# Patient Record
Sex: Female | Born: 1955 | Race: Black or African American | Hispanic: No
Health system: Southern US, Community
[De-identification: ages and names within clinical notes are randomized; demographics above are authoritative.]

## PROBLEM LIST (undated history)

## (undated) DIAGNOSIS — R768 Other specified abnormal immunological findings in serum: Secondary | ICD-10-CM

## (undated) DIAGNOSIS — F1010 Alcohol abuse, uncomplicated: Secondary | ICD-10-CM

## (undated) DIAGNOSIS — C801 Malignant (primary) neoplasm, unspecified: Secondary | ICD-10-CM

## (undated) DIAGNOSIS — H269 Unspecified cataract: Secondary | ICD-10-CM

## (undated) DIAGNOSIS — N189 Chronic kidney disease, unspecified: Secondary | ICD-10-CM

## (undated) DIAGNOSIS — I10 Essential (primary) hypertension: Secondary | ICD-10-CM

## (undated) DIAGNOSIS — K7030 Alcoholic cirrhosis of liver without ascites: Secondary | ICD-10-CM

## (undated) HISTORY — DX: Chronic kidney disease, unspecified: N18.9

## (undated) HISTORY — DX: Unspecified cataract: H26.9

## (undated) HISTORY — DX: Other specified abnormal immunological findings in serum: R76.8

## (undated) HISTORY — DX: Alcoholic cirrhosis of liver without ascites: K70.30

## (undated) HISTORY — DX: Malignant (primary) neoplasm, unspecified: C80.1

---

## 2019-08-02 ENCOUNTER — Other Ambulatory Visit: Payer: Self-pay

## 2019-08-02 ENCOUNTER — Observation Stay (HOSPITAL_COMMUNITY): Payer: Self-pay

## 2019-08-02 ENCOUNTER — Inpatient Hospital Stay (HOSPITAL_COMMUNITY)
Admission: EM | Admit: 2019-08-02 | Discharge: 2019-08-04 | DRG: 308 | Disposition: A | Discharge status: Home / Self Care | Payer: Self-pay | Attending: Internal Medicine | Admitting: Internal Medicine

## 2019-08-02 ENCOUNTER — Encounter (HOSPITAL_COMMUNITY): Payer: Self-pay

## 2019-08-02 ENCOUNTER — Emergency Department (HOSPITAL_COMMUNITY): Payer: Self-pay

## 2019-08-02 DIAGNOSIS — R945 Abnormal results of liver function studies: Secondary | ICD-10-CM

## 2019-08-02 DIAGNOSIS — Z20822 Contact with and (suspected) exposure to covid-19: Secondary | ICD-10-CM | POA: Diagnosis present

## 2019-08-02 DIAGNOSIS — R739 Hyperglycemia, unspecified: Secondary | ICD-10-CM | POA: Diagnosis present

## 2019-08-02 DIAGNOSIS — D696 Thrombocytopenia, unspecified: Secondary | ICD-10-CM | POA: Diagnosis present

## 2019-08-02 DIAGNOSIS — I10 Essential (primary) hypertension: Secondary | ICD-10-CM | POA: Diagnosis present

## 2019-08-02 DIAGNOSIS — B1920 Unspecified viral hepatitis C without hepatic coma: Secondary | ICD-10-CM | POA: Diagnosis present

## 2019-08-02 DIAGNOSIS — Z823 Family history of stroke: Secondary | ICD-10-CM

## 2019-08-02 DIAGNOSIS — K7031 Alcoholic cirrhosis of liver with ascites: Secondary | ICD-10-CM

## 2019-08-02 DIAGNOSIS — E162 Hypoglycemia, unspecified: Secondary | ICD-10-CM | POA: Diagnosis present

## 2019-08-02 DIAGNOSIS — F1010 Alcohol abuse, uncomplicated: Secondary | ICD-10-CM | POA: Diagnosis present

## 2019-08-02 DIAGNOSIS — Z9119 Patient's noncompliance with other medical treatment and regimen: Secondary | ICD-10-CM

## 2019-08-02 DIAGNOSIS — K920 Hematemesis: Secondary | ICD-10-CM

## 2019-08-02 DIAGNOSIS — R0602 Shortness of breath: Secondary | ICD-10-CM

## 2019-08-02 DIAGNOSIS — R791 Abnormal coagulation profile: Secondary | ICD-10-CM | POA: Diagnosis present

## 2019-08-02 DIAGNOSIS — E876 Hypokalemia: Secondary | ICD-10-CM | POA: Diagnosis present

## 2019-08-02 DIAGNOSIS — I480 Paroxysmal atrial fibrillation: Principal | ICD-10-CM | POA: Diagnosis present

## 2019-08-02 DIAGNOSIS — Z825 Family history of asthma and other chronic lower respiratory diseases: Secondary | ICD-10-CM

## 2019-08-02 DIAGNOSIS — Y907 Blood alcohol level of 200-239 mg/100 ml: Secondary | ICD-10-CM | POA: Diagnosis present

## 2019-08-02 DIAGNOSIS — E669 Obesity, unspecified: Secondary | ICD-10-CM | POA: Diagnosis present

## 2019-08-02 DIAGNOSIS — I4891 Unspecified atrial fibrillation: Secondary | ICD-10-CM | POA: Diagnosis present

## 2019-08-02 DIAGNOSIS — D7589 Other specified diseases of blood and blood-forming organs: Secondary | ICD-10-CM | POA: Diagnosis present

## 2019-08-02 DIAGNOSIS — K2211 Ulcer of esophagus with bleeding: Secondary | ICD-10-CM | POA: Diagnosis present

## 2019-08-02 DIAGNOSIS — R932 Abnormal findings on diagnostic imaging of liver and biliary tract: Secondary | ICD-10-CM

## 2019-08-02 DIAGNOSIS — Z91199 Patient's noncompliance with other medical treatment and regimen due to unspecified reason: Secondary | ICD-10-CM

## 2019-08-02 DIAGNOSIS — Z6836 Body mass index (BMI) 36.0-36.9, adult: Secondary | ICD-10-CM

## 2019-08-02 DIAGNOSIS — R203 Hyperesthesia: Secondary | ICD-10-CM | POA: Diagnosis present

## 2019-08-02 DIAGNOSIS — N179 Acute kidney failure, unspecified: Secondary | ICD-10-CM | POA: Diagnosis present

## 2019-08-02 DIAGNOSIS — K2210 Ulcer of esophagus without bleeding: Secondary | ICD-10-CM

## 2019-08-02 DIAGNOSIS — R160 Hepatomegaly, not elsewhere classified: Secondary | ICD-10-CM | POA: Diagnosis present

## 2019-08-02 DIAGNOSIS — R768 Other specified abnormal immunological findings in serum: Secondary | ICD-10-CM

## 2019-08-02 DIAGNOSIS — K7010 Alcoholic hepatitis without ascites: Secondary | ICD-10-CM | POA: Diagnosis present

## 2019-08-02 DIAGNOSIS — K2961 Other gastritis with bleeding: Secondary | ICD-10-CM | POA: Diagnosis present

## 2019-08-02 DIAGNOSIS — K2960 Other gastritis without bleeding: Secondary | ICD-10-CM

## 2019-08-02 DIAGNOSIS — F17210 Nicotine dependence, cigarettes, uncomplicated: Secondary | ICD-10-CM | POA: Diagnosis present

## 2019-08-02 DIAGNOSIS — Z801 Family history of malignant neoplasm of trachea, bronchus and lung: Secondary | ICD-10-CM

## 2019-08-02 DIAGNOSIS — R7989 Other specified abnormal findings of blood chemistry: Secondary | ICD-10-CM

## 2019-08-02 DIAGNOSIS — I248 Other forms of acute ischemic heart disease: Secondary | ICD-10-CM | POA: Diagnosis present

## 2019-08-02 DIAGNOSIS — R748 Abnormal levels of other serum enzymes: Secondary | ICD-10-CM

## 2019-08-02 DIAGNOSIS — K7030 Alcoholic cirrhosis of liver without ascites: Secondary | ICD-10-CM | POA: Diagnosis present

## 2019-08-02 DIAGNOSIS — K2981 Duodenitis with bleeding: Secondary | ICD-10-CM | POA: Diagnosis present

## 2019-08-02 HISTORY — DX: Essential (primary) hypertension: I10

## 2019-08-02 HISTORY — DX: Alcohol abuse, uncomplicated: F10.10

## 2019-08-02 HISTORY — DX: Unspecified atrial fibrillation: I48.91

## 2019-08-02 LAB — CBC
HCT: 43.7 % (ref 36.0–46.0)
HCT: 44 % (ref 36.0–46.0)
HCT: 36.9 % (ref 36.0–46.0)
Hemoglobin: 14.8 g/dL (ref 12.0–15.0)
Hemoglobin: 14.5 g/dL (ref 12.0–15.0)
Hemoglobin: 12.8 g/dL (ref 12.0–15.0)
MCH: 33.8 pg (ref 26.0–34.0)
MCH: 34.4 pg — ABNORMAL HIGH (ref 26.0–34.0)
MCH: 33.5 pg (ref 26.0–34.0)
MCHC: 33.9 g/dL (ref 30.0–36.0)
MCHC: 33 g/dL (ref 30.0–36.0)
MCHC: 34.7 g/dL (ref 30.0–36.0)
MCV: 99.8 fL (ref 80.0–100.0)
MCV: 104.5 fL — ABNORMAL HIGH (ref 80.0–100.0)
MCV: 96.6 fL (ref 80.0–100.0)
Platelets: DECREASED 10*3/uL (ref 150–400)
Platelets: 91 10*3/uL — ABNORMAL LOW (ref 150–400)
Platelets: 72 10*3/uL — ABNORMAL LOW (ref 150–400)
RBC: 4.38 MIL/uL (ref 3.87–5.11)
RBC: 4.21 MIL/uL (ref 3.87–5.11)
RBC: 3.82 MIL/uL — ABNORMAL LOW (ref 3.87–5.11)
RDW: 15.7 % — ABNORMAL HIGH (ref 11.5–15.5)
RDW: 16.5 % — ABNORMAL HIGH (ref 11.5–15.5)
RDW: 15.6 % — ABNORMAL HIGH (ref 11.5–15.5)
WBC: 6.8 10*3/uL (ref 4.0–10.5)
WBC: 5.5 10*3/uL (ref 4.0–10.5)
WBC: 6.3 10*3/uL (ref 4.0–10.5)
nRBC: 0 % (ref 0.0–0.2)
nRBC: 0 % (ref 0.0–0.2)
nRBC: 0 % (ref 0.0–0.2)

## 2019-08-02 LAB — CBG MONITORING, ED
Glucose-Capillary: 31 mg/dL — CL (ref 70–99)
Glucose-Capillary: 106 mg/dL — ABNORMAL HIGH (ref 70–99)
Glucose-Capillary: 161 mg/dL — ABNORMAL HIGH (ref 70–99)
Glucose-Capillary: 42 mg/dL — CL (ref 70–99)
Glucose-Capillary: 89 mg/dL (ref 70–99)

## 2019-08-02 LAB — DIFFERENTIAL
Abs Immature Granulocytes: 0.01 10*3/uL (ref 0.00–0.07)
Basophils Absolute: 0 10*3/uL (ref 0.0–0.1)
Basophils Relative: 0 %
Eosinophils Absolute: 0.1 10*3/uL (ref 0.0–0.5)
Eosinophils Relative: 1 %
Immature Granulocytes: 0 %
Lymphocytes Relative: 63 %
Lymphs Abs: 3.4 10*3/uL (ref 0.7–4.0)
Monocytes Absolute: 0.4 10*3/uL (ref 0.1–1.0)
Monocytes Relative: 8 %
Neutro Abs: 1.5 10*3/uL — ABNORMAL LOW (ref 1.7–7.7)
Neutrophils Relative %: 28 %

## 2019-08-02 LAB — MAGNESIUM: Magnesium: 2 mg/dL (ref 1.7–2.4)

## 2019-08-02 LAB — TROPONIN I (HIGH SENSITIVITY)
Troponin I (High Sensitivity): 33 ng/L — ABNORMAL HIGH (ref <18)
Troponin I (High Sensitivity): 50 ng/L — ABNORMAL HIGH (ref <18)

## 2019-08-02 LAB — HEPATIC FUNCTION PANEL
ALT: 122 U/L — ABNORMAL HIGH (ref 0–44)
AST: 220 U/L — ABNORMAL HIGH (ref 15–41)
Albumin: 3.3 g/dL — ABNORMAL LOW (ref 3.5–5.0)
Alkaline Phosphatase: 129 U/L — ABNORMAL HIGH (ref 38–126)
Bilirubin, Direct: 0.6 mg/dL — ABNORMAL HIGH (ref 0.0–0.2)
Indirect Bilirubin: 0.4 mg/dL (ref 0.3–0.9)
Total Bilirubin: 1 mg/dL (ref 0.3–1.2)
Total Protein: 7.1 g/dL (ref 6.5–8.1)

## 2019-08-02 LAB — BASIC METABOLIC PANEL
Anion gap: 20 — ABNORMAL HIGH (ref 5–15)
BUN: 15 mg/dL (ref 8–23)
CO2: 21 mmol/L — ABNORMAL LOW (ref 22–32)
Calcium: 9.4 mg/dL (ref 8.9–10.3)
Chloride: 100 mmol/L (ref 98–111)
Creatinine, Ser: 1.72 mg/dL — ABNORMAL HIGH (ref 0.44–1.00)
GFR calc Af Amer: 36 mL/min — ABNORMAL LOW (ref >60)
GFR calc non Af Amer: 31 mL/min — ABNORMAL LOW (ref >60)
Glucose, Bld: 44 mg/dL — CL (ref 70–99)
Potassium: 3 mmol/L — ABNORMAL LOW (ref 3.5–5.1)
Sodium: 141 mmol/L (ref 135–145)

## 2019-08-02 LAB — ECHOCARDIOGRAM COMPLETE
Height: 67 in
Weight: 3667.2 oz

## 2019-08-02 LAB — HEMOGLOBIN A1C
Hgb A1c MFr Bld: 5.6 % (ref 4.8–5.6)
Mean Plasma Glucose: 114.02 mg/dL

## 2019-08-02 LAB — PROTIME-INR
INR: 1.7 — ABNORMAL HIGH (ref 0.8–1.2)
Prothrombin Time: 19.8 seconds — ABNORMAL HIGH (ref 11.4–15.2)

## 2019-08-02 LAB — BRAIN NATRIURETIC PEPTIDE: B Natriuretic Peptide: 58.4 pg/mL (ref 0.0–100.0)

## 2019-08-02 LAB — RESPIRATORY PANEL BY RT PCR (FLU A&B, COVID)
Influenza A by PCR: NEGATIVE
Influenza B by PCR: NEGATIVE
SARS Coronavirus 2 by RT PCR: NEGATIVE

## 2019-08-02 LAB — HEPATITIS PANEL, ACUTE
HCV Ab: REACTIVE — AB
Hep A IgM: NONREACTIVE
Hep B C IgM: NONREACTIVE
Hepatitis B Surface Ag: NONREACTIVE

## 2019-08-02 LAB — ACETAMINOPHEN LEVEL: Acetaminophen (Tylenol), Serum: <10 ug/mL — ABNORMAL LOW (ref 10–30)

## 2019-08-02 LAB — TSH: TSH: 0.82 u[IU]/mL (ref 0.350–4.500)

## 2019-08-02 LAB — ETHANOL: Alcohol, Ethyl (B): 235 mg/dL — ABNORMAL HIGH (ref <10)

## 2019-08-02 LAB — HIV ANTIBODY (ROUTINE TESTING W REFLEX): HIV Screen 4th Generation wRfx: NONREACTIVE

## 2019-08-02 IMAGING — US US ABDOMEN LIMITED
1 series · 14 of 23 positions shown · non-contrast
Comparison: None.

CLINICAL DATA: Elevated LFTs

EXAM:
ULTRASOUND ABDOMEN LIMITED RIGHT UPPER QUADRANT

[Series 1: us abdomen limited ruq · 14 of 23 slices shown]
[im 1/23]
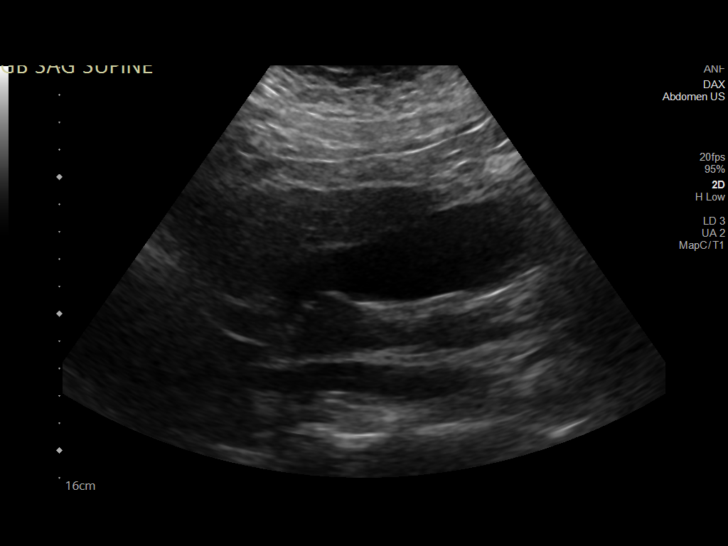
[im 3/23]
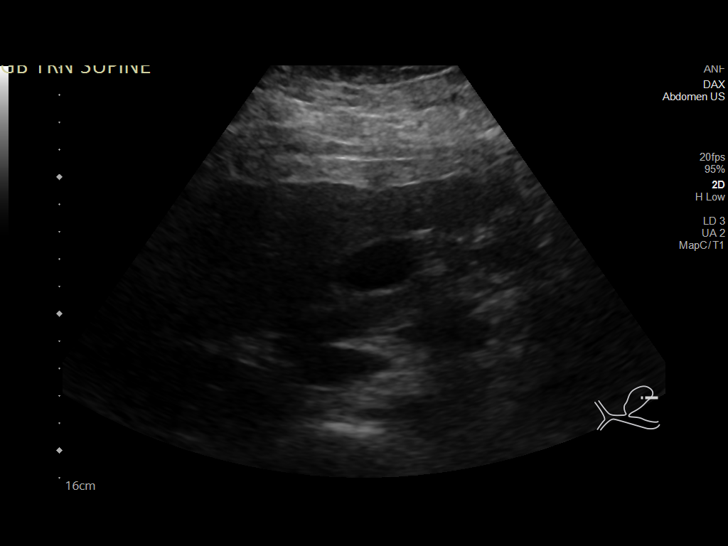
[im 5/23]
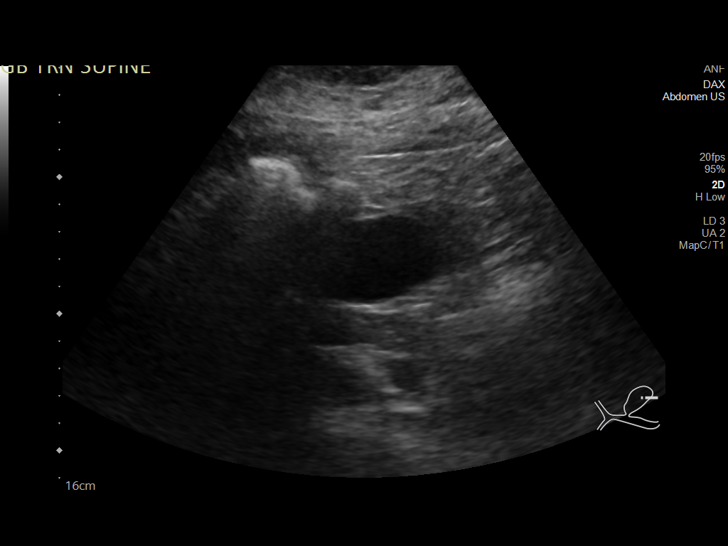
[im 6/23]
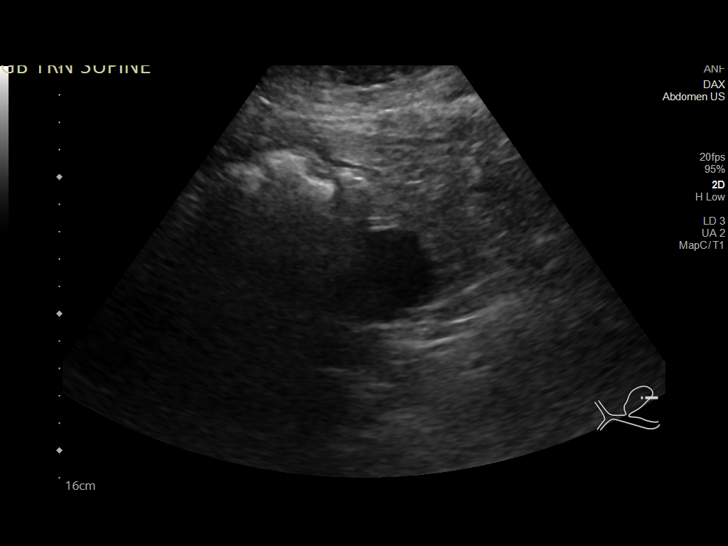
[im 8/23]
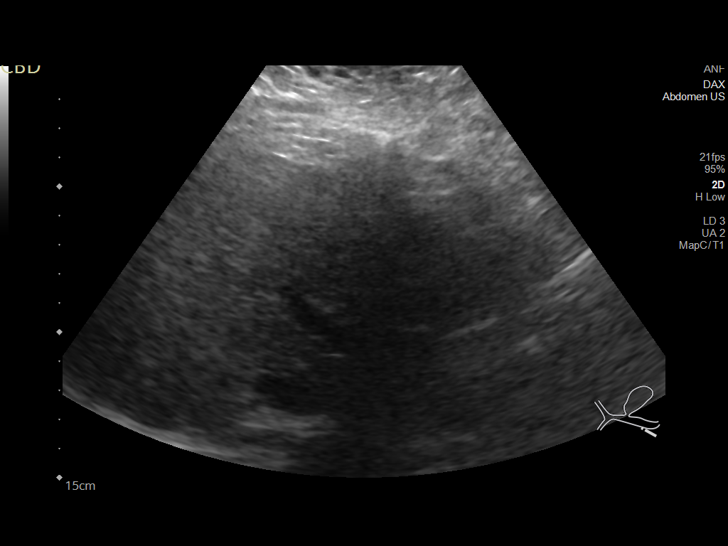
[im 10/23]
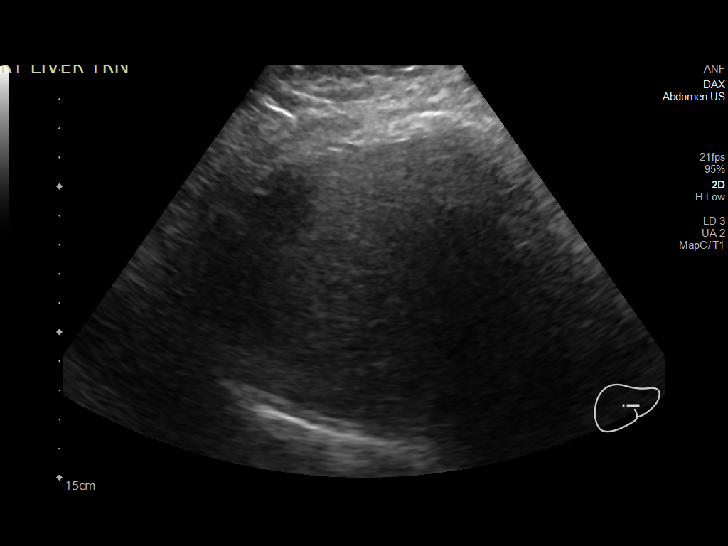
[im 11/23]
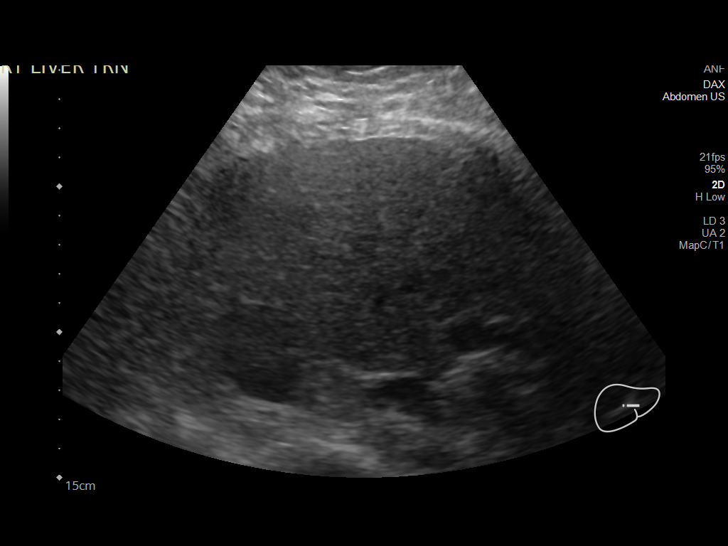
[im 13/23]
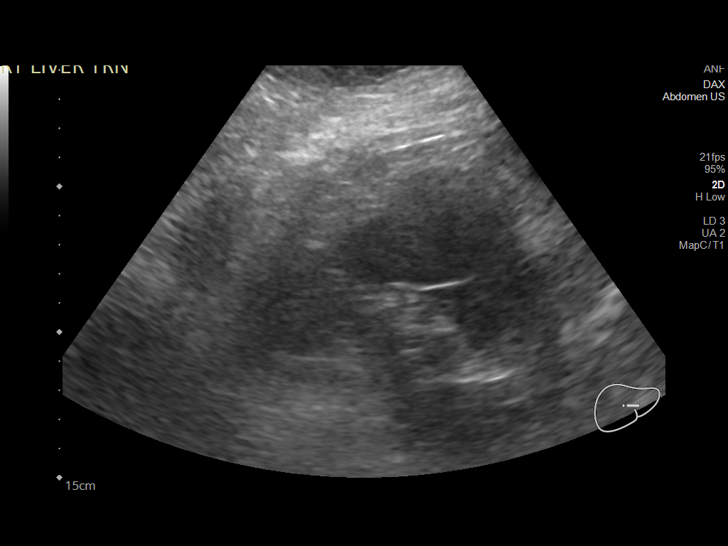
[im 14/23]
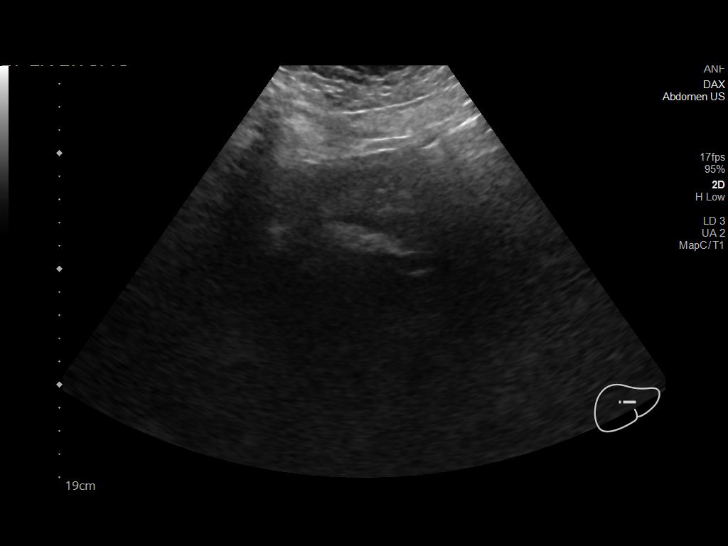
[im 16/23]
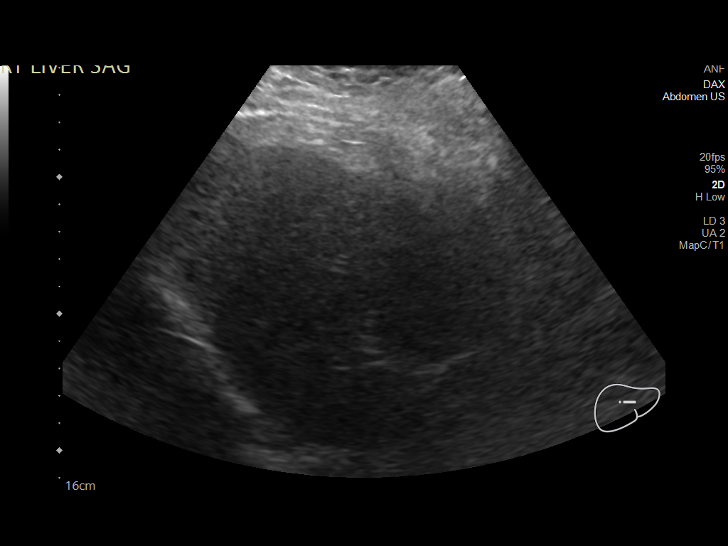
[im 18/23]
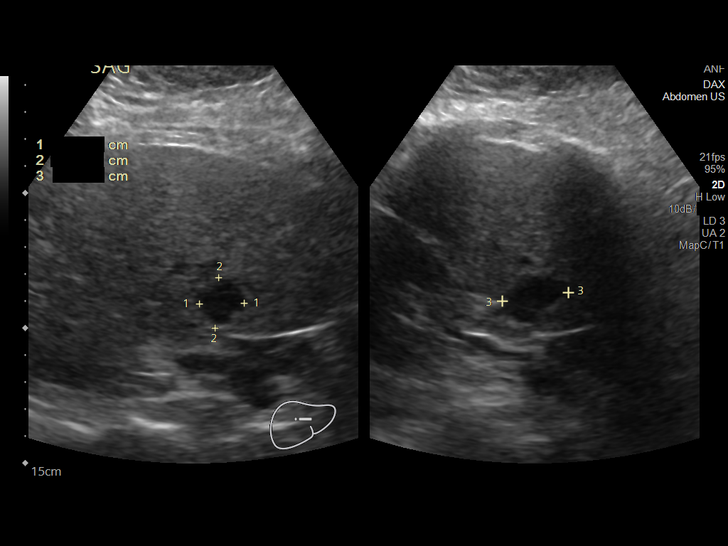
[im 19/23]
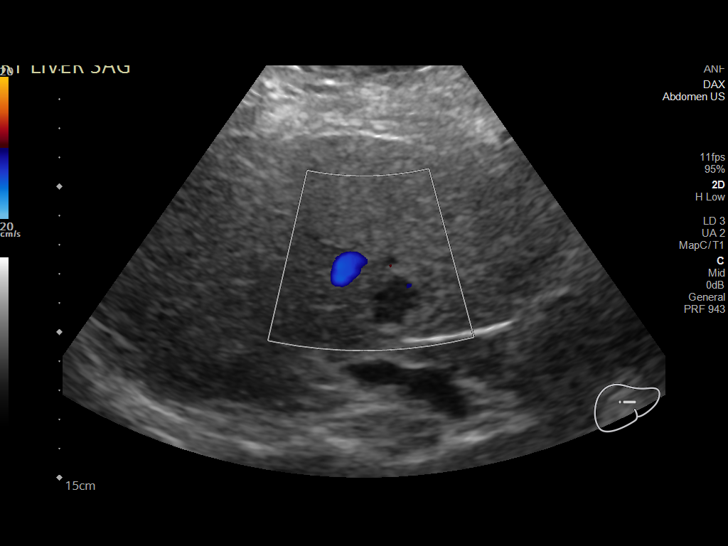
[im 21/23]
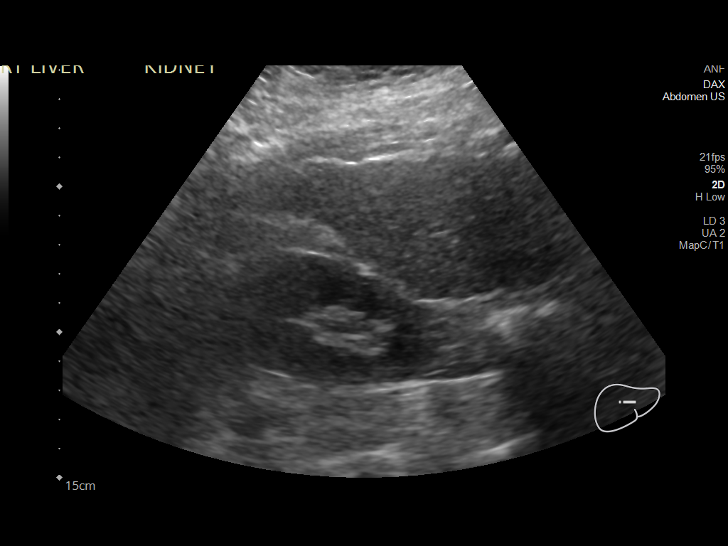
[im 23/23]
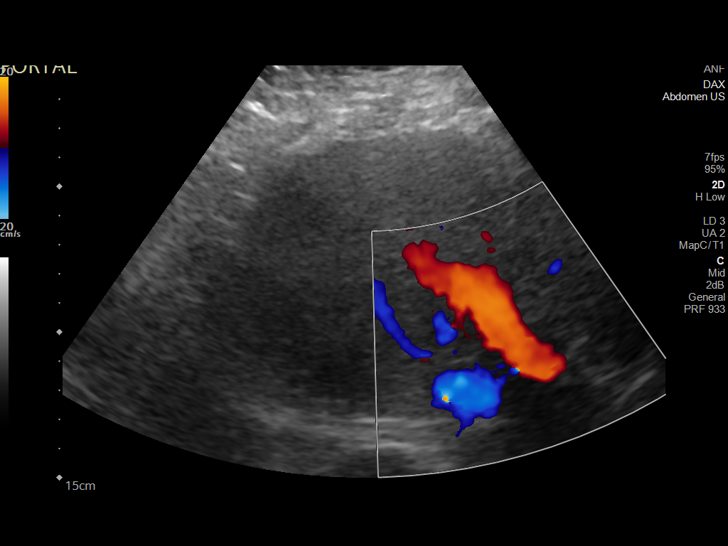

[14 of 23 positions shown; findings below may reference images not displayed]

FINDINGS: Gallbladder:

No gallstones or wall thickening visualized. No sonographic Murphy
sign noted by sonographer.

Common bile duct:

Diameter: Not visualized due to body habitus, difficulty positioning
the patient common overlying bowel gas

Liver:

Heterogeneous increased liver echogenicity. Central liver hypoechoic
indeterminate lesion noted measuring 1.7 x 1.9 x 2.5 cm
adjacent/anterior to the porta hepatis. This lesion may be cystic
but difficult to confirm with this suboptimal ultrasound. Liver
surface does appear nodular suggesting cirrhosis. No intrahepatic
biliary dilatation.

Portal vein is patent on color Doppler imaging with normal direction
of blood flow towards the liver.

Other: No ascites or free fluid. Included views of the right kidney
demonstrate no hydronephrosis.
IMPRESSION: No gallbladder abnormality, cholelithiasis or biliary obstruction

Suboptimal exam as above.  Underlying hepatic cirrhosis suspected.

2.5 cm indeterminate central hypoechoic hepatic lesion. See above
comment.

## 2019-08-02 MED ORDER — SODIUM CHLORIDE 0.9 % IV BOLUS
500.0000 mL | Freq: Once | INTRAVENOUS | Status: AC
  Administered 2019-08-02: 500 mL via INTRAVENOUS

## 2019-08-02 MED ORDER — PANTOPRAZOLE SODIUM 40 MG IV SOLR: 40.0000 mg | Freq: Two times a day (BID) | INTRAVENOUS | Status: DC

## 2019-08-02 MED ORDER — LORAZEPAM 2 MG/ML IJ SOLN
1.0000 mg | INTRAMUSCULAR | Status: DC | PRN
  Administered 2019-08-03: 1 mg via INTRAVENOUS
  Filled 2019-08-02: qty 1

## 2019-08-02 MED ORDER — DILTIAZEM HCL-DEXTROSE 125-5 MG/125ML-% IV SOLN (PREMIX)
5.0000 mg/h | INTRAVENOUS | Status: DC
  Administered 2019-08-02 – 2019-08-03 (×3): 5 mg/h via INTRAVENOUS
  Filled 2019-08-02 (×2): qty 125

## 2019-08-02 MED ORDER — ACETAMINOPHEN 325 MG PO TABS: 325.0000 mg | ORAL_TABLET | Freq: Four times a day (QID) | ORAL | Status: DC | PRN

## 2019-08-02 MED ORDER — LORAZEPAM 1 MG PO TABS: 1.0000 mg | ORAL_TABLET | ORAL | Status: DC | PRN

## 2019-08-02 MED ORDER — SODIUM CHLORIDE 0.9 % IV SOLN
8.0000 mg/h | INTRAVENOUS | Status: DC
  Administered 2019-08-02 – 2019-08-03 (×3): 8 mg/h via INTRAVENOUS
  Filled 2019-08-02 (×4): qty 80

## 2019-08-02 MED ORDER — ONDANSETRON HCL 4 MG PO TABS: 4.0000 mg | ORAL_TABLET | Freq: Four times a day (QID) | ORAL | Status: DC | PRN

## 2019-08-02 MED ORDER — OCTREOTIDE LOAD VIA INFUSION
50.0000 ug | Freq: Once | INTRAVENOUS | Status: AC
  Administered 2019-08-02: 50 ug via INTRAVENOUS
  Filled 2019-08-02: qty 25

## 2019-08-02 MED ORDER — HEPARIN BOLUS VIA INFUSION
4000.0000 [IU] | Freq: Once | INTRAVENOUS | Status: AC
  Administered 2019-08-02: 4000 [IU] via INTRAVENOUS
  Filled 2019-08-02: qty 4000

## 2019-08-02 MED ORDER — SODIUM CHLORIDE 0.9 % IV SOLN
1.0000 g | INTRAVENOUS | Status: DC
  Administered 2019-08-02: 1 g via INTRAVENOUS
  Filled 2019-08-02: qty 10

## 2019-08-02 MED ORDER — KCL IN DEXTROSE-NACL 40-5-0.9 MEQ/L-%-% IV SOLN
INTRAVENOUS | Status: DC
  Filled 2019-08-02 (×3): qty 1000

## 2019-08-02 MED ORDER — PANTOPRAZOLE SODIUM 40 MG IV SOLR
40.0000 mg | Freq: Once | INTRAVENOUS | Status: AC
  Administered 2019-08-02: 40 mg via INTRAVENOUS
  Filled 2019-08-02: qty 40

## 2019-08-02 MED ORDER — DEXTROSE 50 % IV SOLN
INTRAVENOUS | Status: AC
  Administered 2019-08-02: 50 mL
  Filled 2019-08-02: qty 50

## 2019-08-02 MED ORDER — THIAMINE HCL 100 MG PO TABS
100.0000 mg | ORAL_TABLET | Freq: Every day | ORAL | Status: DC
  Administered 2019-08-03 – 2019-08-04 (×2): 100 mg via ORAL
  Filled 2019-08-02 (×2): qty 1

## 2019-08-02 MED ORDER — FOLIC ACID 1 MG PO TABS
1.0000 mg | ORAL_TABLET | Freq: Every day | ORAL | Status: DC
  Administered 2019-08-03 – 2019-08-04 (×2): 1 mg via ORAL
  Filled 2019-08-02 (×2): qty 1

## 2019-08-02 MED ORDER — HEPARIN (PORCINE) 25000 UT/250ML-% IV SOLN
1200.0000 [IU]/h | INTRAVENOUS | Status: DC
  Administered 2019-08-02: 1200 [IU]/h via INTRAVENOUS
  Filled 2019-08-02: qty 250

## 2019-08-02 MED ORDER — DEXTROSE 50 % IV SOLN
25.0000 g | Freq: Once | INTRAVENOUS | Status: AC
  Administered 2019-08-02: 25 g via INTRAVENOUS
  Filled 2019-08-02: qty 50

## 2019-08-02 MED ORDER — SODIUM CHLORIDE 0.9% FLUSH
3.0000 mL | Freq: Two times a day (BID) | INTRAVENOUS | Status: DC
  Administered 2019-08-02: 3 mL via INTRAVENOUS

## 2019-08-02 MED ORDER — SODIUM CHLORIDE 0.9 % IV BOLUS (SEPSIS)
500.0000 mL | Freq: Once | INTRAVENOUS | Status: AC
  Administered 2019-08-02: 500 mL via INTRAVENOUS

## 2019-08-02 MED ORDER — MAGNESIUM SULFATE 2 GM/50ML IV SOLN
2.0000 g | Freq: Once | INTRAVENOUS | Status: AC
  Administered 2019-08-02: 2 g via INTRAVENOUS
  Filled 2019-08-02: qty 50

## 2019-08-02 MED ORDER — SODIUM CHLORIDE 0.9% FLUSH
3.0000 mL | Freq: Once | INTRAVENOUS | Status: AC
  Administered 2019-08-02: 3 mL via INTRAVENOUS

## 2019-08-02 MED ORDER — DILTIAZEM LOAD VIA INFUSION
10.0000 mg | Freq: Once | INTRAVENOUS | Status: AC
  Administered 2019-08-02: 10 mg via INTRAVENOUS
  Filled 2019-08-02: qty 10

## 2019-08-02 MED ORDER — ONDANSETRON HCL 4 MG/2ML IJ SOLN
4.0000 mg | Freq: Four times a day (QID) | INTRAMUSCULAR | Status: DC | PRN
  Filled 2019-08-02: qty 2

## 2019-08-02 MED ORDER — SENNOSIDES-DOCUSATE SODIUM 8.6-50 MG PO TABS: 1.0000 | ORAL_TABLET | Freq: Every evening | ORAL | Status: DC | PRN

## 2019-08-02 MED ORDER — ACETAMINOPHEN 650 MG RE SUPP: 325.0000 mg | Freq: Four times a day (QID) | RECTAL | Status: DC | PRN

## 2019-08-02 MED ORDER — POTASSIUM CHLORIDE CRYS ER 20 MEQ PO TBCR
40.0000 meq | EXTENDED_RELEASE_TABLET | Freq: Once | ORAL | Status: AC
  Administered 2019-08-02: 40 meq via ORAL
  Filled 2019-08-02: qty 2

## 2019-08-02 MED ORDER — SODIUM CHLORIDE 0.9 % IV SOLN
50.0000 ug/h | INTRAVENOUS | Status: DC
  Administered 2019-08-02 – 2019-08-03 (×3): 50 ug/h via INTRAVENOUS
  Filled 2019-08-02 (×2): qty 1

## 2019-08-02 MED ORDER — POTASSIUM CHLORIDE 10 MEQ/100ML IV SOLN
10.0000 meq | Freq: Once | INTRAVENOUS | Status: AC
  Administered 2019-08-02: 10 meq via INTRAVENOUS
  Filled 2019-08-02: qty 100

## 2019-08-02 MED ORDER — ADULT MULTIVITAMIN W/MINERALS CH
1.0000 | ORAL_TABLET | Freq: Every day | ORAL | Status: DC
  Administered 2019-08-03 – 2019-08-04 (×2): 1 via ORAL
  Filled 2019-08-02 (×2): qty 1

## 2019-08-02 NOTE — ED Notes (Signed)
500 ml of nss bolus at present

## 2019-08-02 NOTE — ED Triage Notes (Signed)
Pt arrives to ED with c/o of "I can't breathe, I can't catch my breath";  Pt stated SOB started 6 days ago; pt is from [facility] and just arrived to [place] 2 days ago visiting daughter:Pt denies being around Covid person but she is unsure;  Pt was not forth coming with information and was having some flight of ideas at triage; Pt went from one subject to another; Pt c/o chest pain after questioning a few times; Pt is tearful at [place] and was difficult to get answers from at [facility]

## 2019-08-02 NOTE — Progress Notes (Addendum)
After reviewing the cardiology consult note, we discovered that the patient had vomited blood while in the ED after we initially evaluated the patient.  The primary team did not receive a page, and were not notified about this event.  We have discontinued the heparin and made the patient NPO. Starting octreotide. Will reach out to GI for further recommendations.  ADDENDUM: Spoke with GI, they recommend starting ceftriaxone in addition to octreotide. They will come see the patient as well.  Earlene Plater, MD Internal Medicine, PGY1 Pager: [phone]  08/02/2019,4:31 PM

## 2019-08-02 NOTE — Hospital Course (Signed)
In summary, Connie [name] is a 64 year old female with history of untreated hypertension, tobacco use and suspected EtOH use disorder who presents with SOB, chest pain and palpitations.  She was noted to be in A. fib with RVR. On initial labs, it was noted that she had an elevated EtOH, transaminitis, and elevated PT/INR, raising concerns for hepatic injury secondary to alcohol use. Pt responded well to cardizem drip and returned to sinus rhythm.

## 2019-08-02 NOTE — Progress Notes (Signed)
*  PRELIMINARY RESULTS* Echocardiogram 2D Echocardiogram has been performed.  Connie [name] 08/02/2019, 3:01 PM

## 2019-08-02 NOTE — ED Notes (Signed)
Connie [name] [phone] would like call when she is able to come back with patient

## 2019-08-02 NOTE — Consult Note (Addendum)
Consultation  Referring Provider:     Dr. Jerl Santos Primary Care Physician:  Patient, No Pcp Per Primary Gastroenterologist:       None - unassigned - visiting from [facility]  Reason for Consultation:     hematemesis     Impression / Plan:   Hematemesis  Cirrhosis - EtOH, ? 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  mildl INR elevation  AKI  Hypoglycemia  ++++++  Agree w/ PPI and octreotide  You have added CTX prophylaxis as suggested  Assuming no withdrawal or other issues will plan for EGD tomorrow - sooner if clinical course suggests  Serial Hgb  Agree no anti-coag Tx  Do not use steroids - discriminant fx is not high enough and she has cirrhosis not alcoholic hepatitis  Leave NPO  IVF ordered  Agree w/ w/d protocol - no hx of this but possible  Will need other liver imaging due to lesion - CT vs MR   Gatha Mayer, MD, [facility] 08/02/2019 6:14 PM           HPI:   Connie [name] is a 64 y.o. female admitted with Afib and RVR - then had hematemesis after starting heparin infusion. + EtOH level on admit at 235. Heparin was turned off. She drinks heavily at times at least and since admit we know [place] looks like cirrhoitic liver, PLT's low and INR out a bit.  No prior hx hematemeis. Denies NSAIDS Non-adherent to medical tx for htn "I do not like doctors or needles" No prior GI w/u  Resting comfortably now. Here in town to visit daughter, grandkids and greatgtrandchild. Says she has a stressful marriage  Some left chest pain and tenderness  Glucose was 44 - Txed  No family hx liver dz  Past Medical History:  Diagnosis Date  . Alcohol abuse   . Hypertension     History reviewed. No  pertinent surgical history.  Family History  Problem Relation Age of Onset  . Lung cancer Mother      Social History   Tobacco Use  . Smoking status: Current Every Day Smoker  Substance Use Topics  . Alcohol use: Yes  . Drug use: Not on file    Prior to Admission medications   Not on File    Current Facility-Administered Medications  Medication Dose Route Frequency Provider Last Rate Last Admin  . acetaminophen (TYLENOL) tablet 325 mg  325 mg Oral Q6H PRN Masoudi, Elhamalsadat, MD       Or  . acetaminophen (TYLENOL) suppository 325 mg  325 mg Rectal Q6H PRN Masoudi, Elhamalsadat, MD      . cefTRIAXone (ROCEPHIN) 1 g in sodium chloride 0.9 % 100 mL IVPB  1 g Intravenous Q24H Earlene Plater, MD      . diltiazem (CARDIZEM) 125 mg in dextrose 5% 125 mL (1 mg/mL) infusion  5-15 mg/hr Intravenous Continuous Charlesetta Shanks, MD 10 mL/hr at 08/02/19 1311 10 mg/hr at 08/02/19 1311  . folic acid (FOLVITE) tablet 1 mg  1 mg Oral Daily Masoudi, Elhamalsadat, MD      . LORazepam (ATIVAN) tablet 1-4 mg  1-4 mg Oral Q1H PRN Dewayne Hatch, MD  Or  . LORazepam (ATIVAN) injection 1-4 mg  1-4 mg Intravenous Q1H PRN Masoudi, Elhamalsadat, MD      . multivitamin with minerals tablet 1 tablet  1 tablet Oral Daily Masoudi, Elhamalsadat, MD      . octreotide (SANDOSTATIN) 2 mcg/mL load via infusion 50 mcg  50 mcg Intravenous Once Earlene Plater, MD       And  . octreotide (SANDOSTATIN) 500 mcg in sodium chloride 0.9 % 250 mL (2 mcg/mL) infusion  50 mcg/hr Intravenous Continuous Earlene Plater, MD      . ondansetron [facility]) tablet 4 mg  4 mg Oral Q6H PRN Masoudi, Elhamalsadat, MD       Or  . ondansetron (ZOFRAN) injection 4 mg  4 mg Intravenous Q6H PRN Masoudi, Elhamalsadat, MD      . pantoprazole (PROTONIX) 80 mg in sodium chloride 0.9 % 100 mL (0.8 mg/mL) infusion  8 mg/hr Intravenous Continuous Charlesetta Shanks, MD 10 mL/hr at 08/02/19 1228 8 mg/hr at 08/02/19 1228  . [START ON  08/06/2019] pantoprazole (PROTONIX) injection 40 mg  40 mg Intravenous Q12H Pfeiffer, Jeannie Done, MD      . senna-docusate (Senokot-S) tablet 1 tablet  1 tablet Oral QHS PRN Masoudi, Elhamalsadat, MD      . sodium chloride flush (NS) 0.9 % injection 3 mL  3 mL Intravenous Q12H Masoudi, Elhamalsadat, MD      . thiamine tablet 100 mg  100 mg Oral Daily Masoudi, Elhamalsadat, MD        Allergies as of 08/02/2019  . (No Known Allergies)     Review of Systems:    This is positive for those things mentioned in the HPI, also positive for palpitations and dyspnea    Physical Exam:  Vital signs in last 24 hours: Temp:  [97.6 F (36.4 C)-99.7 F (37.6 C)] 99.7 F (37.6 C) (04/03 1702) Pulse Rate:  [71-159] 82 (04/03 1643) Resp:  [13-23] 20 (04/03 1643) BP: (85-160)/(41-113) 127/57 (04/03 1643) SpO2:  [90 %-100 %] 94 % (04/03 1643) Weight:  [89.4 kg-104 kg] 104 kg (04/03 1343) Last BM Date: 08/02/19  General:  Well-developed, well-nourished and in no acute distress Eyes:  anicteric. Lungs: Clear to auscultation bilaterally. Heart:   S1S2, no rubs, murmurs, gallops. Abdomen:  mod obesesoft, non-tender, no hepatosplenomegaly, hernia, or mass and BS+.  Extremities:  Trace bilat edema Skin   no rash. Neuro:  A&O x 3. No asterixis Psych:  appropriate mood and  Affect.   Data Reviewed:   LAB RESULTS: Recent Labs    08/02/19 0638 08/02/19 0749  WBC 6.8 5.5  HGB 14.8 14.5  HCT 43.7 44.0  PLT PLATELET CLUMPS NOTED ON SMEAR, COUNT APPEARS DECREASED 91*   BMET Recent Labs    08/02/19 0638  NA 141  K 3.0*  CL 100  CO2 21*  GLUCOSE 44*  BUN 15  CREATININE 1.72*  CALCIUM 9.4   LFT Recent Labs    08/02/19 0749  PROT 7.1  ALBUMIN 3.3*  AST 220*  ALT 122*  ALKPHOS 129*  BILITOT 1.0  BILIDIR 0.6*  IBILI 0.4   PT/INR Recent Labs    08/02/19 0749  LABPROT 19.8*  INR 1.7*   Lab Results  Component Value Date   HEPAIGM NON REACTIVE 08/02/2019   NON REACTIVE (04/03  0749) HBSAg Lab Results  Component Value Date   HCVAB Reactive (A) 08/02/2019   Lab Results  Component Value Date   TSH 0.820 08/02/2019  STUDIES: DG Chest Port 1 View  Result Date: 08/02/2019 CLINICAL DATA:  Chest pain and shortness of breath for 1 day EXAM: PORTABLE CHEST 1 VIEW COMPARISON:  None. FINDINGS: Generous heart size but within normal limits for portable technique. Unremarkable aortic contours. There is no edema, consolidation, effusion, or pneumothorax. IMPRESSION: Negative portable chest. Electronically Signed   By: Monte Fantasia M.D.   On: 08/02/2019 07:30   ECHOCARDIOGRAM COMPLETE  Result Date: 08/02/2019    ECHOCARDIOGRAM REPORT   Patient Name:   Connie [name] Date of Exam: 08/02/2019 Medical Rec #:  [id number]      Height:       67.0 in Accession #:    [id number]     Weight:       229.2 lb Date of Birth:  Nov 16, 1955      BSA:          2.143 m Patient Age:    47 years       BP:           126/51 mmHg Patient Gender: F              HR:           77 bpm. Exam Location:  Inpatient Procedure: 2D Echo Indications:    Dyspnea 786.09 / R06.00  History:        Patient has no prior history of Echocardiogram examinations.                 Arrythmias:Atrial Fibrillation; Risk Factors:Current Smoker and                 Hypertension. ETOH.  Sonographer:    Leavy Cella Referring Phys: [id number] [place]  1. Left ventricular ejection fraction, by estimation, is 65 to 70%. The left ventricle has hyperdynamic function. The left ventricle has no regional wall motion abnormalities. Left ventricular diastolic parameters were normal.  2. Right ventricular systolic function is normal. The right ventricular size is normal.  3. Left atrial size was mildly dilated.  4. The mitral valve is normal in structure. No evidence of mitral valve regurgitation. No evidence of mitral stenosis.  5. The aortic valve is normal in structure. Aortic valve regurgitation is not visualized. No aortic  stenosis is present.  6. The inferior vena cava is normal in size with greater than 50% respiratory variability, suggesting right atrial pressure of 3 mmHg. FINDINGS  Left Ventricle: Left ventricular ejection fraction, by estimation, is 65 to 70%. The left ventricle has hyperdynamic function. The left ventricle has no regional wall motion abnormalities. The left ventricular internal cavity size was normal in size. There is no concentric left ventricular hypertrophy. Left ventricular diastolic parameters were normal. Normal left ventricular filling pressure. Right Ventricle: The right ventricular size is normal. No increase in right ventricular wall thickness. Right ventricular systolic function is normal. Left Atrium: Left atrial size was mildly dilated. Right Atrium: Right atrial size was normal in size. Pericardium: There is no evidence of pericardial effusion. Mitral Valve: The mitral valve is normal in structure. Normal mobility of the mitral valve leaflets. No evidence of mitral valve regurgitation. No evidence of mitral valve stenosis. Tricuspid Valve: The tricuspid valve is normal in structure. Tricuspid valve regurgitation is not demonstrated. No evidence of tricuspid stenosis. Aortic Valve: The aortic valve is normal in structure. Aortic valve regurgitation is not visualized. No aortic stenosis is present. Aortic valve mean gradient measures 11.0 mmHg. Aortic valve peak gradient measures 20.3 mmHg.  Aortic valve area, by VTI measures 2.09 cm. Pulmonic Valve: The pulmonic valve was normal in structure. Pulmonic valve regurgitation is not visualized. No evidence of pulmonic stenosis. Aorta: The aortic root is normal in size and structure. Venous: The inferior vena cava is normal in size with greater than 50% respiratory variability, suggesting right atrial pressure of 3 mmHg. IAS/Shunts: No atrial level shunt detected by color flow Doppler.  LEFT VENTRICLE PLAX 2D LVIDd:         4.70 cm  Diastology LVIDs:          2.50 cm  LV e' lateral:   12.10 cm/s LV PW:         1.20 cm  LV E/e' lateral: 7.2 LV IVS:        1.10 cm  LV e' medial:    9.25 cm/s LVOT diam:     2.18 cm  LV E/e' medial:  9.4 LV SV:         88 LV SV Index:   41 LVOT Area:     3.73 cm  RIGHT VENTRICLE RV S prime:     24.40 cm/s TAPSE (M-mode): 3.2 cm LEFT ATRIUM             Index       RIGHT ATRIUM           Index LA diam:        4.00 cm 1.87 cm/m  RA Area:     14.00 cm LA Vol (A2C):   62.9 ml 29.35 ml/m RA Volume:   34.40 ml  16.05 ml/m LA Vol (A4C):   63.9 ml 29.82 ml/m LA Biplane Vol: 69.1 ml 32.25 ml/m  AORTIC VALVE AV Area (Vmax):    2.17 cm AV Area (Vmean):   2.17 cm AV Area (VTI):     2.09 cm AV Vmax:           225.32 cm/s AV Vmean:          149.005 cm/s AV VTI:            0.420 m AV Peak Grad:      20.3 mmHg AV Mean Grad:      11.0 mmHg LVOT Vmax:         131.27 cm/s LVOT Vmean:        86.699 cm/s LVOT VTI:          0.235 m LVOT/AV VTI ratio: 0.56  AORTA Ao Root diam: 2.60 cm MITRAL VALVE MV Area (PHT): 3.03 cm    SHUNTS MV Decel Time: 250 msec    Systemic VTI:  0.24 m MV E velocity: 87.40 cm/s  Systemic Diam: 2.18 cm MV A velocity: 47.60 cm/s MV E/A ratio:  1.84 Mihai Croitoru MD Electronically signed by Sanda Klein MD Signature Date/Time: 08/02/2019/3:14:50 PM    Final    US Abdomen Limited RUQ  Result Date: 08/02/2019 CLINICAL DATA:  Elevated LFTs EXAM: ULTRASOUND ABDOMEN LIMITED RIGHT UPPER QUADRANT COMPARISON:  None. FINDINGS: Gallbladder: No gallstones or wall thickening visualized. No sonographic Murphy sign noted by sonographer. Common bile duct: Diameter: Not visualized due to body habitus, difficulty positioning the patient common overlying bowel gas Liver: Heterogeneous increased liver echogenicity. Central liver hypoechoic indeterminate lesion noted measuring 1.7 x 1.9 x 2.5 cm adjacent/anterior to the porta hepatis. This lesion may be cystic but difficult to confirm with this suboptimal ultrasound. Liver surface does appear  nodular suggesting cirrhosis. No intrahepatic biliary dilatation. Portal vein is patent on color  Doppler imaging with normal direction of blood flow towards the liver. Other: No ascites or free fluid. Included views of the right kidney demonstrate no hydronephrosis. IMPRESSION: No gallbladder abnormality, cholelithiasis or biliary obstruction Suboptimal exam as above.  Underlying hepatic cirrhosis suspected. 2.5 cm indeterminate central hypoechoic hepatic lesion. See above comment. Electronically Signed   By: Jerilynn Mages.  Shick M.D.   On: 08/02/2019 11:31      Thanks   LOS: 0 days   @Clorine Swing  Simonne Maffucci, MD, [facility] @  08/02/2019, 6:11 PM

## 2019-08-02 NOTE — ED Notes (Signed)
April Oakley made aware of CBG

## 2019-08-02 NOTE — ED Notes (Signed)
Pt provided with turkey sandwich.

## 2019-08-02 NOTE — ED Notes (Signed)
Pt has a heart rate of 145 irregular af  Some sob  Nasal 02 with 2 liters  Placed  A and o x4

## 2019-08-02 NOTE — H&P (Addendum)
Date: 08/02/2019               Patient Name:  Connie [name] MRN: [id number]  DOB: 03/21/1956 Age / Sex: 64 y.o., female   PCP: Patient, No Pcp Per         Medical Service: Internal Medicine Teaching Service         Attending Physician: Dr. Charlesetta Shanks, MD    First Contact: Sheppard Coil, MD, Mitzi Hansen Pager: [place] [phone])  Second Contact: Myrtie Hawk, MD, [place] Pager: EM [phone])       After Hours (After 5p/  First Contact Pager: [phone]  weekends / holidays): Second Contact Pager: [phone]   Chief Complaint: SOB  History of Present Illness: [name] is a 64 year old female with a history of untreated hypertension, obesity who presents with SOB, palpitations and chest pain.  She states that she has been having issues with shortness of breath and palpitations for the last 6 days.  She states that she works in [employer], and noticed whenever she walked fast around the store her palpitations and chest pain got worse.  She describes the chest discomfort as a pressure and pain that radiates to her jaw and left arm.  He also nauseous and sweaty. The pain subsides upon resting and she is typically not in pain when she is at home. She states that she sleeps with 2-3 pillows at night and frequently wakes up in the middle of the night gasping for air. She states laying flat would "kill her".  She also states that she wakes up frequently at night to urinate.  Denies dysuria. She denies having any fevers, cough, vomiting, or changes in her bowel habits.  She also notes that her ankles were very swollen a couple days ago to the point where she could not put her flip-flops on.    Of note, the patient is originally from [place], but flew into [place] to visit her daughter who she is currently staying with.  She arrived 2 days ago.  Patient states that she was diagnosed with hypertension 10 years ago, but not taking her medications.  She is afraid of doctors.  In the ED, CBC, CMP,  troponins, EtOH level, and TSH were obtained.  BNP UDS and urinalysis were ordered as well.  Notable lab findings include hypoglycemia to 31, slightly elevated troponin of 33 and 50, transaminitis with an AST/ALT of 220/122, and an elevated PT and INR to 1.7 and 19.8 respectively. EtOH was elevated at 235.  Hypokalemic to 3.0. The patient was also noted to be in A. fib with RVR upon admission with tachycardia to the 130s. CXR unremarkable. She was started on a diltiazem drip and IV heparin. Patient was alsogiven some potassium, magnesium and 1 L of IV fluids.  Meds:  No outpatient medications have been marked as taking for the 08/02/19 encounter [facility] Encounter).   Allergies: No known drug allergies  Past Medical History:  Diagnosis Date  . Alcohol abuse   . Hypertension    History reviewed. No pertinent surgical history.  Family History:  Family History  Problem Relation Age of Onset  . Cancer Mother   -Mother-lung cancer and emphysema -3 aunts with CVA -2 uncles had heart attacks  Social History:  Social History   Tobacco Use  . Smoking status: Current Every Day Smoker  Substance Use Topics  . Alcohol use: Yes  . Drug use: Not on file  -Currently smokes 1/3 pack a day -Patient states  she drinks alcohol on days she does not work.  She states that she drank alcohol last night but could not quantify.  States that she may have had more than 5 drinks. -Denies any recreational drug use -Lives in [place] with her ex-husband, who used to be in the [employer].  States living with him is very stressful.   Review of Systems: A complete ROS was negative except as per HPI.   Imaging: EKG: A. fib with RVR.  Prominent Q waves in leads II, III and aVF suggestive of prior myocardial injury.  CXR:  IMPRESSION: Negative portable chest.  Physical Exam: Blood pressure (!) 160/81, pulse 87, temperature 97.6 F (36.4 C), temperature source Oral, resp. rate 17, height 5' 6.5" (1.689  m), weight 89.4 kg, SpO2 99 %.  Physical Exam Vitals reviewed.  Constitutional:      General: She is not in acute distress.    Appearance: She is obese. She is not ill-appearing, toxic-appearing or diaphoretic.  HENT:     Head: Normocephalic and atraumatic.  Eyes:     Extraocular Movements: Extraocular movements intact.  Neck:     Vascular: Hepatojugular reflux (Mild) present. No JVD.  Cardiovascular:     Rate and Rhythm: Normal rate and regular rhythm.  No extrasystoles are present.    Pulses: Normal pulses.     Heart sounds: No murmur. No friction rub. No gallop.   Pulmonary:     Effort: Pulmonary effort is normal. No accessory muscle usage or respiratory distress.     Breath sounds: Normal breath sounds. No decreased breath sounds, wheezing, rhonchi or rales.  Chest:     Chest wall: No tenderness.  Abdominal:     General: Bowel sounds are normal.     Palpations: Abdomen is soft. There is no hepatomegaly or mass.     Tenderness: There is no guarding.  Musculoskeletal:     Right lower leg: Edema (trace) present.     Left lower leg: Edema (trace) present.  Skin:    General: Skin is warm.  Neurological:     General: No focal deficit present.     Mental Status: She is alert and oriented to person, place, and time.  Psychiatric:        Mood and Affect: Mood normal.    Assessment & Plan by Problem: Active Problems:   Atrial fibrillation with RVR ([place])  In summary, [name] is a 64 year old female with history of untreated hypertension, tobacco use and suspected EtOH use disorder who presents with SOB, chest pain and palpitations and hematemesis.  She was noted to be in A. fib with RVR. On initial labs, it was noted that she had an elevated EtOH, transaminitis, and elevated PT/INR, raising concerns for hepatic injury secondary to alcohol use. Hematemesis concerning for bleeding esophageal varices.  #A-fib w/ RVR #Chest Pain #SOB #Troponemia: Patient presented with A. fib  with RVR with heart rate in the 130s.  She was also experiencing S OB and palpitations. Patient was in normal sinus rhythm while on the diltiazem drip on her initial evaluation. Cardiology is on board.  Troponinemia to 33 and 50 on initial lab, likely demand ischemia in the setting of A. Fib. CHADSVASC = 3 and should be anticoagulated long term. -Cardiology is consulted, will appreciate recommendations   -Echocardiogram ordered  -Trend troponins  -Diltiazem drip  -Hold heparin for now  -May benefit from ischemic evaluation once rate controlled -Avoid amiodarone due to transaminitis -Telemetry -F/u BNP   #  EtOH use disorder #Transaminitis  #Elevated PT/INR: EtOH elevated to 235 today.  Patient states that she drank alcohol last night but did not quantify how many drinks she had.  States probably more than 5.  Labs indicate hepatic injury, with transaminitis (AST 220, ALT 122) and elevated PT and INR to 19.8 and 1.7 respectively.  I suspect this is secondary to excessive alcohol use, but will rule out other causes. Maddrey's Discriminant Function score of Alcohol Hepatitis elevated, and patient would likely benefit from glucocorticoid tx once infectious causes are ruled out. -Right upper quadrant ultrasound -Hepatitis panel ordered -Will start Prednisolone 40 mg daily if hepatitis panel returns negative -HIV antibody ordered -Acetaminophen level ordered -CIWA w/ Ativan -Thiamine 123XX123 mg daily -Folic acid tablet 1 mg daily -Multivitamin 1 tablet daily   #Hematemsis: After initial evaluation, the patient reportedly vomited bright red blood after being started on heparin. Given the fact the patient has evidence of cirrhosis on the right upper quadrant ultrasound, esophageal varices bleed must be considered. GI has been consulted and will see the pt. -Appreciate GI reccomendations -Stopped heparin -Start octreotide 50 mcg load followed by 50 mcg/hr infusion -Ceftriaxone 1g daily -NPO  #HTN:  Patient states she does not like doctors and has not taken any antihypertensive medication since being diagnosed with high blood pressures 10 years ago.  The patient is currently hypotensive to the mid 0000000 systolic. -We will continue to monitor  #Hypoglycemia: Initially 31 on visit to the ED. Subsequently increased to 106 after D50 administration. -Daily BMP  #FEN/GI #Hypokalemia: 3.0 on initial BMP in the ED.  Has been repleted. -Diet: NPO -Fluids: none -Zofran 4 mg every 6 hours as needed -Daily BMP  #DVT prophylaxis -Heparin per pharmacy  #CODE STATUS: FULL  #Dispo: Admit patient to Inpatient with expected length of stay greater than 2 midnights. Prior to Admission Living Arrangement: Home Anticipated Discharge Location: Home Barriers to Discharge: Ongoing medical workup  Signed: Earlene Plater, MD Internal Medicine, PGY1 Pager: [phone]  08/02/2019,11:01 AM

## 2019-08-02 NOTE — Progress Notes (Signed)
ANTICOAGULATION CONSULT NOTE - Initial Consult  Pharmacy Consult for heparin Indication: atrial fibrillation  Not on File  Patient Measurements: Height: 5' 6.5" (168.9 cm) Weight: 89.4 kg (197 lb) IBW/kg (Calculated) : 60.45 Heparin Dosing Weight: 80kg  Vital Signs: Temp: 97.6 F (36.4 C) (04/03 0627) Temp Source: Oral (04/03 0627) BP: 85/41 (04/03 0715) Pulse Rate: 94 (04/03 0715)  Labs: Recent Labs    08/02/19 0638  HGB 14.8  HCT 43.7  PLT PLATELET CLUMPS NOTED ON SMEAR, COUNT APPEARS DECREASED    Assessment: 64yo female c/o SOB x6d, found to be in Afib, to begin heparin.  Goal of Therapy:  Heparin level 0.3-0.7 units/ml Monitor platelets by anticoagulation protocol: Yes   Plan:  Will give heparin 4000 units IV bolus x1 followed by gtt at 1200 units/hr and monitor heparin levels and CBC.  Wynona Neat, PharmD, BCPS  08/02/2019,7:40 AM

## 2019-08-02 NOTE — ED Provider Notes (Addendum)
[facility] EMERGENCY DEPARTMENT Provider Note   CSN: [id number] Arrival date & time: 08/02/19  [id number]     History Chief Complaint  Patient presents with  . Shortness of Breath  . Chest Pain    Connie [name] is a 64 y.o. female.  HPI Patient does not get medical care because she is afraid of doctors.  She was diagnosed with hypertension 10 years ago and took medications for 2 days and then discontinued them.  She is from [place].  Her daughter is in [place].  She reports she has been having problems with shortness of breath and heart racing for quite a while.  She reports she walks a short distance to work and sometimes now gets quite short of breath.  He has not had cough or fever.  No vomiting or abdominal pain.  She reports after standing at work all day she does note some swelling in her ankles.  No pain in her calves.  Patient is a regular smoker.  She reports less than 1 pack/day.  She drinks alcohol on her days off.  She estimates she drinks 2 to 3 days/week.  She reports she had a bad nosebleed many years ago when she got diagnosed with hypertension.  Other than that she has not had any problems with active bleeding.    History reviewed. No pertinent past medical history.  There are no problems to display for this patient.   History reviewed. No pertinent surgical history.   OB History   No obstetric history on file.     No family history on file.  Social History   Tobacco Use  . Smoking status: Current Every Day Smoker  Substance Use Topics  . Alcohol use: Yes  . Drug use: Not on file    Home Medications Prior to Admission medications   Not on File    Allergies    Patient has no allergy information on record.  Review of Systems   Review of Systems 10 Systems reviewed and are negative for acute change except as noted in the HPI. Physical Exam Updated Vital Signs BP 94/60   Pulse (!) 130   Temp 97.6 F (36.4 C) (Oral)   Resp 18    Ht 5' 6.5" (1.689 m)   Wt 89.4 kg   SpO2 90%   BMI 31.32 kg/m   Physical Exam Constitutional:      Comments: Alert and nontoxic.  No respiratory distress at rest.  HENT:     Head: Normocephalic and atraumatic.  Eyes:     Extraocular Movements: Extraocular movements intact.  Cardiovascular:     Comments: Tachycardia irregularly irregular. Pulmonary:     Comments: No respiratory distress at rest.  Breath sounds clear. Abdominal:     General: There is no distension.     Palpations: Abdomen is soft.     Tenderness: There is no abdominal tenderness. There is no guarding.  Musculoskeletal:        General: No swelling. Normal range of motion.     Right lower leg: No edema.     Left lower leg: No edema.  Skin:    General: Skin is warm and dry.  Neurological:     General: No focal deficit present.     Mental Status: She is oriented to person, place, and time.     Coordination: Coordination normal.  Psychiatric:        Mood and Affect: Mood normal.     Comments: Patient does  become easily anxious when discussing getting medications and medical treatments.     ED Results / Procedures / Treatments   Labs (all labs ordered are listed, but only abnormal results are displayed) Labs Reviewed  BASIC METABOLIC PANEL - Abnormal; Notable for the following components:      Result Value   Potassium 3.0 (*)    CO2 21 (*)    Glucose, Bld 44 (*)    Creatinine, Ser 1.72 (*)    GFR calc non Af Amer 31 (*)    GFR calc Af Amer 36 (*)    Anion gap 20 (*)    All other components within normal limits  CBC - Abnormal; Notable for the following components:   RDW 15.7 (*)    All other components within normal limits  PROTIME-INR - Abnormal; Notable for the following components:   Prothrombin Time 19.8 (*)    INR 1.7 (*)    All other components within normal limits  HEPATIC FUNCTION PANEL - Abnormal; Notable for the following components:   Albumin 3.3 (*)    AST 220 (*)    ALT 122 (*)     Alkaline Phosphatase 129 (*)    Bilirubin, Direct 0.6 (*)    All other components within normal limits  ETHANOL - Abnormal; Notable for the following components:   Alcohol, Ethyl (B) 235 (*)    All other components within normal limits  DIFFERENTIAL - Abnormal; Notable for the following components:   Neutro Abs 1.5 (*)    All other components within normal limits  CBC - Abnormal; Notable for the following components:   MCV 104.5 (*)    MCH 34.4 (*)    RDW 16.5 (*)    Platelets 91 (*)    All other components within normal limits  CBG MONITORING, ED - Abnormal; Notable for the following components:   Glucose-Capillary 31 (*)    All other components within normal limits  CBG MONITORING, ED - Abnormal; Notable for the following components:   Glucose-Capillary 106 (*)    All other components within normal limits  TROPONIN I (HIGH SENSITIVITY) - Abnormal; Notable for the following components:   Troponin I (High Sensitivity) 33 (*)    All other components within normal limits  RESPIRATORY PANEL BY RT PCR (FLU A&B, COVID)  MAGNESIUM  TSH  BRAIN NATRIURETIC PEPTIDE  URINALYSIS, ROUTINE W REFLEX MICROSCOPIC  RAPID URINE DRUG SCREEN, HOSP PERFORMED  HEPARIN LEVEL (UNFRACTIONATED)  TROPONIN I (HIGH SENSITIVITY)    EKG EKG Interpretation  Date/Time:  Saturday August 02 2019 06:27:27 EDT Ventricular Rate:  157 PR Interval:    QRS Duration: 86 QT Interval:  306 QTC Calculation: 494 R Axis:   18 Text Interpretation: Atrial fibrillation with rapid ventricular response with premature ventricular or aberrantly conducted complexes Marked ST abnormality, possible inferior subendocardial injury Abnormal ECG same as previous. Confirmed by Charlesetta Shanks [phone]) on 08/02/2019 7:21:11 AM   Radiology DG Chest Port 1 View  Result Date: 08/02/2019 CLINICAL DATA:  Chest pain and shortness of breath for 1 day EXAM: PORTABLE CHEST 1 VIEW COMPARISON:  None. FINDINGS: Generous heart size but within  normal limits for portable technique. Unremarkable aortic contours. There is no edema, consolidation, effusion, or pneumothorax. IMPRESSION: Negative portable chest. Electronically Signed   By: Monte Fantasia M.D.   On: 08/02/2019 07:30    Procedures Procedures (including critical care time) CRITICAL CARE Performed by: Charlesetta Shanks   Total critical care time: 45  minutes  Critical care time was exclusive of separately billable procedures and treating other patients.  Critical care was necessary to treat or prevent imminent or life-threatening deterioration.  Critical care was time spent personally by me on the following activities: development of treatment plan with patient and/or surrogate as well as nursing, discussions with consultants, evaluation of patient's response to treatment, examination of patient, obtaining history from patient or surrogate, ordering and performing treatments and interventions, ordering and review of laboratory studies, ordering and review of radiographic studies, pulse oximetry and re-evaluation of patient's condition. Medications Ordered in ED Medications  diltiazem (CARDIZEM) 1 mg/mL load via infusion 10 mg (10 mg Intravenous Bolus from Bag 08/02/19 0753)    And  diltiazem (CARDIZEM) 125 mg in dextrose 5% 125 mL (1 mg/mL) infusion (10 mg/hr Intravenous Rate/Dose Verify 08/02/19 0903)  heparin ADULT infusion 100 units/mL (25000 units/291mL sodium chloride 0.45%) (1,200 Units/hr Intravenous New Bag/Given 08/02/19 0800)  potassium chloride 10 mEq in 100 mL IVPB (10 mEq Intravenous New Bag/Given 08/02/19 0820)  sodium chloride flush (NS) 0.9 % injection 3 mL (3 mLs Intravenous Given 08/02/19 0718)  sodium chloride 0.9 % bolus 500 mL (0 mLs Intravenous Stopped 08/02/19 0731)  magnesium sulfate IVPB 2 g 50 mL (0 g Intravenous Stopped 08/02/19 0903)  sodium chloride 0.9 % bolus 500 mL (500 mLs Intravenous New Bag/Given 08/02/19 0750)  heparin bolus via infusion 4,000 Units  (4,000 Units Intravenous Bolus from Bag 08/02/19 0800)  potassium chloride SA (KLOR-CON) CR tablet 40 mEq (40 mEq Oral Given 08/02/19 0820)  dextrose 50 % solution 25 g (25 g Intravenous Given 08/02/19 0815)    ED Course  I have reviewed the triage vital signs and the nursing notes.  Pertinent labs & imaging results that were available during my care of the patient were reviewed by me and considered in my medical decision making (see chart for details).  Clinical Course as of Aug 02 1211  Sat Aug 02, 2019  0901 Patient is alert with clear mental status.  She is speaking clearly.  She reiterates that her alcohol consumption is episodic when she is not working.  She denies any history of withdrawal symptoms.  She denies tremors or confusion or vomiting when she is not drinking.  She reports she typically works 5 days a week and does not drink on those days.   [MP]  0902 Consult: Reviewed with Dr. Sallyanne Kuster of cardiology.  Will see the patient in consultation.   [MP]  [id number] Nursing staff advised they were getting ready to take the patient to her bed on the floor.  She had been lying there and doing well.  Blood pressure and heart rate have been stable.  She is suddenly advised that she was going to vomit.  Patient vomited up large volume of brown, thin soupy material.  I have gone to evaluate the patient.  She is alert and mental status is clear.  She is not in respiratory distress.  There is large volume of brown, thin emesis slightly thicker than chocolate milk all over the front of the patient.  No frank blood or clots.  No specific food material visible.  Heart rate is controlled and blood pressure is in the 160s.  Will discontinue the heparin and administer a Protonix bolus and drip.  Will collect a Gastroccult card for testing.  Patient reports yesterday evening she did drink a substantial amount of brandy, had eaten some [place].  Patient does have significant alcohol consumption with increased  risk of GI  bleed.  No known history of GI bleeding.   [MP]    Clinical Course User Index [MP] Charlesetta Shanks, MD   MDM Rules/Calculators/A&P                       CHA2DS2-VASc Score =  2 The patient's score is based upon: Female, hypertension        ASSESSMENT AND PLAN:    Signed,  Charlesetta Shanks, MD    08/02/2019 9:07 AM   Patient presents as outlined.  She is alert with clear mental status.  Alcohol is acutely elevated.  She reports she did drink a lot yesterday evening.  She describes symptoms of shortness of breath and palpitations episodically for some time.  No clear onset of atrial fibrillation.  Patient apparently is long-term untreated hypertension.  However, she has not sporadically measured blood pressures to know what they have been over the past 10 years.  First troponin mildly elevated.  EKG A. fib RVR with nonspecific ST depression that appears rate related.  No STEMI pattern.  Cardiology has been consulted.  Patient's lungs are clear to auscultation on initial examination, chest x-ray does not show vascular congestion.  Patient is given a 1 L fluid bolus for mild hypotension with A. fib RVR.  Cardizem 10 mg, magnesium 2 g, potassium replacement, heparin initiated in the emergency department.  Patient will be admitted. Final Clinical Impression(s) / ED Diagnoses Final diagnoses:  Atrial fibrillation with RVR ([place])  Alcohol consumption binge drinking  History of noncompliance with medical treatment, presenting hazards to health    Rx / DC Orders ED Discharge Orders         Ordered    Amb referral to AFIB Clinic     08/02/19 0709           Charlesetta Shanks, MD 08/02/19 [id number]    Charlesetta Shanks, MD 08/02/19 [id number]    Charlesetta Shanks, MD 08/02/19 1213

## 2019-08-02 NOTE — ED Notes (Signed)
MD Pfeiffer to bedside to reassess pt as pt vomited ?blood. Heparin drip stopped. New orders for protonix. Admitting MD paged at this time.

## 2019-08-02 NOTE — Consult Note (Addendum)
Cardiology Consultation:   Patient ID: Connie [name] MRN: [id number]; DOB: Mar 11, 1956  Admit date: 08/02/2019 Date of Consult: 08/02/2019  Primary Care Provider: Patient, No Pcp Per Primary Cardiologist: Sanda Klein, MD new Primary Electrophysiologist:  None    Patient Profile:   Connie [name] is a 64 y.o. female with a hx of long-standing untreated hypertension and alcohol use who is being seen today for the evaluation of Afib RVR and chest pain at the request of Dr. Johnney Killian.  History of Present Illness:   Connie [name] is from [facility], daughter lives in [place]. She lives with her ex-husband in [place]. During times of turmoil with her ex-husband, she relocates to [facility] or [place] to live with one of her children for several months. Prior to this visit, she was working at [employer] 5 days per week doing a physically demanding job. On her two days off, she does drink alcohol significantly - drinks brandy, does not know how many. She does not get medical care because she is afraid of doctors and hates needles.  She reports a history of hypertension, but not taking any medications - took the prescribed medicine for 1 day 10 years ago. She does not check her BP at home.   Daughter Connie [name] is at bedside and helps with history.  She presented to the ER with complaints of heart racing, SOB, and chest pain. She states that she has had racing heart beat for the past month that is associated with SOB. 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 Heart racing seems to improve with rest. Last night, she rolled over in bed and suffered a sudden onset of left-sided chest pain that prompted her visit to the ER. She also reports lower extremity swelling over the past month, with bouts of unilateral leg swelling.   She denies history of MI, stroke, or stress test. Her mother's sisters all have CAD/MI. No CAD in her parents of siblings. She smokes 1/3 ppd of cigarettes. She binge  drinks (greater than 5 drinks) on her off days (about two days per week prior to arrival in [place]).   On arrival, EKG with Afib RVR. Telemetry with HR in the 80-150s.  sCr 1.72 K 3.0 PLT 91 Hypoglycemic with glucose 44 on arrival, resolved with D50 Ethanol level 235   Past Medical History:  Diagnosis Date  . Alcohol abuse   . Hypertension     History reviewed. No pertinent surgical history.   Home Medications:  Prior to Admission medications   Not on File    Inpatient Medications: Scheduled Meds: . folic acid  1 mg Oral Daily  . multivitamin with minerals  1 tablet Oral Daily  . sodium chloride flush  3 mL Intravenous Q12H  . thiamine  100 mg Oral Daily   Continuous Infusions: . diltiazem (CARDIZEM) infusion 10 mg/hr (08/02/19 0903)  . heparin 1,200 Units/hr (08/02/19 0800)   PRN Meds:   Allergies:   Not on File  Social History:   Social History   Socioeconomic History  . Marital status: Divorced    Spouse name: Not on file  . Number of children: Not on file  . Years of education: Not on file  . Highest education level: Not on file  Occupational History  . Not on file  Tobacco Use  . Smoking status: Current Every Day Smoker  Substance and Sexual Activity  . Alcohol use: Yes  . Drug use: Not on file  .  Sexual activity: Not on file  Other Topics Concern  . Not on file  Social History Narrative  . Not on file   Social Determinants of Health   Financial Resource Strain:   . Difficulty of Paying Living Expenses:   Food Insecurity:   . Worried About Charity fundraiser in the Last Year:   . Arboriculturist in the Last Year:   Transportation Needs:   . Film/video editor (Medical):   Marland Kitchen Lack of Transportation (Non-Medical):   Physical Activity:   . Days of Exercise per Week:   . Minutes of Exercise per Session:   Stress:   . Feeling of Stress :   Social Connections:   . Frequency of Communication with Friends and Family:   . Frequency of Social  Gatherings with Friends and Family:   . Attends Religious Services:   . Active Member of Clubs or Organizations:   . Attends Archivist Meetings:   Marland Kitchen Marital Status:   Intimate Partner Violence:   . Fear of Current or Ex-Partner:   . Emotionally Abused:   Marland Kitchen Physically Abused:   . Sexually Abused:     Family History:   Family History  Problem Relation Age of Onset  . Cancer Mother      ROS:  Please see the history of present illness.   All other ROS reviewed and negative.     Physical Exam/Data:   Vitals:   08/02/19 0900 08/02/19 0915 08/02/19 0930 08/02/19 1015  BP:   112/70 (!) 107/55  Pulse: (!) 131 (!) 121 (!) 126 77  Resp: 18 15 (!) 22 13  Temp:      TempSrc:      SpO2: 92% 90% 94% 96%  Weight:      Height:        Intake/Output Summary (Last 24 hours) at 08/02/2019 1104 Last data filed at 08/02/2019 0903 Gross per 24 hour  Intake 16.05 ml  Output --  Net 16.05 ml   Last 3 Weights 08/02/2019  Weight (lbs) 197 lb  Weight (kg) 89.359 kg     Body mass index is 31.32 kg/m.  General:  Obese female in NAD HEENT: normal Neck: no JVD Vascular: No carotid bruits Cardiac:  Irregular rhythm, tachycardic rate, no murmur, exquisitely tender to palpation on her left chest Lungs:  clear to auscultation bilaterally, no wheezing, rhonchi or rales  Abd: soft, nontender, no hepatomegaly  Ext: trace edema Musculoskeletal:  No deformities, BUE and BLE strength normal and equal Skin: warm and dry  Neuro:  CNs 2-12 intact, no focal abnormalities noted Psych:  Normal affect   EKG:  The EKG was personally reviewed and demonstrates:  Afib ventricular rate 157 Telemetry:  Telemetry was personally reviewed and demonstrates:  Afib with ventricular rates 80-130s  Relevant CV Studies:  Echo pending  Laboratory Data:  High Sensitivity Troponin:   Recent Labs  Lab 08/02/19 0638 08/02/19 0749  TROPONINIHS 33* 50*     Chemistry Recent Labs  Lab 08/02/19 0638  NA  141  K 3.0*  CL 100  CO2 21*  GLUCOSE 44*  BUN 15  CREATININE 1.72*  CALCIUM 9.4  GFRNONAA 31*  GFRAA 36*  ANIONGAP 20*    Recent Labs  Lab 08/02/19 0749  PROT 7.1  ALBUMIN 3.3*  AST 220*  ALT 122*  ALKPHOS 129*  BILITOT 1.0   Hematology Recent Labs  Lab 08/02/19 0638 08/02/19 0749  WBC 6.8 5.5  RBC 4.38 4.21  HGB 14.8 14.5  HCT 43.7 44.0  MCV 99.8 104.5*  MCH 33.8 34.4*  MCHC 33.9 33.0  RDW 15.7* 16.5*  PLT PLATELET CLUMPS NOTED ON SMEAR, COUNT APPEARS DECREASED 91*   BNP Recent Labs  Lab 08/02/19 0749  BNP 58.4    DDimer No results for input(s): DDIMER in the last 168 hours.   Radiology/Studies:  DG Chest Port 1 View  Result Date: 08/02/2019 CLINICAL DATA:  Chest pain and shortness of breath for 1 day EXAM: PORTABLE CHEST 1 VIEW COMPARISON:  None. FINDINGS: Generous heart size but within normal limits for portable technique. Unremarkable aortic contours. There is no edema, consolidation, effusion, or pneumothorax. IMPRESSION: Negative portable chest. Electronically Signed   By: Monte Fantasia M.D.   On: 08/02/2019 07:30       HEAR Score (for undifferentiated chest pain):  HEAR Score: 3    Assessment and Plan:   1. Afib with RVR - new diagnosis  - patient reports heart racing and palpitations for the past month - cardizem gtt running at 10 mg/hr - rates continue in the 130-150s on exam - would titrate this to 15 mg/hr - This patients CHA2DS2-VASc Score and unadjusted Ischemic Stroke Rate (% per year) is at least 2.2 % stroke rate/year from a score of 2 (female, HTN) - heparin drip running - will add PO lopressor to help with rate control - 25 mg lopressor BID - K is 3.0 - replace per primary - Mg and TSH WNL - obtain echocardiogram - Afib may be related to alcohol use - if unable to rate control, may consider TEE/DCCV on Monday - would avoid amiodarone with transaminitis - if no procedures per primary, switch heparin to eliquis 5 mg BID   2.  Chest pain - hs troponin 33 --> 50 - EKG with Afib RVR - significant pain on palpation of her left chest, hurts when she moves in bed - given her history of obesity, smoking, and HTN - would benefit from an ischemic evaluation once rate controlled vs cardioverted - suspect CE elevation that is mild and flat is likely related to demand ischemia in the setting of RVR - ischemic evaluation based on echo results   3. Hypertension - will not add anti-hypertensives while titrating rate controlling medications - pressure marginal   4. SOB, lower extremity swelling - echo pending - does not appear extremely volume overloaded on exam - BNP WNL - she reports unilateral leg swelling, but also has a history of surgery - consider lower extremity duplex   5. AKI - baseline unknown  - sCr 1.72 - will avoid nephrotoxic agents   6. Thrombocytopenia - PLT 91 - so signs of bleeding - continue to follow with heparin drip, baseline unknown   7. Alcohol abuse 8. Elevated LFTs - binge drinks on her off days from work - ethanol elevated on arrival - 235 - will need counseling to stop in the setting of new anticoagulation - fortunately, she does not fall - echo pending - CIWA protocol - elevated LFTs - RUQ ultrasound pending, hepatitis panel - per primary      For questions or updates, please contact [facility] Please consult [email or web address] for contact info under     Signed, Ledora Bottcher, PA  08/02/2019 11:04 AM   I have seen and examined the patient along with Ledora Bottcher, PA .  I have reviewed the chart, notes and new data.  I agree with PA/NP's  note.  Key new complaints: She clearly is minimizing her alcohol intake and denies having problem with alcoholism, although her daughter reports that her mother drank a whole bottle of brandy in 24 hours.  She has been drinking "ever since I was a kid".  The patient is only intermittently aware of palpitations, which have  been present off and on for at least a month.  Denies shortness of breath or chest pain while lying in bed and appears comfortable.  Shortly after I evaluated her, received notice that she may have had hematemesis. Key examination changes: Irregular rhythm, now rate controlled in the 80s.  Minimal swelling of the ankles.  Otherwise normal cardiovascular exam without murmurs, jugular venous distention, pulse deficits.  I see no evidence of ascites or collateral venous circulation or telangiectasias. Key new findings / data: Transaminases elevated roughly 5 times upper limit of normal, INR 1.7 without anticoagulants, low albumin, platelet count 90 K.  All these suggest possibly advanced chronic liver disease with hypersplenism and parenchymal insufficiency.  PLAN: Atrial fibrillation rapid ventricular response of uncertain duration.  Hold anticoagulation for now until we clarify whether she is having hematemesis. Right upper quadrant ultrasound has been performed to evaluate for suspected liver disease.  If there is clear evidence of portal hypertension she would benefit from the use of nonselective beta-blockers for ventricular rate control and to reduce the risk of GI bleeding.  (Would prefer nadolol or propranolol rather than metoprolol).  Currently she is well controlled on intravenous diltiazem. Needs an echocardiogram to look for any structural heart disease, but it is possible that her atrial fibrillation is related to alcohol abuse. CHA2DS2-VASc score is 2 (gender, hypertension), but recommendations regarding long-term anticoagulation will depend on the findings on her echocardiogram and work-up for liver disease. Minimal elevation in high-sensitivity troponin I is not consistent with acute coronary syndrome and could well be explained by sustained tachycardia.  Sanda Klein, MD, [place] [phone] 08/02/2019, 12:23 PM

## 2019-08-02 NOTE — H&P (View-Only) (Signed)
Consultation  Referring Provider:     Dr. Jerl Santos Primary Care Physician:  Patient, No Pcp Per Primary Gastroenterologist:       None - unassigned - visiting from [facility]  Reason for Consultation:     hematemesis     Impression / Plan:   Hematemesis  Cirrhosis - EtOH, ? 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  mildl INR elevation  AKI  Hypoglycemia  ++++++  Agree w/ PPI and octreotide  You have added CTX prophylaxis as suggested  Assuming no withdrawal or other issues will plan for EGD tomorrow - sooner if clinical course suggests  Serial Hgb  Agree no anti-coag Tx  Do not use steroids - discriminant fx is not high enough and she has cirrhosis not alcoholic hepatitis  Leave NPO  IVF ordered  Agree w/ w/d protocol - no hx of this but possible  Will need other liver imaging due to lesion - CT vs MR   Gatha Mayer, MD, [facility] 08/02/2019 6:14 PM           HPI:   Connie [name] is a 64 y.o. female admitted with Afib and RVR - then had hematemesis after starting heparin infusion. + EtOH level on admit at 235. Heparin was turned off. She drinks heavily at times at least and since admit we know [place] looks like cirrhoitic liver, PLT's low and INR out a bit.  No prior hx hematemeis. Denies NSAIDS Non-adherent to medical tx for htn "I do not like doctors or needles" No prior GI w/u  Resting comfortably now. Here in town to visit daughter, grandkids and greatgtrandchild. Says she has a stressful marriage  Some left chest pain and tenderness  Glucose was 44 - Txed  No family hx liver dz  Past Medical History:  Diagnosis Date  . Alcohol abuse   . Hypertension     History reviewed. No  pertinent surgical history.  Family History  Problem Relation Age of Onset  . Lung cancer Mother      Social History   Tobacco Use  . Smoking status: Current Every Day Smoker  Substance Use Topics  . Alcohol use: Yes  . Drug use: Not on file    Prior to Admission medications   Not on File    Current Facility-Administered Medications  Medication Dose Route Frequency Provider Last Rate Last Admin  . acetaminophen (TYLENOL) tablet 325 mg  325 mg Oral Q6H PRN Masoudi, Elhamalsadat, MD       Or  . acetaminophen (TYLENOL) suppository 325 mg  325 mg Rectal Q6H PRN Masoudi, Elhamalsadat, MD      . cefTRIAXone (ROCEPHIN) 1 g in sodium chloride 0.9 % 100 mL IVPB  1 g Intravenous Q24H Earlene Plater, MD      . diltiazem (CARDIZEM) 125 mg in dextrose 5% 125 mL (1 mg/mL) infusion  5-15 mg/hr Intravenous Continuous Charlesetta Shanks, MD 10 mL/hr at 08/02/19 1311 10 mg/hr at 08/02/19 1311  . folic acid (FOLVITE) tablet 1 mg  1 mg Oral Daily Masoudi, Elhamalsadat, MD      . LORazepam (ATIVAN) tablet 1-4 mg  1-4 mg Oral Q1H PRN Dewayne Hatch, MD  Or  . LORazepam (ATIVAN) injection 1-4 mg  1-4 mg Intravenous Q1H PRN Masoudi, Elhamalsadat, MD      . multivitamin with minerals tablet 1 tablet  1 tablet Oral Daily Masoudi, Elhamalsadat, MD      . octreotide (SANDOSTATIN) 2 mcg/mL load via infusion 50 mcg  50 mcg Intravenous Once Earlene Plater, MD       And  . octreotide (SANDOSTATIN) 500 mcg in sodium chloride 0.9 % 250 mL (2 mcg/mL) infusion  50 mcg/hr Intravenous Continuous Earlene Plater, MD      . ondansetron [facility]) tablet 4 mg  4 mg Oral Q6H PRN Masoudi, Elhamalsadat, MD       Or  . ondansetron (ZOFRAN) injection 4 mg  4 mg Intravenous Q6H PRN Masoudi, Elhamalsadat, MD      . pantoprazole (PROTONIX) 80 mg in sodium chloride 0.9 % 100 mL (0.8 mg/mL) infusion  8 mg/hr Intravenous Continuous Charlesetta Shanks, MD 10 mL/hr at 08/02/19 1228 8 mg/hr at 08/02/19 1228  . [START ON  08/06/2019] pantoprazole (PROTONIX) injection 40 mg  40 mg Intravenous Q12H Pfeiffer, Jeannie Done, MD      . senna-docusate (Senokot-S) tablet 1 tablet  1 tablet Oral QHS PRN Masoudi, Elhamalsadat, MD      . sodium chloride flush (NS) 0.9 % injection 3 mL  3 mL Intravenous Q12H Masoudi, Elhamalsadat, MD      . thiamine tablet 100 mg  100 mg Oral Daily Masoudi, Elhamalsadat, MD        Allergies as of 08/02/2019  . (No Known Allergies)     Review of Systems:    This is positive for those things mentioned in the HPI, also positive for palpitations and dyspnea    Physical Exam:  Vital signs in last 24 hours: Temp:  [97.6 F (36.4 C)-99.7 F (37.6 C)] 99.7 F (37.6 C) (04/03 1702) Pulse Rate:  [71-159] 82 (04/03 1643) Resp:  [13-23] 20 (04/03 1643) BP: (85-160)/(41-113) 127/57 (04/03 1643) SpO2:  [90 %-100 %] 94 % (04/03 1643) Weight:  [89.4 kg-104 kg] 104 kg (04/03 1343) Last BM Date: 08/02/19  General:  Well-developed, well-nourished and in no acute distress Eyes:  anicteric. Lungs: Clear to auscultation bilaterally. Heart:   S1S2, no rubs, murmurs, gallops. Abdomen:  mod obesesoft, non-tender, no hepatosplenomegaly, hernia, or mass and BS+.  Extremities:  Trace bilat edema Skin   no rash. Neuro:  A&O x 3. No asterixis Psych:  appropriate mood and  Affect.   Data Reviewed:   LAB RESULTS: Recent Labs    08/02/19 0638 08/02/19 0749  WBC 6.8 5.5  HGB 14.8 14.5  HCT 43.7 44.0  PLT PLATELET CLUMPS NOTED ON SMEAR, COUNT APPEARS DECREASED 91*   BMET Recent Labs    08/02/19 0638  NA 141  K 3.0*  CL 100  CO2 21*  GLUCOSE 44*  BUN 15  CREATININE 1.72*  CALCIUM 9.4   LFT Recent Labs    08/02/19 0749  PROT 7.1  ALBUMIN 3.3*  AST 220*  ALT 122*  ALKPHOS 129*  BILITOT 1.0  BILIDIR 0.6*  IBILI 0.4   PT/INR Recent Labs    08/02/19 0749  LABPROT 19.8*  INR 1.7*   Lab Results  Component Value Date   HEPAIGM NON REACTIVE 08/02/2019   NON REACTIVE (04/03  0749) HBSAg Lab Results  Component Value Date   HCVAB Reactive (A) 08/02/2019   Lab Results  Component Value Date   TSH 0.820 08/02/2019  STUDIES: DG Chest Port 1 View  Result Date: 08/02/2019 CLINICAL DATA:  Chest pain and shortness of breath for 1 day EXAM: PORTABLE CHEST 1 VIEW COMPARISON:  None. FINDINGS: Generous heart size but within normal limits for portable technique. Unremarkable aortic contours. There is no edema, consolidation, effusion, or pneumothorax. IMPRESSION: Negative portable chest. Electronically Signed   By: Monte Fantasia M.D.   On: 08/02/2019 07:30   ECHOCARDIOGRAM COMPLETE  Result Date: 08/02/2019    ECHOCARDIOGRAM REPORT   Patient Name:   Connie [name] Date of Exam: 08/02/2019 Medical Rec #:  [id number]      Height:       67.0 in Accession #:    [id number]     Weight:       229.2 lb Date of Birth:  30-Aug-1955      BSA:          2.143 m Patient Age:    89 years       BP:           126/51 mmHg Patient Gender: F              HR:           77 bpm. Exam Location:  Inpatient Procedure: 2D Echo Indications:    Dyspnea 786.09 / R06.00  History:        Patient has no prior history of Echocardiogram examinations.                 Arrythmias:Atrial Fibrillation; Risk Factors:Current Smoker and                 Hypertension. ETOH.  Sonographer:    Leavy Cella Referring Phys: [id number] [place]  1. Left ventricular ejection fraction, by estimation, is 65 to 70%. The left ventricle has hyperdynamic function. The left ventricle has no regional wall motion abnormalities. Left ventricular diastolic parameters were normal.  2. Right ventricular systolic function is normal. The right ventricular size is normal.  3. Left atrial size was mildly dilated.  4. The mitral valve is normal in structure. No evidence of mitral valve regurgitation. No evidence of mitral stenosis.  5. The aortic valve is normal in structure. Aortic valve regurgitation is not visualized. No aortic  stenosis is present.  6. The inferior vena cava is normal in size with greater than 50% respiratory variability, suggesting right atrial pressure of 3 mmHg. FINDINGS  Left Ventricle: Left ventricular ejection fraction, by estimation, is 65 to 70%. The left ventricle has hyperdynamic function. The left ventricle has no regional wall motion abnormalities. The left ventricular internal cavity size was normal in size. There is no concentric left ventricular hypertrophy. Left ventricular diastolic parameters were normal. Normal left ventricular filling pressure. Right Ventricle: The right ventricular size is normal. No increase in right ventricular wall thickness. Right ventricular systolic function is normal. Left Atrium: Left atrial size was mildly dilated. Right Atrium: Right atrial size was normal in size. Pericardium: There is no evidence of pericardial effusion. Mitral Valve: The mitral valve is normal in structure. Normal mobility of the mitral valve leaflets. No evidence of mitral valve regurgitation. No evidence of mitral valve stenosis. Tricuspid Valve: The tricuspid valve is normal in structure. Tricuspid valve regurgitation is not demonstrated. No evidence of tricuspid stenosis. Aortic Valve: The aortic valve is normal in structure. Aortic valve regurgitation is not visualized. No aortic stenosis is present. Aortic valve mean gradient measures 11.0 mmHg. Aortic valve peak gradient measures 20.3 mmHg.  Aortic valve area, by VTI measures 2.09 cm. Pulmonic Valve: The pulmonic valve was normal in structure. Pulmonic valve regurgitation is not visualized. No evidence of pulmonic stenosis. Aorta: The aortic root is normal in size and structure. Venous: The inferior vena cava is normal in size with greater than 50% respiratory variability, suggesting right atrial pressure of 3 mmHg. IAS/Shunts: No atrial level shunt detected by color flow Doppler.  LEFT VENTRICLE PLAX 2D LVIDd:         4.70 cm  Diastology LVIDs:          2.50 cm  LV e' lateral:   12.10 cm/s LV PW:         1.20 cm  LV E/e' lateral: 7.2 LV IVS:        1.10 cm  LV e' medial:    9.25 cm/s LVOT diam:     2.18 cm  LV E/e' medial:  9.4 LV SV:         88 LV SV Index:   41 LVOT Area:     3.73 cm  RIGHT VENTRICLE RV S prime:     24.40 cm/s TAPSE (M-mode): 3.2 cm LEFT ATRIUM             Index       RIGHT ATRIUM           Index LA diam:        4.00 cm 1.87 cm/m  RA Area:     14.00 cm LA Vol (A2C):   62.9 ml 29.35 ml/m RA Volume:   34.40 ml  16.05 ml/m LA Vol (A4C):   63.9 ml 29.82 ml/m LA Biplane Vol: 69.1 ml 32.25 ml/m  AORTIC VALVE AV Area (Vmax):    2.17 cm AV Area (Vmean):   2.17 cm AV Area (VTI):     2.09 cm AV Vmax:           225.32 cm/s AV Vmean:          149.005 cm/s AV VTI:            0.420 m AV Peak Grad:      20.3 mmHg AV Mean Grad:      11.0 mmHg LVOT Vmax:         131.27 cm/s LVOT Vmean:        86.699 cm/s LVOT VTI:          0.235 m LVOT/AV VTI ratio: 0.56  AORTA Ao Root diam: 2.60 cm MITRAL VALVE MV Area (PHT): 3.03 cm    SHUNTS MV Decel Time: 250 msec    Systemic VTI:  0.24 m MV E velocity: 87.40 cm/s  Systemic Diam: 2.18 cm MV A velocity: 47.60 cm/s MV E/A ratio:  1.84 Mihai Croitoru MD Electronically signed by Sanda Klein MD Signature Date/Time: 08/02/2019/3:14:50 PM    Final    US Abdomen Limited RUQ  Result Date: 08/02/2019 CLINICAL DATA:  Elevated LFTs EXAM: ULTRASOUND ABDOMEN LIMITED RIGHT UPPER QUADRANT COMPARISON:  None. FINDINGS: Gallbladder: No gallstones or wall thickening visualized. No sonographic Murphy sign noted by sonographer. Common bile duct: Diameter: Not visualized due to body habitus, difficulty positioning the patient common overlying bowel gas Liver: Heterogeneous increased liver echogenicity. Central liver hypoechoic indeterminate lesion noted measuring 1.7 x 1.9 x 2.5 cm adjacent/anterior to the porta hepatis. This lesion may be cystic but difficult to confirm with this suboptimal ultrasound. Liver surface does appear  nodular suggesting cirrhosis. No intrahepatic biliary dilatation. Portal vein is patent on color  Doppler imaging with normal direction of blood flow towards the liver. Other: No ascites or free fluid. Included views of the right kidney demonstrate no hydronephrosis. IMPRESSION: No gallbladder abnormality, cholelithiasis or biliary obstruction Suboptimal exam as above.  Underlying hepatic cirrhosis suspected. 2.5 cm indeterminate central hypoechoic hepatic lesion. See above comment. Electronically Signed   By: Jerilynn Mages.  Shick M.D.   On: 08/02/2019 11:31      Thanks   LOS: 0 days   @  Simonne Maffucci, MD, [facility] @  08/02/2019, 6:11 PM

## 2019-08-03 ENCOUNTER — Observation Stay (HOSPITAL_COMMUNITY): Payer: Self-pay | Admitting: Anesthesiology

## 2019-08-03 ENCOUNTER — Other Ambulatory Visit: Payer: Self-pay

## 2019-08-03 ENCOUNTER — Encounter (HOSPITAL_COMMUNITY)
Admission: EM | Disposition: A | Discharge status: Home / Self Care | Payer: Self-pay | Source: Home / Self Care | Attending: Internal Medicine

## 2019-08-03 ENCOUNTER — Encounter (HOSPITAL_COMMUNITY): Payer: Self-pay | Admitting: Internal Medicine

## 2019-08-03 DIAGNOSIS — Z79899 Other long term (current) drug therapy: Secondary | ICD-10-CM

## 2019-08-03 DIAGNOSIS — Z9114 Patient's other noncompliance with medication regimen: Secondary | ICD-10-CM

## 2019-08-03 DIAGNOSIS — K209 Esophagitis, unspecified without bleeding: Secondary | ICD-10-CM

## 2019-08-03 DIAGNOSIS — Z720 Tobacco use: Secondary | ICD-10-CM

## 2019-08-03 DIAGNOSIS — I10 Essential (primary) hypertension: Secondary | ICD-10-CM

## 2019-08-03 DIAGNOSIS — K2210 Ulcer of esophagus without bleeding: Secondary | ICD-10-CM

## 2019-08-03 DIAGNOSIS — R7401 Elevation of levels of liver transaminase levels: Secondary | ICD-10-CM

## 2019-08-03 DIAGNOSIS — K2970 Gastritis, unspecified, without bleeding: Secondary | ICD-10-CM

## 2019-08-03 DIAGNOSIS — E669 Obesity, unspecified: Secondary | ICD-10-CM

## 2019-08-03 DIAGNOSIS — K2960 Other gastritis without bleeding: Secondary | ICD-10-CM

## 2019-08-03 DIAGNOSIS — K7460 Unspecified cirrhosis of liver: Secondary | ICD-10-CM

## 2019-08-03 DIAGNOSIS — R739 Hyperglycemia, unspecified: Secondary | ICD-10-CM

## 2019-08-03 DIAGNOSIS — K7030 Alcoholic cirrhosis of liver without ascites: Secondary | ICD-10-CM

## 2019-08-03 DIAGNOSIS — F1010 Alcohol abuse, uncomplicated: Secondary | ICD-10-CM

## 2019-08-03 DIAGNOSIS — K2980 Duodenitis without bleeding: Secondary | ICD-10-CM

## 2019-08-03 HISTORY — PX: BIOPSY: SHX5522

## 2019-08-03 HISTORY — PX: ESOPHAGOGASTRODUODENOSCOPY (EGD) WITH PROPOFOL: SHX5813

## 2019-08-03 LAB — COMPREHENSIVE METABOLIC PANEL
ALT: 95 U/L — ABNORMAL HIGH (ref 0–44)
AST: 181 U/L — ABNORMAL HIGH (ref 15–41)
Albumin: 2.8 g/dL — ABNORMAL LOW (ref 3.5–5.0)
Alkaline Phosphatase: 93 U/L (ref 38–126)
Anion gap: 11 (ref 5–15)
BUN: 16 mg/dL (ref 8–23)
CO2: 24 mmol/L (ref 22–32)
Calcium: 8.2 mg/dL — ABNORMAL LOW (ref 8.9–10.3)
Chloride: 104 mmol/L (ref 98–111)
Creatinine, Ser: 1.56 mg/dL — ABNORMAL HIGH (ref 0.44–1.00)
GFR calc Af Amer: 41 mL/min — ABNORMAL LOW (ref >60)
GFR calc non Af Amer: 35 mL/min — ABNORMAL LOW (ref >60)
Glucose, Bld: 276 mg/dL — ABNORMAL HIGH (ref 70–99)
Potassium: 5.1 mmol/L (ref 3.5–5.1)
Sodium: 139 mmol/L (ref 135–145)
Total Bilirubin: 2.1 mg/dL — ABNORMAL HIGH (ref 0.3–1.2)
Total Protein: 6.1 g/dL — ABNORMAL LOW (ref 6.5–8.1)

## 2019-08-03 LAB — CBC
HCT: 38 % (ref 36.0–46.0)
Hemoglobin: 12.9 g/dL (ref 12.0–15.0)
MCH: 33.7 pg (ref 26.0–34.0)
MCHC: 33.9 g/dL (ref 30.0–36.0)
MCV: 99.2 fL (ref 80.0–100.0)
Platelets: 65 10*3/uL — ABNORMAL LOW (ref 150–400)
RBC: 3.83 MIL/uL — ABNORMAL LOW (ref 3.87–5.11)
RDW: 15.9 % — ABNORMAL HIGH (ref 11.5–15.5)
WBC: 4.6 10*3/uL (ref 4.0–10.5)
nRBC: 0 % (ref 0.0–0.2)

## 2019-08-03 LAB — HCV RNA QUANT RFLX ULTRA OR GENOTYP
HCV RNA Qnt(log copy/mL): UNDETERMINED log10 IU/mL
HepC Qn: NOT DETECTED IU/mL

## 2019-08-03 LAB — GLUCOSE, CAPILLARY
Glucose-Capillary: 267 mg/dL — ABNORMAL HIGH (ref 70–99)
Glucose-Capillary: 162 mg/dL — ABNORMAL HIGH (ref 70–99)
Glucose-Capillary: 131 mg/dL — ABNORMAL HIGH (ref 70–99)

## 2019-08-03 SURGERY — ESOPHAGOGASTRODUODENOSCOPY (EGD) WITH PROPOFOL
Anesthesia: Monitor Anesthesia Care

## 2019-08-03 MED ORDER — PROPOFOL 10 MG/ML IV BOLUS
INTRAVENOUS | Status: DC | PRN
  Administered 2019-08-03: 20 mg via INTRAVENOUS
  Administered 2019-08-03: 10 mg via INTRAVENOUS
  Administered 2019-08-03: 20 mg via INTRAVENOUS
  Administered 2019-08-03: 10 mg via INTRAVENOUS
  Administered 2019-08-03 (×2): 20 mg via INTRAVENOUS

## 2019-08-03 MED ORDER — INSULIN ASPART 100 UNIT/ML ~~LOC~~ SOLN
0.0000 [IU] | Freq: Three times a day (TID) | SUBCUTANEOUS | Status: DC
  Administered 2019-08-03: 1 [IU] via SUBCUTANEOUS

## 2019-08-03 MED ORDER — PROPOFOL 500 MG/50ML IV EMUL
INTRAVENOUS | Status: DC | PRN
  Administered 2019-08-03: 100 ug/kg/min via INTRAVENOUS

## 2019-08-03 MED ORDER — SODIUM CHLORIDE 0.9 % IV SOLN: INTRAVENOUS | Status: DC

## 2019-08-03 MED ORDER — LIVING WELL WITH DIABETES BOOK
Freq: Once | Status: DC
  Filled 2019-08-03: qty 1

## 2019-08-03 MED ORDER — LACTATED RINGERS IV SOLN: INTRAVENOUS | Status: DC

## 2019-08-03 MED ORDER — METOPROLOL SUCCINATE ER 25 MG PO TB24
25.0000 mg | ORAL_TABLET | Freq: Every day | ORAL | Status: DC
  Administered 2019-08-03 – 2019-08-04 (×2): 25 mg via ORAL
  Filled 2019-08-03 (×2): qty 1

## 2019-08-03 MED ORDER — PANTOPRAZOLE SODIUM 40 MG IV SOLR
40.0000 mg | Freq: Two times a day (BID) | INTRAVENOUS | Status: DC
  Administered 2019-08-03 (×2): 40 mg via INTRAVENOUS
  Filled 2019-08-03 (×2): qty 40

## 2019-08-03 SURGICAL SUPPLY — 14 items

## 2019-08-03 NOTE — Interval H&P Note (Signed)
History and Physical Interval Note:  08/03/2019 9:24 AM  Connie [name]  has presented today for surgery, with the diagnosis of hematemesis, cirrhosis.  The various methods of treatment have been discussed with the patient and family. After consideration of risks, benefits and other options for treatment, the patient has consented to  Procedure(s): ESOPHAGOGASTRODUODENOSCOPY (EGD) WITH PROPOFOL (N/A) as a surgical intervention.  The patient's history has been reviewed, patient examined, no change in status, stable for surgery.  I have reviewed the patient's chart and labs.  Questions were answered to the patient's satisfaction.     Silvano Rusk

## 2019-08-03 NOTE — Transfer of Care (Signed)
Immediate Anesthesia Transfer of Care Note  Patient: Connie [name]  Procedure(s) Performed: ESOPHAGOGASTRODUODENOSCOPY (EGD) WITH PROPOFOL (N/A ) BIOPSY  Patient Location: Endoscopy Unit  Anesthesia Type:MAC  Level of Consciousness: awake, alert  and oriented  Airway & Oxygen Therapy: Patient Spontanous Breathing and Patient connected to nasal cannula oxygen  Post-op Assessment: Report given to RN and Post -op Vital signs reviewed and stable  Post vital signs: Reviewed and stable  Last Vitals:  Vitals Value Taken Time  BP 118/57 08/03/19 1002  Temp 36.7 C 08/03/19 1002  Pulse 63 08/03/19 1003  Resp 19 08/03/19 1003  SpO2 96 % 08/03/19 1003  Vitals shown include unvalidated device data.  Last Pain:  Vitals:   08/03/19 1002  TempSrc: Axillary  PainSc: Asleep         Complications: No apparent anesthesia complications

## 2019-08-03 NOTE — Anesthesia Postprocedure Evaluation (Signed)
Anesthesia Post Note  Patient: Connie [name]  Procedure(s) Performed: ESOPHAGOGASTRODUODENOSCOPY (EGD) WITH PROPOFOL (N/A ) BIOPSY     Patient location during evaluation: Endoscopy Anesthesia Type: MAC Level of consciousness: awake and alert Pain management: pain level controlled Vital Signs Assessment: post-procedure vital signs reviewed and stable Respiratory status: spontaneous breathing, nonlabored ventilation and respiratory function stable Cardiovascular status: stable and blood pressure returned to baseline Postop Assessment: no apparent nausea or vomiting Anesthetic complications: no    Last Vitals:  Vitals:   08/03/19 1041 08/03/19 1133  BP: 129/71 119/81  Pulse:  60  Resp:    Temp: 36.6 C 36.8 C  SpO2:      Last Pain:  Vitals:   08/03/19 1133  TempSrc: Oral  PainSc:                  Catalina Gravel

## 2019-08-03 NOTE — Anesthesia Preprocedure Evaluation (Addendum)
Anesthesia Evaluation  Patient identified by MRN, date of birth, ID band Patient awake    Reviewed: Allergy & Precautions, NPO status , Patient's Chart, lab work & pertinent test results  Airway Mallampati: II  TM Distance: >3 FB Neck ROM: Full    Dental  (+) Dental Advisory Given, Edentulous Lower, Edentulous Upper   Pulmonary Current Smoker and Patient abstained from smoking.,    Pulmonary exam normal breath sounds clear to auscultation       Cardiovascular hypertension (untreated), + dysrhythmias (Diltiazem gtt) Atrial Fibrillation  Rhythm:Regular Rate:Normal     Neuro/Psych negative neurological ROS  negative psych ROS   GI/Hepatic (+) Cirrhosis       , Hematemesis   Endo/Other  Obesity   Renal/GU Renal disease (AKI)     Musculoskeletal negative musculoskeletal ROS (+)   Abdominal   Peds  Hematology  (+) Blood dyscrasia (Thrombocytopenia), ,   Anesthesia Other Findings Day of surgery medications reviewed with the patient.  Reproductive/Obstetrics                            Anesthesia Physical Anesthesia Plan  ASA: III  Anesthesia Plan: MAC   Post-op Pain Management:    Induction: Intravenous  PONV Risk Score and Plan: 1 and Propofol infusion and Treatment may vary due to age or medical condition  Airway Management Planned: Nasal Cannula and Natural Airway  Additional Equipment:   Intra-op Plan:   Post-operative Plan:   Informed Consent: I have reviewed the patients History and Physical, chart, labs and discussed the procedure including the risks, benefits and alternatives for the proposed anesthesia with the patient or authorized representative who has indicated his/her understanding and acceptance.     Dental advisory given  Plan Discussed with: CRNA and Anesthesiologist  Anesthesia Plan Comments:         Anesthesia Quick Evaluation

## 2019-08-03 NOTE — Progress Notes (Signed)
Subjective:   Pt was seen at the bedside today. She reports some burning in her abdomen. Did not have any further nausea or vomiting. She endorses that she has not taken any food (npo) We discussed goal of care and plan for doing endoscopy today. She agrees with the plan.   Objective: CBC Latest Ref Rng & Units 08/03/2019 08/02/2019 08/02/2019  WBC 4.0 - 10.5 K/uL 4.6 6.3 5.5  Hemoglobin 12.0 - 15.0 g/dL 12.9 12.8 14.5  Hematocrit 36.0 - 46.0 % 38.0 36.9 44.0  Platelets 150 - 400 K/uL 65(L) 72(L) 91(L)   BMP Latest Ref Rng & Units 08/03/2019 08/02/2019  Glucose 70 - 99 mg/dL 276(H) 44(LL)  BUN 8 - 23 mg/dL 16 15  Creatinine 0.44 - 1.00 mg/dL 1.56(H) 1.72(H)  Sodium 135 - 145 mmol/L 139 141  Potassium 3.5 - 5.1 mmol/L 5.1 3.0(L)  Chloride 98 - 111 mmol/L 104 100  CO2 22 - 32 mmol/L 24 21(L)  Calcium 8.9 - 10.3 mg/dL 8.2(L) 9.4   Vital signs in last 24 hours: Vitals:   08/02/19 1702 08/02/19 2013 08/03/19 0011 08/03/19 0529  BP:  (!) 141/50 (!) 130/54 133/66  Pulse:  68 86 73  Resp:  16  17  Temp: 99.7 F (37.6 C)  98.9 F (37.2 C) 98.4 F (36.9 C)  TempSrc:   Oral Oral  SpO2:  97%  95%  Weight:    106 kg  Height:        Physical Exam General: Comfortably resting in bed HEENT: NCAT CV: RRR, normal S1-S2 no murmurs rubs or gallops appreciated PULM: Clear in all lung fields bilaterally ABD: Soft and nontender in all quadrants NEURO: Alert and oriented x4, no focal deficits, no asterixis  Assessment/Plan:  Active Problems:   Atrial fibrillation with RVR (HCC)   Hematemesis with nausea   Alcoholic cirrhosis of liver without ascites (HCC)   Hepatitis C antibody positive in blood  In summary, Connie [name] is a 64 year old female with history of untreated hypertension, tobacco use and suspected EtOH use disorder who presented with SOB, chest pain and palpitations and hematemesis. She was noted to be in A. fib with RVR. On initial labs, it was noted that she had an elevated EtOH,  transaminitis, and elevated PT/INR, raising concerns for hepatic injury secondary to alcohol use. Hematemesis concerning for bleeding esophageal varices.  #A-fib w/ RVR #Chest Pain #SOB #Troponemia: Patient presented with A. fib with RVR with heart rate in the 130s. Spontaneously converted back to sinus rhythm after diltiazem drip. Cardiology is on board. Troponinemia to 33 and 50 on initial lab, likely demand ischemia in the setting of A. Fib. CHADSVasc = 2 and should be anticoagulated long term. Echocardiogram unremarkable, showed no evidence of thrombus. -Cardiology is consulted, will appreciate recommendations              -D/c Diltiazem drip  -Start Metoprolol 25 mg daily             -Start DOAC once GI feels bleeding risk is minimized -Avoid amiodarone due to transaminitis -Telemetry              #Hematemsis: Pt had single episode of hematemesis in the ED after being started on heparin while she was in A-fib w/ RVR.  Heparin was stopped, and GI performed an EGD on 4/4, which showed black pigmented erosive esophagitis, gastritis, and duodenitis. Biopsies were taken of the erosive lesions. No signs of portal hypertension or esophageal varices.   -  Appreciate GI reccomendations  -D/c octreotide and ceftriaxone  -Omeprazole 40 mg twice daily  -Advance diet as tolerated  -Hold off on anticoagulation for a-fib until pt has been on PPI for 1-2 weeks -Stopped IVF  #EtOH use disorder #Transaminitis  #Elevated PT/INR #Liver Lesion: EtOH elevated to 235 on admission.  Patient states that she drank alcohol the night before but did not quantify how many drinks she had.  States probably more than 5. PT and INR to 19.8 and 1.7 respectively. AST and ALT downtrending to 181/95 from 220/122.  I suspect this is secondary to excessive alcohol use. Hep C antibody positive and RNA is pending. Right upper quadrant ultrasound did show 2.5cm indeterminate central hypoechoic lesion in liver. Discussed this with  radiology and they recommend f/u MRI hepatic protocol, previably in the outpatient setting. -F/u Hep C RNA -CIWA w/ Ativan -Thiamine 123XX123 mg daily -Folic acid tablet 1 mg daily -Multivitamin 1 tablet daily  #Hyperglycemia: Patient had elevated glucose to 276 and 267 4 hours apart this morning. A1c was 5.6 on admission.  Patient has no history of diabetes. -Continue to monitor, may need SSI -F/u with PCP  #HTN: Patient states she does not like doctors and has not taken any antihypertensive medication since being diagnosed with high blood pressures 10 years ago.  The patient is currently normotensive. -Continue to monitor  #FEN/GI -Diet: Carb modified -Fluids: none -Zofran 4 mg every 6 hours as needed -Daily BMP  #DVT prophylaxis -Heparin per pharmacy  #CODE STATUS: FULL  #Dispo: Admit patient to Inpatient with expected length of stay greater than 2 midnights. Prior to Admission Living Arrangement: Home Anticipated Discharge Location: Home [facility] to [facility] medical workup  Earlene Plater, MD Internal Medicine, PGY1 Pager: [phone]  08/03/2019,12:08 PM

## 2019-08-03 NOTE — Progress Notes (Signed)
Progress Note  Patient Name: Connie [name] Date of Encounter: 08/03/2019  Primary Cardiologist: Sanda Klein, MD   Subjective   Seen immediately after EGD. Still very drowsy. Has maintained NSR since yesterday PM. EGD did not show esophageal varices (+ve esophagitis and gastritis) and no ongoing bleeding. US shows normal centripetal portal vein flow, but changes suggestive of cirrhosis.  Inpatient Medications    Scheduled Meds: . [MAR Hold] folic acid  1 mg Oral Daily  . metoprolol succinate  25 mg Oral Daily  . [MAR Hold] multivitamin with minerals  1 tablet Oral Daily  . [MAR Hold] pantoprazole  40 mg Intravenous Q12H  . [MAR Hold] sodium chloride flush  3 mL Intravenous Q12H  . [MAR Hold] thiamine  100 mg Oral Daily   Continuous Infusions: . sodium chloride 100 mL/hr at 08/03/19 0904  . [MAR Hold] cefTRIAXone (ROCEPHIN)  IV Stopped (08/03/19 0416)  . octreotide  (SANDOSTATIN)    IV infusion 50 mcg/hr (08/03/19 0907)  . pantoprozole (PROTONIX) infusion 8 mg/hr (08/03/19 0544)   PRN Meds: [MAR Hold] LORazepam **OR** [MAR Hold] LORazepam, [MAR Hold] ondansetron **OR** [MAR Hold] ondansetron (ZOFRAN) IV, [MAR Hold] senna-docusate   Vital Signs    Vitals:   08/03/19 0841 08/03/19 0919 08/03/19 1002 08/03/19 1013  BP: 135/65 (!) 145/63 (!) 118/57 (!) 114/51  Pulse: 67 65 66 (!) 58  Resp: 18 15 18 15   Temp:  98 F (36.7 C) 98.1 F (36.7 C)   TempSrc:  Oral Axillary   SpO2: 92% 96% 96% 98%  Weight:  106 kg    Height:  5\' 7"  (1.702 m)      Intake/Output Summary (Last 24 hours) at 08/03/2019 1014 Last data filed at 08/03/2019 0400 Gross per 24 hour  Intake 1860.45 ml  Output -  Net 1860.45 ml   Last 3 Weights 08/03/2019 08/03/2019 08/02/2019  Weight (lbs) 233 lb 11 oz 233 lb 11 oz 229 lb 3.2 oz  Weight (kg) 106 kg 106 kg 103.964 kg      Telemetry    NSR - Personally Reviewed  ECG    NSR, left atrial enlargement, prolonged QT 484 ms - Personally Reviewed   Physical Exam  Sleepy, but coherent and oriented when awoken GEN: No acute distress.   Neck: No JVD Cardiac: RRR, no murmurs, rubs, or gallops.  Respiratory: Clear to auscultation bilaterally. GI: Soft, nontender, non-distended  MS: No edema; No deformity. Neuro:  Nonfocal  Psych: Normal affect   Labs    High Sensitivity Troponin:   Recent Labs  Lab 08/02/19 0638 08/02/19 0749  TROPONINIHS 33* 50*      Chemistry Recent Labs  Lab 08/02/19 0638 08/02/19 0749 08/03/19 0403  NA 141  --  139  K 3.0*  --  5.1  CL 100  --  104  CO2 21*  --  24  GLUCOSE 44*  --  276*  BUN 15  --  16  CREATININE 1.72*  --  1.56*  CALCIUM 9.4  --  8.2*  PROT  --  7.1 6.1*  ALBUMIN  --  3.3* 2.8*  AST  --  220* 181*  ALT  --  122* 95*  ALKPHOS  --  129* 93  BILITOT  --  1.0 2.1*  GFRNONAA 31*  --  35*  GFRAA 36*  --  41*  ANIONGAP 20*  --  11     Hematology Recent Labs  Lab 08/02/19 0749 08/02/19 1928 08/03/19 0403  WBC 5.5 6.3 4.6  RBC 4.21 3.82* 3.83*  HGB 14.5 12.8 12.9  HCT 44.0 36.9 38.0  MCV 104.5* 96.6 99.2  MCH 34.4* 33.5 33.7  MCHC 33.0 34.7 33.9  RDW 16.5* 15.6* 15.9*  PLT 91* 72* 65*    BNP Recent Labs  Lab 08/02/19 0749  BNP 58.4     DDimer No results for input(s): DDIMER in the last 168 hours.   Radiology    DG Chest Port 1 View  Result Date: 08/02/2019 CLINICAL DATA:  Chest pain and shortness of breath for 1 day EXAM: PORTABLE CHEST 1 VIEW COMPARISON:  None. FINDINGS: Generous heart size but within normal limits for portable technique. Unremarkable aortic contours. There is no edema, consolidation, effusion, or pneumothorax. IMPRESSION: Negative portable chest. Electronically Signed   By: Monte Fantasia M.D.   On: 08/02/2019 07:30   ECHOCARDIOGRAM COMPLETE  Result Date: 08/02/2019    ECHOCARDIOGRAM REPORT   Patient Name:   [name] Date of Exam: 08/02/2019 Medical Rec #:  [id number]      Height:       67.0 in Accession #:    [id number]     Weight:        229.2 lb Date of Birth:  1955/06/23      BSA:          2.143 m Patient Age:    64 years       BP:           126/51 mmHg Patient Gender: F              HR:           77 bpm. Exam Location:  Inpatient Procedure: 2D Echo Indications:    Dyspnea 786.09 / R06.00  History:        Patient has no prior history of Echocardiogram examinations.                 Arrythmias:Atrial Fibrillation; Risk Factors:Current Smoker and                 Hypertension. ETOH.  Sonographer:    Leavy Cella Referring Phys: [id number] [place]  1. Left ventricular ejection fraction, by estimation, is 65 to 70%. The left ventricle has hyperdynamic function. The left ventricle has no regional wall motion abnormalities. Left ventricular diastolic parameters were normal.  2. Right ventricular systolic function is normal. The right ventricular size is normal.  3. Left atrial size was mildly dilated.  4. The mitral valve is normal in structure. No evidence of mitral valve regurgitation. No evidence of mitral stenosis.  5. The aortic valve is normal in structure. Aortic valve regurgitation is not visualized. No aortic stenosis is present.  6. The inferior vena cava is normal in size with greater than 50% respiratory variability, suggesting right atrial pressure of 3 mmHg. FINDINGS  Left Ventricle: Left ventricular ejection fraction, by estimation, is 65 to 70%. The left ventricle has hyperdynamic function. The left ventricle has no regional wall motion abnormalities. The left ventricular internal cavity size was normal in size. There is no concentric left ventricular hypertrophy. Left ventricular diastolic parameters were normal. Normal left ventricular filling pressure. Right Ventricle: The right ventricular size is normal. No increase in right ventricular wall thickness. Right ventricular systolic function is normal. Left Atrium: Left atrial size was mildly dilated. Right Atrium: Right atrial size was normal in size.  Pericardium: There is no evidence of pericardial effusion. Mitral Valve: The mitral  valve is normal in structure. Normal mobility of the mitral valve leaflets. No evidence of mitral valve regurgitation. No evidence of mitral valve stenosis. Tricuspid Valve: The tricuspid valve is normal in structure. Tricuspid valve regurgitation is not demonstrated. No evidence of tricuspid stenosis. Aortic Valve: The aortic valve is normal in structure. Aortic valve regurgitation is not visualized. No aortic stenosis is present. Aortic valve mean gradient measures 11.0 mmHg. Aortic valve peak gradient measures 20.3 mmHg. Aortic valve area, by VTI measures 2.09 cm. Pulmonic Valve: The pulmonic valve was normal in structure. Pulmonic valve regurgitation is not visualized. No evidence of pulmonic stenosis. Aorta: The aortic root is normal in size and structure. Venous: The inferior vena cava is normal in size with greater than 50% respiratory variability, suggesting right atrial pressure of 3 mmHg. IAS/Shunts: No atrial level shunt detected by color flow Doppler.  LEFT VENTRICLE PLAX 2D LVIDd:         4.70 cm  Diastology LVIDs:         2.50 cm  LV e' lateral:   12.10 cm/s LV PW:         1.20 cm  LV E/e' lateral: 7.2 LV IVS:        1.10 cm  LV e' medial:    9.25 cm/s LVOT diam:     2.18 cm  LV E/e' medial:  9.4 LV SV:         88 LV SV Index:   41 LVOT Area:     3.73 cm  RIGHT VENTRICLE RV S prime:     24.40 cm/s TAPSE (M-mode): 3.2 cm LEFT ATRIUM             Index       RIGHT ATRIUM           Index LA diam:        4.00 cm 1.87 cm/m  RA Area:     14.00 cm LA Vol (A2C):   62.9 ml 29.35 ml/m RA Volume:   34.40 ml  16.05 ml/m LA Vol (A4C):   63.9 ml 29.82 ml/m LA Biplane Vol: 69.1 ml 32.25 ml/m  AORTIC VALVE AV Area (Vmax):    2.17 cm AV Area (Vmean):   2.17 cm AV Area (VTI):     2.09 cm AV Vmax:           225.32 cm/s AV Vmean:          149.005 cm/s AV VTI:            0.420 m AV Peak Grad:      20.3 mmHg AV Mean Grad:      11.0  mmHg LVOT Vmax:         131.27 cm/s LVOT Vmean:        86.699 cm/s LVOT VTI:          0.235 m LVOT/AV VTI ratio: 0.56  AORTA Ao Root diam: 2.60 cm MITRAL VALVE MV Area (PHT): 3.03 cm    SHUNTS MV Decel Time: 250 msec    Systemic VTI:  0.24 m MV E velocity: 87.40 cm/s  Systemic Diam: 2.18 cm MV A velocity: 47.60 cm/s MV E/A ratio:  1.84 Kire Ferg MD Electronically signed by Sanda Klein MD Signature Date/Time: 08/02/2019/3:14:50 PM    Final    US Abdomen Limited RUQ  Result Date: 08/02/2019 CLINICAL DATA:  Elevated LFTs EXAM: ULTRASOUND ABDOMEN LIMITED RIGHT UPPER QUADRANT COMPARISON:  None. FINDINGS: Gallbladder: No gallstones or wall thickening visualized. No  sonographic Percell Miller sign noted by sonographer. Common bile duct: Diameter: Not visualized due to body habitus, difficulty positioning the patient common overlying bowel gas Liver: Heterogeneous increased liver echogenicity. Central liver hypoechoic indeterminate lesion noted measuring 1.7 x 1.9 x 2.5 cm adjacent/anterior to the porta hepatis. This lesion may be cystic but difficult to confirm with this suboptimal ultrasound. Liver surface does appear nodular suggesting cirrhosis. No intrahepatic biliary dilatation. Portal vein is patent on color Doppler imaging with normal direction of blood flow towards the liver. Other: No ascites or free fluid. Included views of the right kidney demonstrate no hydronephrosis. IMPRESSION: No gallbladder abnormality, cholelithiasis or biliary obstruction Suboptimal exam as above.  Underlying hepatic cirrhosis suspected. 2.5 cm indeterminate central hypoechoic hepatic lesion. See above comment. Electronically Signed   By: Jerilynn Mages.  Shick M.D.   On: 08/02/2019 11:31    Cardiac Studies   ECHO 08/02/2019 1. Left ventricular ejection fraction, by estimation, is 65 to 70%. The  left ventricle has hyperdynamic function. The left ventricle has no  regional wall motion abnormalities. Left ventricular diastolic parameters   were normal.  2. Right ventricular systolic function is normal. The right ventricular  size is normal.  3. Left atrial size was mildly dilated.  4. The mitral valve is normal in structure. No evidence of mitral valve  regurgitation. No evidence of mitral stenosis.  5. The aortic valve is normal in structure. Aortic valve regurgitation is  not visualized. No aortic stenosis is present.  6. The inferior vena cava is normal in size with greater than 50%  respiratory variability, suggesting right atrial pressure of 3 mmHg.   Patient Profile     64 y.o. female with heavy alcohol abuse, HTN, presenting with newly diagnosed atrial fibrillation with RVR, spontaeously converted to NSR, mild left atrial dilation without other significant cardiac abnormalities on echo.  Assessment & Plan    1. AFib: paroxysmal, spontaneously resolved. CHADSVasc score 2, long term anticoagulation is indicated, but short term embolic risk is low, so can start DOAC when GI feels risk of GI bleeding is lower. Will start metoprolol PO, stop IV diltiazem. 2. HTN: reported diagnosis.  3.  Cirrhosis:  By imaging studies and labs, likely alcohol related. Thankfully without portal HTN or varices.     For questions or updates, please contact [place] Please consult [email or web address] for contact info under        Signed, Sanda Klein, MD  08/03/2019, 10:14 AM

## 2019-08-03 NOTE — Op Note (Signed)
[facility] Patient Name: Connie [name] Procedure Date : 08/03/2019 MRN: [id number] Attending MD: Gatha Mayer , MD Date of Birth: 04-Jan-1956 CSN: [id number] Age: 64 Admit Type: Inpatient Procedure:                Upper GI endoscopy Indications:              Hematemesis Providers:                Gatha Mayer, MD, Jeanella Cara, RN,                            Elspeth Cho Tech., Technician, Clearnce Sorrel, CRNA Referring MD:              Medicines:                Propofol per Anesthesia, Monitored Anesthesia Care Complications:            No immediate complications. Estimated Blood Loss:     Estimated blood loss was minimal. Procedure:                Pre-Anesthesia Assessment:                           - Prior to the procedure, a History and Physical                            was performed, and patient medications and                            allergies were reviewed. The patient's tolerance of                            previous anesthesia was also reviewed. The risks                            and benefits of the procedure and the sedation                            options and risks were discussed with the patient.                            All questions were answered, and informed consent                            was obtained. Prior Anticoagulants: The patient has                            taken no previous anticoagulant or antiplatelet                            agents. ASA Grade Assessment: III - A patient with                            severe systemic disease. After reviewing the risks  and benefits, the patient was deemed in                            satisfactory condition to undergo the procedure.                           After obtaining informed consent, the endoscope was                            passed under direct vision. Throughout the                            procedure, the patient's blood pressure, pulse,  and                            oxygen saturations were monitored continuously. The                            [id number]) Olympus gastroscope was                            introduced through the mouth, and advanced to the                            second part of duodenum. The upper GI endoscopy was                            accomplished without difficulty. The patient                            tolerated the procedure fairly well. Scope In: Scope Out: Findings:      Black, pigmented erosive esophagitis with no bleeding was found in the       distal esophagus. Biopsies were taken with a cold forceps for histology.       Verification of patient identification for the specimen was done.       Estimated blood loss was minimal.      Patchy inflammation characterized by erosions and friability was found       in the gastric body and in the gastric antrum. Biopsies were taken with       a cold forceps for histology. Verification of patient identification for       the specimen was done. Estimated blood loss was minimal.      Mild inflammation was found in the duodenal bulb.      The exam was otherwise without abnormality.      The cardia and gastric fundus were normal on retroflexion. Impression:               - Black, pigmented erosive esophagitis with no                            bleeding. Biopsied. I do think this caused                            hematemesis after heparin instituted.                           -  Gastritis. Biopsied.                           - Acute duodenitis.                           - The examination was otherwise normal. Recommendation:           - Return patient to hospital ward for ongoing care.                           - Advance diet as tolerated.                           - I stopped octreotide, CTX and PPI infusion                           Go to bid PPI IV and when taking po I think qd po                            PPI                            Await path - unusual black pigmentation of the                            esophagitis that I think was cause of hematemesis ?                            medication causing pigmentation but no hx iron ?                            something else we are unaware of                           No signs of portal hypertension                           Would think an elective MR of liver for lesion on                            [place] - need to be sure she can hold breath etc to get                            good exam                           Continue w/d prophylaxis - will need to stop EtOH                           Spoke to daughter - patient will be staying in [place]                            so f/u me for GI and will need PCP  Tx HCV if +                           Leave off anti-coag for now but should be able to                            start if necessary - pending clinical course. Would                            probably wait until on a PPI for 1-2 weeks Procedure Code(s):        --- Professional ---                           [phone], Esophagogastroduodenoscopy, flexible,                            transoral; with biopsy, single or multiple Diagnosis Code(s):        --- Professional ---                           K20.90, Esophagitis, unspecified without bleeding                           K29.70, Gastritis, unspecified, without bleeding                           K29.80, Duodenitis without bleeding                           K92.0, Hematemesis CPT copyright 2019 [employer]. All rights reserved. The codes documented in this report are preliminary and upon coder review may  be revised to meet current compliance requirements. Gatha Mayer, MD 08/03/2019 10:19:59 AM This report has been signed electronically. Number of Addenda: 0

## 2019-08-04 ENCOUNTER — Other Ambulatory Visit: Payer: Self-pay | Admitting: Physician Assistant

## 2019-08-04 ENCOUNTER — Encounter: Payer: Self-pay | Admitting: Gastroenterology

## 2019-08-04 DIAGNOSIS — K2960 Other gastritis without bleeding: Secondary | ICD-10-CM

## 2019-08-04 DIAGNOSIS — R932 Abnormal findings on diagnostic imaging of liver and biliary tract: Secondary | ICD-10-CM

## 2019-08-04 DIAGNOSIS — Z7289 Other problems related to lifestyle: Secondary | ICD-10-CM

## 2019-08-04 LAB — URINALYSIS, ROUTINE W REFLEX MICROSCOPIC
Bacteria, UA: NONE SEEN
Bilirubin Urine: NEGATIVE
Glucose, UA: NEGATIVE mg/dL
Ketones, ur: NEGATIVE mg/dL
Leukocytes,Ua: NEGATIVE
Nitrite: NEGATIVE
Protein, ur: NEGATIVE mg/dL
Specific Gravity, Urine: 1.013 (ref 1.005–1.030)
pH: 7 (ref 5.0–8.0)

## 2019-08-04 LAB — COMPREHENSIVE METABOLIC PANEL
ALT: 86 U/L — ABNORMAL HIGH (ref 0–44)
AST: 158 U/L — ABNORMAL HIGH (ref 15–41)
Albumin: 2.7 g/dL — ABNORMAL LOW (ref 3.5–5.0)
Alkaline Phosphatase: 95 U/L (ref 38–126)
Anion gap: 9 (ref 5–15)
BUN: 9 mg/dL (ref 8–23)
CO2: 26 mmol/L (ref 22–32)
Calcium: 8.6 mg/dL — ABNORMAL LOW (ref 8.9–10.3)
Chloride: 105 mmol/L (ref 98–111)
Creatinine, Ser: 1.43 mg/dL — ABNORMAL HIGH (ref 0.44–1.00)
GFR calc Af Amer: 45 mL/min — ABNORMAL LOW (ref >60)
GFR calc non Af Amer: 39 mL/min — ABNORMAL LOW (ref >60)
Glucose, Bld: 172 mg/dL — ABNORMAL HIGH (ref 70–99)
Potassium: 3.9 mmol/L (ref 3.5–5.1)
Sodium: 140 mmol/L (ref 135–145)
Total Bilirubin: 2.7 mg/dL — ABNORMAL HIGH (ref 0.3–1.2)
Total Protein: 6.5 g/dL (ref 6.5–8.1)

## 2019-08-04 LAB — CBC
HCT: 38.4 % (ref 36.0–46.0)
Hemoglobin: 12.8 g/dL (ref 12.0–15.0)
MCH: 33.5 pg (ref 26.0–34.0)
MCHC: 33.3 g/dL (ref 30.0–36.0)
MCV: 100.5 fL — ABNORMAL HIGH (ref 80.0–100.0)
Platelets: 60 10*3/uL — ABNORMAL LOW (ref 150–400)
RBC: 3.82 MIL/uL — ABNORMAL LOW (ref 3.87–5.11)
RDW: 15.9 % — ABNORMAL HIGH (ref 11.5–15.5)
WBC: 4.1 10*3/uL (ref 4.0–10.5)
nRBC: 0 % (ref 0.0–0.2)

## 2019-08-04 LAB — GLUCOSE, CAPILLARY: Glucose-Capillary: 109 mg/dL — ABNORMAL HIGH (ref 70–99)

## 2019-08-04 LAB — RAPID URINE DRUG SCREEN, HOSP PERFORMED
Amphetamines: NOT DETECTED
Barbiturates: NOT DETECTED
Benzodiazepines: NOT DETECTED
Cocaine: NOT DETECTED
Opiates: NOT DETECTED
Tetrahydrocannabinol: NOT DETECTED

## 2019-08-04 MED ORDER — PANTOPRAZOLE SODIUM 40 MG PO TBEC
40.0000 mg | DELAYED_RELEASE_TABLET | Freq: Two times a day (BID) | ORAL | Status: DC
  Administered 2019-08-04: 40 mg via ORAL
  Filled 2019-08-04: qty 1

## 2019-08-04 MED ORDER — ALUM & MAG HYDROXIDE-SIMETH 200-200-20 MG/5ML PO SUSP
30.0000 mL | ORAL | Status: DC | PRN
  Administered 2019-08-04: 30 mL via ORAL
  Filled 2019-08-04: qty 30

## 2019-08-04 MED ORDER — METOPROLOL SUCCINATE ER 25 MG PO TB24: 25.0000 mg | ORAL_TABLET | Freq: Every day | ORAL | 1 refills | Status: DC

## 2019-08-04 MED ORDER — PANTOPRAZOLE SODIUM 40 MG PO TBEC: 40.0000 mg | DELAYED_RELEASE_TABLET | Freq: Two times a day (BID) | ORAL | 1 refills | Status: DC

## 2019-08-04 NOTE — Progress Notes (Signed)
Pt complains of epigastric discomfort. Maalox ordered from [facility] PRN's previously ordered. Will continue to monitor. Jessie Foot, RN

## 2019-08-04 NOTE — Progress Notes (Signed)
Daily Rounding Note  08/04/2019, 9:17 AM  LOS: 1 day   SUBJECTIVE:   Chief complaint:   Hematemesis, severe esophagitis with gastritis, duodenitis. No nausea.  Appetite not great but tolerating full liquids.  No dysphagia, no chest pressure/pain.  OBJECTIVE:         Vital signs in last 24 hours:    Temp:  [97.8 F (36.6 C)-98.3 F (36.8 C)] 98.1 F (36.7 C) (04/05 0424) Pulse Rate:  [56-66] 61 (04/05 0424) Resp:  [12-20] 16 (04/05 0424) BP: (114-153)/(51-86) 153/85 (04/05 0424) SpO2:  [91 %-98 %] 91 % (04/05 0424) Weight:  [105.3 kg-106 kg] 105.3 kg (04/05 0424) Last BM Date: 08/02/19 Filed Weights   08/03/19 0529 08/03/19 0919 08/04/19 0424  Weight: 106 kg 106 kg 105.3 kg   General: Alert, comfortable, looks somewhat unwell but not acutely ill. Heart: RRR. Chest: Clear bilaterally.  No labored breathing, no cough. Abdomen: Soft.  Not tender.  Not distended.  Active bowel sounds. Extremities: No CCE. Neuro/Psych: Alert.  Calm, pleasant.  Fluid speech.  Fully oriented.  No gross deficits.  No tremors  Intake/Output from previous day: 04/04 0701 - 04/05 0700 In: 720 [P.O.:720] Out: 800 [Urine:800]  Intake/Output this shift: Total I/O In: -  Out: 600 [Urine:600]  Lab Results: Recent Labs    08/02/19 1928 08/03/19 0403 08/04/19 0337  WBC 6.3 4.6 4.1  HGB 12.8 12.9 12.8  HCT 36.9 38.0 38.4  PLT 72* 65* 60*   BMET Recent Labs    08/02/19 0638 08/03/19 0403 08/04/19 0337  NA 141 139 140  K 3.0* 5.1 3.9  CL 100 104 105  CO2 21* 24 26  GLUCOSE 44* 276* 172*  BUN 15 16 9   CREATININE 1.72* 1.56* 1.43*  CALCIUM 9.4 8.2* 8.6*   LFT Recent Labs    08/02/19 0749 08/03/19 0403 08/04/19 0337  PROT 7.1 6.1* 6.5  ALBUMIN 3.3* 2.8* 2.7*  AST 220* 181* 158*  ALT 122* 95* 86*  ALKPHOS 129* 93 95  BILITOT 1.0 2.1* 2.7*  BILIDIR 0.6*  --   --   IBILI 0.4  --   --    PT/INR Recent Labs     08/02/19 0749  LABPROT 19.8*  INR 1.7*   Hepatitis Panel Recent Labs    08/02/19 0749  HEPBSAG NON REACTIVE  HCVAB Reactive*  HEPAIGM NON REACTIVE  HEPBIGM NON REACTIVE    Studies/Results: ECHOCARDIOGRAM COMPLETE  Result Date: 08/02/2019    ECHOCARDIOGRAM REPORT   Patient Name:   Connie [name] Date of Exam: 08/02/2019 Medical Rec #:  [id number]      Height:       67.0 in Accession #:    [id number]     Weight:       229.2 lb Date of Birth:  [birth year]      BSA:          2.143 m Patient Age:    64 years       BP:           126/51 mmHg Patient Gender: F              HR:           77 bpm. Exam Location:  Inpatient Procedure: 2D Echo Indications:    Dyspnea 786.09 / R06.00  History:        Patient has no prior history of Echocardiogram examinations.  Arrythmias:Atrial Fibrillation; Risk Factors:Current Smoker and                 Hypertension. ETOH.  Sonographer:    Leavy Cella Referring Phys: [id number] [place]  1. Left ventricular ejection fraction, by estimation, is 65 to 70%. The left ventricle has hyperdynamic function. The left ventricle has no regional wall motion abnormalities. Left ventricular diastolic parameters were normal.  2. Right ventricular systolic function is normal. The right ventricular size is normal.  3. Left atrial size was mildly dilated.  4. The mitral valve is normal in structure. No evidence of mitral valve regurgitation. No evidence of mitral stenosis.  5. The aortic valve is normal in structure. Aortic valve regurgitation is not visualized. No aortic stenosis is present.  6. The inferior vena cava is normal in size with greater than 50% respiratory variability, suggesting right atrial pressure of 3 mmHg. FINDINGS  Left Ventricle: Left ventricular ejection fraction, by estimation, is 65 to 70%. The left ventricle has hyperdynamic function. The left ventricle has no regional wall motion abnormalities. The left ventricular internal cavity  size was normal in size. There is no concentric left ventricular hypertrophy. Left ventricular diastolic parameters were normal. Normal left ventricular filling pressure. Right Ventricle: The right ventricular size is normal. No increase in right ventricular wall thickness. Right ventricular systolic function is normal. Left Atrium: Left atrial size was mildly dilated. Right Atrium: Right atrial size was normal in size. Pericardium: There is no evidence of pericardial effusion. Mitral Valve: The mitral valve is normal in structure. Normal mobility of the mitral valve leaflets. No evidence of mitral valve regurgitation. No evidence of mitral valve stenosis. Tricuspid Valve: The tricuspid valve is normal in structure. Tricuspid valve regurgitation is not demonstrated. No evidence of tricuspid stenosis. Aortic Valve: The aortic valve is normal in structure. Aortic valve regurgitation is not visualized. No aortic stenosis is present. Aortic valve mean gradient measures 11.0 mmHg. Aortic valve peak gradient measures 20.3 mmHg. Aortic valve area, by VTI measures 2.09 cm. Pulmonic Valve: The pulmonic valve was normal in structure. Pulmonic valve regurgitation is not visualized. No evidence of pulmonic stenosis. Aorta: The aortic root is normal in size and structure. Venous: The inferior vena cava is normal in size with greater than 50% respiratory variability, suggesting right atrial pressure of 3 mmHg. IAS/Shunts: No atrial level shunt detected by color flow Doppler.  LEFT VENTRICLE PLAX 2D LVIDd:         4.70 cm  Diastology LVIDs:         2.50 cm  LV e' lateral:   12.10 cm/s LV PW:         1.20 cm  LV E/e' lateral: 7.2 LV IVS:        1.10 cm  LV e' medial:    9.25 cm/s LVOT diam:     2.18 cm  LV E/e' medial:  9.4 LV SV:         88 LV SV Index:   41 LVOT Area:     3.73 cm  RIGHT VENTRICLE RV S prime:     24.40 cm/s TAPSE (M-mode): 3.2 cm LEFT ATRIUM             Index       RIGHT ATRIUM           Index LA diam:         4.00 cm 1.87 cm/m  RA Area:     14.00 cm LA Vol (  A2C):   62.9 ml 29.35 ml/m RA Volume:   34.40 ml  16.05 ml/m LA Vol (A4C):   63.9 ml 29.82 ml/m LA Biplane Vol: 69.1 ml 32.25 ml/m  AORTIC VALVE AV Area (Vmax):    2.17 cm AV Area (Vmean):   2.17 cm AV Area (VTI):     2.09 cm AV Vmax:           225.32 cm/s AV Vmean:          149.005 cm/s AV VTI:            0.420 m AV Peak Grad:      20.3 mmHg AV Mean Grad:      11.0 mmHg LVOT Vmax:         131.27 cm/s LVOT Vmean:        86.699 cm/s LVOT VTI:          0.235 m LVOT/AV VTI ratio: 0.56  AORTA Ao Root diam: 2.60 cm MITRAL VALVE MV Area (PHT): 3.03 cm    SHUNTS MV Decel Time: 250 msec    Systemic VTI:  0.24 m MV E velocity: 87.40 cm/s  Systemic Diam: 2.18 cm MV A velocity: 47.60 cm/s MV E/A ratio:  1.84 Mihai Croitoru MD Electronically signed by Sanda Klein MD Signature Date/Time: 08/02/2019/3:14:50 PM    Final    US Abdomen Limited RUQ  Result Date: 08/02/2019 CLINICAL DATA:  Elevated LFTs EXAM: ULTRASOUND ABDOMEN LIMITED RIGHT UPPER QUADRANT COMPARISON:  None. FINDINGS: Gallbladder: No gallstones or wall thickening visualized. No sonographic Murphy sign noted by sonographer. Common bile duct: Diameter: Not visualized due to body habitus, difficulty positioning the patient common overlying bowel gas Liver: Heterogeneous increased liver echogenicity. Central liver hypoechoic indeterminate lesion noted measuring 1.7 x 1.9 x 2.5 cm adjacent/anterior to the porta hepatis. This lesion may be cystic but difficult to confirm with this suboptimal ultrasound. Liver surface does appear nodular suggesting cirrhosis. No intrahepatic biliary dilatation. Portal vein is patent on color Doppler imaging with normal direction of blood flow towards the liver. Other: No ascites or free fluid. Included views of the right kidney demonstrate no hydronephrosis. IMPRESSION: No gallbladder abnormality, cholelithiasis or biliary obstruction Suboptimal exam as above.  Underlying hepatic  cirrhosis suspected. 2.5 cm indeterminate central hypoechoic hepatic lesion. See above comment. Electronically Signed   By: Jerilynn Mages.  Shick M.D.   On: 08/02/2019 11:31    ASSESMENT:   *    Hematemesis.  08/03/2019 EGD with black pigmented erosive esophagitis w bleeding.  Gastritis, duodenitis.  Biopsies obtained. Now on Protonix 40 IV bid.   *   Indeterminant 1.7 x 1.9 x 2.5 cm central liver lesion.  Ultrasound suspicious for cirrhosis LFTs steadily improving with exception of rising T bili. HCV positive, undetectable HCV RNA.  INR 1.7.  *   ETOH abuse, elevated etoh level at admission.  Pattern of EtOH consumption is up to 1/5 brandy over 1 to 2 days on her days off.  Does not drink on the days she works.Marland Kitchen   LFTs improving, AST >> ALT.  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    *    A. fib, RVR, converted spontaneously.  CHA2DS2-VASc score 2 so long-term anticoagulation indicated.  *    AKI.  Improved.    *   Macrocytosis, no anemia.     PLAN   *  Check AFP level.  Follow-up pathology.  *   Switch to Protonix 40 po bid.   patient prefers to stay on  full liquids for now but eventually can advance to soft, carb modified diet.  *   Per Dr. Sallyanne Kuster, cardiology, start [place] when GI feels risk of bleeding sufficiently low.    Azucena Freed  08/04/2019, 9:17 AM Phone [phone]

## 2019-08-04 NOTE — Progress Notes (Signed)
Pt stable, dc home via wheelchair 

## 2019-08-04 NOTE — Care Management (Signed)
1049 08-04-19 Patient presented for Atrial Fib. Patient was visiting daughter from [place]. Patient is not sure when she return home. Case Manager did speak with patient to make hospital follow up appointment at one of the indigent clinics, and she is agreeable. Patient is without insurance at this time. Appointment placed on the AVS and patient will be able to use the outpatient pharmacy at this location. Medications will be delivered today from the [facility]. No further needs identified at this time. Connie Roys, RN,BSN Case Manager

## 2019-08-04 NOTE — Progress Notes (Addendum)
Inpatient Diabetes Program Recommendations  AACE/ADA: New Consensus Statement on Inpatient Glycemic Control (2015)  Target Ranges:  Prepandial:   less than 140 mg/dL      Peak postprandial:   less than 180 mg/dL (1-2 hours)      Critically ill patients:  140 - 180 mg/dL   Results for Connie [name], Connie [name] (MRN [id number]) as of 08/04/2019 07:26  Ref. Range 08/02/2019 08:07 08/02/2019 08:55 08/02/2019 11:47 08/02/2019 12:03 08/02/2019 12:40  Glucose-Capillary Latest Ref Range: 70 - 99 mg/dL 31 (LL) 106 (H) 42 (LL) 161 (H) 89   Results for Connie [name], Connie [name] (MRN [id number]) as of 08/04/2019 07:26  Ref. Range 08/03/2019 08:23 08/03/2019 16:08 08/03/2019 20:58  Glucose-Capillary Latest Ref Range: 70 - 99 mg/dL 267 (H) 162 (H)  1 unit NOVOLOG  131 (H)   Results for Connie [name], Connie [name] (MRN [id number]) as of 08/04/2019 07:26  Ref. Range 08/02/2019 14:54  Hemoglobin A1C Latest Ref Range: 4.8 - 5.6 % 5.6    Admit with: Atrial fibrillation with RVR/ CP/ SOB/ Hematemesis  History: Suspected ETOH Abuse, HTN  Current Insulin Orders: Novolog 0-6 units TID with meals      No History of Diabetes noted--Current A1c of 5.6%  Hypoglycemic on admission followed by random glucose elevation--Unsure what caused pt's CBGs to be elevated yest AM?  Note low dose Novolog SSi added--agree.   --Will follow patient during hospitalization--  Wyn Quaker RN, MSN, CDE Diabetes Coordinator Inpatient Glycemic Control Team Team Pager: [phone] (8a-5p)

## 2019-08-04 NOTE — Progress Notes (Signed)
   Patient dressed and ready for discharge from IM team. Telemetry removed. Have arranged f/u in our office 08/27/19 at 8:45am with Almyra Deforest, one of our PAs. AVS has already been printed so I hand-wrote this on patient's paper copy with our phone number and address as well. Patient verbalized understanding and gratitude. Tyger Wichman PA-C

## 2019-08-04 NOTE — Progress Notes (Signed)
Subjective:   Patient evaluated at bedside. She reports feeling better this morning and is eager to leave hospital. Discussed outpatient follow up with PCP, Cardiology and GI. Also discussed need for follow up hepatobiliary imaging.  Patient agrees with discharge planning.   Objective: CBC Latest Ref Rng & Units 08/04/2019 08/03/2019 08/02/2019  WBC 4.0 - 10.5 K/uL 4.1 4.6 6.3  Hemoglobin 12.0 - 15.0 g/dL 12.8 12.9 12.8  Hematocrit 36.0 - 46.0 % 38.4 38.0 36.9  Platelets 150 - 400 K/uL 60(L) 65(L) 72(L)   BMP Latest Ref Rng & Units 08/04/2019 08/03/2019 08/02/2019  Glucose 70 - 99 mg/dL 172(H) 276(H) 44(LL)  BUN 8 - 23 mg/dL 9 16 15   Creatinine 0.44 - 1.00 mg/dL 1.43(H) 1.56(H) 1.72(H)  Sodium 135 - 145 mmol/L 140 139 141  Potassium 3.5 - 5.1 mmol/L 3.9 5.1 3.0(L)  Chloride 98 - 111 mmol/L 105 104 100  CO2 22 - 32 mmol/L 26 24 21(L)  Calcium 8.9 - 10.3 mg/dL 8.6(L) 8.2(L) 9.4   Vital signs in last 24 hours: Vitals:   08/03/19 1225 08/03/19 1607 08/03/19 1944 08/04/19 0424  BP: 119/81 (!) 148/86 135/75 (!) 153/85  Pulse: (!) 56 (!) 57 66 61  Resp: 13  20 16   Temp:  98.3 F (36.8 C) 98.3 F (36.8 C) 98.1 F (36.7 C)  TempSrc:  Oral Oral Oral  SpO2: 97% 96% 92% 91%  Weight:    105.3 kg  Height:       Physical Exam General: Resting in bed comfortably HEENT: NCAT CV: Sinus rhythm.  Normal S1-S2 no murmurs rubs or gallops appreciated PULM: Clear in all lung fields ABD: Soft and nontender in all quadrants NEURO: Alert and oriented, no focal deficits  Assessment/Plan:  Active Problems:   Atrial fibrillation with RVR (HCC)   Hematemesis with nausea   Alcoholic cirrhosis of liver without ascites (HCC)   Hepatitis C antibody positive in blood   Erosive esophagitis   Erosive gastritis  In summary, [name] is a 64 year old female with history of untreated hypertension, tobacco use and suspected EtOH use disorder who presented with SOB, chest pain and palpitations and hematemesis.  She was noted to be in A. fib with RVR. On initial labs, it was noted that she had an elevated EtOH, transaminitis, and elevated PT/INR, raising concerns for hepatic injury secondary to alcohol use. Hematemesis concerning for bleeding esophageal varices.  #A-fib w/ RVR #Chest Pain #SOB #Troponemia: Patient presented with A. fib with RVR with heart rate in the 130s. Spontaneously converted back to sinus rhythm after diltiazem drip. Cardiology is on board.  Currently on p.o. metoprolol in sinus rhythm.  Troponinemia to 33 and 50 on initial lab, likely demand ischemia in the setting of A. Fib. CHADSVasc = 2 and should be anticoagulated long term. Echocardiogram unremarkable, showed no evidence of thrombus. -Cardiology is consulted, will appreciate recommendations   -Continue metoprolol 25 mg daily             -Start DOAC once GI feels bleeding risk is minimized  -F/u in outpatient setting -Avoid amiodarone due to transaminitis              #Esophititis #Gastritits #Duodenitits #Hematemsis: Pt had single episode of hematemesis in the ED after being started on heparin while she was in A-fib w/ RVR.  Heparin was stopped, and GI performed an EGD on 4/4, which showed black pigmented erosive esophagitis, gastritis, and duodenitis. Biopsies were taken of the erosive lesions. No signs  of portal hypertension or esophageal varices.   -Appreciate GI reccomendations  -Protonix 40 mg twice daily  -Hold off on anticoagulation for a-fib until pt has been on PPI for 1-2 weeks  -F/u in outpatient setting  #EtOH use disorder #Transaminitis  #Elevated PT/INR #Liver Lesion: EtOH elevated to 235 on admission.  Patient states that she drank alcohol the night before but did not quantify how many drinks she had.  States probably more than 5. PT and INR to 19.8 and 1.7 respectively. AST and ALT downtrending to 181/95 from 220/122.  I suspect this is secondary to excessive alcohol use. No hx of EtOH withdrawal symptoms  or seizures. No signs of withdrawal here. Hep C antibody positive and RNA is negative. Right upper quadrant ultrasound did show 2.5cm indeterminate central hypoechoic lesion in liver. Discussed this with radiology and they recommend f/u MRI hepatic protocol, previably in the outpatient setting. -F/u MRI hepatic protocol in the outpatient setting  #Hyperglycemia: Patient had elevated glucose to 276 and 267 4 hours apart this morning. A1c was 5.6 on admission.  Patient has no history of diabetes. -Continue to monitor, may need SSI -F/u with PCP  #HTN: Patient states she does not like doctors and has not taken any antihypertensive medication since being diagnosed with high blood pressures 10 years ago.  The patient is currently normotensive. -Continue to monitor  #FEN/GI -Diet: Carb modified -Fluids: none -Zofran 4 mg every 6 hours as needed -Daily BMP  #DVT prophylaxis -SCDs  #CODE STATUS: FULL  #Dispo: Home today Prior to Admission Living Arrangement: Home Anticipated Discharge Location: Home Barriers to Discharge:none  Earlene Plater, MD Internal Medicine, PGY1 Pager: [phone]  08/04/2019,10:51 AM

## 2019-08-04 NOTE — Discharge Summary (Signed)
Name: Connie [name] MRN: [id number] DOB: [birth year] 64 y.o. PCP: Patient, No Pcp Per  Date of Admission: 08/02/2019  6:37 AM Date of Discharge: 08/04/2019 Attending Physician: Aldine Contes, MD  Discharge Diagnosis: 1. A-fib w/ RVR 2. 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 Hematemesis Secondary to Esophagitis, Gastritis and Duodenitis  Discharge Medications: Allergies as of 08/04/2019   No Known Allergies     Medication List    TAKE these medications   metoprolol succinate 25 MG 24 hr tablet Commonly known as: TOPROL-XL Take 1 tablet (25 mg total) by mouth daily.   pantoprazole 40 MG tablet Commonly known as: Protonix Take 1 tablet (40 mg total) by mouth 2 (two) times daily.      Disposition and follow-up:   Connie [name] was discharged from [facility] in fair condition.  At the hospital follow up visit please address:  1. A-fib w/ RVR: Patient initially presented with shortness of breath, chest pain, and palpitations and was noted to be in A. fib with RVR.  Mild troponinemia to 33 and 50 on initial labs, likely demand ischemia in the setting of A. fib. Patient spontaneously converted to sinus rhythm after diltiazem drip and was transitioned to p.o. metoprolol.  Anticoagulation has been withheld due to hematemesis shortly after starting heparin.  CHA2DS2-VASc or 2, needs anticoagulation long-term. -Metoprolol 25 mg daily -No anticoagulation for a-fib until the patient's been on Protonix for 2 weeks. -F/u with cardiology in outpatient setting  2. 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 Right upper quadrant ultrasound was obtained after initial labs demonstrated transaminitis (AST 220/ ALT 122). Showed hypoechoic indeterminate lesion noted measuring 1.7 x 1.9 x 2.5 cm -May consider naltrexone to decrease alcohol intake -Needs outpatient MRI hepatic protocol   3. Hematemesis Secondary to Esophagitis, Gastritis and Duodenitis: Patient vomited blood  after being started on heparin for A. fib with RVR.  Given newly discovered cirrhosis, there was concern for esophageal variceal bleed.  GI performed endoscopy, which did not show overt evidence of portal hypertension, but showed esophagitis, gastritis and duodenitis.  Several biopsies were taken and are currently pending. -Protonix 40 mg twice daily for 2 weeks prior to anticoagulation for A. fib -F/u with GI in the outpatient setting -F/u biopsy results  4. PCP follow up: Pt needs to establish PCP.  5.  Labs / imaging needed at time of follow-up: MRI Hepatic protocol  6.  Pending labs/ test needing follow-up: EGD biopsies  Follow-up Appointments:   Hospital Course by problem list: 1. A-fib w/ RVR 2. 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 Hematemesis Secondary to Esophagitis, Gastritis and Duodenitis  In summary, Ms. Owens [name] is a 64 year old female with history of untreated hypertension, tobacco use and suspected EtOH use disorderwho presented with SOB, chest pain and palpitationsand hematemesis. She was noted to be in A. fib with RVR. On initial labs, it was noted that she hadan elevated EtOH (235), transaminitis (AST/ALT 220/122), and elevated PT/INR (19.8/1.7), raising concerns for hepatic injury secondary to alcohol use.  The patient was initially put on a diltiazem drip for her A. fib with RVR, and quickly converted back to sinus rhythm.  She was subsequently placed on heparin drip, and shortly after vomited bright red blood.  Prior to this episode of hematemesis, the patient underwent a right upper quadrant ultrasound to evaluate transaminitis and was found to have an hypoechoic indeterminate lesion measuring 1.7 x 1.9 x 2.5 cm.  Radiology recommended follow-up MRI hepatic protocol imaging in  the outpatient setting. Out of concern for esophageal variceal bleed, GI was consulted and the patient was put on octreotide and ceftriaxone.  She subsequently underwent EGD the following day which did not show  any overt evidence of portal hypertension.  There was esophagitis, gastritis, and duodenitis noted.  Biopsies were taken and are currently pending.  On the day of discharge, the patient was human dynamically stable and in normal sinus rhythm and was well controlled with p.o. metoprolol 25 mg daily.  She was instructed to follow-up with cardiology, gastroenterology, and establish care with a primary care provider for ongoing management.  The patient was in agreement with this plan upon discharge.  Discharge Vitals:   BP (!) 153/85 (BP Location: Left Arm)   Pulse 61   Temp 98.1 F (36.7 C) (Oral)   Resp 16   Ht 5\' 7"  (1.702 m)   Wt 105.3 kg   SpO2 91%   BMI 36.36 kg/m   Pertinent Labs, Studies, and Procedures:  CBC Latest Ref Rng & Units 08/04/2019 08/03/2019 08/02/2019  WBC 4.0 - 10.5 K/uL 4.1 4.6 6.3  Hemoglobin 12.0 - 15.0 g/dL 12.8 12.9 12.8  Hematocrit 36.0 - 46.0 % 38.4 38.0 36.9  Platelets 150 - 400 K/uL 60(L) 65(L) 72(L)   CMP Latest Ref Rng & Units 08/04/2019 08/03/2019 08/02/2019  Glucose 70 - 99 mg/dL 172(H) 276(H) 44(LL)  BUN 8 - 23 mg/dL 9 16 15   Creatinine 0.44 - 1.00 mg/dL 1.43(H) 1.56(H) 1.72(H)  Sodium 135 - 145 mmol/L 140 139 141  Potassium 3.5 - 5.1 mmol/L 3.9 5.1 3.0(L)  Chloride 98 - 111 mmol/L 105 104 100  CO2 22 - 32 mmol/L 26 24 21(L)  Calcium 8.9 - 10.3 mg/dL 8.6(L) 8.2(L) 9.4  Total Protein 6.5 - 8.1 g/dL 6.5 6.1(L) 7.1  Total Bilirubin 0.3 - 1.2 mg/dL 2.7(H) 2.1(H) 1.0  Alkaline Phos 38 - 126 U/L 95 93 129(H)  AST 15 - 41 U/L 158(H) 181(H) 220(H)  ALT 0 - 44 U/L 86(H) 95(H) 122(H)   PT/INR = 19.8/1.7  CXR: IMPRESSION: Negative portable chest.  RUQ U/S: FINDINGS: Gallbladder:  No gallstones or wall thickening visualized. No sonographic Murphy sign noted by sonographer.  Common bile duct:  Diameter: Not visualized due to body habitus, difficulty positioning the patient common overlying bowel gas  Liver:  Heterogeneous increased liver  echogenicity. Central liver hypoechoic indeterminate lesion noted measuring 1.7 x 1.9 x 2.5 cm adjacent/anterior to the porta hepatis. This lesion may be cystic but difficult to confirm with this suboptimal ultrasound. Liver surface does appear nodular suggesting cirrhosis. No intrahepatic biliary dilatation.  Portal vein is patent on color Doppler imaging with normal direction of blood flow towards the liver.  Other: No ascites or free fluid. Included views of the right kidney demonstrate no hydronephrosis.  IMPRESSION: 1. No gallbladder abnormality, cholelithiasis or biliary obstruction 2. Suboptimal exam as above.  Underlying hepatic cirrhosis suspected. 3. 2.5 cm indeterminate central hypoechoic hepatic lesion. See above comment.  Discharge Instructions: Discharge Instructions    Amb referral to [facility]   Complete by: As directed    Ambulatory referral to Cardiology   Complete by: As directed    New onset a-fib   Ambulatory referral to Gastroenterology   Complete by: As directed    Call MD for:  difficulty breathing, headache or visual disturbances   Complete by: As directed    Call MD for:  extreme fatigue   Complete by: As directed  Call MD for:  hives   Complete by: As directed    Call MD for:  persistant dizziness or light-headedness   Complete by: As directed    Call MD for:  persistant nausea and vomiting   Complete by: As directed    Call MD for:  severe uncontrolled pain   Complete by: As directed    Call MD for:  temperature >100.4   Complete by: As directed    Diet - low sodium heart healthy   Complete by: As directed    Discharge instructions   Complete by: As directed    Thank you for allowing [place] to take care of you. Below is a summary of what we treated:  1. A-fib - Take metoprolol 25 mg daily -You will ultimately need to be put on blood thinners once the bleeding in your stomach resolves. The cardiologit will help you manage this.  - A  refferal was provided to your cardiologist. Please follow up with them.   2. Vomiting blood - The GI doctor put a camera in your stomach and found inflammation, that was likely the source of you bleeding. They took biopsies of these lesions and you will need to follow up to get further treatment. -Take Protonix 40 mg twice a day -Please follow up with GI doctors to follow this up.  3. Liver lesion - You have a lesion on your liver that was discovered on an ultrasound. You will need an outpatient MRI to find out what this is.  4. Alcohol Use - Your liver labs show that your liver is injured. You have cirrhosis (or scarring) of your liver likely due to alcohol use.  -We recommend you stop drinking alcohol  5. Follow up  -Please follow up with the cardiologist and GI doctors when you leave the hospital -Everyone needs a primary doctor to help get you what you need. Please find a primary doctor to follow up with once you leave the hospital.   Increase activity slowly   Complete by: As directed      Signed: Earlene Plater, MD Internal Medicine, PGY1 Pager: [phone]  08/04/2019,3:58 PM

## 2019-08-06 ENCOUNTER — Other Ambulatory Visit: Payer: Self-pay

## 2019-08-06 LAB — SURGICAL PATHOLOGY

## 2019-08-07 ENCOUNTER — Encounter: Payer: Self-pay | Admitting: Internal Medicine

## 2019-08-07 ENCOUNTER — Other Ambulatory Visit: Payer: Self-pay

## 2019-08-07 DIAGNOSIS — B9681 Helicobacter pylori [H. pylori] as the cause of diseases classified elsewhere: Secondary | ICD-10-CM

## 2019-08-07 DIAGNOSIS — K2970 Gastritis, unspecified, without bleeding: Secondary | ICD-10-CM | POA: Insufficient documentation

## 2019-08-07 DIAGNOSIS — A048 Other specified bacterial intestinal infections: Secondary | ICD-10-CM

## 2019-08-07 HISTORY — DX: Gastritis, unspecified, without bleeding: K29.70

## 2019-08-07 HISTORY — DX: Helicobacter pylori (H. pylori) as the cause of diseases classified elsewhere: B96.81

## 2019-08-07 MED ORDER — DOXYCYCLINE HYCLATE 100 MG PO CAPS: 100.0000 mg | ORAL_CAPSULE | Freq: Two times a day (BID) | ORAL | 0 refills | Status: AC

## 2019-08-07 MED ORDER — BISMUTH SUBSALICYLATE 262 MG PO CHEW: 524.0000 mg | CHEWABLE_TABLET | Freq: Four times a day (QID) | ORAL | 0 refills | Status: AC

## 2019-08-07 MED ORDER — OMEPRAZOLE 20 MG PO CPDR: 20.0000 mg | DELAYED_RELEASE_CAPSULE | Freq: Two times a day (BID) | ORAL | 0 refills | Status: DC

## 2019-08-07 MED ORDER — METRONIDAZOLE 250 MG PO TABS: 250.0000 mg | ORAL_TABLET | Freq: Four times a day (QID) | ORAL | 0 refills | Status: AC

## 2019-08-27 ENCOUNTER — Ambulatory Visit: Payer: Self-pay | Admitting: Physician Assistant

## 2019-09-02 ENCOUNTER — Ambulatory Visit: Payer: Self-pay | Admitting: Gastroenterology

## 2019-09-19 ENCOUNTER — Encounter: Payer: Self-pay | Admitting: Physician Assistant

## 2019-09-21 NOTE — Progress Notes (Signed)
This encounter was created in error - please disregard.

## 2019-10-20 ENCOUNTER — Encounter: Payer: Self-pay | Admitting: General Practice

## 2021-02-02 ENCOUNTER — Telehealth: Payer: Self-pay

## 2021-02-02 ENCOUNTER — Other Ambulatory Visit: Payer: Self-pay | Admitting: Registered Nurse

## 2021-02-02 DIAGNOSIS — E2839 Other primary ovarian failure: Secondary | ICD-10-CM

## 2021-02-02 NOTE — Telephone Encounter (Signed)
NOTES SCANNED TO REFERRAL 

## 2021-02-08 ENCOUNTER — Other Ambulatory Visit: Payer: Self-pay | Admitting: Nurse Practitioner

## 2021-02-08 DIAGNOSIS — D376 Neoplasm of uncertain behavior of liver, gallbladder and bile ducts: Secondary | ICD-10-CM

## 2021-02-21 ENCOUNTER — Encounter (HOSPITAL_COMMUNITY): Payer: Self-pay

## 2021-02-21 ENCOUNTER — Inpatient Hospital Stay (HOSPITAL_COMMUNITY)
Admission: EM | Admit: 2021-02-21 | Discharge: 2021-03-06 | DRG: 439 | Disposition: A | Discharge status: Home / Self Care | Payer: Medicare Other | Attending: Internal Medicine | Admitting: Internal Medicine

## 2021-02-21 DIAGNOSIS — N1832 Chronic kidney disease, stage 3b: Secondary | ICD-10-CM | POA: Diagnosis present

## 2021-02-21 DIAGNOSIS — K859 Acute pancreatitis without necrosis or infection, unspecified: Secondary | ICD-10-CM

## 2021-02-21 DIAGNOSIS — D6489 Other specified anemias: Secondary | ICD-10-CM | POA: Diagnosis present

## 2021-02-21 DIAGNOSIS — K5730 Diverticulosis of large intestine without perforation or abscess without bleeding: Secondary | ICD-10-CM | POA: Diagnosis present

## 2021-02-21 DIAGNOSIS — R509 Fever, unspecified: Secondary | ICD-10-CM

## 2021-02-21 DIAGNOSIS — C249 Malignant neoplasm of biliary tract, unspecified: Secondary | ICD-10-CM

## 2021-02-21 DIAGNOSIS — I480 Paroxysmal atrial fibrillation: Secondary | ICD-10-CM | POA: Diagnosis present

## 2021-02-21 DIAGNOSIS — K5900 Constipation, unspecified: Secondary | ICD-10-CM | POA: Diagnosis present

## 2021-02-21 DIAGNOSIS — N390 Urinary tract infection, site not specified: Secondary | ICD-10-CM | POA: Diagnosis present

## 2021-02-21 DIAGNOSIS — Z20822 Contact with and (suspected) exposure to covid-19: Secondary | ICD-10-CM | POA: Diagnosis present

## 2021-02-21 DIAGNOSIS — K7460 Unspecified cirrhosis of liver: Secondary | ICD-10-CM

## 2021-02-21 DIAGNOSIS — K863 Pseudocyst of pancreas: Secondary | ICD-10-CM | POA: Diagnosis present

## 2021-02-21 DIAGNOSIS — N183 Chronic kidney disease, stage 3 unspecified: Secondary | ICD-10-CM | POA: Diagnosis present

## 2021-02-21 DIAGNOSIS — R109 Unspecified abdominal pain: Secondary | ICD-10-CM

## 2021-02-21 DIAGNOSIS — Z6836 Body mass index (BMI) 36.0-36.9, adult: Secondary | ICD-10-CM

## 2021-02-21 DIAGNOSIS — N179 Acute kidney failure, unspecified: Secondary | ICD-10-CM | POA: Diagnosis present

## 2021-02-21 DIAGNOSIS — K7030 Alcoholic cirrhosis of liver without ascites: Secondary | ICD-10-CM | POA: Diagnosis present

## 2021-02-21 DIAGNOSIS — E871 Hypo-osmolality and hyponatremia: Secondary | ICD-10-CM | POA: Diagnosis present

## 2021-02-21 DIAGNOSIS — R772 Abnormality of alphafetoprotein: Secondary | ICD-10-CM | POA: Diagnosis present

## 2021-02-21 DIAGNOSIS — R140 Abdominal distension (gaseous): Secondary | ICD-10-CM

## 2021-02-21 DIAGNOSIS — R3129 Other microscopic hematuria: Secondary | ICD-10-CM | POA: Diagnosis present

## 2021-02-21 DIAGNOSIS — E860 Dehydration: Secondary | ICD-10-CM

## 2021-02-21 DIAGNOSIS — K8500 Idiopathic acute pancreatitis without necrosis or infection: Secondary | ICD-10-CM | POA: Diagnosis not present

## 2021-02-21 DIAGNOSIS — Z801 Family history of malignant neoplasm of trachea, bronchus and lung: Secondary | ICD-10-CM

## 2021-02-21 DIAGNOSIS — E876 Hypokalemia: Secondary | ICD-10-CM | POA: Diagnosis present

## 2021-02-21 DIAGNOSIS — F17200 Nicotine dependence, unspecified, uncomplicated: Secondary | ICD-10-CM | POA: Diagnosis present

## 2021-02-21 DIAGNOSIS — I444 Left anterior fascicular block: Secondary | ICD-10-CM | POA: Diagnosis present

## 2021-02-21 DIAGNOSIS — Z8505 Personal history of malignant neoplasm of liver: Secondary | ICD-10-CM

## 2021-02-21 DIAGNOSIS — D6959 Other secondary thrombocytopenia: Secondary | ICD-10-CM | POA: Diagnosis not present

## 2021-02-21 DIAGNOSIS — Z7682 Awaiting organ transplant status: Secondary | ICD-10-CM

## 2021-02-21 DIAGNOSIS — K7031 Alcoholic cirrhosis of liver with ascites: Secondary | ICD-10-CM

## 2021-02-21 LAB — COMPREHENSIVE METABOLIC PANEL
ALT: 67 U/L — ABNORMAL HIGH (ref 0–44)
AST: 102 U/L — ABNORMAL HIGH (ref 15–41)
Albumin: 3.4 g/dL — ABNORMAL LOW (ref 3.5–5.0)
Alkaline Phosphatase: 89 U/L (ref 38–126)
Anion gap: 11 (ref 5–15)
BUN: 21 mg/dL (ref 8–23)
CO2: 24 mmol/L (ref 22–32)
Calcium: 7.4 mg/dL — ABNORMAL LOW (ref 8.9–10.3)
Chloride: 101 mmol/L (ref 98–111)
Creatinine, Ser: 1.51 mg/dL — ABNORMAL HIGH (ref 0.44–1.00)
GFR, Estimated: 38 mL/min — ABNORMAL LOW (ref >60)
Glucose, Bld: 115 mg/dL — ABNORMAL HIGH (ref 70–99)
Potassium: 3.2 mmol/L — ABNORMAL LOW (ref 3.5–5.1)
Sodium: 136 mmol/L (ref 135–145)
Total Bilirubin: 3.3 mg/dL — ABNORMAL HIGH (ref 0.3–1.2)
Total Protein: 7.5 g/dL (ref 6.5–8.1)

## 2021-02-21 LAB — CBC WITH DIFFERENTIAL/PLATELET
Abs Immature Granulocytes: 0.11 10*3/uL — ABNORMAL HIGH (ref 0.00–0.07)
Basophils Absolute: 0.1 10*3/uL (ref 0.0–0.1)
Basophils Relative: 1 %
Eosinophils Absolute: 0.2 10*3/uL (ref 0.0–0.5)
Eosinophils Relative: 1 %
HCT: 49 % — ABNORMAL HIGH (ref 36.0–46.0)
Hemoglobin: 16.6 g/dL — ABNORMAL HIGH (ref 12.0–15.0)
Immature Granulocytes: 1 %
Lymphocytes Relative: 20 %
Lymphs Abs: 3 10*3/uL (ref 0.7–4.0)
MCH: 33.1 pg (ref 26.0–34.0)
MCHC: 33.9 g/dL (ref 30.0–36.0)
MCV: 97.6 fL (ref 80.0–100.0)
Monocytes Absolute: 1.1 10*3/uL — ABNORMAL HIGH (ref 0.1–1.0)
Monocytes Relative: 7 %
Neutro Abs: 11 10*3/uL — ABNORMAL HIGH (ref 1.7–7.7)
Neutrophils Relative %: 70 %
Platelets: 102 10*3/uL — ABNORMAL LOW (ref 150–400)
RBC: 5.02 MIL/uL (ref 3.87–5.11)
RDW: 15.2 % (ref 11.5–15.5)
WBC: 15.4 10*3/uL — ABNORMAL HIGH (ref 4.0–10.5)
nRBC: 0.2 % (ref 0.0–0.2)

## 2021-02-21 LAB — LIPASE, BLOOD: Lipase: 213 U/L — ABNORMAL HIGH (ref 11–51)

## 2021-02-21 MED ORDER — ONDANSETRON HCL 4 MG/2ML IJ SOLN
4.0000 mg | Freq: Once | INTRAMUSCULAR | Status: AC
  Administered 2021-02-22: 4 mg via INTRAVENOUS
  Filled 2021-02-21: qty 2

## 2021-02-21 MED ORDER — SODIUM CHLORIDE 0.9 % IV BOLUS
250.0000 mL | Freq: Once | INTRAVENOUS | Status: AC
  Administered 2021-02-22: 250 mL via INTRAVENOUS

## 2021-02-21 MED ORDER — MORPHINE SULFATE (PF) 4 MG/ML IV SOLN
4.0000 mg | Freq: Once | INTRAVENOUS | Status: AC
  Administered 2021-02-22: 4 mg via INTRAVENOUS
  Filled 2021-02-21: qty 1

## 2021-02-21 NOTE — ED Triage Notes (Signed)
Pt presents with c/o right flank pain for 3 days with minimal urination for 3 days. Pt also has poor oral intake for 3 days. Pt has a hx of kidney disease.

## 2021-02-21 NOTE — ED Provider Notes (Signed)
Emergency Medicine Provider Triage Evaluation Note  Connie [name] , a 65 y.o. female  was evaluated in triage.  Pt complains of right flank pain x3 days associated with decreased urination.  No hematuria or dysuria.  No fever or chills.  Denies chest pain and shortness of breath.  No injury to right flank.  No previous kidney stones.  Denies rash. Patient has a history of chronic kidney disease.  Not currently on dialysis.  Patient admits to decreased p.o. intake over the past 3 days.  Review of Systems  Positive: Flank pain Negative: CP, SOB  Physical Exam  There were no vitals taken for this visit. Gen:   Awake, no distress   Resp:  Normal effort  MSK:   Moves extremities without difficulty  Other:  +CVA tenderness TTP in RUQ  Medical Decision Making  Medically screening exam initiated at 3:05 PM.  Appropriate orders placed.  Dezaray Shibuya was informed that the remainder of the evaluation will be completed by another provider, this initial triage assessment does not replace that evaluation, and the importance of remaining in the ED until their evaluation is complete.  Gallbladder etiology vs. Kidney stone Abdominal labs CT abdomen, may need RUQ [place] if CT unremarkable.   Suzy Bouchard, PA-C 02/21/21 [place], [place], DO 02/21/21 [id number]

## 2021-02-22 ENCOUNTER — Emergency Department (HOSPITAL_COMMUNITY): Payer: Medicare Other

## 2021-02-22 ENCOUNTER — Inpatient Hospital Stay (HOSPITAL_COMMUNITY): Payer: Medicare Other

## 2021-02-22 ENCOUNTER — Encounter (HOSPITAL_COMMUNITY): Payer: Self-pay

## 2021-02-22 DIAGNOSIS — E860 Dehydration: Secondary | ICD-10-CM | POA: Diagnosis present

## 2021-02-22 DIAGNOSIS — K859 Acute pancreatitis without necrosis or infection, unspecified: Secondary | ICD-10-CM | POA: Diagnosis present

## 2021-02-22 DIAGNOSIS — Z801 Family history of malignant neoplasm of trachea, bronchus and lung: Secondary | ICD-10-CM | POA: Diagnosis not present

## 2021-02-22 DIAGNOSIS — I480 Paroxysmal atrial fibrillation: Secondary | ICD-10-CM | POA: Diagnosis present

## 2021-02-22 DIAGNOSIS — N183 Chronic kidney disease, stage 3 unspecified: Secondary | ICD-10-CM | POA: Diagnosis not present

## 2021-02-22 DIAGNOSIS — R109 Unspecified abdominal pain: Secondary | ICD-10-CM | POA: Diagnosis not present

## 2021-02-22 DIAGNOSIS — N390 Urinary tract infection, site not specified: Secondary | ICD-10-CM | POA: Diagnosis present

## 2021-02-22 DIAGNOSIS — R1084 Generalized abdominal pain: Secondary | ICD-10-CM | POA: Diagnosis not present

## 2021-02-22 DIAGNOSIS — Z6836 Body mass index (BMI) 36.0-36.9, adult: Secondary | ICD-10-CM | POA: Diagnosis not present

## 2021-02-22 DIAGNOSIS — I444 Left anterior fascicular block: Secondary | ICD-10-CM | POA: Diagnosis present

## 2021-02-22 DIAGNOSIS — F17200 Nicotine dependence, unspecified, uncomplicated: Secondary | ICD-10-CM | POA: Diagnosis present

## 2021-02-22 DIAGNOSIS — K5730 Diverticulosis of large intestine without perforation or abscess without bleeding: Secondary | ICD-10-CM | POA: Diagnosis present

## 2021-02-22 DIAGNOSIS — R509 Fever, unspecified: Secondary | ICD-10-CM | POA: Diagnosis present

## 2021-02-22 DIAGNOSIS — E876 Hypokalemia: Secondary | ICD-10-CM | POA: Diagnosis present

## 2021-02-22 DIAGNOSIS — R3129 Other microscopic hematuria: Secondary | ICD-10-CM | POA: Diagnosis present

## 2021-02-22 DIAGNOSIS — Z8505 Personal history of malignant neoplasm of liver: Secondary | ICD-10-CM | POA: Diagnosis not present

## 2021-02-22 DIAGNOSIS — K7031 Alcoholic cirrhosis of liver with ascites: Secondary | ICD-10-CM

## 2021-02-22 DIAGNOSIS — N179 Acute kidney failure, unspecified: Secondary | ICD-10-CM | POA: Diagnosis present

## 2021-02-22 DIAGNOSIS — N1832 Chronic kidney disease, stage 3b: Secondary | ICD-10-CM | POA: Diagnosis present

## 2021-02-22 DIAGNOSIS — Z20822 Contact with and (suspected) exposure to covid-19: Secondary | ICD-10-CM | POA: Diagnosis present

## 2021-02-22 DIAGNOSIS — D6489 Other specified anemias: Secondary | ICD-10-CM | POA: Diagnosis present

## 2021-02-22 DIAGNOSIS — K863 Pseudocyst of pancreas: Secondary | ICD-10-CM | POA: Diagnosis present

## 2021-02-22 DIAGNOSIS — Z7682 Awaiting organ transplant status: Secondary | ICD-10-CM | POA: Diagnosis not present

## 2021-02-22 DIAGNOSIS — K8500 Idiopathic acute pancreatitis without necrosis or infection: Secondary | ICD-10-CM | POA: Diagnosis present

## 2021-02-22 DIAGNOSIS — E871 Hypo-osmolality and hyponatremia: Secondary | ICD-10-CM | POA: Diagnosis present

## 2021-02-22 DIAGNOSIS — K5900 Constipation, unspecified: Secondary | ICD-10-CM | POA: Diagnosis present

## 2021-02-22 DIAGNOSIS — D6959 Other secondary thrombocytopenia: Secondary | ICD-10-CM | POA: Diagnosis not present

## 2021-02-22 LAB — URINALYSIS, ROUTINE W REFLEX MICROSCOPIC
Bacteria, UA: NONE SEEN
Bilirubin Urine: NEGATIVE
Glucose, UA: NEGATIVE mg/dL
Ketones, ur: NEGATIVE mg/dL
Leukocytes,Ua: NEGATIVE
Nitrite: NEGATIVE
Protein, ur: 30 mg/dL — AB
Specific Gravity, Urine: 1.041 — ABNORMAL HIGH (ref 1.005–1.030)
pH: 6 (ref 5.0–8.0)

## 2021-02-22 LAB — CBC WITH DIFFERENTIAL/PLATELET
Abs Immature Granulocytes: 0.11 10*3/uL — ABNORMAL HIGH (ref 0.00–0.07)
Basophils Absolute: 0.1 10*3/uL (ref 0.0–0.1)
Basophils Relative: 0 %
Eosinophils Absolute: 0.2 10*3/uL (ref 0.0–0.5)
Eosinophils Relative: 2 %
HCT: 42.6 % (ref 36.0–46.0)
Hemoglobin: 14.2 g/dL (ref 12.0–15.0)
Immature Granulocytes: 1 %
Lymphocytes Relative: 22 %
Lymphs Abs: 3 10*3/uL (ref 0.7–4.0)
MCH: 32.9 pg (ref 26.0–34.0)
MCHC: 33.3 g/dL (ref 30.0–36.0)
MCV: 98.8 fL (ref 80.0–100.0)
Monocytes Absolute: 1.8 10*3/uL — ABNORMAL HIGH (ref 0.1–1.0)
Monocytes Relative: 13 %
Neutro Abs: 8.6 10*3/uL — ABNORMAL HIGH (ref 1.7–7.7)
Neutrophils Relative %: 62 %
Platelets: 82 10*3/uL — ABNORMAL LOW (ref 150–400)
RBC: 4.31 MIL/uL (ref 3.87–5.11)
RDW: 15.1 % (ref 11.5–15.5)
WBC: 13.8 10*3/uL — ABNORMAL HIGH (ref 4.0–10.5)
nRBC: 0.1 % (ref 0.0–0.2)

## 2021-02-22 LAB — COMPREHENSIVE METABOLIC PANEL
ALT: 49 U/L — ABNORMAL HIGH (ref 0–44)
AST: 78 U/L — ABNORMAL HIGH (ref 15–41)
Albumin: 2.8 g/dL — ABNORMAL LOW (ref 3.5–5.0)
Alkaline Phosphatase: 64 U/L (ref 38–126)
Anion gap: 13 (ref 5–15)
BUN: 23 mg/dL (ref 8–23)
CO2: 21 mmol/L — ABNORMAL LOW (ref 22–32)
Calcium: 6.6 mg/dL — ABNORMAL LOW (ref 8.9–10.3)
Chloride: 106 mmol/L (ref 98–111)
Creatinine, Ser: 1.34 mg/dL — ABNORMAL HIGH (ref 0.44–1.00)
GFR, Estimated: 44 mL/min — ABNORMAL LOW (ref >60)
Glucose, Bld: 101 mg/dL — ABNORMAL HIGH (ref 70–99)
Potassium: 3.2 mmol/L — ABNORMAL LOW (ref 3.5–5.1)
Sodium: 140 mmol/L (ref 135–145)
Total Bilirubin: 2.7 mg/dL — ABNORMAL HIGH (ref 0.3–1.2)
Total Protein: 6.6 g/dL (ref 6.5–8.1)

## 2021-02-22 LAB — RESP PANEL BY RT-PCR (FLU A&B, COVID) ARPGX2
Influenza A by PCR: NEGATIVE
Influenza B by PCR: NEGATIVE
SARS Coronavirus 2 by RT PCR: NEGATIVE

## 2021-02-22 LAB — MAGNESIUM
Magnesium: 1.7 mg/dL (ref 1.7–2.4)
Magnesium: 1.7 mg/dL (ref 1.7–2.4)

## 2021-02-22 LAB — RAPID URINE DRUG SCREEN, HOSP PERFORMED
Amphetamines: NOT DETECTED
Barbiturates: NOT DETECTED
Benzodiazepines: NOT DETECTED
Cocaine: NOT DETECTED
Opiates: POSITIVE — AB
Tetrahydrocannabinol: NOT DETECTED

## 2021-02-22 LAB — TRIGLYCERIDES: Triglycerides: 104 mg/dL (ref <150)

## 2021-02-22 LAB — ETHANOL: Alcohol, Ethyl (B): <10 mg/dL (ref <10)

## 2021-02-22 LAB — PROTIME-INR
INR: 1.7 — ABNORMAL HIGH (ref 0.8–1.2)
Prothrombin Time: 19.8 seconds — ABNORMAL HIGH (ref 11.4–15.2)

## 2021-02-22 LAB — LACTIC ACID, PLASMA: Lactic Acid, Venous: 1.8 mmol/L (ref 0.5–1.9)

## 2021-02-22 LAB — GAMMA GT: GGT: 90 U/L — ABNORMAL HIGH (ref 7–50)

## 2021-02-22 LAB — HIV ANTIBODY (ROUTINE TESTING W REFLEX): HIV Screen 4th Generation wRfx: NONREACTIVE

## 2021-02-22 LAB — BILIRUBIN, DIRECT: Bilirubin, Direct: 1.4 mg/dL — ABNORMAL HIGH (ref 0.0–0.2)

## 2021-02-22 MED ORDER — PANTOPRAZOLE SODIUM 40 MG IV SOLR
40.0000 mg | Freq: Two times a day (BID) | INTRAVENOUS | Status: DC
  Administered 2021-02-22 – 2021-02-25 (×8): 40 mg via INTRAVENOUS
  Filled 2021-02-22 (×9): qty 40

## 2021-02-22 MED ORDER — NALOXONE HCL 0.4 MG/ML IJ SOLN: 0.4000 mg | INTRAMUSCULAR | Status: DC | PRN

## 2021-02-22 MED ORDER — SODIUM CHLORIDE 0.9 % IV SOLN: INTRAVENOUS | Status: DC

## 2021-02-22 MED ORDER — ACETAMINOPHEN 650 MG RE SUPP: 650.0000 mg | Freq: Four times a day (QID) | RECTAL | Status: DC | PRN

## 2021-02-22 MED ORDER — POTASSIUM CHLORIDE 10 MEQ/100ML IV SOLN
10.0000 meq | INTRAVENOUS | Status: AC
  Administered 2021-02-22 (×3): 10 meq via INTRAVENOUS
  Filled 2021-02-22 (×3): qty 100

## 2021-02-22 MED ORDER — METOPROLOL SUCCINATE ER 25 MG PO TB24
25.0000 mg | ORAL_TABLET | Freq: Every day | ORAL | Status: DC
  Administered 2021-02-22 – 2021-03-06 (×13): 25 mg via ORAL
  Filled 2021-02-22 (×13): qty 1

## 2021-02-22 MED ORDER — SODIUM CHLORIDE 0.9 % IV BOLUS
250.0000 mL | Freq: Once | INTRAVENOUS | Status: AC
  Administered 2021-02-22: 250 mL via INTRAVENOUS

## 2021-02-22 MED ORDER — ACETAMINOPHEN 325 MG PO TABS: 650.0000 mg | ORAL_TABLET | Freq: Four times a day (QID) | ORAL | Status: DC | PRN

## 2021-02-22 MED ORDER — POTASSIUM CHLORIDE IN NACL 40-0.9 MEQ/L-% IV SOLN
Freq: Once | INTRAVENOUS | Status: AC
  Filled 2021-02-22: qty 1000

## 2021-02-22 MED ORDER — ONDANSETRON HCL 4 MG/2ML IJ SOLN
4.0000 mg | Freq: Four times a day (QID) | INTRAMUSCULAR | Status: DC | PRN
  Administered 2021-02-22 – 2021-02-25 (×2): 4 mg via INTRAVENOUS
  Filled 2021-02-22 (×2): qty 2

## 2021-02-22 MED ORDER — IOHEXOL 350 MG/ML SOLN
60.0000 mL | Freq: Once | INTRAVENOUS | Status: AC | PRN
  Administered 2021-02-22: 60 mL via INTRAVENOUS

## 2021-02-22 MED ORDER — MORPHINE SULFATE (PF) 4 MG/ML IV SOLN
4.0000 mg | INTRAVENOUS | Status: DC | PRN
  Administered 2021-02-22 – 2021-02-23 (×5): 4 mg via INTRAVENOUS
  Filled 2021-02-22 (×5): qty 1

## 2021-02-22 MED ORDER — NICOTINE 14 MG/24HR TD PT24: 14.0000 mg | MEDICATED_PATCH | Freq: Every day | TRANSDERMAL | Status: DC | PRN

## 2021-02-22 NOTE — ED Notes (Signed)
US at bedside

## 2021-02-22 NOTE — ED Notes (Signed)
Pt transported to CT ?

## 2021-02-22 NOTE — H&P (Signed)
History and Physical    PLEASE NOTE THAT DRAGON DICTATION SOFTWARE WAS USED IN THE CONSTRUCTION OF THIS NOTE.   Connie [name] UGQ:[id number] DOB: Feb 03, 1956 DOA: 02/21/2021  PCP: Arthur Holms, NP Patient coming from: home   I have personally briefly reviewed patient's old medical records in [place]  Chief Complaint: Abdominal pain  HPI: Connie [name] is a 65 y.o. female with medical history significant for alcoholic cirrhosis, stage IIIb chronic kidney disease with baseline creatinine range 1.4-1.5, chronic tobacco abuse, who is admitted to [facility] on 02/21/2021 with acute pancreatitis after presenting from home to [facility] ED complaining of abdominal pain.  The patient reports 3 to 4 days of progressive sharp abdominal discomfort over the epigastrium with radiation into the bilateral upper abdominal quadrants.  She notes that this pain has been constant over that timeframe, and notes exacerbation with palpation over the abdomen as well as with attempting to eat or drink over that time.  Consequently, she reports extremely limited oral intake of either food or fluid over that timeframe, tickly over the last 48 hours, significant decrease in her urinary but over the timeframe.  Denies any associated dysuria, or gross hematuria.  She notes intermittent nausea, in the absence of any vomiting.  Not associate with any diarrhea, melena, or hematochezia.  She denies any associated subjective fever, chills, rigors, or generalized myalgias.  No rash.  No recent trauma or travel.  No recent antibiotic use, denies any history of underlying diabetes.  No recent abdominal surgery or ERCP.  She has a history of alcoholic cirrhosis, for which she is reportedly on the transplant list.  Denies any recent alcohol consumption.  She has a documented history of gastritis, reporting good compliance on home Protonix twice daily.  No recent chest pain, shortness of breath, diaphoresis, dizziness,  palpitations.     ED Course:  Vital signs in the ED were notable for the following: Temperature max 100.6, heart rate initially 111, which decreased to 75 following interval administration of IV fluids, as further detailed below; blood pressure 118/67 -141/80; respiratory rate 16-18, oxygen saturation 93 to 99% on room air.  Labs were notable for the following: CMP notable for the following: Potassium 3.2, bicarbonate 24, creatinine 1.51 relative to most recent prior value of 1.43 in April 2021.  Presenting liver enzymes in comparison to most recent prior corresponding set which were drawn on 08/04/2019, were notable for the following: Albumin 3.4, presenting alkaline phosphatase 89 compared to 95 on 08/04/2019, AST 102 compared to 158, ALT 67 compared to 86, and total bilirubin 3.3 compared to 2.7 on 08/04/2019.  Lipase 213, without any prior lipase data point available for point comparison.  Lactic acid 1.8.  INR 1.7.  CBC notable for white blood cell count of 15,400, hemoglobin 16.6, platelet count 102.  Urinalysis showed no white blood cells, no bacteria, leukocyte Estrace negative, nitrate negative, specific gravity 1.041, and showed the presence of hyaline casts.  Screening COVID-19/influenza PCR were checked in the ED today, with results currently pending  Imaging and additional notable ED work-up: Right upper quadrant ultrasound showed gallbladder sludge without evidence of overt stones, and showed no evidence of gallbladder wall thickening, pericholecystic fluid, or choledocholithiasis, and no evidence of common bile duct dilation.  Abdominal ultrasound showed small ascites.  CT abdomen/pelvis with contrast showed inflammatory changes of the pancreas consistent with acute pancreatitis without evidence of necrosis, abscess, or pseudocyst.  CT abdomen/pelvis showed no evidence of gallbladder wall thickening, pericholecystic  fluid, choledocholithiasis, common bile duct dilation, will showing cirrhosis with  small ascites.  CT abdomen/pelvis also notable for showing evidence of colonic diverticulosis without evidence of diverticulitis and no evidence of bowel wall thickening, obstruction, abscess, or perforation.  While in the ED, the following were administered: Morphine, Zofran, normal saline x250 bolus x2 administrations.  Socially, the patient was admitted to the med telemetry unit for further evaluation and management of presenting acute pancreatitis.     Review of Systems: As per HPI otherwise 10 point review of systems negative.   Past Medical History:  Diagnosis Date   Alcohol abuse    Helicobacter pylori gastritis 08/07/2019   Hepatitis C antibody positive in blood - negative RNA    Hypertension     Past Surgical History:  Procedure Laterality Date   BIOPSY  08/03/2019   Procedure: BIOPSY;  Surgeon: Gatha Mayer, MD;  Location: [facility] ENDOSCOPY;  Service: Endoscopy;;   ESOPHAGOGASTRODUODENOSCOPY (EGD) WITH PROPOFOL N/A 08/03/2019   Procedure: ESOPHAGOGASTRODUODENOSCOPY (EGD) WITH PROPOFOL;  Surgeon: Gatha Mayer, MD;  Location: [facility] ENDOSCOPY;  Service: Endoscopy;  Laterality: N/A;    Social History:  reports that she has been smoking. She does not have any smokeless tobacco history on file. She reports current alcohol use. No history on file for drug use.   No Known Allergies  Family History  Problem Relation Age of Onset   Lung cancer Mother     Family history reviewed and not pertinent    Prior to Admission medications   Medication Sig Start Date End Date Taking? Authorizing Provider  metoprolol succinate (TOPROL-XL) 25 MG 24 hr tablet Take 1 tablet (25 mg total) by mouth daily. 08/04/19  Yes Earlene Plater, MD  pantoprazole (PROTONIX) 40 MG tablet Take 1 tablet (40 mg total) by mouth 2 (two) times daily. 08/04/19 02/21/21 Yes Earlene Plater, MD  omeprazole (PRILOSEC) 20 MG capsule Take 1 capsule (20 mg total) by mouth 2 (two) times daily for 14 days. 08/07/19 08/21/19   Gatha Mayer, MD     Objective    Physical Exam: Vitals:   02/22/21 0130 02/22/21 0215 02/22/21 0250 02/22/21 0315  BP: 118/67 (!) 118/53  (!) 142/78  Pulse: 75 78  86  Resp:  16    Temp:   (S) (!) 100.6 F (38.1 C)   TempSrc:   (S) Rectal   SpO2:  91%  92%  Weight:      Height:        General: appears to be stated age; alert, oriented Skin: warm, dry, no rash Head:  AT/[place] Mouth:  Oral mucosa membranes appear dry, normal dentition Neck: supple; trachea midline Heart:  RRR; did not appreciate any M/R/G Lungs: CTAB, did not appreciate any wheezes, rales, or rhonchi Abdomen: + BS; soft, ND, mild tenderness over the epigastrium and bilateral upper abdominal quadrants in the absence of any associated guarding, rigidity, or rebound tenderness. Vascular: 2+ pedal pulses b/l; 2+ radial pulses b/l Extremities: no peripheral edema, no muscle wasting Neuro: strength and sensation intact in upper and lower extremities b/l    Labs on Admission: I have personally reviewed following labs and imaging studies  CBC: Recent Labs  Lab 02/21/21 1538  WBC 15.4*  NEUTROABS 11.0*  HGB 16.6*  HCT 49.0*  MCV 97.6  PLT 161*   Basic Metabolic Panel: Recent Labs  Lab 02/21/21 1538  NA 136  K 3.2*  CL 101  CO2 24  GLUCOSE 115*  BUN 21  CREATININE 1.51*  CALCIUM 7.4*  MG 1.7   GFR: Estimated Creatinine Clearance: 46.4 mL/min (A) (by C-G formula based on SCr of 1.51 mg/dL (H)). Liver Function Tests: Recent Labs  Lab 02/21/21 1538  AST 102*  ALT 67*  ALKPHOS 89  BILITOT 3.3*  PROT 7.5  ALBUMIN 3.4*   Recent Labs  Lab 02/21/21 1538  LIPASE 213*   No results for input(s): AMMONIA in the last 168 hours. Coagulation Profile: No results for input(s): INR, PROTIME in the last 168 hours. Cardiac Enzymes: No results for input(s): CKTOTAL, CKMB, CKMBINDEX, TROPONINI in the last 168 hours. BNP (last 3 results) No results for input(s): PROBNP in the last 8760  hours. HbA1C: No results for input(s): HGBA1C in the last 72 hours. CBG: No results for input(s): GLUCAP in the last 168 hours. Lipid Profile: No results for input(s): CHOL, HDL, LDLCALC, TRIG, CHOLHDL, LDLDIRECT in the last 72 hours. Thyroid Function Tests: No results for input(s): TSH, T4TOTAL, FREET4, T3FREE, THYROIDAB in the last 72 hours. Anemia Panel: No results for input(s): VITAMINB12, FOLATE, FERRITIN, TIBC, IRON, RETICCTPCT in the last 72 hours. Urine analysis:    Component Value Date/Time   COLORURINE YELLOW 08/04/2019 0437   APPEARANCEUR CLEAR 08/04/2019 0437   LABSPEC 1.013 08/04/2019 0437   PHURINE 7.0 08/04/2019 0437   GLUCOSEU NEGATIVE 08/04/2019 0437   HGBUR SMALL (A) 08/04/2019 [place] NEGATIVE 08/04/2019 0437   KETONESUR NEGATIVE 08/04/2019 0437   PROTEINUR NEGATIVE 08/04/2019 0437   NITRITE NEGATIVE 08/04/2019 0437   LEUKOCYTESUR NEGATIVE 08/04/2019 0437    Radiological Exams on Admission: US Abdomen Complete  Result Date: 02/22/2021 CLINICAL DATA:  Right-sided abdominal pain. EXAM: ABDOMEN ULTRASOUND COMPLETE COMPARISON:  Radiology FINDINGS: Evaluation is limited due to body habitus and overlying bowel gas and suboptimal visualization of the structures. Gallbladder: Nonshadowing echogenic debris within the gallbladder, likely sludge. Stones are less likely. No gallbladder wall thickening or pericholecystic fluid. Negative sonographic Murphy's sign. Common bile duct: Diameter: 4 mm Liver: The liver is heterogeneous with irregular contour in keeping with cirrhosis. Portal vein is patent on color Doppler imaging with normal direction of blood flow towards the liver. IVC: No abnormality visualized. Pancreas: Poorly visualized. Spleen: Size and appearance within normal limits. Right Kidney: Length: 10.5 cm. Normal echogenicity. No hydronephrosis or shadowing stone. Left Kidney: Length: 9.7 cm. Normal echogenicity. No hydronephrosis or shadowing stone.  Abdominal aorta: No aneurysm visualized. Other findings: Small ascites. IMPRESSION: 1. Cirrhosis and small ascites. 2. Gallbladder sludge. No sonographic evidence of acute cholecystitis. Electronically Signed   By: Anner Crete M.D.   On: 02/22/2021 01:36   CT ABDOMEN PELVIS W CONTRAST  Result Date: 02/22/2021 CLINICAL DATA:  Abdominal pain.  Concern for diverticulitis. EXAM: CT ABDOMEN AND PELVIS WITH CONTRAST TECHNIQUE: Multidetector CT imaging of the abdomen and pelvis was performed using the standard protocol following bolus administration of intravenous contrast. CONTRAST:  53mL OMNIPAQUE IOHEXOL 350 MG/ML SOLN COMPARISON:  Abdominal ultrasound dated 02/22/2021. FINDINGS: Lower chest: The visualized lung bases are clear. No intra-abdominal free air.  Small ascites. Hepatobiliary: Cirrhosis. No intrahepatic biliary dilatation. The gallbladder is unremarkable. Pancreas: Inflammatory changes of the pancreas consistent with acute pancreatitis. No drainable fluid collection, abscess or pseudocyst. Spleen: Normal in size without focal abnormality. Adrenals/Urinary Tract: The adrenal glands are unremarkable. The kidneys, visualized ureters, and urinary bladder appear unremarkable. Stomach/Bowel: There is no bowel obstruction or active inflammation. The appendix is normal. Scattered colonic diverticulosis without active inflammatory changes. Vascular/Lymphatic: Mild aortoiliac  atherosclerotic disease. The IVC is unremarkable. No portal venous gas. There is no adenopathy. Reproductive: Small ill-defined uterine fibroid.  No adnexal masses. Other: Stranding of the mesentery secondary to acute pancreatitis. Musculoskeletal: No acute or significant osseous findings. IMPRESSION: 1. Acute pancreatitis. No abscess or pseudocyst. 2. Cirrhosis with small ascites. 3. Colonic diverticulosis. No bowel obstruction. Normal appendix. 4. Aortic Atherosclerosis (ICD10-I70.0). Electronically Signed   By: Anner Crete M.D.    On: 02/22/2021 02:05      Assessment/Plan    Principal Problem:   Acute pancreatitis Active Problems:   Alcoholic cirrhosis of liver without ascites (HCC)   Dehydration   Fever   Hypokalemia   CKD (chronic kidney disease) stage 3, GFR 30-59 ml/min (HCC)     #) Acute pancreatitis: Diagnosis on the basis of 3 to 4 days of progressive epigastric pain with radiation into the left upper abdominal quadrant associated with nausea, elevated lipase, and CT abdomen/pelvis showing inflammatory changes of the pancreas consistent with acute pancreatitis without evidence of necrosis, abscess, or pseudocyst.  Etiology leading to her presenting acute pancreatitis is not entirely clear at this time.  The patient has a documented history of alcoholic cirrhosis, but conveys that she has not consumed any recent alcohol serum ethanol level to further evaluate.  Additionally, no evidence of acute biliary findings acute biliary pancreatitis.  Differential includes the possibility of a complication of peptic ulcer disease given a documented history of erosive gastritis, nor felt that this is less likely.  No evidence of hypercalcemia.  Will check triglyceride level.  No overt evidence of pharmacologic contributions.  While the patient does have a mildly elevated temperature, she does not appear overtly infected at this time, in the absence of any evidence of necrosis, pseudocyst, or abscess, including no evidence of acute cholecystitis or ascending cholangitis.  We will further evaluate for other sources of underlying infection, including pursuit of chest x-ray as well as following for result of COVID-19/influenza PCR, but at this time, in the absence of an underlying infectious source, and with mildly elevated temperature possible on the basis of inflammatory response due to the inflammatory factors relating to acute pancreatitis, will refrain from initiation of antibiotics at this time.  History of alcoholic cirrhosis  and presence of mild ascites, being much more conservative with rate administrated IV fluids, as quantified below   Plan: NPO.  Normal saline at 50 cc/h.  As needed IV morphine.  As needed IV Zofran. Recheck CMP and serum magnesium levels tomorrow morning, with electrolyte supplementation as needed.  Check direct bilirubin level.  Check triglyceride levels.  Add on serum ethanol level and UDS.      #) Dehydration: Clinical evidence of such including physical exam evidence of dry oral mucous membranes as well as suspected hemoconcentration results on CBC in addition to urinalysis showing evidence of elevated specific gravity as well as the presence of hyaline casts.  This is in the setting of recent decline in oral intake in the setting of presenting acute pancreatitis.  Not associated with any acute exacerbation of her stage IIIb chronic kidney disease, no evidence of associated hypotension.  Further evidence for mild dehydration include the presence of initial tachycardia which is improved following administration of gentle IV fluids.  Plan: Gentle continuous normal saline, as above.  Monitor strict I's and O's and daily weights.  Repeat CMP in the morning.      #) Fever: Very mild elevation in temperature at 100.6.  No overt evidence of  underlying infectious source at this time, including no evidence of pancreatic necrosis, abscess, or pseudocyst, while no evidence of acute gallbladder pathology including no evidence of acute cholecystitis or ascending cholangitis including the imaging review as well as non overtly cholestatic pattern, although we will continue to closely monitor ensuing liver enzymes.  While the patient does have ascites, it appears to be very small amount, and no evidence of exquisite focal tenderness on physical exam the abdomen to suggest underlying SBP.  Urinalysis not consistent with UTI.  Will check chest x-ray as well as follow-up for results of COVID-19/influenza PCR to  further evaluate for any additional source of underlying infection.  However, in the absence of identification of infectious source, it is possible that the patient's mildly elevated temperature this inflammatory in etiology as a consequence of the inflammatory factors relating to her presenting acute pancreatitis.  Plan: Follow-up results of COVID-19/influenza PCR.  Check chest x-ray.  Repeat CMP in the morning, with direct bilirubin.  Repeat CBC with differential in the morning.     #) Hypokalemia: Presenting serum potassium 3.2.  This is in the setting of significant decline in oral intake over the last few days, as above.  Plan: The patient is in the process of receiving 3 mEq of IV potassium chloride x1 dose.  We will repeat CMP in the morning.  Add on serum magnesium level.  Monitor on telemetry.      #) Alcoholic cirrhosis: Documented history of such, the patient reported no recent consumption of alcohol.  She is also reportedly on the transplant list.  Presenting meld score found to be 22.  Presenting liver enzymes as quantified above, including trend from most recent prior set performed in April 2021.  INR elevated at 1.7.  Plan: Repeat CMP in the morning.  Check INR.  Monitor strict I's and O's and daily weights.     #) Stage IIIb chronic kidney disease: Documented history of such, baseline creatinine range noted to be 1.4-1.5, with presenting serum creatinine found to be consistent with that range.  Plan: Monitor strict I's and O's Daily weights.  Tempt avoid nephrotoxic agents.  In the setting of mild intravascular depletion, will provide gentle IV fluids in the form of normal saline at 50 cc/h, as above.  Repeat CMP in the morning.     #) Chronic tobacco abuse: The patient acknowledges that she is a current smoker  Plan: Counseled the patient importance of complete smoking discontinuation..  Nicotine patch has been ordered for use during this  hospitalization.      DVT prophylaxis: scd's   Code Status: Full code Disposition Plan: Per Rounding Team Consults called: none;  Admission status: Inpatient; med telemetry     Of note, this patient was added by me to the following Admit List/Treatment Team: [email or web address].    Of note, the Adult Admission Order Set (Multimorbid order set) was used by me in the admission process for this patient.   PLEASE NOTE THAT DRAGON DICTATION SOFTWARE WAS USED IN THE CONSTRUCTION OF THIS NOTE.   Rhetta Mura DO [facility] Pager [phone] From [place]   02/22/2021, 3:47 AM

## 2021-02-22 NOTE — ED Notes (Signed)
Pt attached to cardiac monitor x3. VSS. NAD. A&O x4.

## 2021-02-22 NOTE — ED Provider Notes (Signed)
[name] DEPT Provider Note   CSN: [id number] Arrival date & time: 02/21/21  1452     History Chief Complaint  Patient presents with   Flank Pain    Connie [name] is a 65 y.o. female resents to the emergency department with right-sided, severe and progressively worsening abdominal pain.  Records reviewed.  Patient has a history of alcoholic cirrhosis and liver cancer.  She has never had ascites before.  Is on the list for possible liver transplant.  Reports Friday afternoon she developed some abdominal pain that has been progressively worse over the last few days.  She has had nausea and inability to eat.  Patient report eating or drinking anything seems to make symptoms somewhat worse.  She reports due to this and severe pain she has not been taking any oral intake for several days.  Reports no urination in greater than 24 hours.  No treatments prior to arrival.  Patient and family member report tactile temperatures at home but they do not currently have a thermometer.  Patient does report significant constipation without bowel movement in 3 days.  She has used numerous suppositories without relief.  Has never had a paracentesis.  The history is provided by the patient and a significant other. No language interpreter was used.      Past Medical History:  Diagnosis Date   Alcohol abuse    Helicobacter pylori gastritis 08/07/2019   Hepatitis C antibody positive in blood - negative RNA    Hypertension     Patient Active Problem List   Diagnosis Date Noted   Helicobacter pylori gastritis 08/07/2019   Abnormal liver diagnostic imaging    Erosive esophagitis    Erosive gastritis    Atrial fibrillation with RVR ([place]) 08/02/2019   Hematemesis with nausea    Alcoholic cirrhosis of liver without ascites ([place])    Hepatitis C antibody positive in blood - negative RNA     Past Surgical History:  Procedure Laterality Date   BIOPSY  08/03/2019   Procedure:  BIOPSY;  Surgeon: Gatha Mayer, MD;  Location: [facility] ENDOSCOPY;  Service: Endoscopy;;   ESOPHAGOGASTRODUODENOSCOPY (EGD) WITH PROPOFOL N/A 08/03/2019   Procedure: ESOPHAGOGASTRODUODENOSCOPY (EGD) WITH PROPOFOL;  Surgeon: Gatha Mayer, MD;  Location: [facility] ENDOSCOPY;  Service: Endoscopy;  Laterality: N/A;     OB History   No obstetric history on file.     Family History  Problem Relation Age of Onset   Lung cancer Mother     Social History   Tobacco Use   Smoking status: Every Day  Substance Use Topics   Alcohol use: Yes    Home Medications Prior to Admission medications   Medication Sig Start Date End Date Taking? Authorizing Provider  metoprolol succinate (TOPROL-XL) 25 MG 24 hr tablet Take 1 tablet (25 mg total) by mouth daily. 08/04/19  Yes Earlene Plater, MD  pantoprazole (PROTONIX) 40 MG tablet Take 1 tablet (40 mg total) by mouth 2 (two) times daily. 08/04/19 02/21/21 Yes Earlene Plater, MD  omeprazole (PRILOSEC) 20 MG capsule Take 1 capsule (20 mg total) by mouth 2 (two) times daily for 14 days. 08/07/19 08/21/19  Gatha Mayer, MD    Allergies    Patient has no known allergies.  Review of Systems   Review of Systems  Constitutional:  Positive for appetite change, fatigue and fever (tactile). Negative for diaphoresis and unexpected weight change.  HENT:  Negative for mouth sores.   Eyes:  Negative for visual  disturbance.  Respiratory:  Negative for cough, chest tightness, shortness of breath and wheezing.   Cardiovascular:  Positive for leg swelling. Negative for chest pain.  Gastrointestinal:  Positive for abdominal distention, abdominal pain, constipation and nausea. Negative for blood in stool, diarrhea and vomiting.  Endocrine: Negative for polydipsia, polyphagia and polyuria.  Genitourinary:  Positive for decreased urine volume and flank pain. Negative for dysuria, frequency, hematuria and urgency.  Musculoskeletal:  Negative for back pain and neck stiffness.   Skin:  Negative for rash.  Allergic/Immunologic: Negative for immunocompromised state.  Neurological:  Negative for syncope, light-headedness and headaches.  Hematological:  Bruises/bleeds easily.  Psychiatric/Behavioral:  Negative for sleep disturbance. The patient is not nervous/anxious.    Physical Exam Updated Vital Signs BP 124/63   Pulse 82   Temp 98.2 F (36.8 C) (Oral)   Resp 18   Ht 5\' 7"  (1.702 m)   Wt 105.3 kg   SpO2 93%   BMI 36.36 kg/m   Physical Exam Vitals and nursing note reviewed.  Constitutional:      General: She is not in acute distress.    Appearance: She is not diaphoretic.  HENT:     Head: Normocephalic.     Mouth/Throat:     Mouth: Mucous membranes are dry.  Eyes:     General: No scleral icterus.    Conjunctiva/sclera: Conjunctivae normal.  Cardiovascular:     Rate and Rhythm: Regular rhythm. Tachycardia present.     Pulses: Normal pulses.          Radial pulses are 2+ on the right side and 2+ on the left side.  Pulmonary:     Effort: Pulmonary effort is normal. No tachypnea, accessory muscle usage, prolonged expiration, respiratory distress or retractions.     Breath sounds: Normal breath sounds. No stridor.     Comments: Equal chest rise. No increased work of breathing. Abdominal:     General: There is no distension.     Palpations: Abdomen is soft.     Tenderness: There is abdominal tenderness in the right upper quadrant, right lower quadrant, periumbilical area and suprapubic area. There is guarding. There is no right CVA tenderness, left CVA tenderness or rebound.     Comments: Difficult exam as patient is unable to lie on her back due to severe pain   Musculoskeletal:     Cervical back: Normal range of motion.     Comments: Moves all extremities equally and without difficulty.  Skin:    General: Skin is warm and dry.     Capillary Refill: Capillary refill takes less than 2 seconds.  Neurological:     Mental Status: She is alert.      GCS: GCS eye subscore is 4. GCS verbal subscore is 5. GCS motor subscore is 6.     Comments: Speech is clear and goal oriented.  Psychiatric:        Mood and Affect: Mood normal.    ED Results / Procedures / Treatments   Labs (all labs ordered are listed, but only abnormal results are displayed) Labs Reviewed  CBC WITH DIFFERENTIAL/PLATELET - Abnormal; Notable for the following components:      Result Value   WBC 15.4 (*)    Hemoglobin 16.6 (*)    HCT 49.0 (*)    Platelets 102 (*)    Neutro Abs 11.0 (*)    Monocytes Absolute 1.1 (*)    Abs Immature Granulocytes 0.11 (*)    All other components  within normal limits  COMPREHENSIVE METABOLIC PANEL - Abnormal; Notable for the following components:   Potassium 3.2 (*)    Glucose, Bld 115 (*)    Creatinine, Ser 1.51 (*)    Calcium 7.4 (*)    Albumin 3.4 (*)    AST 102 (*)    ALT 67 (*)    Total Bilirubin 3.3 (*)    GFR, Estimated 38 (*)    All other components within normal limits  LIPASE, BLOOD - Abnormal; Notable for the following components:   Lipase 213 (*)    All other components within normal limits  LACTIC ACID, PLASMA  URINALYSIS, ROUTINE W REFLEX MICROSCOPIC  LACTIC ACID, PLASMA    EKG None  Radiology US Abdomen Complete  Result Date: 02/22/2021 CLINICAL DATA:  Right-sided abdominal pain. EXAM: ABDOMEN ULTRASOUND COMPLETE COMPARISON:  Radiology FINDINGS: Evaluation is limited due to body habitus and overlying bowel gas and suboptimal visualization of the structures. Gallbladder: Nonshadowing echogenic debris within the gallbladder, likely sludge. Stones are less likely. No gallbladder wall thickening or pericholecystic fluid. Negative sonographic Murphy's sign. Common bile duct: Diameter: 4 mm Liver: The liver is heterogeneous with irregular contour in keeping with cirrhosis. Portal vein is patent on color Doppler imaging with normal direction of blood flow towards the liver. IVC: No abnormality visualized. Pancreas:  Poorly visualized. Spleen: Size and appearance within normal limits. Right Kidney: Length: 10.5 cm. Normal echogenicity. No hydronephrosis or shadowing stone. Left Kidney: Length: 9.7 cm. Normal echogenicity. No hydronephrosis or shadowing stone. Abdominal aorta: No aneurysm visualized. Other findings: Small ascites. IMPRESSION: 1. Cirrhosis and small ascites. 2. Gallbladder sludge. No sonographic evidence of acute cholecystitis. Electronically Signed   By: Anner Crete M.D.   On: 02/22/2021 01:36   CT ABDOMEN PELVIS W CONTRAST  Result Date: 02/22/2021 CLINICAL DATA:  Abdominal pain.  Concern for diverticulitis. EXAM: CT ABDOMEN AND PELVIS WITH CONTRAST TECHNIQUE: Multidetector CT imaging of the abdomen and pelvis was performed using the standard protocol following bolus administration of intravenous contrast. CONTRAST:  84mL OMNIPAQUE IOHEXOL 350 MG/ML SOLN COMPARISON:  Abdominal ultrasound dated 02/22/2021. FINDINGS: Lower chest: The visualized lung bases are clear. No intra-abdominal free air.  Small ascites. Hepatobiliary: Cirrhosis. No intrahepatic biliary dilatation. The gallbladder is unremarkable. Pancreas: Inflammatory changes of the pancreas consistent with acute pancreatitis. No drainable fluid collection, abscess or pseudocyst. Spleen: Normal in size without focal abnormality. Adrenals/Urinary Tract: The adrenal glands are unremarkable. The kidneys, visualized ureters, and urinary bladder appear unremarkable. Stomach/Bowel: There is no bowel obstruction or active inflammation. The appendix is normal. Scattered colonic diverticulosis without active inflammatory changes. Vascular/Lymphatic: Mild aortoiliac atherosclerotic disease. The IVC is unremarkable. No portal venous gas. There is no adenopathy. Reproductive: Small ill-defined uterine fibroid.  No adnexal masses. Other: Stranding of the mesentery secondary to acute pancreatitis. Musculoskeletal: No acute or significant osseous findings.  IMPRESSION: 1. Acute pancreatitis. No abscess or pseudocyst. 2. Cirrhosis with small ascites. 3. Colonic diverticulosis. No bowel obstruction. Normal appendix. 4. Aortic Atherosclerosis (ICD10-I70.0). Electronically Signed   By: Anner Crete M.D.   On: 02/22/2021 02:05    Procedures Procedures   Medications Ordered in ED Medications  sodium chloride 0.9 % bolus 250 mL (has no administration in time range)  morphine 4 MG/ML injection 4 mg (4 mg Intravenous Given 02/22/21 0005)  ondansetron (ZOFRAN) injection 4 mg (4 mg Intravenous Given 02/22/21 0010)  sodium chloride 0.9 % bolus 250 mL (0 mLs Intravenous Stopped 02/22/21 0139)  iohexol (OMNIPAQUE) 350 MG/ML injection  60 mL (60 mLs Intravenous Contrast Given 02/22/21 0146)    ED Course  I have reviewed the triage vital signs and the nursing notes.  Pertinent labs & imaging results that were available during my care of the patient were reviewed by me and considered in my medical decision making (see chart for details).  Clinical Course as of 02/22/21 0329  Mon Feb 21, 2021  2356 Creatinine(!): 1.51 Baseline  [HM]  Tue Feb 22, 2021  0026 Potassium(!): 3.2 mild [HM]    Clinical Course User Index [HM] Andree Heeg, Gwenlyn Perking   MDM Rules/Calculators/A&P                           Patient presents with severe abdominal pain and dehydration.  Dry mucous membranes.  No urination in greater than 24 hours.  This is noted along with mild hypokalemia.  Creatinine is baseline.  Patient with elevated lipase.  No history of pancreatitis.  CT scan does show acute pancreatitis but no necrosis.  Ultrasound with a small amount of ascites.  Of note gallbladder sludge is noted.  Total bili is elevated at 3.3 today up from 2.71-year ago.  AST ALT appear to be baseline or slightly better.  Given patient's inability to tolerate p.o. and severe pain will admit for rehydration, pain control.  Patient has been unable to urinate here in the emergency  department.  Pending UA.  Additional fluid bolus given.  4:00 AM Discussed with [facility] hospitalist who will admit - will hold antibiotics until there is a clearer source of infection.    Final Clinical Impression(s) / ED Diagnoses Final diagnoses:  Acute pancreatitis without infection or necrosis, unspecified pancreatitis type  Alcoholic cirrhosis of liver with ascites ([place])  Dehydration    Rx / DC Orders ED Discharge Orders     None        Taijon Vink, Gwenlyn Perking 29/93/71 [id number]    Delora Fuel, MD 89/38/10 [phone]

## 2021-02-22 NOTE — ED Notes (Signed)
Aware of need for urine sample 

## 2021-02-22 NOTE — Progress Notes (Signed)
  PROGRESS NOTE  Patient admitted earlier this morning. See H&P.   Patient admitted with abdominal pain, was found to have acute pancreatitis.  Patient seen in the emergency department, states that she feels a little bit better since admission to the hospital.  She denies any recent alcohol intake.  She is reportedly on transplant list, has history of alcoholic cirrhosis.  Continue supportive care including n.p.o., pain control, antiemetic, IV fluid.  Replace potassium.  Continue to trend labs.  Triglyceride was within normal limit, abdominal ultrasound without gallbladder disease.  Status is: Inpatient  Remains inpatient appropriate because: N.p.o., IV fluid, IV pain medication       Dessa Phi, DO [facility] 02/22/2021, 12:10 PM  Available via Epic secure chat 7am-7pm After these hours, please refer to coverage provider listed on [email or web address]

## 2021-02-23 ENCOUNTER — Other Ambulatory Visit: Payer: Self-pay

## 2021-02-23 DIAGNOSIS — K7031 Alcoholic cirrhosis of liver with ascites: Secondary | ICD-10-CM | POA: Diagnosis not present

## 2021-02-23 DIAGNOSIS — K859 Acute pancreatitis without necrosis or infection, unspecified: Secondary | ICD-10-CM | POA: Diagnosis not present

## 2021-02-23 DIAGNOSIS — E876 Hypokalemia: Secondary | ICD-10-CM | POA: Diagnosis not present

## 2021-02-23 DIAGNOSIS — N183 Chronic kidney disease, stage 3 unspecified: Secondary | ICD-10-CM | POA: Diagnosis not present

## 2021-02-23 LAB — COMPREHENSIVE METABOLIC PANEL WITH GFR
ALT: 39 U/L (ref 0–44)
AST: 62 U/L — ABNORMAL HIGH (ref 15–41)
Albumin: 2.8 g/dL — ABNORMAL LOW (ref 3.5–5.0)
Alkaline Phosphatase: 62 U/L (ref 38–126)
Anion gap: 8 (ref 5–15)
BUN: 18 mg/dL (ref 8–23)
CO2: 22 mmol/L (ref 22–32)
Calcium: 6.7 mg/dL — ABNORMAL LOW (ref 8.9–10.3)
Chloride: 107 mmol/L (ref 98–111)
Creatinine, Ser: 1.2 mg/dL — ABNORMAL HIGH (ref 0.44–1.00)
GFR, Estimated: 50 mL/min — ABNORMAL LOW (ref >60)
Glucose, Bld: 91 mg/dL (ref 70–99)
Potassium: 3.5 mmol/L (ref 3.5–5.1)
Sodium: 137 mmol/L (ref 135–145)
Total Bilirubin: 2.9 mg/dL — ABNORMAL HIGH (ref 0.3–1.2)
Total Protein: 6.9 g/dL (ref 6.5–8.1)

## 2021-02-23 LAB — CBC
HCT: 41.5 % (ref 36.0–46.0)
Hemoglobin: 13.2 g/dL (ref 12.0–15.0)
MCH: 32.4 pg (ref 26.0–34.0)
MCHC: 31.8 g/dL (ref 30.0–36.0)
MCV: 102 fL — ABNORMAL HIGH (ref 80.0–100.0)
Platelets: 90 K/uL — ABNORMAL LOW (ref 150–400)
RBC: 4.07 MIL/uL (ref 3.87–5.11)
RDW: 15.8 % — ABNORMAL HIGH (ref 11.5–15.5)
WBC: 10.5 K/uL (ref 4.0–10.5)
nRBC: 0.3 % — ABNORMAL HIGH (ref 0.0–0.2)

## 2021-02-23 LAB — MAGNESIUM: Magnesium: 1.8 mg/dL (ref 1.7–2.4)

## 2021-02-23 MED ORDER — ENOXAPARIN SODIUM 40 MG/0.4ML IJ SOSY
40.0000 mg | PREFILLED_SYRINGE | INTRAMUSCULAR | Status: DC
  Administered 2021-02-23 – 2021-03-06 (×12): 40 mg via SUBCUTANEOUS
  Filled 2021-02-23 (×13): qty 0.4

## 2021-02-23 MED ORDER — PANCRELIPASE (LIP-PROT-AMYL) 12000-38000 UNITS PO CPEP
12000.0000 [IU] | ORAL_CAPSULE | Freq: Three times a day (TID) | ORAL | Status: DC
  Administered 2021-02-23 – 2021-03-06 (×30): 12000 [IU] via ORAL
  Filled 2021-02-23 (×32): qty 1

## 2021-02-23 MED ORDER — SODIUM CHLORIDE 0.9 % IV SOLN: INTRAVENOUS | Status: DC

## 2021-02-23 MED ORDER — BACLOFEN 10 MG PO TABS
10.0000 mg | ORAL_TABLET | Freq: Two times a day (BID) | ORAL | Status: DC | PRN
  Administered 2021-02-23 – 2021-02-25 (×2): 10 mg via ORAL
  Filled 2021-02-23 (×2): qty 1

## 2021-02-23 MED ORDER — HYDROCODONE-ACETAMINOPHEN 5-325 MG PO TABS
1.0000 | ORAL_TABLET | ORAL | Status: DC | PRN
  Administered 2021-02-23: 2 via ORAL
  Administered 2021-02-23: 1 via ORAL
  Administered 2021-02-24 – 2021-02-28 (×12): 2 via ORAL
  Administered 2021-02-28 (×2): 1 via ORAL
  Administered 2021-03-01 – 2021-03-03 (×8): 2 via ORAL
  Administered 2021-03-04 – 2021-03-06 (×8): 1 via ORAL
  Filled 2021-02-23 (×7): qty 2
  Filled 2021-02-23: qty 1
  Filled 2021-02-23: qty 2
  Filled 2021-02-23 (×2): qty 1
  Filled 2021-02-23: qty 2
  Filled 2021-02-23 (×2): qty 1
  Filled 2021-02-23: qty 2
  Filled 2021-02-23 (×2): qty 1
  Filled 2021-02-23 (×4): qty 2
  Filled 2021-02-23 (×3): qty 1
  Filled 2021-02-23: qty 2
  Filled 2021-02-23: qty 1
  Filled 2021-02-23 (×4): qty 2
  Filled 2021-02-23: qty 1
  Filled 2021-02-23 (×3): qty 2

## 2021-02-23 MED ORDER — POTASSIUM CHLORIDE IN NACL 40-0.9 MEQ/L-% IV SOLN
INTRAVENOUS | Status: AC
  Filled 2021-02-23: qty 1000

## 2021-02-23 NOTE — ED Notes (Signed)
Pt helped to bedside commode

## 2021-02-23 NOTE — Progress Notes (Signed)
PROGRESS NOTE  Connie [name]    DOB: [birth year], 65 y.o.  QPR:[id number]  PCP: Arthur Holms, NP   Code Status: Full Code   DOA: 02/21/2021   LOS: 1  Brief Narrative of Current Hospitalization  Connie [name] is a 65 y.o. female with a PMH significant for alcoholic cirrhosis, stage III CKD, tobacco use. They presented from home to the ED on 02/21/2021 with epigastric pain, anorexia, nausea/vomiting x 4 days. In the ED, it was found that they had acute pancreatitis. They were treated with IV fluids and analgesics treatment.  Patient was admitted to medicine service for further workup and management of acute pancreatitis as outlined in detail below.  02/23/21 -improved  Assessment & Plan  Principal Problem:   Acute pancreatitis Active Problems:   Alcoholic cirrhosis of liver without ascites (HCC)   Dehydration   Fever   Hypokalemia   CKD (chronic kidney disease) stage 3, GFR 30-59 ml/min (HCC)  Idiopathic Acute pancreatitis without signs of infection or necrosis-triglycerides 104, no signs of bile duct obstruction, blood alcohol negative on admission (although does have history of heavy alcohol use), no trauma,  -Analgesia as needed -Zofran as needed -Continue IV maintenance fluids -Advance diet as tolerated -Creon  Alcoholic liver cirrhosis with ascites  H/o hepatitis C with treatment history unknown-AST 62, ALT 39, platelets 90, INR 1.7 on admission -Continue to monitor -Follow-up outpatient with GI  Hypokalemia-resolved.  K+ 3.5 today -Continue potassium supplement and maintenance IV fluids -BMP a.m.  CKD stage III-appears to be at baseline today -Avoid nephrotoxic agents  Tobacco use-  -Cessation counseling -Nicotine patch  PAF-rate controlled.  Home meds do not include anticoagulation.  Will discuss further with patient in a.m. -Continue home metoprolol  DVT prophylaxis: enoxaparin (LOVENOX) injection 40 mg Start: 02/23/21 1000   Diet:  Diet Orders (From  admission, onward)     Start     Ordered   02/23/21 0840  Diet clear liquid Room service appropriate? Yes; Fluid consistency: Thin  Diet effective now       Question Answer Comment  Room service appropriate? Yes   Fluid consistency: Thin      02/23/21 0839            Subjective 02/23/21    Pt reports improvement in pain and nausea  Disposition Plan & Communication  Patient status: Inpatient  Admitted From: Home Disposition: Home Anticipated discharge date: 10/27 if tolerating diet  Family Communication: None Consults, Procedures, Significant Events  Consultants:  None  Procedures/significant events:  None  Objective   Vitals:   02/23/21 0600 02/23/21 0700 02/23/21 1000 02/23/21 1004  BP: 132/70 137/75 (!) 141/75 (!) 141/75  Pulse: 71 78 79 82  Resp: 13 11 16    Temp:      TempSrc:      SpO2: 98% 93% 93%   Weight:      Height:        Intake/Output Summary (Last 24 hours) at 02/23/2021 1153 Last data filed at 02/23/2021 0236 Gross per 24 hour  Intake 1722.32 ml  Output --  Net 1722.32 ml   Filed Weights   02/21/21 1922  Weight: 105.3 kg    Patient BMI: Body mass index is 36.36 kg/m.   Physical Exam: General: awake, alert, NAD HEENT: atraumatic, clear conjunctiva, anicteric sclera, moist mucus membranes, hearing grossly normal Respiratory: normal respiratory effort. Cardiovascular: normal S1/S2,  RRR, no JVD, murmurs, rubs, gallops, quick capillary refill  Gastrointestinal: soft, tenderness diffusely, especially periumbilical.  No  rebound or guarding Nervous: A&O x3. no gross focal neurologic deficits, normal speech Extremities: moves all equally, trace edema, normal tone Skin: dry, intact, normal temperature, normal color, No rashes, lesions or ulcers Psychiatry: normal mood, congruent affect  Labs   I have personally reviewed following labs and imaging studies Admission on 02/21/2021  Component Date Value Ref Range Status   WBC 02/21/2021 15.4  (A)  4.0 - 10.5 K/uL Final   RBC 02/21/2021 5.02  3.87 - 5.11 MIL/uL Final   Hemoglobin 02/21/2021 16.6 (A)  12.0 - 15.0 g/dL Final   HCT 02/21/2021 49.0 (A)  36.0 - 46.0 % Final   MCV 02/21/2021 97.6  80.0 - 100.0 fL Final   MCH 02/21/2021 33.1  26.0 - 34.0 pg Final   MCHC 02/21/2021 33.9  30.0 - 36.0 g/dL Final   RDW 02/21/2021 15.2  11.5 - 15.5 % Final   Platelets 02/21/2021 102 (A)  150 - 400 K/uL Final   nRBC 02/21/2021 0.2  0.0 - 0.2 % Final   Neutrophils Relative % 02/21/2021 70  % Final   Neutro Abs 02/21/2021 11.0 (A)  1.7 - 7.7 K/uL Final   Lymphocytes Relative 02/21/2021 20  % Final   Lymphs Abs 02/21/2021 3.0  0.7 - 4.0 K/uL Final   Monocytes Relative 02/21/2021 7  % Final   Monocytes Absolute 02/21/2021 1.1 (A)  0.1 - 1.0 K/uL Final   Eosinophils Relative 02/21/2021 1  % Final   Eosinophils Absolute 02/21/2021 0.2  0.0 - 0.5 K/uL Final   Basophils Relative 02/21/2021 1  % Final   Basophils Absolute 02/21/2021 0.1  0.0 - 0.1 K/uL Final   Immature Granulocytes 02/21/2021 1  % Final   Abs Immature Granulocytes 02/21/2021 0.11 (A)  0.00 - 0.07 K/uL Final   Sodium 02/21/2021 136  135 - 145 mmol/L Final   Potassium 02/21/2021 3.2 (A)  3.5 - 5.1 mmol/L Final   Chloride 02/21/2021 101  98 - 111 mmol/L Final   CO2 02/21/2021 24  22 - 32 mmol/L Final   Glucose, Bld 02/21/2021 115 (A)  70 - 99 mg/dL Final   BUN 02/21/2021 21  8 - 23 mg/dL Final   Creatinine, Ser 02/21/2021 1.51 (A)  0.44 - 1.00 mg/dL Final   Calcium 02/21/2021 7.4 (A)  8.9 - 10.3 mg/dL Final   Total Protein 02/21/2021 7.5  6.5 - 8.1 g/dL Final   Albumin 02/21/2021 3.4 (A)  3.5 - 5.0 g/dL Final   AST 02/21/2021 102 (A)  15 - 41 U/L Final   ALT 02/21/2021 67 (A)  0 - 44 U/L Final   Alkaline Phosphatase 02/21/2021 89  38 - 126 U/L Final   Total Bilirubin 02/21/2021 3.3 (A)  0.3 - 1.2 mg/dL Final   GFR, Estimated 02/21/2021 38 (A)  >60 mL/min Final   Anion gap 02/21/2021 11  5 - 15 Final   Lipase 02/21/2021 213  (A)  11 - 51 U/L Final   Color, Urine 02/22/2021 AMBER (A)  YELLOW Final   APPearance 02/22/2021 CLEAR  CLEAR Final   Specific Gravity, Urine 02/22/2021 1.041 (A)  1.005 - 1.030 Final   pH 02/22/2021 6.0  5.0 - 8.0 Final   Glucose, UA 02/22/2021 NEGATIVE  NEGATIVE mg/dL Final   Hgb urine dipstick 02/22/2021 SMALL (A)  NEGATIVE Final   Bilirubin Urine 02/22/2021 NEGATIVE  NEGATIVE Final   Ketones, ur 02/22/2021 NEGATIVE  NEGATIVE mg/dL Final   Protein, ur 02/22/2021 30 (A)  NEGATIVE mg/dL  Final   Nitrite 02/22/2021 NEGATIVE  NEGATIVE Final   Leukocytes,Ua 02/22/2021 NEGATIVE  NEGATIVE Final   RBC / HPF 02/22/2021 0-5  0 - 5 RBC/hpf Final   WBC, UA 02/22/2021 0-5  0 - 5 WBC/hpf Final   Bacteria, UA 02/22/2021 NONE SEEN  NONE SEEN Final   Squamous Epithelial / LPF 02/22/2021 0-5  0 - 5 Final   Hyaline Casts, UA 02/22/2021 PRESENT   Final   Lactic Acid, Venous 02/22/2021 1.8  0.5 - 1.9 mmol/L Final   Magnesium 02/21/2021 1.7  1.7 - 2.4 mg/dL Final   SARS Coronavirus 2 by RT PCR 02/22/2021 NEGATIVE  NEGATIVE Final   Influenza A by PCR 02/22/2021 NEGATIVE  NEGATIVE Final   Influenza B by PCR 02/22/2021 NEGATIVE  NEGATIVE Final   HIV Screen 4th Generation wRfx 02/22/2021 Non Reactive  Non Reactive Final   Magnesium 02/22/2021 1.7  1.7 - 2.4 mg/dL Final   Sodium 02/22/2021 140  135 - 145 mmol/L Final   Potassium 02/22/2021 3.2 (A)  3.5 - 5.1 mmol/L Final   Chloride 02/22/2021 106  98 - 111 mmol/L Final   CO2 02/22/2021 21 (A)  22 - 32 mmol/L Final   Glucose, Bld 02/22/2021 101 (A)  70 - 99 mg/dL Final   BUN 02/22/2021 23  8 - 23 mg/dL Final   Creatinine, Ser 02/22/2021 1.34 (A)  0.44 - 1.00 mg/dL Final   Calcium 02/22/2021 6.6 (A)  8.9 - 10.3 mg/dL Final   Total Protein 02/22/2021 6.6  6.5 - 8.1 g/dL Final   Albumin 02/22/2021 2.8 (A)  3.5 - 5.0 g/dL Final   AST 02/22/2021 78 (A)  15 - 41 U/L Final   ALT 02/22/2021 49 (A)  0 - 44 U/L Final   Alkaline Phosphatase 02/22/2021 64  38 - 126  U/L Final   Total Bilirubin 02/22/2021 2.7 (A)  0.3 - 1.2 mg/dL Final   GFR, Estimated 02/22/2021 44 (A)  >60 mL/min Final   Anion gap 02/22/2021 13  5 - 15 Final   WBC 02/22/2021 13.8 (A)  4.0 - 10.5 K/uL Final   RBC 02/22/2021 4.31  3.87 - 5.11 MIL/uL Final   Hemoglobin 02/22/2021 14.2  12.0 - 15.0 g/dL Final   HCT 02/22/2021 42.6  36.0 - 46.0 % Final   MCV 02/22/2021 98.8  80.0 - 100.0 fL Final   MCH 02/22/2021 32.9  26.0 - 34.0 pg Final   MCHC 02/22/2021 33.3  30.0 - 36.0 g/dL Final   RDW 02/22/2021 15.1  11.5 - 15.5 % Final   Platelets 02/22/2021 82 (A)  150 - 400 K/uL Final   nRBC 02/22/2021 0.1  0.0 - 0.2 % Final   Neutrophils Relative % 02/22/2021 62  % Final   Neutro Abs 02/22/2021 8.6 (A)  1.7 - 7.7 K/uL Final   Lymphocytes Relative 02/22/2021 22  % Final   Lymphs Abs 02/22/2021 3.0  0.7 - 4.0 K/uL Final   Monocytes Relative 02/22/2021 13  % Final   Monocytes Absolute 02/22/2021 1.8 (A)  0.1 - 1.0 K/uL Final   Eosinophils Relative 02/22/2021 2  % Final   Eosinophils Absolute 02/22/2021 0.2  0.0 - 0.5 K/uL Final   Basophils Relative 02/22/2021 0  % Final   Basophils Absolute 02/22/2021 0.1  0.0 - 0.1 K/uL Final   Immature Granulocytes 02/22/2021 1  % Final   Abs Immature Granulocytes 02/22/2021 0.11 (A)  0.00 - 0.07 K/uL Final   Prothrombin Time 02/22/2021 19.8 (  A)  11.4 - 15.2 seconds Final   INR 02/22/2021 1.7 (A)  0.8 - 1.2 Final   Bilirubin, Direct 02/22/2021 1.4 (A)  0.0 - 0.2 mg/dL Final   GGT 02/22/2021 90 (A)  7 - 50 U/L Final   Alcohol, Ethyl (B) 02/22/2021 <10  <10 mg/dL Final   Opiates 02/22/2021 POSITIVE (A)  NONE DETECTED Final   Cocaine 02/22/2021 NONE DETECTED  NONE DETECTED Final   Benzodiazepines 02/22/2021 NONE DETECTED  NONE DETECTED Final   Amphetamines 02/22/2021 NONE DETECTED  NONE DETECTED Final   Tetrahydrocannabinol 02/22/2021 NONE DETECTED  NONE DETECTED Final   Barbiturates 02/22/2021 NONE DETECTED  NONE DETECTED Final   Triglycerides  02/22/2021 104  <150 mg/dL Final   WBC 02/23/2021 10.5  4.0 - 10.5 K/uL Final   RBC 02/23/2021 4.07  3.87 - 5.11 MIL/uL Final   Hemoglobin 02/23/2021 13.2  12.0 - 15.0 g/dL Final   HCT 02/23/2021 41.5  36.0 - 46.0 % Final   MCV 02/23/2021 102.0 (A)  80.0 - 100.0 fL Final   MCH 02/23/2021 32.4  26.0 - 34.0 pg Final   MCHC 02/23/2021 31.8  30.0 - 36.0 g/dL Final   RDW 02/23/2021 15.8 (A)  11.5 - 15.5 % Final   Platelets 02/23/2021 90 (A)  150 - 400 K/uL Final   nRBC 02/23/2021 0.3 (A)  0.0 - 0.2 % Final   Sodium 02/23/2021 137  135 - 145 mmol/L Final   Potassium 02/23/2021 3.5  3.5 - 5.1 mmol/L Final   Chloride 02/23/2021 107  98 - 111 mmol/L Final   CO2 02/23/2021 22  22 - 32 mmol/L Final   Glucose, Bld 02/23/2021 91  70 - 99 mg/dL Final   BUN 02/23/2021 18  8 - 23 mg/dL Final   Creatinine, Ser 02/23/2021 1.20 (A)  0.44 - 1.00 mg/dL Final   Calcium 02/23/2021 6.7 (A)  8.9 - 10.3 mg/dL Final   Total Protein 02/23/2021 6.9  6.5 - 8.1 g/dL Final   Albumin 02/23/2021 2.8 (A)  3.5 - 5.0 g/dL Final   AST 02/23/2021 62 (A)  15 - 41 U/L Final   ALT 02/23/2021 39  0 - 44 U/L Final   Alkaline Phosphatase 02/23/2021 62  38 - 126 U/L Final   Total Bilirubin 02/23/2021 2.9 (A)  0.3 - 1.2 mg/dL Final   GFR, Estimated 02/23/2021 50 (A)  >60 mL/min Final   Anion gap 02/23/2021 8  5 - 15 Final   Magnesium 02/23/2021 1.8  1.7 - 2.4 mg/dL Final    Imaging Studies  No results found. Medications   Scheduled Meds:  enoxaparin (LOVENOX) injection  40 mg Subcutaneous Q24H   lipase/protease/amylase  12,000 Units Oral TID WC   metoprolol succinate  25 mg Oral Daily   pantoprazole (PROTONIX) IV  40 mg Intravenous Q12H   No recently discontinued medications to reconcile  LOS: 1 day   Time spent: >27min  Nijae Doyel L Camerin Jimenez, DO [facility] 02/23/2021, 11:53 AM   Please refer to amion to contact the [facility] Attending or Consulting provider for this pt  [email or web address] Available by Epic secure  chat 7AM-7PM. If 7PM-7AM, please contact night-coverage

## 2021-02-24 DIAGNOSIS — E860 Dehydration: Secondary | ICD-10-CM | POA: Diagnosis not present

## 2021-02-24 DIAGNOSIS — K859 Acute pancreatitis without necrosis or infection, unspecified: Secondary | ICD-10-CM | POA: Diagnosis not present

## 2021-02-24 DIAGNOSIS — K5900 Constipation, unspecified: Secondary | ICD-10-CM | POA: Diagnosis present

## 2021-02-24 DIAGNOSIS — K7031 Alcoholic cirrhosis of liver with ascites: Secondary | ICD-10-CM | POA: Diagnosis not present

## 2021-02-24 DIAGNOSIS — N183 Chronic kidney disease, stage 3 unspecified: Secondary | ICD-10-CM | POA: Diagnosis not present

## 2021-02-24 LAB — BASIC METABOLIC PANEL
Anion gap: 6 (ref 5–15)
BUN: 12 mg/dL (ref 8–23)
CO2: 22 mmol/L (ref 22–32)
Calcium: 7.6 mg/dL — ABNORMAL LOW (ref 8.9–10.3)
Chloride: 109 mmol/L (ref 98–111)
Creatinine, Ser: 1.18 mg/dL — ABNORMAL HIGH (ref 0.44–1.00)
GFR, Estimated: 51 mL/min — ABNORMAL LOW (ref >60)
Glucose, Bld: 135 mg/dL — ABNORMAL HIGH (ref 70–99)
Potassium: 3.5 mmol/L (ref 3.5–5.1)
Sodium: 137 mmol/L (ref 135–145)

## 2021-02-24 LAB — URINALYSIS, ROUTINE W REFLEX MICROSCOPIC
Bilirubin Urine: NEGATIVE
Glucose, UA: NEGATIVE mg/dL
Ketones, ur: NEGATIVE mg/dL
Leukocytes,Ua: NEGATIVE
Nitrite: NEGATIVE
Protein, ur: 30 mg/dL — AB
Specific Gravity, Urine: 1.02 (ref 1.005–1.030)
pH: 5 (ref 5.0–8.0)

## 2021-02-24 MED ORDER — SORBITOL 70 % SOLN
960.0000 mL | TOPICAL_OIL | Freq: Once | ORAL | Status: AC
  Administered 2021-02-25: 960 mL via RECTAL
  Filled 2021-02-24 (×2): qty 473

## 2021-02-24 MED ORDER — SENNA 8.6 MG PO TABS
1.0000 | ORAL_TABLET | Freq: Every day | ORAL | Status: DC
  Administered 2021-02-24 – 2021-03-05 (×8): 8.6 mg via ORAL
  Filled 2021-02-24 (×9): qty 1

## 2021-02-24 MED ORDER — POLYETHYLENE GLYCOL 3350 17 G PO PACK
17.0000 g | PACK | Freq: Two times a day (BID) | ORAL | Status: DC
  Administered 2021-02-24 – 2021-03-06 (×12): 17 g via ORAL
  Filled 2021-02-24 (×16): qty 1

## 2021-02-24 MED ORDER — SORBITOL 70 % SOLN
960.0000 mL | TOPICAL_OIL | Freq: Once | ORAL | Status: DC
  Filled 2021-02-24: qty 473

## 2021-02-24 NOTE — Plan of Care (Signed)
  Problem: Health Behavior/Discharge Planning: Goal: Ability to manage health-related needs will improve Outcome: Progressing   Problem: Clinical Measurements: Goal: Ability to maintain clinical measurements within normal limits will improve Outcome: Progressing Goal: Will remain free from infection Outcome: Progressing Goal: Diagnostic test results will improve Outcome: Progressing Goal: Respiratory complications will improve Outcome: Progressing Goal: Cardiovascular complication will be avoided Outcome: Progressing   Problem: Activity: Goal: Risk for activity intolerance will decrease Outcome: Progressing   Problem: Elimination: Goal: Will not experience complications related to bowel motility Outcome: Progressing Goal: Will not experience complications related to urinary retention Outcome: Progressing   Problem: Safety: Goal: Ability to remain free from injury will improve Outcome: Progressing   Problem: Skin Integrity: Goal: Risk for impaired skin integrity will decrease Outcome: Progressing

## 2021-02-24 NOTE — Progress Notes (Signed)
The patient is injury-free, afebrile, alert, and oriented X 4. Vital signs were within the baseline during this shift. Pt's pain is controlled with current pain regiment. She denies chest pain, SOB, nausea, vomiting, dizziness, signs or symptoms of bleeding, or acute changes during this shift. We will continue to monitor and work toward achieving the care plan goals.

## 2021-02-24 NOTE — Progress Notes (Signed)
PROGRESS NOTE  Connie [name]    DOB: [birth year], 65 y.o.  QQV:[id number]  PCP: Arthur Holms, NP   Code Status: Full Code   DOA: 02/21/2021   LOS: 2  Brief Narrative of Current Hospitalization  Connie [name] is a 65 y.o. female with a PMH significant for alcoholic cirrhosis, stage III CKD, tobacco use. They presented from home to the ED on 02/21/2021 with epigastric pain, anorexia, nausea/vomiting x 4 days. In the ED, it was found that they had acute pancreatitis. They were treated with IV fluids and analgesics treatment.  Patient was admitted to medicine service for further workup and management of acute pancreatitis as outlined in detail below.  02/24/21 -improved  Assessment & Plan  Principal Problem:   Acute pancreatitis Active Problems:   Alcoholic cirrhosis of liver with ascites (HCC)   Dehydration   Fever   Hypokalemia   CKD (chronic kidney disease) stage 3, GFR 30-59 ml/min (HCC)  Idiopathic Acute pancreatitis without signs of infection or necrosis-triglycerides 104, no signs of bile duct obstruction, blood alcohol negative on admission (although does have history of heavy alcohol use), no trauma.  -Analgesia as needed -Zofran as needed -Continue IV maintenance fluids -Advance diet as tolerated -Creon  Acute constipation- patient denies BM since last Friday. Suspect that this discomfort is compounding or main contributor to her current abdominal pain as it is more diffusely located than typical pancreatic pain. She typically has daily Bms at baseline.  - miralax BID - senna daily - SMOG enema - strict I/O  Dysuria- patient complaining of abnormal color/smell of urine x2 days with dysuria. Again, could be contributing to current symptoms.  - urinalysis with reflex microscopy  Alcoholic liver cirrhosis with ascites  H/o hepatitis C with treatment history unknown-AST 62, ALT 39, platelets 90, INR 1.7 on admission -Continue to monitor -Follow-up outpatient with  GI  Hypokalemia-resolved.  K+ 3.5 today. Hopefully will improve with increased PO intake -BMP a.m.  CKD stage III-appears to be at baseline today -Avoid nephrotoxic agents  Tobacco use-  -Cessation counseling -Nicotine patch  PAF-rate controlled.  Home meds do not include anticoagulation.  Will discuss further with patient in a.m. -Continue home metoprolol  DVT prophylaxis: enoxaparin (LOVENOX) injection 40 mg Start: 02/23/21 1000   Diet:  Diet Orders (From admission, onward)     Start     Ordered   02/23/21 0840  Diet clear liquid Room service appropriate? Yes; Fluid consistency: Thin  Diet effective now       Question Answer Comment  Room service appropriate? Yes   Fluid consistency: Thin      02/23/21 0839            Subjective 02/24/21    Pt reports feeling fine when laying flat and not moving but has diffuse abdominal discomfort when ambulating/moving. Denies BM for 7-8 days.  Disposition Plan & Communication  Patient status: Inpatient  Admitted From: Home Disposition: Home Anticipated discharge date: 10/27 if tolerating diet  Family Communication: None Consults, Procedures, Significant Events  Consultants:  None  Procedures/significant events:  None  Objective   Vitals:   02/23/21 1335 02/23/21 1436 02/23/21 2114 02/24/21 0446  BP: (!) 143/70  107/62 111/63  Pulse: 83  82 71  Resp: (!) 22  18 19   Temp: 100 F (37.8 C) 99.9 F (37.7 C) 99.9 F (37.7 C) 98.8 F (37.1 C)  TempSrc: Oral Axillary    SpO2: 96%  97% 96%  Weight:    108.7 kg  Height:        Intake/Output Summary (Last 24 hours) at 02/24/2021 1133 Last data filed at 02/23/2021 1832 Gross per 24 hour  Intake 1276.17 ml  Output 230 ml  Net 1046.17 ml    Filed Weights   02/21/21 1922 02/24/21 0446  Weight: 105.3 kg 108.7 kg    Patient BMI: Body mass index is 37.53 kg/m.   Physical Exam: General: awake, alert, NAD HEENT: atraumatic, clear conjunctiva, anicteric sclera,  moist mucus membranes, hearing grossly normal Respiratory: normal respiratory effort. Cardiovascular: normal S1/S2,  RRR, no JVD, murmurs, rubs, gallops, quick capillary refill  Gastrointestinal: soft, tenderness diffusely, especially periumbilical.  No rebound or guarding Nervous: A&O x3. no gross focal neurologic deficits, normal speech Extremities: moves all equally, trace edema, normal tone Skin: dry, intact, normal temperature, normal color, No rashes, lesions or ulcers Psychiatry: normal mood, congruent affect  Labs   I have personally reviewed following labs and imaging studies Admission on 02/21/2021  Component Date Value Ref Range Status   WBC 02/21/2021 15.4 (A)  4.0 - 10.5 K/uL Final   RBC 02/21/2021 5.02  3.87 - 5.11 MIL/uL Final   Hemoglobin 02/21/2021 16.6 (A)  12.0 - 15.0 g/dL Final   HCT 02/21/2021 49.0 (A)  36.0 - 46.0 % Final   MCV 02/21/2021 97.6  80.0 - 100.0 fL Final   MCH 02/21/2021 33.1  26.0 - 34.0 pg Final   MCHC 02/21/2021 33.9  30.0 - 36.0 g/dL Final   RDW 02/21/2021 15.2  11.5 - 15.5 % Final   Platelets 02/21/2021 102 (A)  150 - 400 K/uL Final   nRBC 02/21/2021 0.2  0.0 - 0.2 % Final   Neutrophils Relative % 02/21/2021 70  % Final   Neutro Abs 02/21/2021 11.0 (A)  1.7 - 7.7 K/uL Final   Lymphocytes Relative 02/21/2021 20  % Final   Lymphs Abs 02/21/2021 3.0  0.7 - 4.0 K/uL Final   Monocytes Relative 02/21/2021 7  % Final   Monocytes Absolute 02/21/2021 1.1 (A)  0.1 - 1.0 K/uL Final   Eosinophils Relative 02/21/2021 1  % Final   Eosinophils Absolute 02/21/2021 0.2  0.0 - 0.5 K/uL Final   Basophils Relative 02/21/2021 1  % Final   Basophils Absolute 02/21/2021 0.1  0.0 - 0.1 K/uL Final   Immature Granulocytes 02/21/2021 1  % Final   Abs Immature Granulocytes 02/21/2021 0.11 (A)  0.00 - 0.07 K/uL Final   Sodium 02/21/2021 136  135 - 145 mmol/L Final   Potassium 02/21/2021 3.2 (A)  3.5 - 5.1 mmol/L Final   Chloride 02/21/2021 101  98 - 111 mmol/L Final    CO2 02/21/2021 24  22 - 32 mmol/L Final   Glucose, Bld 02/21/2021 115 (A)  70 - 99 mg/dL Final   BUN 02/21/2021 21  8 - 23 mg/dL Final   Creatinine, Ser 02/21/2021 1.51 (A)  0.44 - 1.00 mg/dL Final   Calcium 02/21/2021 7.4 (A)  8.9 - 10.3 mg/dL Final   Total Protein 02/21/2021 7.5  6.5 - 8.1 g/dL Final   Albumin 02/21/2021 3.4 (A)  3.5 - 5.0 g/dL Final   AST 02/21/2021 102 (A)  15 - 41 U/L Final   ALT 02/21/2021 67 (A)  0 - 44 U/L Final   Alkaline Phosphatase 02/21/2021 89  38 - 126 U/L Final   Total Bilirubin 02/21/2021 3.3 (A)  0.3 - 1.2 mg/dL Final   GFR, Estimated 02/21/2021 38 (A)  >60 mL/min Final  Anion gap 02/21/2021 11  5 - 15 Final   Lipase 02/21/2021 213 (A)  11 - 51 U/L Final   Color, Urine 02/22/2021 AMBER (A)  YELLOW Final   APPearance 02/22/2021 CLEAR  CLEAR Final   Specific Gravity, Urine 02/22/2021 1.041 (A)  1.005 - 1.030 Final   pH 02/22/2021 6.0  5.0 - 8.0 Final   Glucose, UA 02/22/2021 NEGATIVE  NEGATIVE mg/dL Final   Hgb urine dipstick 02/22/2021 SMALL (A)  NEGATIVE Final   Bilirubin Urine 02/22/2021 NEGATIVE  NEGATIVE Final   Ketones, ur 02/22/2021 NEGATIVE  NEGATIVE mg/dL Final   Protein, ur 02/22/2021 30 (A)  NEGATIVE mg/dL Final   Nitrite 02/22/2021 NEGATIVE  NEGATIVE Final   Leukocytes,Ua 02/22/2021 NEGATIVE  NEGATIVE Final   RBC / HPF 02/22/2021 0-5  0 - 5 RBC/hpf Final   WBC, UA 02/22/2021 0-5  0 - 5 WBC/hpf Final   Bacteria, UA 02/22/2021 NONE SEEN  NONE SEEN Final   Squamous Epithelial / LPF 02/22/2021 0-5  0 - 5 Final   Hyaline Casts, UA 02/22/2021 PRESENT   Final   Lactic Acid, Venous 02/22/2021 1.8  0.5 - 1.9 mmol/L Final   Magnesium 02/21/2021 1.7  1.7 - 2.4 mg/dL Final   SARS Coronavirus 2 by RT PCR 02/22/2021 NEGATIVE  NEGATIVE Final   Influenza A by PCR 02/22/2021 NEGATIVE  NEGATIVE Final   Influenza B by PCR 02/22/2021 NEGATIVE  NEGATIVE Final   HIV Screen 4th Generation wRfx 02/22/2021 Non Reactive  Non Reactive Final   Magnesium 02/22/2021  1.7  1.7 - 2.4 mg/dL Final   Sodium 02/22/2021 140  135 - 145 mmol/L Final   Potassium 02/22/2021 3.2 (A)  3.5 - 5.1 mmol/L Final   Chloride 02/22/2021 106  98 - 111 mmol/L Final   CO2 02/22/2021 21 (A)  22 - 32 mmol/L Final   Glucose, Bld 02/22/2021 101 (A)  70 - 99 mg/dL Final   BUN 02/22/2021 23  8 - 23 mg/dL Final   Creatinine, Ser 02/22/2021 1.34 (A)  0.44 - 1.00 mg/dL Final   Calcium 02/22/2021 6.6 (A)  8.9 - 10.3 mg/dL Final   Total Protein 02/22/2021 6.6  6.5 - 8.1 g/dL Final   Albumin 02/22/2021 2.8 (A)  3.5 - 5.0 g/dL Final   AST 02/22/2021 78 (A)  15 - 41 U/L Final   ALT 02/22/2021 49 (A)  0 - 44 U/L Final   Alkaline Phosphatase 02/22/2021 64  38 - 126 U/L Final   Total Bilirubin 02/22/2021 2.7 (A)  0.3 - 1.2 mg/dL Final   GFR, Estimated 02/22/2021 44 (A)  >60 mL/min Final   Anion gap 02/22/2021 13  5 - 15 Final   WBC 02/22/2021 13.8 (A)  4.0 - 10.5 K/uL Final   RBC 02/22/2021 4.31  3.87 - 5.11 MIL/uL Final   Hemoglobin 02/22/2021 14.2  12.0 - 15.0 g/dL Final   HCT 02/22/2021 42.6  36.0 - 46.0 % Final   MCV 02/22/2021 98.8  80.0 - 100.0 fL Final   MCH 02/22/2021 32.9  26.0 - 34.0 pg Final   MCHC 02/22/2021 33.3  30.0 - 36.0 g/dL Final   RDW 02/22/2021 15.1  11.5 - 15.5 % Final   Platelets 02/22/2021 82 (A)  150 - 400 K/uL Final   nRBC 02/22/2021 0.1  0.0 - 0.2 % Final   Neutrophils Relative % 02/22/2021 62  % Final   Neutro Abs 02/22/2021 8.6 (A)  1.7 - 7.7 K/uL Final  Lymphocytes Relative 02/22/2021 22  % Final   Lymphs Abs 02/22/2021 3.0  0.7 - 4.0 K/uL Final   Monocytes Relative 02/22/2021 13  % Final   Monocytes Absolute 02/22/2021 1.8 (A)  0.1 - 1.0 K/uL Final   Eosinophils Relative 02/22/2021 2  % Final   Eosinophils Absolute 02/22/2021 0.2  0.0 - 0.5 K/uL Final   Basophils Relative 02/22/2021 0  % Final   Basophils Absolute 02/22/2021 0.1  0.0 - 0.1 K/uL Final   Immature Granulocytes 02/22/2021 1  % Final   Abs Immature Granulocytes 02/22/2021 0.11 (A)  0.00 -  0.07 K/uL Final   Prothrombin Time 02/22/2021 19.8 (A)  11.4 - 15.2 seconds Final   INR 02/22/2021 1.7 (A)  0.8 - 1.2 Final   Bilirubin, Direct 02/22/2021 1.4 (A)  0.0 - 0.2 mg/dL Final   GGT 02/22/2021 90 (A)  7 - 50 U/L Final   Alcohol, Ethyl (B) 02/22/2021 <10  <10 mg/dL Final   Opiates 02/22/2021 POSITIVE (A)  NONE DETECTED Final   Cocaine 02/22/2021 NONE DETECTED  NONE DETECTED Final   Benzodiazepines 02/22/2021 NONE DETECTED  NONE DETECTED Final   Amphetamines 02/22/2021 NONE DETECTED  NONE DETECTED Final   Tetrahydrocannabinol 02/22/2021 NONE DETECTED  NONE DETECTED Final   Barbiturates 02/22/2021 NONE DETECTED  NONE DETECTED Final   Triglycerides 02/22/2021 104  <150 mg/dL Final   WBC 02/23/2021 10.5  4.0 - 10.5 K/uL Final   RBC 02/23/2021 4.07  3.87 - 5.11 MIL/uL Final   Hemoglobin 02/23/2021 13.2  12.0 - 15.0 g/dL Final   HCT 02/23/2021 41.5  36.0 - 46.0 % Final   MCV 02/23/2021 102.0 (A)  80.0 - 100.0 fL Final   MCH 02/23/2021 32.4  26.0 - 34.0 pg Final   MCHC 02/23/2021 31.8  30.0 - 36.0 g/dL Final   RDW 02/23/2021 15.8 (A)  11.5 - 15.5 % Final   Platelets 02/23/2021 90 (A)  150 - 400 K/uL Final   nRBC 02/23/2021 0.3 (A)  0.0 - 0.2 % Final   Sodium 02/23/2021 137  135 - 145 mmol/L Final   Potassium 02/23/2021 3.5  3.5 - 5.1 mmol/L Final   Chloride 02/23/2021 107  98 - 111 mmol/L Final   CO2 02/23/2021 22  22 - 32 mmol/L Final   Glucose, Bld 02/23/2021 91  70 - 99 mg/dL Final   BUN 02/23/2021 18  8 - 23 mg/dL Final   Creatinine, Ser 02/23/2021 1.20 (A)  0.44 - 1.00 mg/dL Final   Calcium 02/23/2021 6.7 (A)  8.9 - 10.3 mg/dL Final   Total Protein 02/23/2021 6.9  6.5 - 8.1 g/dL Final   Albumin 02/23/2021 2.8 (A)  3.5 - 5.0 g/dL Final   AST 02/23/2021 62 (A)  15 - 41 U/L Final   ALT 02/23/2021 39  0 - 44 U/L Final   Alkaline Phosphatase 02/23/2021 62  38 - 126 U/L Final   Total Bilirubin 02/23/2021 2.9 (A)  0.3 - 1.2 mg/dL Final   GFR, Estimated 02/23/2021 50 (A)  >60  mL/min Final   Anion gap 02/23/2021 8  5 - 15 Final   Magnesium 02/23/2021 1.8  1.7 - 2.4 mg/dL Final   Sodium 02/24/2021 137  135 - 145 mmol/L Final   Potassium 02/24/2021 3.5  3.5 - 5.1 mmol/L Final   Chloride 02/24/2021 109  98 - 111 mmol/L Final   CO2 02/24/2021 22  22 - 32 mmol/L Final   Glucose, Bld 02/24/2021 135 (A)  70 - 99 mg/dL Final   BUN 02/24/2021 12  8 - 23 mg/dL Final   Creatinine, Ser 02/24/2021 1.18 (A)  0.44 - 1.00 mg/dL Final   Calcium 02/24/2021 7.6 (A)  8.9 - 10.3 mg/dL Final   GFR, Estimated 02/24/2021 51 (A)  >60 mL/min Final   Anion gap 02/24/2021 6  5 - 15 Final    Imaging Studies  No results found. Medications   Scheduled Meds:  enoxaparin (LOVENOX) injection  40 mg Subcutaneous Q24H   lipase/protease/amylase  12,000 Units Oral TID WC   metoprolol succinate  25 mg Oral Daily   pantoprazole (PROTONIX) IV  40 mg Intravenous Q12H   polyethylene glycol  17 g Oral BID   senna  1 tablet Oral Daily   sorbitol, milk of mag, mineral oil, glycerin (SMOG) enema  960 mL Rectal Once   No recently discontinued medications to reconcile  LOS: 2 days   Time spent: >50min  Alireza Pollack L Tavone Caesar, DO [facility] 02/24/2021, 11:33 AM   Please refer to amion to contact the [facility] Attending or Consulting provider for this pt  [email or web address] Available by Epic secure chat 7AM-7PM. If 7PM-7AM, please contact night-coverage

## 2021-02-25 DIAGNOSIS — N183 Chronic kidney disease, stage 3 unspecified: Secondary | ICD-10-CM | POA: Diagnosis not present

## 2021-02-25 DIAGNOSIS — K859 Acute pancreatitis without necrosis or infection, unspecified: Secondary | ICD-10-CM | POA: Diagnosis not present

## 2021-02-25 DIAGNOSIS — K5900 Constipation, unspecified: Secondary | ICD-10-CM | POA: Diagnosis not present

## 2021-02-25 DIAGNOSIS — E860 Dehydration: Secondary | ICD-10-CM | POA: Diagnosis not present

## 2021-02-25 LAB — BASIC METABOLIC PANEL
Anion gap: 7 (ref 5–15)
BUN: 8 mg/dL (ref 8–23)
CO2: 24 mmol/L (ref 22–32)
Calcium: 8.4 mg/dL — ABNORMAL LOW (ref 8.9–10.3)
Chloride: 103 mmol/L (ref 98–111)
Creatinine, Ser: 1.08 mg/dL — ABNORMAL HIGH (ref 0.44–1.00)
GFR, Estimated: 57 mL/min — ABNORMAL LOW (ref >60)
Glucose, Bld: 112 mg/dL — ABNORMAL HIGH (ref 70–99)
Potassium: 3.8 mmol/L (ref 3.5–5.1)
Sodium: 134 mmol/L — ABNORMAL LOW (ref 135–145)

## 2021-02-25 LAB — CBC
HCT: 34.9 % — ABNORMAL LOW (ref 36.0–46.0)
Hemoglobin: 11.2 g/dL — ABNORMAL LOW (ref 12.0–15.0)
MCH: 33.1 pg (ref 26.0–34.0)
MCHC: 32.1 g/dL (ref 30.0–36.0)
MCV: 103.3 fL — ABNORMAL HIGH (ref 80.0–100.0)
Platelets: 119 10*3/uL — ABNORMAL LOW (ref 150–400)
RBC: 3.38 MIL/uL — ABNORMAL LOW (ref 3.87–5.11)
RDW: 15.4 % (ref 11.5–15.5)
WBC: 9.1 10*3/uL (ref 4.0–10.5)
nRBC: 0.8 % — ABNORMAL HIGH (ref 0.0–0.2)

## 2021-02-25 MED ORDER — DIPHENHYDRAMINE HCL 25 MG PO CAPS: 25.0000 mg | ORAL_CAPSULE | Freq: Once | ORAL | Status: DC

## 2021-02-25 MED ORDER — CEPHALEXIN 500 MG PO CAPS
500.0000 mg | ORAL_CAPSULE | Freq: Three times a day (TID) | ORAL | Status: AC
  Administered 2021-02-25 – 2021-02-28 (×9): 500 mg via ORAL
  Filled 2021-02-25 (×9): qty 1

## 2021-02-25 NOTE — Care Management Important Message (Signed)
Medicare IM printed for W/L Social Work to give to the patient. 

## 2021-02-25 NOTE — Progress Notes (Incomplete)
MD informed RN to administer SMOG enema today.

## 2021-02-25 NOTE — Progress Notes (Signed)
   02/25/21 1246  Mobility  Activity Ambulated in hall;Ambulated to bathroom  Level of Assistance Standby assist, set-up cues, supervision of patient - no hands on  Assistive Device Front wheel walker  Distance Ambulated (ft) 200 ft  Mobility Ambulated with assistance in hallway  Mobility Response Tolerated well  Mobility performed by Mobility specialist  $Mobility charge 1 Mobility   Pt agreeable to mobilize this afternoon. RN in room upon entry, gave pt pain medication prior to session. Pt requested to use the bathroom prior to mobilizing. Ambulated bout 263ft in hall with RW, tolerated well. She stated she was having some pain on her right side, but noted the pain only occurs when she is moving. Pain was stated to be 7-8/10 during ambulation and about 3/10 while standing. Left pt in bed with call bell at side.  [place] Specialist Acute Rehab Services Office: [phone]

## 2021-02-25 NOTE — Plan of Care (Signed)

## 2021-02-25 NOTE — Progress Notes (Signed)
PROGRESS NOTE  Connie [name]    DOB: 02/23/1956, 65 y.o.  GGY:[id number]  PCP: Arthur Holms, NP   Code Status: Full Code   DOA: 02/21/2021   LOS: 3  Brief Narrative of Current Hospitalization  Connie [name] is a 65 y.o. female with a PMH significant for alcoholic cirrhosis, stage III CKD, tobacco use. They presented from home to the ED on 02/21/2021 with epigastric pain, anorexia, nausea/vomiting x 4 days. In the ED, it was found that they had acute pancreatitis. They were treated with IV fluids and analgesics treatment.  Patient was admitted to medicine service for further workup and management of acute pancreatitis as outlined in detail below.  02/25/21 -stable  Assessment & Plan  Principal Problem:   Acute pancreatitis Active Problems:   Alcoholic cirrhosis of liver with ascites (HCC)   Dehydration   Fever   Hypokalemia   CKD (chronic kidney disease) stage 3, GFR 30-59 ml/min (HCC)   Acute constipation  Idiopathic Acute pancreatitis without signs of infection or necrosis-triglycerides 104, no signs of bile duct obstruction, blood alcohol negative on admission (although does have history of heavy alcohol use), no trauma.  -Analgesia as needed -Zofran as needed -Continue IV maintenance fluids -Advance diet as tolerated -Creon  Acute constipation- patient denies BM since last Friday. Suspect that this discomfort is compounding or main contributor to her current abdominal pain as it is more diffusely located than typical pancreatic pain. She typically has daily Bms at baseline. I ordered a SMOG enema yesterday which was never given to the patient. Spoke to her nurse directly today to administer.  - miralax BID - senna daily - SMOG enema - strict I/O  Dysuria- patient complaining of abnormal color/smell of urine x2 days with dysuria. Again, could be contributing to current symptoms. urinalysis with reflex microscopy showing hematuria without overt infection.  - given that  patient continues to have urinary symptoms, will treat with Abx   - keflex (10/28- - follow up urology OP  Alcoholic liver cirrhosis with ascites  H/o hepatitis C with treatment history unknown-AST 62, ALT 39, platelets 90, INR 1.7 on admission -Continue to monitor -Follow-up outpatient with GI  Hypokalemia-resolved.  K+ 3.8 today.  mild hyponatremia- 134 today. Asymptomatic.  -BMP a.m.  CKD stage III-appears to be at baseline today -Avoid nephrotoxic agents  Tobacco use-  -Cessation counseling -Nicotine patch  PAF-rate controlled.  Home meds do not include anticoagulation.  Will discuss further with patient in a.m. - ECG -Continue home metoprolol  DVT prophylaxis: enoxaparin (LOVENOX) injection 40 mg Start: 02/23/21 1000   Diet:  Diet Orders (From admission, onward)     Start     Ordered   02/23/21 0840  Diet clear liquid Room service appropriate? Yes; Fluid consistency: Thin  Diet effective now       Question Answer Comment  Room service appropriate? Yes   Fluid consistency: Thin      02/23/21 0839            Subjective 02/25/21    Pt reports continued discomfort in diffuse abdomen. She states that no one offered her an enema yesterday and she wanted one. Still no BM but had some watery leakage from rectum, she thinks.  Disposition Plan & Communication  Patient status: Inpatient  Admitted From: Home Disposition: Home Anticipated discharge date: 10/29 if tolerating diet and has Bms.  Family Communication: None Consults, Procedures, Significant Events  Consultants:  None  Procedures/significant events:  None  Objective  Vitals:   02/24/21 0446 02/24/21 1254 02/24/21 2016 02/25/21 0302  BP: 111/63 119/77 128/67 118/74  Pulse: 71 76 69 78  Resp: 19 18 16 16   Temp: 98.8 F (37.1 C) 98.6 F (37 C) 98.1 F (36.7 C) 98.3 F (36.8 C)  TempSrc:  Oral Oral Oral  SpO2: 96% 97% 97% 97%  Weight: 108.7 kg     Height:        Intake/Output Summary  (Last 24 hours) at 02/25/2021 1139 Last data filed at 02/25/2021 0800 Gross per 24 hour  Intake 540 ml  Output --  Net 540 ml    Filed Weights   02/21/21 1922 02/24/21 0446  Weight: 105.3 kg 108.7 kg    Patient BMI: Body mass index is 37.53 kg/m.   Physical Exam: General: awake, alert, NAD HEENT: atraumatic, clear conjunctiva, anicteric sclera, moist mucus membranes, hearing grossly normal Respiratory: normal respiratory effort. Cardiovascular: normal S1/S2,  RRR, no JVD, murmurs, rubs, gallops, quick capillary refill  Gastrointestinal: soft, tenderness diffusely, especially periumbilical.  No rebound or guarding Nervous: A&O x3. no gross focal neurologic deficits, normal speech Extremities: moves all equally, trace edema, normal tone Skin: dry, intact, normal temperature, normal color, No rashes, lesions or ulcers Psychiatry: anxious mood, congruent affect  Labs   I have personally reviewed following labs and imaging studies Admission on 02/21/2021  Component Date Value Ref Range Status   WBC 02/21/2021 15.4 (A)  4.0 - 10.5 K/uL Final   RBC 02/21/2021 5.02  3.87 - 5.11 MIL/uL Final   Hemoglobin 02/21/2021 16.6 (A)  12.0 - 15.0 g/dL Final   HCT 02/21/2021 49.0 (A)  36.0 - 46.0 % Final   MCV 02/21/2021 97.6  80.0 - 100.0 fL Final   MCH 02/21/2021 33.1  26.0 - 34.0 pg Final   MCHC 02/21/2021 33.9  30.0 - 36.0 g/dL Final   RDW 02/21/2021 15.2  11.5 - 15.5 % Final   Platelets 02/21/2021 102 (A)  150 - 400 K/uL Final   nRBC 02/21/2021 0.2  0.0 - 0.2 % Final   Neutrophils Relative % 02/21/2021 70  % Final   Neutro Abs 02/21/2021 11.0 (A)  1.7 - 7.7 K/uL Final   Lymphocytes Relative 02/21/2021 20  % Final   Lymphs Abs 02/21/2021 3.0  0.7 - 4.0 K/uL Final   Monocytes Relative 02/21/2021 7  % Final   Monocytes Absolute 02/21/2021 1.1 (A)  0.1 - 1.0 K/uL Final   Eosinophils Relative 02/21/2021 1  % Final   Eosinophils Absolute 02/21/2021 0.2  0.0 - 0.5 K/uL Final   Basophils  Relative 02/21/2021 1  % Final   Basophils Absolute 02/21/2021 0.1  0.0 - 0.1 K/uL Final   Immature Granulocytes 02/21/2021 1  % Final   Abs Immature Granulocytes 02/21/2021 0.11 (A)  0.00 - 0.07 K/uL Final   Sodium 02/21/2021 136  135 - 145 mmol/L Final   Potassium 02/21/2021 3.2 (A)  3.5 - 5.1 mmol/L Final   Chloride 02/21/2021 101  98 - 111 mmol/L Final   CO2 02/21/2021 24  22 - 32 mmol/L Final   Glucose, Bld 02/21/2021 115 (A)  70 - 99 mg/dL Final   BUN 02/21/2021 21  8 - 23 mg/dL Final   Creatinine, Ser 02/21/2021 1.51 (A)  0.44 - 1.00 mg/dL Final   Calcium 02/21/2021 7.4 (A)  8.9 - 10.3 mg/dL Final   Total Protein 02/21/2021 7.5  6.5 - 8.1 g/dL Final   Albumin 02/21/2021 3.4 (A)  3.5 -  5.0 g/dL Final   AST 02/21/2021 102 (A)  15 - 41 U/L Final   ALT 02/21/2021 67 (A)  0 - 44 U/L Final   Alkaline Phosphatase 02/21/2021 89  38 - 126 U/L Final   Total Bilirubin 02/21/2021 3.3 (A)  0.3 - 1.2 mg/dL Final   GFR, Estimated 02/21/2021 38 (A)  >60 mL/min Final   Anion gap 02/21/2021 11  5 - 15 Final   Lipase 02/21/2021 213 (A)  11 - 51 U/L Final   Color, Urine 02/22/2021 AMBER (A)  YELLOW Final   APPearance 02/22/2021 CLEAR  CLEAR Final   Specific Gravity, Urine 02/22/2021 1.041 (A)  1.005 - 1.030 Final   pH 02/22/2021 6.0  5.0 - 8.0 Final   Glucose, UA 02/22/2021 NEGATIVE  NEGATIVE mg/dL Final   Hgb urine dipstick 02/22/2021 SMALL (A)  NEGATIVE Final   Bilirubin Urine 02/22/2021 NEGATIVE  NEGATIVE Final   Ketones, ur 02/22/2021 NEGATIVE  NEGATIVE mg/dL Final   Protein, ur 02/22/2021 30 (A)  NEGATIVE mg/dL Final   Nitrite 02/22/2021 NEGATIVE  NEGATIVE Final   Leukocytes,Ua 02/22/2021 NEGATIVE  NEGATIVE Final   RBC / HPF 02/22/2021 0-5  0 - 5 RBC/hpf Final   WBC, UA 02/22/2021 0-5  0 - 5 WBC/hpf Final   Bacteria, UA 02/22/2021 NONE SEEN  NONE SEEN Final   Squamous Epithelial / LPF 02/22/2021 0-5  0 - 5 Final   Hyaline Casts, UA 02/22/2021 PRESENT   Final   Lactic Acid, Venous  02/22/2021 1.8  0.5 - 1.9 mmol/L Final   Magnesium 02/21/2021 1.7  1.7 - 2.4 mg/dL Final   SARS Coronavirus 2 by RT PCR 02/22/2021 NEGATIVE  NEGATIVE Final   Influenza A by PCR 02/22/2021 NEGATIVE  NEGATIVE Final   Influenza B by PCR 02/22/2021 NEGATIVE  NEGATIVE Final   HIV Screen 4th Generation wRfx 02/22/2021 Non Reactive  Non Reactive Final   Magnesium 02/22/2021 1.7  1.7 - 2.4 mg/dL Final   Sodium 02/22/2021 140  135 - 145 mmol/L Final   Potassium 02/22/2021 3.2 (A)  3.5 - 5.1 mmol/L Final   Chloride 02/22/2021 106  98 - 111 mmol/L Final   CO2 02/22/2021 21 (A)  22 - 32 mmol/L Final   Glucose, Bld 02/22/2021 101 (A)  70 - 99 mg/dL Final   BUN 02/22/2021 23  8 - 23 mg/dL Final   Creatinine, Ser 02/22/2021 1.34 (A)  0.44 - 1.00 mg/dL Final   Calcium 02/22/2021 6.6 (A)  8.9 - 10.3 mg/dL Final   Total Protein 02/22/2021 6.6  6.5 - 8.1 g/dL Final   Albumin 02/22/2021 2.8 (A)  3.5 - 5.0 g/dL Final   AST 02/22/2021 78 (A)  15 - 41 U/L Final   ALT 02/22/2021 49 (A)  0 - 44 U/L Final   Alkaline Phosphatase 02/22/2021 64  38 - 126 U/L Final   Total Bilirubin 02/22/2021 2.7 (A)  0.3 - 1.2 mg/dL Final   GFR, Estimated 02/22/2021 44 (A)  >60 mL/min Final   Anion gap 02/22/2021 13  5 - 15 Final   WBC 02/22/2021 13.8 (A)  4.0 - 10.5 K/uL Final   RBC 02/22/2021 4.31  3.87 - 5.11 MIL/uL Final   Hemoglobin 02/22/2021 14.2  12.0 - 15.0 g/dL Final   HCT 02/22/2021 42.6  36.0 - 46.0 % Final   MCV 02/22/2021 98.8  80.0 - 100.0 fL Final   MCH 02/22/2021 32.9  26.0 - 34.0 pg Final   MCHC 02/22/2021 33.3  30.0 - 36.0 g/dL Final   RDW 02/22/2021 15.1  11.5 - 15.5 % Final   Platelets 02/22/2021 82 (A)  150 - 400 K/uL Final   nRBC 02/22/2021 0.1  0.0 - 0.2 % Final   Neutrophils Relative % 02/22/2021 62  % Final   Neutro Abs 02/22/2021 8.6 (A)  1.7 - 7.7 K/uL Final   Lymphocytes Relative 02/22/2021 22  % Final   Lymphs Abs 02/22/2021 3.0  0.7 - 4.0 K/uL Final   Monocytes Relative 02/22/2021 13  % Final    Monocytes Absolute 02/22/2021 1.8 (A)  0.1 - 1.0 K/uL Final   Eosinophils Relative 02/22/2021 2  % Final   Eosinophils Absolute 02/22/2021 0.2  0.0 - 0.5 K/uL Final   Basophils Relative 02/22/2021 0  % Final   Basophils Absolute 02/22/2021 0.1  0.0 - 0.1 K/uL Final   Immature Granulocytes 02/22/2021 1  % Final   Abs Immature Granulocytes 02/22/2021 0.11 (A)  0.00 - 0.07 K/uL Final   Prothrombin Time 02/22/2021 19.8 (A)  11.4 - 15.2 seconds Final   INR 02/22/2021 1.7 (A)  0.8 - 1.2 Final   Bilirubin, Direct 02/22/2021 1.4 (A)  0.0 - 0.2 mg/dL Final   GGT 02/22/2021 90 (A)  7 - 50 U/L Final   Alcohol, Ethyl (B) 02/22/2021 <10  <10 mg/dL Final   Opiates 02/22/2021 POSITIVE (A)  NONE DETECTED Final   Cocaine 02/22/2021 NONE DETECTED  NONE DETECTED Final   Benzodiazepines 02/22/2021 NONE DETECTED  NONE DETECTED Final   Amphetamines 02/22/2021 NONE DETECTED  NONE DETECTED Final   Tetrahydrocannabinol 02/22/2021 NONE DETECTED  NONE DETECTED Final   Barbiturates 02/22/2021 NONE DETECTED  NONE DETECTED Final   Triglycerides 02/22/2021 104  <150 mg/dL Final   WBC 02/23/2021 10.5  4.0 - 10.5 K/uL Final   RBC 02/23/2021 4.07  3.87 - 5.11 MIL/uL Final   Hemoglobin 02/23/2021 13.2  12.0 - 15.0 g/dL Final   HCT 02/23/2021 41.5  36.0 - 46.0 % Final   MCV 02/23/2021 102.0 (A)  80.0 - 100.0 fL Final   MCH 02/23/2021 32.4  26.0 - 34.0 pg Final   MCHC 02/23/2021 31.8  30.0 - 36.0 g/dL Final   RDW 02/23/2021 15.8 (A)  11.5 - 15.5 % Final   Platelets 02/23/2021 90 (A)  150 - 400 K/uL Final   nRBC 02/23/2021 0.3 (A)  0.0 - 0.2 % Final   Sodium 02/23/2021 137  135 - 145 mmol/L Final   Potassium 02/23/2021 3.5  3.5 - 5.1 mmol/L Final   Chloride 02/23/2021 107  98 - 111 mmol/L Final   CO2 02/23/2021 22  22 - 32 mmol/L Final   Glucose, Bld 02/23/2021 91  70 - 99 mg/dL Final   BUN 02/23/2021 18  8 - 23 mg/dL Final   Creatinine, Ser 02/23/2021 1.20 (A)  0.44 - 1.00 mg/dL Final   Calcium 02/23/2021 6.7 (A)   8.9 - 10.3 mg/dL Final   Total Protein 02/23/2021 6.9  6.5 - 8.1 g/dL Final   Albumin 02/23/2021 2.8 (A)  3.5 - 5.0 g/dL Final   AST 02/23/2021 62 (A)  15 - 41 U/L Final   ALT 02/23/2021 39  0 - 44 U/L Final   Alkaline Phosphatase 02/23/2021 62  38 - 126 U/L Final   Total Bilirubin 02/23/2021 2.9 (A)  0.3 - 1.2 mg/dL Final   GFR, Estimated 02/23/2021 50 (A)  >60 mL/min Final   Anion gap 02/23/2021 8  5 - 15  Final   Magnesium 02/23/2021 1.8  1.7 - 2.4 mg/dL Final   Sodium 02/24/2021 137  135 - 145 mmol/L Final   Potassium 02/24/2021 3.5  3.5 - 5.1 mmol/L Final   Chloride 02/24/2021 109  98 - 111 mmol/L Final   CO2 02/24/2021 22  22 - 32 mmol/L Final   Glucose, Bld 02/24/2021 135 (A)  70 - 99 mg/dL Final   BUN 02/24/2021 12  8 - 23 mg/dL Final   Creatinine, Ser 02/24/2021 1.18 (A)  0.44 - 1.00 mg/dL Final   Calcium 02/24/2021 7.6 (A)  8.9 - 10.3 mg/dL Final   GFR, Estimated 02/24/2021 51 (A)  >60 mL/min Final   Anion gap 02/24/2021 6  5 - 15 Final   Color, Urine 02/24/2021 AMBER (A)  YELLOW Final   APPearance 02/24/2021 CLEAR  CLEAR Final   Specific Gravity, Urine 02/24/2021 1.020  1.005 - 1.030 Final   pH 02/24/2021 5.0  5.0 - 8.0 Final   Glucose, UA 02/24/2021 NEGATIVE  NEGATIVE mg/dL Final   Hgb urine dipstick 02/24/2021 SMALL (A)  NEGATIVE Final   Bilirubin Urine 02/24/2021 NEGATIVE  NEGATIVE Final   Ketones, ur 02/24/2021 NEGATIVE  NEGATIVE mg/dL Final   Protein, ur 02/24/2021 30 (A)  NEGATIVE mg/dL Final   Nitrite 02/24/2021 NEGATIVE  NEGATIVE Final   Leukocytes,Ua 02/24/2021 NEGATIVE  NEGATIVE Final   RBC / HPF 02/24/2021 0-5  0 - 5 RBC/hpf Final   WBC, UA 02/24/2021 0-5  0 - 5 WBC/hpf Final   Bacteria, UA 02/24/2021 FEW (A)  NONE SEEN Final   Squamous Epithelial / LPF 02/24/2021 6-10  0 - 5 Final   Sodium 02/25/2021 134 (A)  135 - 145 mmol/L Final   Potassium 02/25/2021 3.8  3.5 - 5.1 mmol/L Final   Chloride 02/25/2021 103  98 - 111 mmol/L Final   CO2 02/25/2021 24  22 -  32 mmol/L Final   Glucose, Bld 02/25/2021 112 (A)  70 - 99 mg/dL Final   BUN 02/25/2021 8  8 - 23 mg/dL Final   Creatinine, Ser 02/25/2021 1.08 (A)  0.44 - 1.00 mg/dL Final   Calcium 02/25/2021 8.4 (A)  8.9 - 10.3 mg/dL Final   GFR, Estimated 02/25/2021 57 (A)  >60 mL/min Final   Anion gap 02/25/2021 7  5 - 15 Final   WBC 02/25/2021 9.1  4.0 - 10.5 K/uL Final   RBC 02/25/2021 3.38 (A)  3.87 - 5.11 MIL/uL Final   Hemoglobin 02/25/2021 11.2 (A)  12.0 - 15.0 g/dL Final   HCT 02/25/2021 34.9 (A)  36.0 - 46.0 % Final   MCV 02/25/2021 103.3 (A)  80.0 - 100.0 fL Final   MCH 02/25/2021 33.1  26.0 - 34.0 pg Final   MCHC 02/25/2021 32.1  30.0 - 36.0 g/dL Final   RDW 02/25/2021 15.4  11.5 - 15.5 % Final   Platelets 02/25/2021 119 (A)  150 - 400 K/uL Final   nRBC 02/25/2021 0.8 (A)  0.0 - 0.2 % Final    Imaging Studies  No results found. Medications   Scheduled Meds:  enoxaparin (LOVENOX) injection  40 mg Subcutaneous Q24H   lipase/protease/amylase  12,000 Units Oral TID WC   metoprolol succinate  25 mg Oral Daily   pantoprazole (PROTONIX) IV  40 mg Intravenous Q12H   polyethylene glycol  17 g Oral BID   senna  1 tablet Oral Daily   sorbitol, milk of mag, mineral oil, glycerin (SMOG) enema  960 mL Rectal Once  No recently discontinued medications to reconcile  LOS: 3 days   Time spent: >28min  Fabian Coca L Alexas Basulto, DO [facility] 02/25/2021, 11:39 AM   Please refer to amion to contact the [facility] Attending or Consulting provider for this pt  [email or web address] Available by Epic secure chat 7AM-7PM. If 7PM-7AM, please contact night-coverage

## 2021-02-26 DIAGNOSIS — K5900 Constipation, unspecified: Secondary | ICD-10-CM | POA: Diagnosis not present

## 2021-02-26 DIAGNOSIS — N183 Chronic kidney disease, stage 3 unspecified: Secondary | ICD-10-CM | POA: Diagnosis not present

## 2021-02-26 DIAGNOSIS — K859 Acute pancreatitis without necrosis or infection, unspecified: Secondary | ICD-10-CM | POA: Diagnosis not present

## 2021-02-26 DIAGNOSIS — K7031 Alcoholic cirrhosis of liver with ascites: Secondary | ICD-10-CM | POA: Diagnosis not present

## 2021-02-26 MED ORDER — PANTOPRAZOLE SODIUM 40 MG PO TBEC
40.0000 mg | DELAYED_RELEASE_TABLET | Freq: Two times a day (BID) | ORAL | Status: DC
  Administered 2021-02-26 – 2021-03-06 (×16): 40 mg via ORAL
  Filled 2021-02-26 (×16): qty 1

## 2021-02-26 NOTE — Progress Notes (Signed)
The patient is injury-free, afebrile, alert, and oriented X 4. Vital signs were within the baseline during this shift. Pt's abdominal pain is controlled with current pain regiment. She denies chest pain, SOB, nausea, vomiting, dizziness, signs or symptoms of bleeding, or acute changes during this shift. We will continue to monitor and work toward achieving the care plan goals

## 2021-02-26 NOTE — Progress Notes (Signed)
PROGRESS NOTE    Connie [name]  BHA:[id number] DOB: 1955-09-11 DOA: 02/21/2021 PCP: Arthur Holms, NP    Brief Narrative:  Connie [name] was admitted to the hospital with the working diagnosis of acute pancreatitis.   65 year old female past medical history for alcoholic cirrhosis, stage IIIb chronic kidney disease, and tobacco abuse who presented with abdominal pain.  She reported 3-4 days of progressive sharp abdominal pain, epigastric, radiated into the bilateral upper quadrants.  Worsening symptoms when trying to eat or drink.  Intermittent nausea but no vomiting.  On her initial physical examination she was febrile 100.6 F, heart rate 111, blood pressure 118/67, respiratory rate 16, oxygen saturation 93%, her lungs are clear to auscultation bilaterally, heart S1-S2, present, rhythmic, soft abdomen, mildly tender to the epigastrium and bilateral upper quadrants, no rebound or guarding, no lower extremity edema.  Sodium 136, potassium 3.2, chloride 101, bicarb 24, glucose 115, BUN 21, creatinine 1.51, magnesium 1.7, lipase 213, AST 102, ALT 67, total bilirubin 3.3, white count 15.4, hemoglobin 16.6, hematocrit 49.0, platelets 102. SARS COVID-19 negative.  Urinalysis [place] gravity 1.020, 30 protein, 0-5 red cells, 0-5 white cells. Toxicology screen alcohol less than 10, positive opiates.  CT abdomen pelvis acute pancreatitis, no abscess or pseudocyst.  Cirrhosis with small ascites.  Colonic diverticulosis.  Chest radiograph no infiltrates.  EKG 70 bpm, left axis deviation, left anterior fascicular block, normal intervals, sinus rhythm, no significant ST segment T wave changes.  Patient was placed on intravenous fluids and as needed analgesics/antiemetics. Her symptoms slowly improving.  Assessment & Plan:   Principal Problem:   Acute pancreatitis Active Problems:   Alcoholic cirrhosis of liver with ascites (HCC)   Dehydration   Fever   Hypokalemia   CKD (chronic kidney disease)  stage 3, GFR 30-59 ml/min (HCC)   Acute constipation   Acute pancreatitis. Patient continue to have abdominal pain, lower abdomen exacerbated by large bowel movements post enema.  No nausea or vomiting, no dyspnea. Tolerating clears well.  Plan: Advance diet to soft, continue pain control, as needed antiemetics Continue with antiacid therapy.   2. Urinary tract infection. Plan for short course of antibiotic therapy 3 days.   3. AKI on CKD stage 3- hypokalemia renal function with serum cr at 1,0, K is 3,8 and serum bicarbonate at 24. Plan to continue to hold on IV fluids and advance diet to soft  4. Paroxysmal atrial fibrillation continue rate control with metoprolol Currently not on anticoagulation   5. Alcohol related liver cirrhosis, Hep C. Chronic anemia. Thrombocytopenia.  AST 62 and ALT 39, no signs of encephalopathy or coagulopathy. Continue supportive medical care and follow up as outpatient.   Hgb stable at 11,2 with hct at 34,9 with Plt 119  6. Obesity class 2. Calculated BMI is 36,7  Patient continue to be at high risk for worsening pancreatitis   Status is: Inpatient  DVT prophylaxis: Scd   Code Status:    full  Family Communication:  no family at the bedside        Subjective: Patient with no nausea or vomiting, no chest pain or dyspnea, positive abdominal pain, lower abdomen due to large bowel movement yesterday   Objective: Vitals:   02/25/21 2016 02/26/21 0425 02/26/21 0442 02/26/21 1208  BP: (!) 153/84 139/81  (!) 152/73  Pulse: 81 71  72  Resp: 20 16    Temp: 98.4 F (36.9 C) 98.3 F (36.8 C)    TempSrc: Oral Oral    SpO2:  98% 99%    Weight:   106.4 kg   Height:        Intake/Output Summary (Last 24 hours) at 02/26/2021 1255 Last data filed at 02/25/2021 1700 Gross per 24 hour  Intake 240 ml  Output --  Net 240 ml   Filed Weights   02/21/21 1922 02/24/21 0446 02/26/21 0442  Weight: 105.3 kg 108.7 kg 106.4 kg    Examination:    General: Not in pain or dyspnea, deconditioned  Neurology: Awake and alert, non focal  E ENT: mils pallor, no icterus, oral mucosa moist Cardiovascular: No JVD. S1-S2 present, rhythmic, no gallops, rubs, or murmurs. No lower extremity edema. Pulmonary: vesicular breath sounds bilaterally, adequate air movement, no wheezing, rhonchi or rales. Gastrointestinal. Abdomen distended, mild tender to deep palpation, no guarding or rebound, no ascites.  Skin. No rashes Musculoskeletal: no joint deformities     Data Reviewed: I have personally reviewed following labs and imaging studies  CBC: Recent Labs  Lab 02/21/21 1538 02/22/21 0412 02/23/21 0456 02/25/21 0435  WBC 15.4* 13.8* 10.5 9.1  NEUTROABS 11.0* 8.6*  --   --   HGB 16.6* 14.2 13.2 11.2*  HCT 49.0* 42.6 41.5 34.9*  MCV 97.6 98.8 102.0* 103.3*  PLT 102* 82* 90* 469*   Basic Metabolic Panel: Recent Labs  Lab 02/21/21 1538 02/22/21 0412 02/23/21 0456 02/24/21 0451 02/25/21 0435  NA 136 140 137 137 134*  K 3.2* 3.2* 3.5 3.5 3.8  CL 101 106 107 109 103  CO2 24 21* 22 22 24   GLUCOSE 115* 101* 91 135* 112*  BUN 21 23 18 12 8   CREATININE 1.51* 1.34* 1.20* 1.18* 1.08*  CALCIUM 7.4* 6.6* 6.7* 7.6* 8.4*  MG 1.7 1.7 1.8  --   --    GFR: Estimated Creatinine Clearance: 65.2 mL/min (A) (by C-G formula based on SCr of 1.08 mg/dL (H)). Liver Function Tests: Recent Labs  Lab 02/21/21 1538 02/22/21 0412 02/23/21 0456  AST 102* 78* 62*  ALT 67* 49* 39  ALKPHOS 89 64 62  BILITOT 3.3* 2.7* 2.9*  PROT 7.5 6.6 6.9  ALBUMIN 3.4* 2.8* 2.8*   Recent Labs  Lab 02/21/21 1538  LIPASE 213*   No results for input(s): AMMONIA in the last 168 hours. Coagulation Profile: Recent Labs  Lab 02/22/21 0412  INR 1.7*   Cardiac Enzymes: No results for input(s): CKTOTAL, CKMB, CKMBINDEX, TROPONINI in the last 168 hours. BNP (last 3 results) No results for input(s): PROBNP in the last 8760 hours. HbA1C: No results for input(s):  HGBA1C in the last 72 hours. CBG: No results for input(s): GLUCAP in the last 168 hours. Lipid Profile: No results for input(s): CHOL, HDL, LDLCALC, TRIG, CHOLHDL, LDLDIRECT in the last 72 hours. Thyroid Function Tests: No results for input(s): TSH, T4TOTAL, FREET4, T3FREE, THYROIDAB in the last 72 hours. Anemia Panel: No results for input(s): VITAMINB12, FOLATE, FERRITIN, TIBC, IRON, RETICCTPCT in the last 72 hours.    Radiology Studies: I have reviewed all of the imaging during this hospital visit personally     Scheduled Meds:  cephALEXin  500 mg Oral Q8H   diphenhydrAMINE  25 mg Oral Once   enoxaparin (LOVENOX) injection  40 mg Subcutaneous Q24H   lipase/protease/amylase  12,000 Units Oral TID WC   metoprolol succinate  25 mg Oral Daily   pantoprazole (PROTONIX) IV  40 mg Intravenous Q12H   polyethylene glycol  17 g Oral BID   senna  1 tablet Oral Daily  Continuous Infusions:   LOS: 4 days        Allisson Schindel Gerome Apley, MD

## 2021-02-26 NOTE — Plan of Care (Signed)
  Problem: Health Behavior/Discharge Planning: Goal: Ability to manage health-related needs will improve Outcome: Progressing   Problem: Clinical Measurements: Goal: Ability to maintain clinical measurements within normal limits will improve Outcome: Progressing Goal: Will remain free from infection Outcome: Progressing Goal: Diagnostic test results will improve Outcome: Progressing Goal: Respiratory complications will improve Outcome: Progressing Goal: Cardiovascular complication will be avoided Outcome: Progressing   Problem: Activity: Goal: Risk for activity intolerance will decrease Outcome: Progressing   Problem: Elimination: Goal: Will not experience complications related to bowel motility Outcome: Progressing Goal: Will not experience complications related to urinary retention Outcome: Progressing   Problem: Elimination: Goal: Will not experience complications related to urinary retention Outcome: Progressing   Problem: Skin Integrity: Goal: Risk for impaired skin integrity will decrease Outcome: Progressing   Problem: Safety: Goal: Ability to remain free from injury will improve Outcome: Progressing

## 2021-02-27 DIAGNOSIS — E876 Hypokalemia: Secondary | ICD-10-CM | POA: Diagnosis not present

## 2021-02-27 DIAGNOSIS — K5900 Constipation, unspecified: Secondary | ICD-10-CM | POA: Diagnosis not present

## 2021-02-27 DIAGNOSIS — N183 Chronic kidney disease, stage 3 unspecified: Secondary | ICD-10-CM | POA: Diagnosis not present

## 2021-02-27 DIAGNOSIS — K859 Acute pancreatitis without necrosis or infection, unspecified: Secondary | ICD-10-CM | POA: Diagnosis not present

## 2021-02-27 LAB — CBC
HCT: 35.6 % — ABNORMAL LOW (ref 36.0–46.0)
Hemoglobin: 11.8 g/dL — ABNORMAL LOW (ref 12.0–15.0)
MCH: 33.4 pg (ref 26.0–34.0)
MCHC: 33.1 g/dL (ref 30.0–36.0)
MCV: 100.8 fL — ABNORMAL HIGH (ref 80.0–100.0)
Platelets: 152 10*3/uL (ref 150–400)
RBC: 3.53 MIL/uL — ABNORMAL LOW (ref 3.87–5.11)
RDW: 15.5 % (ref 11.5–15.5)
WBC: 9.8 10*3/uL (ref 4.0–10.5)
nRBC: 0.6 % — ABNORMAL HIGH (ref 0.0–0.2)

## 2021-02-27 LAB — BASIC METABOLIC PANEL
Anion gap: 5 (ref 5–15)
BUN: 8 mg/dL (ref 8–23)
CO2: 28 mmol/L (ref 22–32)
Calcium: 8.7 mg/dL — ABNORMAL LOW (ref 8.9–10.3)
Chloride: 102 mmol/L (ref 98–111)
Creatinine, Ser: 1.07 mg/dL — ABNORMAL HIGH (ref 0.44–1.00)
GFR, Estimated: 58 mL/min — ABNORMAL LOW (ref >60)
Glucose, Bld: 106 mg/dL — ABNORMAL HIGH (ref 70–99)
Potassium: 3.6 mmol/L (ref 3.5–5.1)
Sodium: 135 mmol/L (ref 135–145)

## 2021-02-27 NOTE — Progress Notes (Signed)
PROGRESS NOTE    Connie [name]  QJJ:[id number] DOB: 10/13/1955 DOA: 02/21/2021 PCP: Arthur Holms, NP    Brief Narrative:  Mrs. Connie [name] was admitted to the hospital with the working diagnosis of acute pancreatitis.    65 year old female past medical history for alcoholic cirrhosis, stage IIIb chronic kidney disease, and tobacco abuse who presented with abdominal pain.  She reported 3-4 days of progressive sharp abdominal pain, epigastric, radiated into the bilateral upper quadrants.  Worsening symptoms when trying to eat or drink.  Intermittent nausea but no vomiting.  On her initial physical examination she was febrile 100.6 F, heart rate 111, blood pressure 118/67, respiratory rate 16, oxygen saturation 93%, her lungs are clear to auscultation bilaterally, heart S1-S2, present, rhythmic, soft abdomen, mildly tender to the epigastrium and bilateral upper quadrants, no rebound or guarding, no lower extremity edema.   Sodium 136, potassium 3.2, chloride 101, bicarb 24, glucose 115, BUN 21, creatinine 1.51, magnesium 1.7, lipase 213, AST 102, ALT 67, total bilirubin 3.3, white count 15.4, hemoglobin 16.6, hematocrit 49.0, platelets 102. SARS COVID-19 negative.   Urinalysis [place] gravity 1.020, 30 protein, 0-5 red cells, 0-5 white cells. Toxicology screen alcohol less than 10, positive opiates.   CT abdomen pelvis acute pancreatitis, no abscess or pseudocyst.  Cirrhosis with small ascites.  Colonic diverticulosis.   Chest radiograph no infiltrates.   EKG 70 bpm, left axis deviation, left anterior fascicular block, normal intervals, sinus rhythm, no significant ST segment T wave changes.   Patient was placed on intravenous fluids and as needed analgesics/antiemetics. Her symptoms slowly improving.   Assessment & Plan:   Principal Problem:   Acute pancreatitis Active Problems:   Alcoholic cirrhosis of liver with ascites (HCC)   Dehydration   Fever   Hypokalemia   CKD (chronic kidney  disease) stage 3, GFR 30-59 ml/min (HCC)   Acute constipation     Acute pancreatitis.  Abdominal pain has been improving but not yet back to baseline, she is tolerating well soft diet, with no nausea or vomiting.    Continue antiacid therapy, as needed analgesics and antiemetics.  Out of bed to chair tid with meals. Pt and Ot evaluations. Possible dc home in am, depending on her symptoms.    2. Urinary tract infection.  Resolved she has completed 3 days of antibiotic therapy.     3. AKI on CKD stage 3- hypokalemia  Patient off IV fluids, her renal function is stable with serum cr at 1,0 with K at 3,6, and bicarbonate at 28.    4. Paroxysmal atrial fibrillation  On metoprolol for rate control, patient is not on anticoagulation.    5. Alcohol related liver cirrhosis, Hep C. Chronic anemia. Thrombocytopenia.  No signs of decompensations. Cell count with Hgb at 11,8, Hct 35,6 and resolution of thrombocytopenia with platelet count at 152.    6. Obesity class 2. Calculated BMI is 36,7  Status is: Inpatient   DVT prophylaxis: Scd   Code Status:   full  Family Communication:   No family at the bedside      Subjective: Patient with no nausea or vomiting, her abdominal pain has improved but not back to baseline, positive bowel movement, with no dyspnea or chest pain.   Objective: Vitals:   02/26/21 2043 02/27/21 0318 02/27/21 0340 02/27/21 1000  BP: (!) 155/76 137/87  (!) 142/73  Pulse: 75 66  74  Resp: 16 16    Temp: 98.5 F (36.9 C) 98 F (36.7 C)  TempSrc: Oral Oral    SpO2: 98% 96%    Weight:   105.6 kg   Height:       No intake or output data in the 24 hours ending 02/27/21 1225 Filed Weights   02/24/21 0446 02/26/21 0442 02/27/21 0340  Weight: 108.7 kg 106.4 kg 105.6 kg    Examination:   General: Not in pain or dyspnea Neurology: Awake and alert, non focal  E ENT: mild pallor, no icterus, oral mucosa moist Cardiovascular: No JVD. S1-S2 present, rhythmic, no  gallops, rubs, or murmurs. No lower extremity edema. Pulmonary: vesicular breath sounds bilaterally, adequate air movement, no wheezing, rhonchi or rales. Gastrointestinal. Abdomen mild distended, not tender to superficial palpation, no guarding or rebound Skin. No rashes Musculoskeletal: no joint deformities     Data Reviewed: I have personally reviewed following labs and imaging studies  CBC: Recent Labs  Lab 02/21/21 1538 02/22/21 0412 02/23/21 0456 02/25/21 0435 02/27/21 0531  WBC 15.4* 13.8* 10.5 9.1 9.8  NEUTROABS 11.0* 8.6*  --   --   --   HGB 16.6* 14.2 13.2 11.2* 11.8*  HCT 49.0* 42.6 41.5 34.9* 35.6*  MCV 97.6 98.8 102.0* 103.3* 100.8*  PLT 102* 82* 90* 119* 811   Basic Metabolic Panel: Recent Labs  Lab 02/21/21 1538 02/22/21 0412 02/23/21 0456 02/24/21 0451 02/25/21 0435 02/27/21 0531  NA 136 140 137 137 134* 135  K 3.2* 3.2* 3.5 3.5 3.8 3.6  CL 101 106 107 109 103 102  CO2 24 21* 22 22 24 28   GLUCOSE 115* 101* 91 135* 112* 106*  BUN 21 23 18 12 8 8   CREATININE 1.51* 1.34* 1.20* 1.18* 1.08* 1.07*  CALCIUM 7.4* 6.6* 6.7* 7.6* 8.4* 8.7*  MG 1.7 1.7 1.8  --   --   --    GFR: Estimated Creatinine Clearance: 65.5 mL/min (A) (by C-G formula based on SCr of 1.07 mg/dL (H)). Liver Function Tests: Recent Labs  Lab 02/21/21 1538 02/22/21 0412 02/23/21 0456  AST 102* 78* 62*  ALT 67* 49* 39  ALKPHOS 89 64 62  BILITOT 3.3* 2.7* 2.9*  PROT 7.5 6.6 6.9  ALBUMIN 3.4* 2.8* 2.8*   Recent Labs  Lab 02/21/21 1538  LIPASE 213*   No results for input(s): AMMONIA in the last 168 hours. Coagulation Profile: Recent Labs  Lab 02/22/21 0412  INR 1.7*   Cardiac Enzymes: No results for input(s): CKTOTAL, CKMB, CKMBINDEX, TROPONINI in the last 168 hours. BNP (last 3 results) No results for input(s): PROBNP in the last 8760 hours. HbA1C: No results for input(s): HGBA1C in the last 72 hours. CBG: No results for input(s): GLUCAP in the last 168 hours. Lipid  Profile: No results for input(s): CHOL, HDL, LDLCALC, TRIG, CHOLHDL, LDLDIRECT in the last 72 hours. Thyroid Function Tests: No results for input(s): TSH, T4TOTAL, FREET4, T3FREE, THYROIDAB in the last 72 hours. Anemia Panel: No results for input(s): VITAMINB12, FOLATE, FERRITIN, TIBC, IRON, RETICCTPCT in the last 72 hours.    Radiology Studies: I have reviewed all of the imaging during this hospital visit personally     Scheduled Meds:  cephALEXin  500 mg Oral Q8H   diphenhydrAMINE  25 mg Oral Once   enoxaparin (LOVENOX) injection  40 mg Subcutaneous Q24H   lipase/protease/amylase  12,000 Units Oral TID WC   metoprolol succinate  25 mg Oral Daily   pantoprazole  40 mg Oral BID   polyethylene glycol  17 g Oral BID   senna  1  tablet Oral Daily   Continuous Infusions:   LOS: 5 days        Monterius Rolf Gerome Apley, MD

## 2021-02-27 NOTE — Plan of Care (Signed)

## 2021-02-27 NOTE — Progress Notes (Signed)
The patient is injury-free, afebrile, alert, and oriented X 4. Vital signs were within the baseline during this shift. Pt's abdominal pain is controlled with current pain regiment and improving. She denies chest pain, SOB, nausea, vomiting, dizziness, signs or symptoms of bleeding, or acute changes during this shift. We will continue to monitor and work toward achieving the care plan goals

## 2021-02-28 ENCOUNTER — Inpatient Hospital Stay (HOSPITAL_COMMUNITY): Payer: Medicare Other

## 2021-02-28 DIAGNOSIS — R1084 Generalized abdominal pain: Secondary | ICD-10-CM | POA: Diagnosis not present

## 2021-02-28 DIAGNOSIS — R109 Unspecified abdominal pain: Secondary | ICD-10-CM | POA: Diagnosis present

## 2021-02-28 DIAGNOSIS — K5900 Constipation, unspecified: Secondary | ICD-10-CM | POA: Diagnosis not present

## 2021-02-28 DIAGNOSIS — K7031 Alcoholic cirrhosis of liver with ascites: Secondary | ICD-10-CM | POA: Diagnosis not present

## 2021-02-28 DIAGNOSIS — K859 Acute pancreatitis without necrosis or infection, unspecified: Secondary | ICD-10-CM | POA: Diagnosis not present

## 2021-02-28 NOTE — Progress Notes (Signed)
PROGRESS NOTE  Connie [name]    DOB: 26-Aug-1955, 65 y.o.  CNO:[id number]  PCP: Arthur Holms, NP   Code Status: Full Code   DOA: 02/21/2021   LOS: 6  Brief Narrative of Current Hospitalization  Connie [name] is a 65 y.o. female with a PMH significant for alcoholic cirrhosis, stage III CKD, tobacco use. They presented from home to the ED on 02/21/2021 with epigastric pain, anorexia, nausea/vomiting x 4 days. In the ED, it was found that they had acute pancreatitis. They were treated with IV fluids and analgesics treatment.  Patient was admitted to medicine service for further workup and management of acute pancreatitis as outlined in detail below.  02/28/21 -stable  Assessment & Plan  Principal Problem:   Acute pancreatitis Active Problems:   Alcoholic cirrhosis of liver with ascites (HCC)   Dehydration   Fever   Hypokalemia   CKD (chronic kidney disease) stage 3, GFR 30-59 ml/min (HCC)   Acute constipation  Idiopathic Acute pancreatitis without signs of infection or necrosis-triglycerides 104, no signs of bile duct obstruction, blood alcohol negative on admission (although does have history of heavy alcohol use), no trauma.  -Analgesia as needed -Zofran as needed -Continue IV maintenance fluids -Advance diet as tolerated -Creon  Acute constipation- endorses having bowel movements but unable to characterize them. Abdominal discomfort improved. KUB today negative for large stool burden or obvious obstruction - miralax BID - senna daily - SMOG enema - strict I/O  Dysuria- resolved - keflex (10/28-10/30) - follow up urology OP for microscopic hematuria  Alcoholic liver cirrhosis with ascites  H/o hepatitis C with treatment history unknown-AST 62, ALT 39, platelets 90, INR 1.7 on admission -Continue to monitor -Follow-up outpatient with GI  Hypokalemia-resolved, stable.   AKI (resolved) CKD stage III-appears to be at baseline, stable -Avoid nephrotoxic agents  Tobacco  use-  -Cessation counseling -Nicotine patch  PAF-rate controlled.  Home meds do not include anticoagulation.  Will discuss further with patient in a.m. -Continue home metoprolol  Obesity-  - counseling on lifestyle changes  DVT prophylaxis: enoxaparin (LOVENOX) injection 40 mg Start: 02/23/21 1000   Diet:  Diet Orders (From admission, onward)     Start     Ordered   02/26/21 1512  DIET SOFT Room service appropriate? Yes; Fluid consistency: Thin  Diet effective now       Question Answer Comment  Room service appropriate? Yes   Fluid consistency: Thin      02/26/21 1511            Subjective 02/28/21    Pt reports continued discomfort with some improvement. She really wants to go home but still having difficulty with eating.   Disposition Plan & Communication  Patient status: Inpatient  Admitted From: Home Disposition: Home Anticipated discharge date: 11/1 if tolerating diet and has Bms.  Family Communication: None Consults, Procedures, Significant Events  Consultants:  None  Procedures/significant events:  None  Objective   Vitals:   02/27/21 1239 02/27/21 2030 02/28/21 0333 02/28/21 0343  BP: 132/68 (!) 145/90 (!) 159/78   Pulse: 66 65 69   Resp: 14 16 18    Temp: 99 F (37.2 C) 99.1 F (37.3 C) 98.7 F (37.1 C)   TempSrc: Oral Oral Oral   SpO2: 97% 100% 98%   Weight:    106.5 kg  Height:       No intake or output data in the 24 hours ending 02/28/21 [place] Weights   02/26/21 0442 02/27/21  0340 02/28/21 0343  Weight: 106.4 kg 105.6 kg 106.5 kg    Patient BMI: Body mass index is 36.77 kg/m.   Physical Exam: General: awake, alert, NAD HEENT: atraumatic, clear conjunctiva, anicteric sclera, moist mucus membranes, hearing grossly normal Respiratory: normal respiratory effort. Cardiovascular: normal S1/S2,  RRR, no JVD, murmurs, rubs, gallops, quick capillary refill  Gastrointestinal: soft, tenderness diffusely, especially R quadrants.  No  rebound or guarding Nervous: A&O x3. no gross focal neurologic deficits, normal speech Extremities: moves all equally, trace edema, normal tone Skin: dry, intact, normal temperature, normal color, No rashes, lesions or ulcers Psychiatry: anxious mood, congruent affect  Labs   I have personally reviewed following labs and imaging studies No results displayed because visit has over 200 results.      Imaging Studies  No results found. Medications   Scheduled Meds:  diphenhydrAMINE  25 mg Oral Once   enoxaparin (LOVENOX) injection  40 mg Subcutaneous Q24H   lipase/protease/amylase  12,000 Units Oral TID WC   metoprolol succinate  25 mg Oral Daily   pantoprazole  40 mg Oral BID   polyethylene glycol  17 g Oral BID   senna  1 tablet Oral Daily   No recently discontinued medications to reconcile  LOS: 6 days   Time spent: >26min  Divon Krabill L Hosam Mcfetridge, DO [facility] 02/28/2021, 11:40 AM   Please refer to amion to contact the [facility] Attending or Consulting provider for this pt  [email or web address] Available by Epic secure chat 7AM-7PM. If 7PM-7AM, please contact night-coverage

## 2021-03-01 ENCOUNTER — Inpatient Hospital Stay (HOSPITAL_COMMUNITY): Payer: Medicare Other

## 2021-03-01 NOTE — Progress Notes (Addendum)
PROGRESS NOTE    Connie [name]  QQI:[id number] DOB: [birth year] DOA: 02/21/2021 PCP: Arthur Holms, NP   Brief Narrative: Connie [name] is a 65 y.o. female with a PMH significant for alcoholic cirrhosis, stage III CKD, tobacco use. Patient presented secondary to epigastric pain, anorexia, nausea/vomiting x 4 days and was found to have acute pancreatitis.  Assessment & Plan:   Principal Problem:   Acute pancreatitis Active Problems:   Alcoholic cirrhosis of liver with ascites (HCC)   Dehydration   Fever   Hypokalemia   CKD (chronic kidney disease) stage 3, GFR 30-59 ml/min (HCC)   Acute constipation   Abdominal pain   Acute pancreatitis Lipase of 213 on admission. Patient managed with bowel rest with gradual improvement of symptoms. Tolerating oral diet at this time. Resolved.  Abdominal pain Unsure of etiology, but this was the reason the patient came in. Initial concern was acute pancreatitis which appears to be improved. On personal review of CT abdomen/pelvis, gallbladder enlarged without concerning features. -Attempt US paracentesis -General surgery consult   Constipation In setting of poor oral intake. No evidence of significant constipation on imaging.  Dysuria Presumed UTI Patient treated with three days of Keflex. Symptoms resolved.  Alcoholic liver cirrhosis with ascites History of hepatitis C Patient has recently established with a Hepat[facility] at [facility].  Hypokalemia Repleted. Resolved.  AKI on CKD stage IIIa Resolved with IV fluids. Creatinine at baseline  Tobacco use Counseled  Paroxysmal atrial fibrillation Currently rate controlled. Takes metoprolol as an outpatient. Not on anticoagulation. -Continue metoprolol  Obesity Body mass index is 36.29 kg/m.   DVT prophylaxis: Lovenox Code Status:   Code Status: Full Code Family Communication: Husband at bedside Disposition Plan: Discharge home possible in 24 hours pending continued  workup for abdominal pain   Consultants:  General surgery  Procedures:  None  Antimicrobials: Keflex    Subjective: Patient reports no significant bowel movement. Most bowel movements have been liquid and small. Recently started a soft diet about one day ago.  Objective: Vitals:   02/28/21 1957 03/01/21 0235 03/01/21 0333 03/01/21 0923  BP: 133/76  (!) 146/86 (!) 150/60  Pulse: 70  65 67  Resp: 20  20 18   Temp: 98.8 F (37.1 C)  98.3 F (36.8 C) 98.3 F (36.8 C)  TempSrc: Oral  Oral Oral  SpO2: 99%  96% 100%  Weight: 105.1 kg 105.1 kg    Height:       No intake or output data in the 24 hours ending 03/01/21 [place]   02/28/21 0343 02/28/21 1957 03/01/21 0235  Weight: 106.5 kg 105.1 kg 105.1 kg    Examination:  General exam: Appears calm and comfortable Respiratory system: Clear to auscultation. Respiratory effort normal. Cardiovascular system: S1 & S2 heard, RRR. No murmurs, rubs, gallops or clicks. Gastrointestinal system: Abdomen is distended, soft and significantly tender in epigastric region in addition to right quadrants. Normal bowel sounds heard. Central nervous system: Alert and oriented. No focal neurological deficits. Musculoskeletal: No edema. No calf tenderness Skin: No cyanosis. No rashes Psychiatry: Judgement and insight appear normal. Mood & affect appropriate.     Data Reviewed: I have personally reviewed following labs and imaging studies  CBC Lab Results  Component Value Date   WBC 9.8 02/27/2021   RBC 3.53 (L) 02/27/2021   HGB 11.8 (L) 02/27/2021   HCT 35.6 (L) 02/27/2021   MCV 100.8 (H) 02/27/2021   MCH 33.4 02/27/2021   PLT 152 02/27/2021  MCHC 33.1 02/27/2021   RDW 15.5 02/27/2021   LYMPHSABS 3.0 02/22/2021   MONOABS 1.8 (H) 02/22/2021   EOSABS 0.2 02/22/2021   BASOSABS 0.1 16/38/4665     Last metabolic panel Lab Results  Component Value Date   NA 135 02/27/2021   K 3.6 02/27/2021   CL 102 02/27/2021   CO2  28 02/27/2021   BUN 8 02/27/2021   CREATININE 1.07 (H) 02/27/2021   GLUCOSE 106 (H) 02/27/2021   GFRNONAA 58 (L) 02/27/2021   GFRAA 45 (L) 08/04/2019   CALCIUM 8.7 (L) 02/27/2021   PROT 6.9 02/23/2021   ALBUMIN 2.8 (L) 02/23/2021   BILITOT 2.9 (H) 02/23/2021   ALKPHOS 62 02/23/2021   AST 62 (H) 02/23/2021   ALT 39 02/23/2021   ANIONGAP 5 02/27/2021    CBG (last 3)  No results for input(s): GLUCAP in the last 72 hours.   GFR: Estimated Creatinine Clearance: 65.4 mL/min (A) (by C-G formula based on SCr of 1.07 mg/dL (H)).  Coagulation Profile: No results for input(s): INR, PROTIME in the last 168 hours.  Recent Results (from the past 240 hour(s))  Resp Panel by RT-PCR (Flu A&B, Covid) Nasopharyngeal Swab     Status: None   Collection Time: 02/22/21  3:41 AM   Specimen: Nasopharyngeal Swab; Nasopharyngeal(NP) swabs in vial transport medium  Result Value Ref Range Status   SARS Coronavirus 2 by RT PCR NEGATIVE NEGATIVE Final    Comment: (NOTE) SARS-CoV-2 target nucleic acids are NOT DETECTED.  The SARS-CoV-2 RNA is generally detectable in upper respiratory specimens during the acute phase of infection. The lowest concentration of SARS-CoV-2 viral copies this assay can detect is 138 copies/mL. A negative result does not preclude SARS-Cov-2 infection and should not be used as the sole basis for treatment or other patient management decisions. A negative result may occur with  improper specimen collection/handling, submission of specimen other than nasopharyngeal swab, presence of viral mutation(s) within the areas targeted by this assay, and inadequate number of viral copies(<138 copies/mL). A negative result must be combined with clinical observations, patient history, and epidemiological information. The expected result is Negative.  Fact Sheet for Patients:  EntrepreneurPulse.com.au  Fact Sheet for Healthcare Providers:   IncredibleEmployment.be  This test is no t yet approved or cleared by the [place] FDA and  has been authorized for detection and/or diagnosis of SARS-CoV-2 by FDA under an Emergency Use Authorization (EUA). This EUA will remain  in effect (meaning this test can be used) for the duration of the COVID-19 declaration under Section 564(b)(1) of the Act, 21 U.S.C.section 360bbb-3(b)(1), unless the authorization is terminated  or revoked sooner.       Influenza A by PCR NEGATIVE NEGATIVE Final   Influenza B by PCR NEGATIVE NEGATIVE Final    Comment: (NOTE) The Xpert Xpress SARS-CoV-2/FLU/RSV plus assay is intended as an aid in the diagnosis of influenza from Nasopharyngeal swab specimens and should not be used as a sole basis for treatment. Nasal washings and aspirates are unacceptable for Xpert Xpress SARS-CoV-2/FLU/RSV testing.  Fact Sheet for Patients: EntrepreneurPulse.com.au  Fact Sheet for Healthcare Providers: IncredibleEmployment.be  This test is not yet approved or cleared by the [place] FDA and has been authorized for detection and/or diagnosis of SARS-CoV-2 by FDA under an Emergency Use Authorization (EUA). This EUA will remain in effect (meaning this test can be used) for the duration of the COVID-19 declaration under Section 564(b)(1) of the Act, 21 U.S.C. section 360bbb-3(b)(1), unless the  authorization is terminated or revoked.  Performed at [facility], [place] [address], [place], [place] [address]         Radiology Studies: DG Abd 1 View  Result Date: 02/28/2021 CLINICAL DATA:  Abdominal pain. EXAM: ABDOMEN - 1 VIEW COMPARISON:  None. FINDINGS: The bowel gas pattern is normal. No radio-opaque calculi or other significant radiographic abnormality are seen. IMPRESSION: Negative. Electronically Signed   By: Marijo Conception M.D.   On: 02/28/2021 12:53   [place] ASCITES (ABDOMEN  LIMITED)  Result Date: 03/01/2021 CLINICAL DATA:  History of cirrhosis, now with abdominal distension. Please perform ascites search ultrasound and ultrasound-guided paracentesis as indicated. EXAM: LIMITED ABDOMEN ULTRASOUND FOR ASCITES TECHNIQUE: Limited ultrasound survey for ascites was performed in all four abdominal quadrants. COMPARISON:  CT abdomen pelvis-02/23/2019 FINDINGS: Sonographic evaluation of the abdomen is negative for any significant intra-abdominal ascites. No paracentesis attempted. IMPRESSION: No significant intra-abdominal ascites.  No paracentesis attempted. Electronically Signed   By: Sandi Mariscal M.D.   On: 03/01/2021 10:09        Scheduled Meds:  diphenhydrAMINE  25 mg Oral Once   enoxaparin (LOVENOX) injection  40 mg Subcutaneous Q24H   lipase/protease/amylase  12,000 Units Oral TID WC   metoprolol succinate  25 mg Oral Daily   pantoprazole  40 mg Oral BID   polyethylene glycol  17 g Oral BID   senna  1 tablet Oral Daily   Continuous Infusions:   LOS: 7 days     Cordelia Poche, MD [facility] 03/01/2021, 12:45 PM  If 7PM-7AM, please contact night-coverage [email or web address]

## 2021-03-01 NOTE — Consult Note (Signed)
Consult Note  Connie [name] [birth year]  [id number].    Requesting MD: Dr. Lonny Prude Chief Complaint/Reason for Consult: abdominal pain  HPI:  65 year old female with medical history significant for alcoholic cirrhosis, stage IIIb chronic kidney disease, gastritis, chronic tobacco abuse who presented to the [facility] emergency department on 10/24 with abdominal pain.  Pain was present for 3-4 days prior to presentation and she describes it as sharp in the epigastrium with associated nausea without emesis. She has never had similar prior episodes. Work-up in the ED significant for Tmax 100.44F, T bili 3.3 (2.7 on 08/04/19), lipase 213, INR 1.7, WBC 15.4.  CT showing inflammatory changes of the pancreas consistent with acute pancreatitis without evidence of necrosis, abscess, or pseudocyst. No evidence of gallbladder wall thickening, pericholecystic fluid, choledocholithiasis, common bile duct dilation, while showing cirrhosis with small ascites. Abdominal ultrasound showing gallbladder sludge without evidence of overt stones, and showed no evidence of gallbladder wall thickening, pericholecystic fluid, or choledocholithiasis, and no evidence of common bile duct dilation.   She was admitted to the hospitalist team for further evaluation and treatment of acute pancreatitis.  Since admission her abdominal pain has improved though is still present.  She denies any further nausea or emesis.  She has not had great p.o. intake and attributes this to low appetite and distaste for the hospital food.  Overall pain is not associated with eating/drinking.  Pain improves with rest and is exacerbated by movement.  She is taking miralax here and having daily bowel movements. ROS otherwise as below  She has a history of alcohol use but denies any recent use.  She has history of tobacco use but has not used cigarettes in the past 3 weeks.  Past surgical history includes cesarean section  ROS: Review of  Systems  Constitutional:  Negative for chills and fever.  Respiratory:  Negative for cough, shortness of breath and wheezing.   Cardiovascular:  Negative for chest pain and palpitations.  Gastrointestinal:  Positive for abdominal pain. Negative for blood in stool, constipation, diarrhea, heartburn, melena, nausea and vomiting.  Genitourinary:  Negative for dysuria and hematuria.   Family History  Problem Relation Age of Onset   Lung cancer Mother     Past Medical History:  Diagnosis Date   Alcohol abuse    Helicobacter pylori gastritis 08/07/2019   Hepatitis C antibody positive in blood - negative RNA    Hypertension     Past Surgical History:  Procedure Laterality Date   BIOPSY  08/03/2019   Procedure: BIOPSY;  Surgeon: Gatha Mayer, MD;  Location: [facility] ENDOSCOPY;  Service: Endoscopy;;   ESOPHAGOGASTRODUODENOSCOPY (EGD) WITH PROPOFOL N/A 08/03/2019   Procedure: ESOPHAGOGASTRODUODENOSCOPY (EGD) WITH PROPOFOL;  Surgeon: Gatha Mayer, MD;  Location: [facility] ENDOSCOPY;  Service: Endoscopy;  Laterality: N/A;    Social History:  reports that she has been smoking. She does not have any smokeless tobacco history on file. She reports that she does not currently use alcohol. She reports that she does not use drugs.  Allergies: No Known Allergies  Medications Prior to Admission  Medication Sig Dispense Refill   acetaminophen (TYLENOL) 500 MG tablet Take 1,000 mg by mouth every 6 (six) hours as needed for mild pain.     calcium carbonate (TUMS - DOSED IN MG ELEMENTAL CALCIUM) 500 MG chewable tablet Chew 1 tablet by mouth daily as needed for indigestion or heartburn.     co-enzyme Q-10 30 MG capsule Take 30 mg by mouth  3 (three) times a week.     diclofenac Sodium (VOLTAREN) 1 % GEL Apply 2 g topically 2 (two) times daily as needed (pain).     metoprolol succinate (TOPROL-XL) 25 MG 24 hr tablet Take 1 tablet (25 mg total) by mouth daily. 30 tablet 1   Omega-3 Fatty Acids (FISH OIL) 1000 MG CAPS  Take 1,000 mg by mouth daily.     pantoprazole (PROTONIX) 40 MG tablet Take 1 tablet (40 mg total) by mouth 2 (two) times daily. (Patient taking differently: Take 40 mg by mouth daily.) 60 tablet 1    Blood pressure (!) 150/60, pulse 67, temperature 98.3 F (36.8 C), temperature source Oral, resp. rate 18, height 5\' 7"  (1.702 m), weight 105.1 kg, SpO2 100 %. Physical Exam:  General: pleasant, WD, female who is laying in bed in NAD HEENT: head is normocephalic, atraumatic.  Sclera are noninjected.  Pupils equal and round.  Ears and nose without any masses or lesions.  Mouth is pink and moist Heart: regular, rate, and rhythm.  Normal s1,s2. No obvious murmurs, gallops, or rubs noted.  Palpable radial and pedal pulses bilaterally Lungs: CTAB, no wheezes, rhonchi, or rales noted.  Respiratory effort nonlabored Abd: soft, ND, +BS, no masses, hernias, or organomegaly. TTP supraumbilically and in epigastrium. No rebound or guarding concerning for peritonitis MS: all 4 extremities are symmetrical with no cyanosis, clubbing, or edema. Skin: warm and dry with no masses, lesions, or rashes Neuro: Cranial nerves 2-12 grossly intact, sensation is normal throughout Psych: A&Ox3 with an appropriate affect.   No results found for this or any previous visit (from the past 48 hour(s)). DG Abd 1 View  Result Date: 02/28/2021 CLINICAL DATA:  Abdominal pain. EXAM: ABDOMEN - 1 VIEW COMPARISON:  None. FINDINGS: The bowel gas pattern is normal. No radio-opaque calculi or other significant radiographic abnormality are seen. IMPRESSION: Negative. Electronically Signed   By: Marijo Conception M.D.   On: 02/28/2021 12:53   [place] ASCITES (ABDOMEN LIMITED)  Result Date: 03/01/2021 CLINICAL DATA:  History of cirrhosis, now with abdominal distension. Please perform ascites search ultrasound and ultrasound-guided paracentesis as indicated. EXAM: LIMITED ABDOMEN ULTRASOUND FOR ASCITES TECHNIQUE: Limited ultrasound survey for  ascites was performed in all four abdominal quadrants. COMPARISON:  CT abdomen pelvis-02/23/2019 FINDINGS: Sonographic evaluation of the abdomen is negative for any significant intra-abdominal ascites. No paracentesis attempted. IMPRESSION: No significant intra-abdominal ascites.  No paracentesis attempted. Electronically Signed   By: Sandi Mariscal M.D.   On: 03/01/2021 10:09      Assessment/Plan Alcoholic cirrhosis (CHILDS B) with acute pancreatitis  - CT 10/25 with inflammatory changes of the pancreas without pseudocyst or necrosis - lipase on admission was 213 - CT/US on admit show some biliary sludge in the gallbladder but not gallstones - pain remains present but is overall improved from admission - no indication for cholecystectomy at this time and I am not convinced her gallbladder is the source of her current abdominal pain. If patient needed any surgical intervention would generally recommend this be done at a tertiary center or somewhere with hepatology inpatient given high risk of decompensation in cirrhotic patients perioperatively - fortunately she has recently established care with transplant hepatology at [facility] and recommend she follow up with them - GI consult for medical management of acute pancreatitis and cirrhosis could be helpful - no indication for acute surgical intervention or involvement, we will sign off at this time. Please call if further questions or concerns.  FEN: soft VTE: lovenox ID: none  Hx of Hep C CKD, stage III Dysuria/UTI, resolved Tobacco use Paroxsymal A. 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

## 2021-03-02 LAB — CBC
HCT: 38 % (ref 36.0–46.0)
Hemoglobin: 12.5 g/dL (ref 12.0–15.0)
MCH: 32.9 pg (ref 26.0–34.0)
MCHC: 32.9 g/dL (ref 30.0–36.0)
MCV: 100 fL (ref 80.0–100.0)
Platelets: 212 10*3/uL (ref 150–400)
RBC: 3.8 MIL/uL — ABNORMAL LOW (ref 3.87–5.11)
RDW: 15.1 % (ref 11.5–15.5)
WBC: 10.7 10*3/uL — ABNORMAL HIGH (ref 4.0–10.5)
nRBC: 0.2 % (ref 0.0–0.2)

## 2021-03-02 LAB — COMPREHENSIVE METABOLIC PANEL
ALT: 34 U/L (ref 0–44)
AST: 52 U/L — ABNORMAL HIGH (ref 15–41)
Albumin: 2.5 g/dL — ABNORMAL LOW (ref 3.5–5.0)
Alkaline Phosphatase: 72 U/L (ref 38–126)
Anion gap: 4 — ABNORMAL LOW (ref 5–15)
BUN: 10 mg/dL (ref 8–23)
CO2: 29 mmol/L (ref 22–32)
Calcium: 9.3 mg/dL (ref 8.9–10.3)
Chloride: 101 mmol/L (ref 98–111)
Creatinine, Ser: 1.04 mg/dL — ABNORMAL HIGH (ref 0.44–1.00)
GFR, Estimated: 60 mL/min — ABNORMAL LOW (ref >60)
Glucose, Bld: 96 mg/dL (ref 70–99)
Potassium: 3.9 mmol/L (ref 3.5–5.1)
Sodium: 134 mmol/L — ABNORMAL LOW (ref 135–145)
Total Bilirubin: 1.1 mg/dL (ref 0.3–1.2)
Total Protein: 7.4 g/dL (ref 6.5–8.1)

## 2021-03-02 LAB — C-REACTIVE PROTEIN: CRP: 9.6 mg/dL — ABNORMAL HIGH (ref <1.0)

## 2021-03-02 LAB — CREATININE, SERUM
Creatinine, Ser: 1.05 mg/dL — ABNORMAL HIGH (ref 0.44–1.00)
GFR, Estimated: 59 mL/min — ABNORMAL LOW (ref >60)

## 2021-03-02 NOTE — Progress Notes (Signed)
PROGRESS NOTE    Connie [name]  JTT:[id number] DOB: Jul 31, 1955 DOA: 02/21/2021 PCP: Arthur Holms, NP   Brief Narrative: Connie [name] is a 65 y.o. female with a PMH significant for alcoholic cirrhosis, stage III CKD, tobacco use. Patient presented secondary to epigastric pain, anorexia, nausea/vomiting x 4 days and was found to have acute pancreatitis.  Assessment & Plan:   Principal Problem:   Acute pancreatitis Active Problems:   Alcoholic cirrhosis of liver with ascites (HCC)   Dehydration   Fever   Hypokalemia   CKD (chronic kidney disease) stage 3, GFR 30-59 ml/min (HCC)   Acute constipation   Abdominal pain   Acute pancreatitis Lipase of 213 on admission. Patient managed with bowel rest with gradual improvement of symptoms.  Patient initially stated she was tolerating a diet, but today she states her pain is worse with any foods other than water. -Regress to clear liquid diet -If worsening or continued persistent issues advancing, may need repeat imaging -CBC, CMP, CRP  Abdominal pain Unsure of etiology, but this was the reason the patient came in. Initial concern was acute pancreatitis which appeared to be improved. On personal review of CT abdomen/pelvis, gallbladder enlarged without concerning features. US paracentesis order without enough fluid for aspiration. General surgery consulted with no recommendations with regard to enlarged and possibly symptomatic gall bladder. Patient now stating abdominal pain is worse with food so possibly is more specifically related to pancreatitis.  Constipation In setting of poor oral intake. No evidence of significant constipation on imaging.  Dysuria Presumed UTI Patient treated with three days of Keflex. Symptoms resolved.  Alcoholic liver cirrhosis with ascites History of hepatitis C Patient has recently established with a Hepatologist at [facility].  Hypokalemia Repleted. Resolved.  AKI on CKD stage IIIa Resolved  with IV fluids. Creatinine at baseline  Tobacco use Counseled  Paroxysmal atrial fibrillation Currently rate controlled. Takes metoprolol as an outpatient. Not on anticoagulation. -Continue metoprolol  Obesity Body mass index is 35.84 kg/m.   DVT prophylaxis: Lovenox Code Status:   Code Status: Full Code Family Communication: None at bedside Disposition Plan: Discharge home pending ability to tolerate a solid diet   Consultants:  General surgery  Procedures:  None  Antimicrobials: Keflex    Subjective: Patient states today that her pain is actually better after only ingesting water. Food and other fluids have been aggravating her abdominal pain. No fevers overnight.  Objective: Vitals:   03/01/21 0923 03/01/21 1913 03/02/21 0357 03/02/21 0359  BP: (!) 150/60 139/66 (!) 159/80   Pulse: 67 65 66   Resp: 18 16 18    Temp: 98.3 F (36.8 C) 98.2 F (36.8 C) 98.5 F (36.9 C)   TempSrc: Oral Oral Oral   SpO2: 100% 97% 98%   Weight:    103.8 kg  Height:       No intake or output data in the 24 hours ending 03/02/21 0956 Filed Weights   02/28/21 1957 03/01/21 0235 03/02/21 0359  Weight: 105.1 kg 105.1 kg 103.8 kg    Examination:  General exam: Appears calm and comfortable Respiratory system: Clear to auscultation. Respiratory effort normal. Cardiovascular system: S1 & S2 heard, RRR. No murmurs, rubs, gallops or clicks. Gastrointestinal system: Abdomen is nondistended, soft and tender in epigastric and right quadrant with mild palpation. No organomegaly or masses felt. Normal bowel sounds heard. Central nervous system: Alert and oriented. No focal neurological deficits. Musculoskeletal: No edema. No calf tenderness Skin: No cyanosis. No rashes Psychiatry: Judgement  and insight appear normal. Mood & affect appropriate.     Data Reviewed: I have personally reviewed following labs and imaging studies  CBC Lab Results  Component Value Date   WBC 9.8 02/27/2021    RBC 3.53 (L) 02/27/2021   HGB 11.8 (L) 02/27/2021   HCT 35.6 (L) 02/27/2021   MCV 100.8 (H) 02/27/2021   MCH 33.4 02/27/2021   PLT 152 02/27/2021   MCHC 33.1 02/27/2021   RDW 15.5 02/27/2021   LYMPHSABS 3.0 02/22/2021   MONOABS 1.8 (H) 02/22/2021   EOSABS 0.2 02/22/2021   BASOSABS 0.1 46/65/9935     Last metabolic panel Lab Results  Component Value Date   NA 135 02/27/2021   K 3.6 02/27/2021   CL 102 02/27/2021   CO2 28 02/27/2021   BUN 8 02/27/2021   CREATININE 1.05 (H) 03/02/2021   GLUCOSE 106 (H) 02/27/2021   GFRNONAA 59 (L) 03/02/2021   GFRAA 45 (L) 08/04/2019   CALCIUM 8.7 (L) 02/27/2021   PROT 6.9 02/23/2021   ALBUMIN 2.8 (L) 02/23/2021   BILITOT 2.9 (H) 02/23/2021   ALKPHOS 62 02/23/2021   AST 62 (H) 02/23/2021   ALT 39 02/23/2021   ANIONGAP 5 02/27/2021    CBG (last 3)  No results for input(s): GLUCAP in the last 72 hours.   GFR: Estimated Creatinine Clearance: 66.2 mL/min (A) (by C-G formula based on SCr of 1.05 mg/dL (H)).  Coagulation Profile: No results for input(s): INR, PROTIME in the last 168 hours.  Recent Results (from the past 240 hour(s))  Resp Panel by RT-PCR (Flu A&B, Covid) Nasopharyngeal Swab     Status: None   Collection Time: 02/22/21  3:41 AM   Specimen: Nasopharyngeal Swab; Nasopharyngeal(NP) swabs in vial transport medium  Result Value Ref Range Status   SARS Coronavirus 2 by RT PCR NEGATIVE NEGATIVE Final    Comment: (NOTE) SARS-CoV-2 target nucleic acids are NOT DETECTED.  The SARS-CoV-2 RNA is generally detectable in upper respiratory specimens during the acute phase of infection. The lowest concentration of SARS-CoV-2 viral copies this assay can detect is 138 copies/mL. A negative result does not preclude SARS-Cov-2 infection and should not be used as the sole basis for treatment or other patient management decisions. A negative result may occur with  improper specimen collection/handling, submission of specimen  other than nasopharyngeal swab, presence of viral mutation(s) within the areas targeted by this assay, and inadequate number of viral copies(<138 copies/mL). A negative result must be combined with clinical observations, patient history, and epidemiological information. The expected result is Negative.  Fact Sheet for Patients:  EntrepreneurPulse.com.au  Fact Sheet for Healthcare Providers:  IncredibleEmployment.be  This test is no t yet approved or cleared by the [place] FDA and  has been authorized for detection and/or diagnosis of SARS-CoV-2 by FDA under an Emergency Use Authorization (EUA). This EUA will remain  in effect (meaning this test can be used) for the duration of the COVID-19 declaration under Section 564(b)(1) of the Act, 21 U.S.C.section 360bbb-3(b)(1), unless the authorization is terminated  or revoked sooner.       Influenza A by PCR NEGATIVE NEGATIVE Final   Influenza B by PCR NEGATIVE NEGATIVE Final    Comment: (NOTE) The Xpert Xpress SARS-CoV-2/FLU/RSV plus assay is intended as an aid in the diagnosis of influenza from Nasopharyngeal swab specimens and should not be used as a sole basis for treatment. Nasal washings and aspirates are unacceptable for Xpert Xpress SARS-CoV-2/FLU/RSV testing.  Fact Sheet for  Patients: EntrepreneurPulse.com.au  Fact Sheet for Healthcare Providers: IncredibleEmployment.be  This test is not yet approved or cleared by the [place] FDA and has been authorized for detection and/or diagnosis of SARS-CoV-2 by FDA under an Emergency Use Authorization (EUA). This EUA will remain in effect (meaning this test can be used) for the duration of the COVID-19 declaration under Section 564(b)(1) of the Act, 21 U.S.C. section 360bbb-3(b)(1), unless the authorization is terminated or revoked.  Performed at [facility], [place] [address], [place], [place] [address]         Radiology Studies: DG Abd 1 View  Result Date: 02/28/2021 CLINICAL DATA:  Abdominal pain. EXAM: ABDOMEN - 1 VIEW COMPARISON:  None. FINDINGS: The bowel gas pattern is normal. No radio-opaque calculi or other significant radiographic abnormality are seen. IMPRESSION: Negative. Electronically Signed   By: Marijo Conception M.D.   On: 02/28/2021 12:53   [place] ASCITES (ABDOMEN LIMITED)  Result Date: 03/01/2021 CLINICAL DATA:  History of cirrhosis, now with abdominal distension. Please perform ascites search ultrasound and ultrasound-guided paracentesis as indicated. EXAM: LIMITED ABDOMEN ULTRASOUND FOR ASCITES TECHNIQUE: Limited ultrasound survey for ascites was performed in all four abdominal quadrants. COMPARISON:  CT abdomen pelvis-02/23/2019 FINDINGS: Sonographic evaluation of the abdomen is negative for any significant intra-abdominal ascites. No paracentesis attempted. IMPRESSION: No significant intra-abdominal ascites.  No paracentesis attempted. Electronically Signed   By: Sandi Mariscal M.D.   On: 03/01/2021 10:09        Scheduled Meds:  diphenhydrAMINE  25 mg Oral Once   enoxaparin (LOVENOX) injection  40 mg Subcutaneous Q24H   lipase/protease/amylase  12,000 Units Oral TID WC   metoprolol succinate  25 mg Oral Daily   pantoprazole  40 mg Oral BID   polyethylene glycol  17 g Oral BID   senna  1 tablet Oral Daily   Continuous Infusions:   LOS: 8 days     Cordelia Poche, MD [facility] 03/02/2021, 9:56 AM  If 7PM-7AM, please contact night-coverage [email or web address]

## 2021-03-02 NOTE — Progress Notes (Signed)
   03/02/21 1200  Mobility  Activity Ambulated in hall;Ambulated in room  Level of Assistance Standby assist, set-up cues, supervision of patient - no hands on  Assistive Device Front wheel walker  Distance Ambulated (ft) 400 ft  Mobility Ambulated with assistance in hallway;Ambulated independently in room  Mobility Response Tolerated well  Mobility performed by Mobility specialist  $Mobility charge 1 Mobility   Pt agreeable to mobilize this morning. Prior to session, pt requested to use the bathroom. She independently walked to and from the bathroom. Pt ambulated in the hall about 441ft with RW, tolerated well. She stated her pain was about 6/10 today in her abdomen while ambulating. Otherwise no complaints. Upon returning, pt requested for an [place] ice. Left pt in chair with [place] ice and call bell at side. RN notified of session.    [place] Specialist Acute Rehab Services Office: [phone]

## 2021-03-03 ENCOUNTER — Encounter (HOSPITAL_COMMUNITY): Payer: Self-pay | Admitting: Internal Medicine

## 2021-03-03 ENCOUNTER — Inpatient Hospital Stay (HOSPITAL_COMMUNITY): Payer: Medicare Other

## 2021-03-03 DIAGNOSIS — R109 Unspecified abdominal pain: Secondary | ICD-10-CM

## 2021-03-03 LAB — LIPASE, BLOOD: Lipase: 50 U/L (ref 11–51)

## 2021-03-03 MED ORDER — IOHEXOL 9 MG/ML PO SOLN
ORAL | Status: AC
  Administered 2021-03-03: 500 mL
  Filled 2021-03-03: qty 1000

## 2021-03-03 MED ORDER — SODIUM CHLORIDE 0.9 % IV SOLN: INTRAVENOUS | Status: AC

## 2021-03-03 MED ORDER — IOHEXOL 350 MG/ML SOLN
100.0000 mL | Freq: Once | INTRAVENOUS | Status: AC | PRN
  Administered 2021-03-03: 100 mL via INTRAVENOUS

## 2021-03-03 MED ORDER — IOHEXOL 9 MG/ML PO SOLN
500.0000 mL | ORAL | Status: AC
  Administered 2021-03-03 (×2): 500 mL via ORAL

## 2021-03-03 NOTE — Progress Notes (Addendum)
Progress Note    Connie [name]  YJE:[id number] DOB: 1956-04-16  DOA: 02/21/2021 PCP: Arthur Holms, NP      Brief Narrative:    Medical records reviewed and are as summarized below:  Connie [name] is a 65 y.o. female with medical history significant for alcoholic liver cirrhosis, CKD stage IIIa, tobacco use disorder, who presented to the hospital with anorexia, nausea, vomiting and epigastric pain.  She was found to have acute pancreatitis.     Assessment/Plan:   Principal Problem:   Acute pancreatitis Active Problems:   Alcoholic cirrhosis of liver with ascites (HCC)   Dehydration   Fever   Hypokalemia   CKD (chronic kidney disease) stage 3, GFR 30-59 ml/min (HCC)   Acute constipation   Abdominal pain    Body mass index is 35.74 kg/m.  (Morbid obesity)  Acute pancreatitis, severe abdominal pain: Repeat CT abdomen pelvis with contrast.  Continue analgesics as needed.  Antiemetics as needed.  Start IV fluids for hydration.  Repeat lipase was normal.  Presumed UTI: Completed 3 days of Keflex  Alcoholic liver cirrhosis with ascites, history of hepatitis C: Outpatient follow-up with hepatologist  AKI on CKD stage III: Improved  Paroxysmal atrial fibrillation: Continue metoprolol  Diet Order             DIET SOFT Room service appropriate? Yes; Fluid consistency: Thin  Diet effective now                      Consultants: General surgeon  Procedures: None    Medications:    diphenhydrAMINE  25 mg Oral Once   enoxaparin (LOVENOX) injection  40 mg Subcutaneous Q24H   lipase/protease/amylase  12,000 Units Oral TID WC   metoprolol succinate  25 mg Oral Daily   pantoprazole  40 mg Oral BID   polyethylene glycol  17 g Oral BID   senna  1 tablet Oral Daily   Continuous Infusions:   Anti-infectives (From admission, onward)    Start     Dose/Rate Route Frequency Ordered Stop   02/25/21 1400  cephALEXin (KEFLEX) capsule 500 mg        500 mg  Oral Every 8 hours 02/25/21 1142 02/28/21 0549              Family Communication/Anticipated D/C date and plan/Code Status   DVT prophylaxis: enoxaparin (LOVENOX) injection 40 mg Start: 02/23/21 1000     Code Status: Full Code  Family Communication: None Disposition Plan: Discharge to Home when medically ready   Status is: Inpatient  Remains inpatient appropriate because: Severe abdominal pain           Subjective:   C/o severe abdominal pain worse with little movement in the bed.  She is only able to drink water.  Cranberry juice or apple juice exacerbates the pain.  No vomiting or diarrhea  Objective:    Vitals:   03/02/21 1353 03/02/21 1947 03/03/21 0131 03/03/21 0348  BP: 139/85 (!) 150/81  (!) 132/57  Pulse: 63 62  60  Resp: 20 20  20   Temp: 97.9 F (36.6 C) 99.2 F (37.3 C)  98.9 F (37.2 C)  TempSrc: Oral Oral  Oral  SpO2: 98% 100%  95%  Weight:  103.5 kg 103.5 kg   Height:       No data found.   Intake/Output Summary (Last 24 hours) at 03/03/2021 0924 Last data filed at 03/02/2021 1700 Gross per 24 hour  Intake  480 ml  Output 1 ml  Net 479 ml   Filed Weights   03/02/21 0359 03/02/21 1947 03/03/21 0131  Weight: 103.8 kg 103.5 kg 103.5 kg    Exam:  GEN: NAD SKIN: No rash EYES: EOMI ENT: MMM CV: RRR PULM: CTA B ABD: soft, ND, tenderness in epigastrium , periumbilical, right side of mid and lower abdomen, no rebound tenderness or guarding, +BS CNS: AAO x 3, non focal EXT: No edema or tenderness         Data Reviewed:   I have personally reviewed following labs and imaging studies:  Labs: Labs show the following:   Basic Metabolic Panel: Recent Labs  Lab 02/25/21 0435 02/27/21 0531 03/02/21 0456 03/02/21 1027  NA 134* 135  --  134*  K 3.8 3.6  --  3.9  CL 103 102  --  101  CO2 24 28  --  29  GLUCOSE 112* 106*  --  96  BUN 8 8  --  10  CREATININE 1.08* 1.07* 1.05* 1.04*  CALCIUM 8.4* 8.7*  --  9.3    GFR Estimated Creatinine Clearance: 66.7 mL/min (A) (by C-G formula based on SCr of 1.04 mg/dL (H)). Liver Function Tests: Recent Labs  Lab 03/02/21 1027  AST 52*  ALT 34  ALKPHOS 72  BILITOT 1.1  PROT 7.4  ALBUMIN 2.5*   No results for input(s): LIPASE, AMYLASE in the last 168 hours. No results for input(s): AMMONIA in the last 168 hours. Coagulation profile No results for input(s): INR, PROTIME in the last 168 hours.  CBC: Recent Labs  Lab 02/25/21 0435 02/27/21 0531 03/02/21 1027  WBC 9.1 9.8 10.7*  HGB 11.2* 11.8* 12.5  HCT 34.9* 35.6* 38.0  MCV 103.3* 100.8* 100.0  PLT 119* 152 212   Cardiac Enzymes: No results for input(s): CKTOTAL, CKMB, CKMBINDEX, TROPONINI in the last 168 hours. BNP (last 3 results) No results for input(s): PROBNP in the last 8760 hours. CBG: No results for input(s): GLUCAP in the last 168 hours. D-Dimer: No results for input(s): DDIMER in the last 72 hours. Hgb A1c: No results for input(s): HGBA1C in the last 72 hours. Lipid Profile: No results for input(s): CHOL, HDL, LDLCALC, TRIG, CHOLHDL, LDLDIRECT in the last 72 hours. Thyroid function studies: No results for input(s): TSH, T4TOTAL, T3FREE, THYROIDAB in the last 72 hours.  Invalid input(s): FREET3 Anemia work up: No results for input(s): VITAMINB12, FOLATE, FERRITIN, TIBC, IRON, RETICCTPCT in the last 72 hours. Sepsis Labs: Recent Labs  Lab 02/25/21 0435 02/27/21 0531 03/02/21 1027  WBC 9.1 9.8 10.7*    Microbiology Recent Results (from the past 240 hour(s))  Resp Panel by RT-PCR (Flu A&B, Covid) Nasopharyngeal Swab     Status: None   Collection Time: 02/22/21  3:41 AM   Specimen: Nasopharyngeal Swab; Nasopharyngeal(NP) swabs in vial transport medium  Result Value Ref Range Status   SARS Coronavirus 2 by RT PCR NEGATIVE NEGATIVE Final    Comment: (NOTE) SARS-CoV-2 target nucleic acids are NOT DETECTED.  The SARS-CoV-2 RNA is generally detectable in upper  respiratory specimens during the acute phase of infection. The lowest concentration of SARS-CoV-2 viral copies this assay can detect is 138 copies/mL. A negative result does not preclude SARS-Cov-2 infection and should not be used as the sole basis for treatment or other patient management decisions. A negative result may occur with  improper specimen collection/handling, submission of specimen other than nasopharyngeal swab, presence of viral mutation(s) within the areas targeted  by this assay, and inadequate number of viral copies(<138 copies/mL). A negative result must be combined with clinical observations, patient history, and epidemiological information. The expected result is Negative.  Fact Sheet for Patients:  EntrepreneurPulse.com.au  Fact Sheet for Healthcare Providers:  IncredibleEmployment.be  This test is no t yet approved or cleared by the [place] FDA and  has been authorized for detection and/or diagnosis of SARS-CoV-2 by FDA under an Emergency Use Authorization (EUA). This EUA will remain  in effect (meaning this test can be used) for the duration of the COVID-19 declaration under Section 564(b)(1) of the Act, 21 U.S.C.section 360bbb-3(b)(1), unless the authorization is terminated  or revoked sooner.       Influenza A by PCR NEGATIVE NEGATIVE Final   Influenza B by PCR NEGATIVE NEGATIVE Final    Comment: (NOTE) The Xpert Xpress SARS-CoV-2/FLU/RSV plus assay is intended as an aid in the diagnosis of influenza from Nasopharyngeal swab specimens and should not be used as a sole basis for treatment. Nasal washings and aspirates are unacceptable for Xpert Xpress SARS-CoV-2/FLU/RSV testing.  Fact Sheet for Patients: EntrepreneurPulse.com.au  Fact Sheet for Healthcare Providers: IncredibleEmployment.be  This test is not yet approved or cleared by the [place] FDA and has been  authorized for detection and/or diagnosis of SARS-CoV-2 by FDA under an Emergency Use Authorization (EUA). This EUA will remain in effect (meaning this test can be used) for the duration of the COVID-19 declaration under Section 564(b)(1) of the Act, 21 U.S.C. section 360bbb-3(b)(1), unless the authorization is terminated or revoked.  Performed at [facility], [place] [address] [place], [place] [address]     Procedures and diagnostic studies:  [place] ASCITES (ABDOMEN LIMITED)  Result Date: 03/01/2021 CLINICAL DATA:  History of cirrhosis, now with abdominal distension. Please perform ascites search ultrasound and ultrasound-guided paracentesis as indicated. EXAM: LIMITED ABDOMEN ULTRASOUND FOR ASCITES TECHNIQUE: Limited ultrasound survey for ascites was performed in all four abdominal quadrants. COMPARISON:  CT abdomen pelvis-02/23/2019 FINDINGS: Sonographic evaluation of the abdomen is negative for any significant intra-abdominal ascites. No paracentesis attempted. IMPRESSION: No significant intra-abdominal ascites.  No paracentesis attempted. Electronically Signed   By: Sandi Mariscal M.D.   On: 03/01/2021 10:09               LOS: 9 days   Levar Fayson  [facility]   Pager on [email or web address]. If 7PM-7AM, please contact night-coverage at [email or web address]     03/03/2021, 9:24 AM

## 2021-03-04 ENCOUNTER — Inpatient Hospital Stay (HOSPITAL_COMMUNITY): Payer: Medicare Other

## 2021-03-04 DIAGNOSIS — K863 Pseudocyst of pancreas: Secondary | ICD-10-CM | POA: Diagnosis present

## 2021-03-04 HISTORY — DX: Pseudocyst of pancreas: K86.3

## 2021-03-04 LAB — BASIC METABOLIC PANEL
Anion gap: 9 (ref 5–15)
BUN: 9 mg/dL (ref 8–23)
CO2: 25 mmol/L (ref 22–32)
Calcium: 9 mg/dL (ref 8.9–10.3)
Chloride: 100 mmol/L (ref 98–111)
Creatinine, Ser: 1.12 mg/dL — ABNORMAL HIGH (ref 0.44–1.00)
GFR, Estimated: 55 mL/min — ABNORMAL LOW (ref >60)
Glucose, Bld: 100 mg/dL — ABNORMAL HIGH (ref 70–99)
Potassium: 3.8 mmol/L (ref 3.5–5.1)
Sodium: 134 mmol/L — ABNORMAL LOW (ref 135–145)

## 2021-03-04 LAB — CBC WITH DIFFERENTIAL/PLATELET
Abs Immature Granulocytes: 0.25 10*3/uL — ABNORMAL HIGH (ref 0.00–0.07)
Basophils Absolute: 0.1 10*3/uL (ref 0.0–0.1)
Basophils Relative: 1 %
Eosinophils Absolute: 0.3 10*3/uL (ref 0.0–0.5)
Eosinophils Relative: 4 %
HCT: 36.5 % (ref 36.0–46.0)
Hemoglobin: 11.9 g/dL — ABNORMAL LOW (ref 12.0–15.0)
Immature Granulocytes: 3 %
Lymphocytes Relative: 18 %
Lymphs Abs: 1.7 10*3/uL (ref 0.7–4.0)
MCH: 32.6 pg (ref 26.0–34.0)
MCHC: 32.6 g/dL (ref 30.0–36.0)
MCV: 100 fL (ref 80.0–100.0)
Monocytes Absolute: 1 10*3/uL (ref 0.1–1.0)
Monocytes Relative: 11 %
Neutro Abs: 6.3 10*3/uL (ref 1.7–7.7)
Neutrophils Relative %: 63 %
Platelets: 231 10*3/uL (ref 150–400)
RBC: 3.65 MIL/uL — ABNORMAL LOW (ref 3.87–5.11)
RDW: 14.6 % (ref 11.5–15.5)
WBC: 9.6 10*3/uL (ref 4.0–10.5)
nRBC: 0 % (ref 0.0–0.2)

## 2021-03-04 LAB — MAGNESIUM: Magnesium: 2.2 mg/dL (ref 1.7–2.4)

## 2021-03-04 MED ORDER — GADOBUTROL 1 MMOL/ML IV SOLN
10.0000 mL | Freq: Once | INTRAVENOUS | Status: AC | PRN
  Administered 2021-03-04: 10 mL via INTRAVENOUS

## 2021-03-04 NOTE — Care Management Important Message (Signed)
Important Message  Patient Details IM Letter given to the Patient. Name: Mayu Ronk MRN: [id number] Date of Birth: [birth year]   Medicare Important Message Given:  Yes     Kerin Salen 03/04/2021, 1:46 PM

## 2021-03-04 NOTE — Progress Notes (Signed)
Progress Note    Emie Sommerfeld  CVE:[id number] DOB: [birth year]  DOA: 02/21/2021 PCP: Arthur Holms, NP      Brief Narrative:    Medical records reviewed and are as summarized below:  Connie [name] is a 65 y.o. female with medical history significant for alcoholic liver cirrhosis, CKD stage IIIa, tobacco use disorder, who presented to the hospital with anorexia, nausea, vomiting and epigastric pain.  She was found to have acute pancreatitis.     Assessment/Plan:   Principal Problem:   Acute pancreatitis Active Problems:   Alcoholic cirrhosis of liver with ascites (HCC)   Dehydration   Fever   Hypokalemia   CKD (chronic kidney disease) stage 3, GFR 30-59 ml/min (HCC)   Acute constipation   Abdominal pain   Pancreatic pseudocyst    Body mass index is 35.43 kg/m.  (Morbid obesity)  Acute pancreatitis, severe abdominal pain: CT abdomen pelvis and MRI abdomen reviewed.  Findings showed pancreatic pseudocyst and suspected hepatocellular carcinoma.  Consulted gastroenterologist for further evaluation.  AFP and CA 19-9 markers are pending.  Continue analgesics as needed.   Presumed UTI: Completed 3 days of Keflex  Alcoholic liver cirrhosis with ascites, history of hepatitis C: Outpatient follow-up with hepatologist  AKI on CKD stage III: Improved  Paroxysmal atrial fibrillation: Continue metoprolol  Diet Order             Diet clear liquid Room service appropriate? Yes; Fluid consistency: Thin  Diet effective now                      Consultants: General surgeon  Procedures: None    Medications:    diphenhydrAMINE  25 mg Oral Once   enoxaparin (LOVENOX) injection  40 mg Subcutaneous Q24H   lipase/protease/amylase  12,000 Units Oral TID WC   metoprolol succinate  25 mg Oral Daily   pantoprazole  40 mg Oral BID   polyethylene glycol  17 g Oral BID   senna  1 tablet Oral Daily   Continuous Infusions:   Anti-infectives (From admission,  onward)    Start     Dose/Rate Route Frequency Ordered Stop   02/25/21 1400  cephALEXin (KEFLEX) capsule 500 mg        500 mg Oral Every 8 hours 02/25/21 1142 02/28/21 0549              Family Communication/Anticipated D/C date and plan/Code Status   DVT prophylaxis: enoxaparin (LOVENOX) injection 40 mg Start: 02/23/21 1000     Code Status: Full Code  Family Communication: None Disposition Plan: Discharge to Home when medically ready   Status is: Inpatient  Remains inpatient appropriate because: Severe abdominal pain           Subjective:   C/o abdominal pain but pain is a little better today.  When she is laying still, she does not feel the pain much but a little movement exacerbates the abdominal pain.  While you are  Objective:    Vitals:   03/04/21 0500 03/04/21 0511 03/04/21 1030 03/04/21 1336  BP:  134/64 (!) 141/64 125/67  Pulse:  64 81 67  Resp:  20 18 14   Temp:  98.3 F (36.8 C) 98.2 F (36.8 C) 98 F (36.7 C)  TempSrc:  Oral Oral Oral  SpO2:  99% 96% 96%  Weight: 102.6 kg     Height:       No data found.   Intake/Output Summary (Last 24 hours)  at 03/04/2021 1542 Last data filed at 03/03/2021 1805 Gross per 24 hour  Intake 0 ml  Output --  Net 0 ml   Filed Weights   03/02/21 1947 03/03/21 0131 03/04/21 0500  Weight: 103.5 kg 103.5 kg 102.6 kg    Exam:  GEN: NAD SKIN: No rash on exposed skin EYES: No pallor or icterus ENT: MMM CV: RRR PULM: CTA B ABD: soft, ND, NT, tenderness and mid abdominal area, no rebound tenderness or guarding, +BS CNS: AAO x 3, non focal EXT: No edema or tenderness          Data Reviewed:   I have personally reviewed following labs and imaging studies:  Labs: Labs show the following:   Basic Metabolic Panel: Recent Labs  Lab 02/27/21 0531 03/02/21 0456 03/02/21 1027 03/04/21 0452  NA 135  --  134* 134*  K 3.6  --  3.9 3.8  CL 102  --  101 100  CO2 28  --  29 25  GLUCOSE 106*   --  96 100*  BUN 8  --  10 9  CREATININE 1.07* 1.05* 1.04* 1.12*  CALCIUM 8.7*  --  9.3 9.0  MG  --   --   --  2.2   GFR Estimated Creatinine Clearance: 61.7 mL/min (A) (by C-G formula based on SCr of 1.12 mg/dL (H)). Liver Function Tests: Recent Labs  Lab 03/02/21 1027  AST 52*  ALT 34  ALKPHOS 72  BILITOT 1.1  PROT 7.4  ALBUMIN 2.5*   Recent Labs  Lab 03/03/21 0938  LIPASE 50   No results for input(s): AMMONIA in the last 168 hours. Coagulation profile No results for input(s): INR, PROTIME in the last 168 hours.  CBC: Recent Labs  Lab 02/27/21 0531 03/02/21 1027 03/04/21 0452  WBC 9.8 10.7* 9.6  NEUTROABS  --   --  6.3  HGB 11.8* 12.5 11.9*  HCT 35.6* 38.0 36.5  MCV 100.8* 100.0 100.0  PLT 152 212 231   Cardiac Enzymes: No results for input(s): CKTOTAL, CKMB, CKMBINDEX, TROPONINI in the last 168 hours. BNP (last 3 results) No results for input(s): PROBNP in the last 8760 hours. CBG: No results for input(s): GLUCAP in the last 168 hours. D-Dimer: No results for input(s): DDIMER in the last 72 hours. Hgb A1c: No results for input(s): HGBA1C in the last 72 hours. Lipid Profile: No results for input(s): CHOL, HDL, LDLCALC, TRIG, CHOLHDL, LDLDIRECT in the last 72 hours. Thyroid function studies: No results for input(s): TSH, T4TOTAL, T3FREE, THYROIDAB in the last 72 hours.  Invalid input(s): FREET3 Anemia work up: No results for input(s): VITAMINB12, FOLATE, FERRITIN, TIBC, IRON, RETICCTPCT in the last 72 hours. Sepsis Labs: Recent Labs  Lab 02/27/21 0531 03/02/21 1027 03/04/21 0452  WBC 9.8 10.7* 9.6    Microbiology No results found for this or any previous visit (from the past 240 hour(s)).   Procedures and diagnostic studies:  CT ABDOMEN PELVIS W CONTRAST  Result Date: 03/03/2021 CLINICAL DATA:  Pancreatitis, worsening abdominal pain. EXAM: CT ABDOMEN AND PELVIS WITH CONTRAST TECHNIQUE: Multidetector CT imaging of the abdomen and pelvis was  performed using the standard protocol following bolus administration of intravenous contrast. CONTRAST:  122mL OMNIPAQUE IOHEXOL 350 MG/ML SOLN COMPARISON:  02/22/2021 FINDINGS: Lower chest: Again noted is a 3 mm nodule in the lingula on sequence 4 image 2. No pleural effusions. Subpleural and reticular densities in right lower lobe could represent chronic changes. Hepatobiliary: Enlarged left hepatic lobe. Liver  has a slightly nodular contour and compatible with cirrhosis. There is concern for multiple enhancing lesions in the right hepatic lobe, largest measures 6.3 x 4.1 x 5.0 cm on sequence 2 image 24. These lesions were not clearly identified on the previous examination but may be related to the timing of the study. Findings are concerning for neoplastic lesions in the liver. The main portal venous system is patent. No gross abnormality to the gallbladder. Pancreas: There is enhancement throughout the pancreas without duct dilatation. Again noted is low-density fluid or material along the caudal aspect of the pancreatic body and tail. Spleen: Normal in size without focal abnormality. Adrenals/Urinary Tract: Normal adrenal glands. Normal appearance of the urinary bladder. Normal appearance of both kidneys without hydronephrosis. No suspicious renal lesions. Stomach/Bowel: Normal appearance of stomach without distension. No evidence for bowel distention or obstruction. Oral contrast in the distal colon. No acute bowel inflammation. Vascular/Lymphatic: Atherosclerotic calcifications in the abdominal aorta without aneurysm. Evidence for varices around the distal esophagus. Evidence for a portosystemic shunt associated with varices on the left side of the abdomen that are draining into the IVC on series 2, image 49. There is no significant gastrorenal shunt identified. Reproductive: Uterus appears to be retroverted or retroflexed. No evidence for an adnexal mass. Other: The inflammatory changes in the upper abdomen  around the pancreas have organized into a more distinct fluid collection along the right anterior abdomen on series 2, image 39. This is compatible with a large developing pseudocyst that measures approximately 14.1 x 5.6 x 10.9 cm. There is no evidence for gas within this developing pseudocyst. Umbilical hernia containing fat. No evidence for free fluid or ascites. Musculoskeletal: No acute bone abnormality. IMPRESSION: 1. Multiple suspicious lesions in the right hepatic lobe. Patient has underlying cirrhosis and findings are concerning for multifocal hepatocellular carcinoma. Metastatic disease is also in the differential diagnosis. Recommend MRI of the abdomen with and without contrast. If the patient cannot tolerate a MRI, consider a multiphase liver CT. 2. Increased inflammatory changes in the upper abdomen compatible with pancreatitis and a large developing pseudocyst. This is developing pseudocyst measures up to 14 cm in size. 3. Cirrhosis with evidence of portal hypertension demonstrated by esophageal varices and varices draining into the IVC. 4. Indeterminate 3 mm pulmonary nodule in the lingula. Management of this pulmonary nodule depends on the etiology of the liver lesions. These results will be called to the ordering clinician or representative by the Radiologist Assistant, and communication documented in the PACS or [employer]. Electronically Signed   By: Markus Daft M.D.   On: 03/03/2021 17:59   MR 3D Recon At Scanner  Result Date: 03/04/2021 CLINICAL DATA:  65 year old female with history of multiple hepatic lesions noted on recent CT examination. MRI for further characterization. EXAM: MRI ABDOMEN WITHOUT AND WITH CONTRAST (INCLUDING MRCP) TECHNIQUE: Multiplanar multisequence MR imaging of the abdomen was performed both before and after the administration of intravenous contrast. Heavily T2-weighted images of the biliary and pancreatic ducts were obtained, and three-dimensional MRCP images  were rendered by post processing. CONTRAST:  75mL GADAVIST GADOBUTROL 1 MMOL/ML IV SOLN COMPARISON:  No prior abdominal MRI. CT the abdomen and pelvis 03/03/2021. FINDINGS: Lower chest: Unremarkable. Hepatobiliary: Liver has a shrunken appearance and nodular contour, indicative of underlying cirrhosis. Again noted are multiple enhancing hepatic lesions in the right lobe of the liver. The largest of these is in the central aspect of segments 5 and 6 (axial image 39 of series 21 and  coronal image 21 of series 32) measuring approximately 5.8 x 3.6 x 5.6 cm. This lesion demonstrates relatively uniform arterial phase hyperenhancement, with exception of a central hypoenhancing area. This enhancement persists on delayed imaging where the lesion becomes nearly indiscernible from the adjacent normal liver (though clearly still enhancing on subtraction imaging). No definite pseudocapsule identified on delayed imaging. Some enhancement of that central hypoenhancing area is evident on more delayed imaging. This lesion demonstrates diffusion restriction on diffusion-weighted imaging. Numerous satellite smaller lesions are also noted with similar arterial phase hyperenhancement which is more uniform in many of these lesions, with persistent enhancement on delayed imaging in several of the lesions, which are slightly above background hepatic enhancement. None of these clearly demonstrate pseudo capsule. The largest of the satellite lesions include a bilobed lesion in segment 5 (axial image 52 of series 21) measuring 2.6 x 1.7 cm, and a 1.6 x 1.2 cm lesion in segment 6 (axial image 46 of series 21). No intra or extrahepatic biliary ductal dilatation. Amorphous material lying dependently in the gallbladder which is mildly T1 hyperintense and T2 hypointense, compatible with biliary sludge. No gallbladder wall thickening. No definite pericholecystic fluid or surrounding inflammatory changes. No filling defect in the common bile duct  to suggest choledocholithiasis. Common bile duct measures 3 mm in the porta hepatis. Pancreas: Extensive peripancreatic inflammatory changes are again noted, most of which extend into the root of the small bowel mesentery where there is a complex inflammatory mass of heterogeneous signal intensity on T1 and T2 weighted images, most compatible with an infiltrative pseudocyst in the mesentery. No definite pancreatic mass. No pancreatic ductal dilatation. Spleen:  Unremarkable. Adrenals/Urinary Tract: Subcentimeter T1 hypointense, T2 hyperintense, nonenhancing lesions in the kidneys bilaterally, compatible with tiny simple cysts. No aggressive appearing renal lesions. No hydroureteronephrosis in the visualized portions of the abdomen. Stomach/Bowel: Large infiltrative pancreatic pseudocyst in the small bowel mesentery. Visualized portions of stomach, small bowel and colon are otherwise unremarkable. Vascular/Lymphatic: No aneurysm identified in the visualized abdominal vasculature. Numerous venous collaterals are noted in the mesentery and lower retroperitoneum on the left. No definite lymphadenopathy confidently identified in the abdomen. Other:  Trace amount of perihepatic ascites. Musculoskeletal: No aggressive appearing osseous lesions are noted in the visualized portions of the skeleton. IMPRESSION: 1. Multiple enhancing lesions are noted throughout the right lobe of the liver, as detailed above. These have indeterminate imaging characteristics. Given the background of hepatic cirrhosis, the possibility of multifocal infiltrative hepatocellular carcinoma should be considered, although none of these lesions meet definite imaging criteria to establish that diagnosis. Correlation with AFP level is recommended. Other differential considerations include malignant and benign etiologies such as fibrolamellar hepatocellular carcinoma and multifocal focal nodular hyperplasia ([place]). Consideration for repeat abdominal MRI  with and without IV gadolinium (Eovist) is also suggested, although biopsy may ultimately be required to establish a tissue diagnosis. 2. 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 3. Trace volume of perihepatic ascites. Electronically Signed   By: Vinnie Langton M.D.   On: 03/04/2021 08:52   MR ABDOMEN MRCP W WO CONTAST  Result Date: 03/04/2021 CLINICAL DATA:  65 year old female with history of multiple hepatic lesions noted on recent CT examination. MRI for further characterization. EXAM: MRI ABDOMEN WITHOUT AND WITH CONTRAST (INCLUDING MRCP) TECHNIQUE: Multiplanar multisequence MR imaging of the abdomen was performed both before and after the administration of intravenous contrast. Heavily T2-weighted images of the biliary and pancreatic ducts were obtained, and three-dimensional  MRCP images were rendered by post processing. CONTRAST:  56mL GADAVIST GADOBUTROL 1 MMOL/ML IV SOLN COMPARISON:  No prior abdominal MRI. CT the abdomen and pelvis 03/03/2021. FINDINGS: Lower chest: Unremarkable. Hepatobiliary: Liver has a shrunken appearance and nodular contour, indicative of underlying cirrhosis. Again noted are multiple enhancing hepatic lesions in the right lobe of the liver. The largest of these is in the central aspect of segments 5 and 6 (axial image 39 of series 21 and coronal image 21 of series 32) measuring approximately 5.8 x 3.6 x 5.6 cm. This lesion demonstrates relatively uniform arterial phase hyperenhancement, with exception of a central hypoenhancing area. This enhancement persists on delayed imaging where the lesion becomes nearly indiscernible from the adjacent normal liver (though clearly still enhancing on subtraction imaging). No definite pseudocapsule identified on delayed imaging. Some enhancement of that central hypoenhancing area is evident on more delayed imaging. This lesion demonstrates diffusion restriction on diffusion-weighted  imaging. Numerous satellite smaller lesions are also noted with similar arterial phase hyperenhancement which is more uniform in many of these lesions, with persistent enhancement on delayed imaging in several of the lesions, which are slightly above background hepatic enhancement. None of these clearly demonstrate pseudo capsule. The largest of the satellite lesions include a bilobed lesion in segment 5 (axial image 52 of series 21) measuring 2.6 x 1.7 cm, and a 1.6 x 1.2 cm lesion in segment 6 (axial image 46 of series 21). No intra or extrahepatic biliary ductal dilatation. Amorphous material lying dependently in the gallbladder which is mildly T1 hyperintense and T2 hypointense, compatible with biliary sludge. No gallbladder wall thickening. No definite pericholecystic fluid or surrounding inflammatory changes. No filling defect in the common bile duct to suggest choledocholithiasis. Common bile duct measures 3 mm in the porta hepatis. Pancreas: Extensive peripancreatic inflammatory changes are again noted, most of which extend into the root of the small bowel mesentery where there is a complex inflammatory mass of heterogeneous signal intensity on T1 and T2 weighted images, most compatible with an infiltrative pseudocyst in the mesentery. No definite pancreatic mass. No pancreatic ductal dilatation. Spleen:  Unremarkable. Adrenals/Urinary Tract: Subcentimeter T1 hypointense, T2 hyperintense, nonenhancing lesions in the kidneys bilaterally, compatible with tiny simple cysts. No aggressive appearing renal lesions. No hydroureteronephrosis in the visualized portions of the abdomen. Stomach/Bowel: Large infiltrative pancreatic pseudocyst in the small bowel mesentery. Visualized portions of stomach, small bowel and colon are otherwise unremarkable. Vascular/Lymphatic: No aneurysm identified in the visualized abdominal vasculature. Numerous venous collaterals are noted in the mesentery and lower retroperitoneum on  the left. No definite lymphadenopathy confidently identified in the abdomen. Other:  Trace amount of perihepatic ascites. Musculoskeletal: No aggressive appearing osseous lesions are noted in the visualized portions of the skeleton. IMPRESSION: 1. Multiple enhancing lesions are noted throughout the right lobe of the liver, as detailed above. These have indeterminate imaging characteristics. Given the background of hepatic cirrhosis, the possibility of multifocal infiltrative hepatocellular carcinoma should be considered, although none of these lesions meet definite imaging criteria to establish that diagnosis. Correlation with AFP level is recommended. Other differential considerations include malignant and benign etiologies such as fibrolamellar hepatocellular carcinoma and multifocal focal nodular hyperplasia ([place]). Consideration for repeat abdominal MRI with and without IV gadolinium (Eovist) is also suggested, although biopsy may ultimately be required to establish a tissue diagnosis. 2. 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 3. Trace volume of perihepatic ascites. Electronically Signed   By: Quillian Quince  Entrikin M.D.   On: 03/04/2021 08:52               LOS: 10 days   [facility]   Pager on [email or web address]. If 7PM-7AM, please contact night-coverage at [email or web address]     03/04/2021, 3:42 PM

## 2021-03-04 NOTE — Consult Note (Signed)
Referring Provider: [place] PA-C Primary Care Physician:  Arthur Holms, NP Primary Gastroenterologist:  Althia Forts follows with [facility]  Reason for Consultation:  Alcoholic cirrhosis with acute pancreatitis   HPI: Connie [name] is a 65 y.o. female with history of stage IIIb chronic kidney disease, gastritis, chronic tobacco abuse, paroxysmal atrial fibrillation significant history for alcohol cirrhosis with history of hepatitis C, following with [facility] clinic presents with epigastric pain, nausea.  Patient recently saw [facility] 10 03/2021. Endoscopy 08/19/2019 showed no changes associate with portal hypertension.  Due for endoscopy 07/2020. Abdominal ultrasound April 2021 had indeterminate 2.5 cm lesion Appears patient did not follow up.   Patient was admitted 10/24 where she presented to the ER with temperature 100.6, total bilirubin 3.3 (2.7 on 08/04/19), lipase 213, INR 1.7, white blood cell count 15.4. CT showed inflammatory changes of pancreas consistent with acute pancreatitis without evidence of necrosis, abscess or pseudocyst.  No evidence of gallbladder wall thickening, pericholecystic fluid, choledocholithiasis, common bile duct dilatation.  Did show cirrhosis with small ascites. Abdominal ultrasound did show gallbladder sludge without evidence of overt stones.  States patient woke up with abdominal pain, nausea vomiting.  Never had anything like this before. Family history of pancreatitis. Denies any recent alcohol use, quit 2 years ago, patient does smoke daily.  She denies any history of injectable or intranasal drug use, high risk sexual behavior, or Armed forces logistics/support/administrative officer.  Was admitted with hospitalist team and has been treated for acute pancreatitis. She had an MRCP today that showed multiple enhancing lesions, indeterminate, concerning for hepatocellular carcinoma.  Also showed large pancreatic pseudocyst, 14 cm on CT. Currently  patient denies fever, chills. Patient was eating eggs in the room, states has decreased appetite. Has baseline shortness of breath.   Having small bowel movements, passing gas, last 1 yesterday. Denies melena, hematochezia. Patient has been walking the halls.  LIPASE 50 WBC 9.6 HGB 11.9 MCV 100.0 Platelets 231 BUN 9 Cr 1.12  GFR 55  AST 52 ALT 34 Alkphos 72 TBili 1.1 Calcium 9.0 Glucose 100 Potassium 3.8  Magnesium 2.2  Triglycerides 104  MRCP 03/04/2021 IMPRESSION: 1. Multiple enhancing lesions are noted throughout the right lobe of the liver, as detailed above. These have indeterminate imaging characteristics. Given the background of hepatic cirrhosis, the possibility of multifocal infiltrative hepatocellular carcinoma should be considered, although none of these lesions meet definite imaging criteria to establish that diagnosis. Correlation with AFP level is recommended. Other differential considerations include malignant and benign etiologies such as fibrolamellar hepatocellular carcinoma and multifocal focal nodular hyperplasia ([place]). Consideration for repeat abdominal MRI with and without IV gadolinium (Eovist) is also suggested, although biopsy may ultimately be required to establish a tissue diagnosis. 2. 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 3. Trace volume of perihepatic ascites    Past Medical History:  Diagnosis Date   Alcohol abuse    Helicobacter pylori gastritis 08/07/2019   Hepatitis C antibody positive in blood - negative RNA    Hypertension     Past Surgical History:  Procedure Laterality Date   BIOPSY  08/03/2019   Procedure: BIOPSY;  Surgeon: Gatha Mayer, MD;  Location: [facility] ENDOSCOPY;  Service: Endoscopy;;   ESOPHAGOGASTRODUODENOSCOPY (EGD) WITH PROPOFOL N/A 08/03/2019   Procedure: ESOPHAGOGASTRODUODENOSCOPY (EGD) WITH PROPOFOL;  Surgeon: Gatha Mayer, MD;  Location[facility] ENDOSCOPY;   Service: Endoscopy;  Laterality: N/A;    Prior to Admission medications   Medication Sig  Start Date End Date Taking? Authorizing Provider  acetaminophen (TYLENOL) 500 MG tablet Take 1,000 mg by mouth every 6 (six) hours as needed for mild pain.   Yes [provider]  calcium carbonate (TUMS - DOSED IN MG ELEMENTAL CALCIUM) 500 MG chewable tablet Chew 1 tablet by mouth daily as needed for indigestion or heartburn.   Yes [provider]  co-enzyme Q-10 30 MG capsule Take 30 mg by mouth 3 (three) times a week.   Yes [provider]  diclofenac Sodium (VOLTAREN) 1 % GEL Apply 2 g topically 2 (two) times daily as needed (pain).   Yes [provider]  metoprolol succinate (TOPROL-XL) 25 MG 24 hr tablet Take 1 tablet (25 mg total) by mouth daily. 08/04/19  Yes Earlene Plater, MD  Omega-3 Fatty Acids (FISH OIL) 1000 MG CAPS Take 1,000 mg by mouth daily.   Yes [provider]  pantoprazole (PROTONIX) 40 MG tablet Take 1 tablet (40 mg total) by mouth 2 (two) times daily. Patient taking differently: Take 40 mg by mouth daily. 08/04/19 02/21/21 Yes Earlene Plater, MD    Scheduled Meds:  diphenhydrAMINE  25 mg Oral Once   enoxaparin (LOVENOX) injection  40 mg Subcutaneous Q24H   lipase/protease/amylase  12,000 Units Oral TID WC   metoprolol succinate  25 mg Oral Daily   pantoprazole  40 mg Oral BID   polyethylene glycol  17 g Oral BID   senna  1 tablet Oral Daily   Continuous Infusions: PRN Meds:.acetaminophen **OR** acetaminophen, baclofen, HYDROcodone-acetaminophen, naLOXone (NARCAN)  injection, nicotine, ondansetron (ZOFRAN) IV  Allergies as of 02/21/2021   (No Known Allergies)    Family History  Problem Relation Age of Onset   Lung cancer Mother     Social History   Socioeconomic History   Marital status: Divorced    Spouse name: Not on file   Number of children: Not on file   Years of education: Not on file   Highest education level:  Not on file  Occupational History   Not on file  Tobacco Use   Smoking status: Every Day   Smokeless tobacco: Not on file  Substance and Sexual Activity   Alcohol use: Not Currently   Drug use: Never   Sexual activity: Not on file  Other Topics Concern   Not on file  Social History Narrative   Married   Lives in [place]   Daughter in [place]   Works [employer]   +EtOH, Smoker   Social Determinants of Health   Financial Resource Strain: Not on file  Food Insecurity: Not on file  Transportation Needs: Not on file  Physical Activity: Not on file  Stress: Not on file  Social Connections: Not on file  Intimate Partner Violence: Not on file    Review of Systems:  Review of Systems  Constitutional:  Positive for malaise/fatigue. Negative for chills, fever and weight loss.  Respiratory:  Positive for shortness of breath. Negative for cough and wheezing.   Cardiovascular:  Negative for chest pain and leg swelling.  Gastrointestinal:  Positive for abdominal pain and nausea. Negative for blood in stool, constipation, diarrhea, heartburn, melena and vomiting.  Musculoskeletal:  Negative for falls.  Skin:  Negative for itching.  Neurological:  Negative for dizziness.  Psychiatric/Behavioral:  Negative for memory loss.     Physical Exam: Vital signs: Vitals:   03/03/21 1944 03/04/21 0511  BP: 138/71 134/64  Pulse: 64 64  Resp: 20 20  Temp: 98.8  F (37.1 C) 98.3 F (36.8 C)  SpO2: 97% 99%   Last BM Date: 03/01/21 General Alert, obese AAF, in NAD. No scleral icterus. Lung: CTAB no wheezing Heart: Regular rate and rhythm; no murmurs, No peripheral edema Abdomen: Soft, obese. Periumblical/epigastric tenderness. Normal bowel sounds. No rebound or guarding. No shifting dullness.  Skin: No jaundice. No palmar erythema or spider angioma. Neuro Alert and oriented x4;  grossly normal neurologically; no asterixis or clonus.   GI:  Lab Results: Recent Labs    03/02/21 1027  03/04/21 0452  WBC 10.7* 9.6  HGB 12.5 11.9*  HCT 38.0 36.5  PLT 212 231   BMET Recent Labs    03/02/21 0456 03/02/21 1027 03/04/21 0452  NA  --  134* 134*  K  --  3.9 3.8  CL  --  101 100  CO2  --  29 25  GLUCOSE  --  96 100*  BUN  --  10 9  CREATININE 1.05* 1.04* 1.12*  CALCIUM  --  9.3 9.0   LFT Recent Labs    03/02/21 1027  PROT 7.4  ALBUMIN 2.5*  AST 52*  ALT 34  ALKPHOS 72  BILITOT 1.1   PT/INR No results for input(s): LABPROT, INR in the last 72 hours.  Studies/Results: CT ABDOMEN PELVIS W CONTRAST  Result Date: 03/03/2021 CLINICAL DATA:  Pancreatitis, worsening abdominal pain. EXAM: CT ABDOMEN AND PELVIS WITH CONTRAST TECHNIQUE: Multidetector CT imaging of the abdomen and pelvis was performed using the standard protocol following bolus administration of intravenous contrast. CONTRAST:  148mL OMNIPAQUE IOHEXOL 350 MG/ML SOLN COMPARISON:  02/22/2021 FINDINGS: Lower chest: Again noted is a 3 mm nodule in the lingula on sequence 4 image 2. No pleural effusions. Subpleural and reticular densities in right lower lobe could represent chronic changes. Hepatobiliary: Enlarged left hepatic lobe. Liver has a slightly nodular contour and compatible with cirrhosis. There is concern for multiple enhancing lesions in the right hepatic lobe, largest measures 6.3 x 4.1 x 5.0 cm on sequence 2 image 24. These lesions were not clearly identified on the previous examination but may be related to the timing of the study. Findings are concerning for neoplastic lesions in the liver. The main portal venous system is patent. No gross abnormality to the gallbladder. Pancreas: There is enhancement throughout the pancreas without duct dilatation. Again noted is low-density fluid or material along the caudal aspect of the pancreatic body and tail. Spleen: Normal in size without focal abnormality. Adrenals/Urinary Tract: Normal adrenal glands. Normal appearance of the urinary bladder. Normal  appearance of both kidneys without hydronephrosis. No suspicious renal lesions. Stomach/Bowel: Normal appearance of stomach without distension. No evidence for bowel distention or obstruction. Oral contrast in the distal colon. No acute bowel inflammation. Vascular/Lymphatic: Atherosclerotic calcifications in the abdominal aorta without aneurysm. Evidence for varices around the distal esophagus. Evidence for a portosystemic shunt associated with varices on the left side of the abdomen that are draining into the IVC on series 2, image 49. There is no significant gastrorenal shunt identified. Reproductive: Uterus appears to be retroverted or retroflexed. No evidence for an adnexal mass. Other: The inflammatory changes in the upper abdomen around the pancreas have organized into a more distinct fluid collection along the right anterior abdomen on series 2, image 39. This is compatible with a large developing pseudocyst that measures approximately 14.1 x 5.6 x 10.9 cm. There is no evidence for gas within this developing pseudocyst. Umbilical hernia containing fat. No evidence for free  fluid or ascites. Musculoskeletal: No acute bone abnormality. IMPRESSION: 1. Multiple suspicious lesions in the right hepatic lobe. Patient has underlying cirrhosis and findings are concerning for multifocal hepatocellular carcinoma. Metastatic disease is also in the differential diagnosis. Recommend MRI of the abdomen with and without contrast. If the patient cannot tolerate a MRI, consider a multiphase liver CT. 2. Increased inflammatory changes in the upper abdomen compatible with pancreatitis and a large developing pseudocyst. This is developing pseudocyst measures up to 14 cm in size. 3. Cirrhosis with evidence of portal hypertension demonstrated by esophageal varices and varices draining into the IVC. 4. Indeterminate 3 mm pulmonary nodule in the lingula. Management of this pulmonary nodule depends on the etiology of the liver  lesions. These results will be called to the ordering clinician or representative by the Radiologist Assistant, and communication documented in the PACS or [employer]. Electronically Signed   By: Markus Daft M.D.   On: 03/03/2021 17:59   MR 3D Recon At Scanner  Result Date: 03/04/2021 CLINICAL DATA:  65 year old female with history of multiple hepatic lesions noted on recent CT examination. MRI for further characterization. EXAM: MRI ABDOMEN WITHOUT AND WITH CONTRAST (INCLUDING MRCP) TECHNIQUE: Multiplanar multisequence MR imaging of the abdomen was performed both before and after the administration of intravenous contrast. Heavily T2-weighted images of the biliary and pancreatic ducts were obtained, and three-dimensional MRCP images were rendered by post processing. CONTRAST:  19mL GADAVIST GADOBUTROL 1 MMOL/ML IV SOLN COMPARISON:  No prior abdominal MRI. CT the abdomen and pelvis 03/03/2021. FINDINGS: Lower chest: Unremarkable. Hepatobiliary: Liver has a shrunken appearance and nodular contour, indicative of underlying cirrhosis. Again noted are multiple enhancing hepatic lesions in the right lobe of the liver. The largest of these is in the central aspect of segments 5 and 6 (axial image 39 of series 21 and coronal image 21 of series 32) measuring approximately 5.8 x 3.6 x 5.6 cm. This lesion demonstrates relatively uniform arterial phase hyperenhancement, with exception of a central hypoenhancing area. This enhancement persists on delayed imaging where the lesion becomes nearly indiscernible from the adjacent normal liver (though clearly still enhancing on subtraction imaging). No definite pseudocapsule identified on delayed imaging. Some enhancement of that central hypoenhancing area is evident on more delayed imaging. This lesion demonstrates diffusion restriction on diffusion-weighted imaging. Numerous satellite smaller lesions are also noted with similar arterial phase hyperenhancement which is more  uniform in many of these lesions, with persistent enhancement on delayed imaging in several of the lesions, which are slightly above background hepatic enhancement. None of these clearly demonstrate pseudo capsule. The largest of the satellite lesions include a bilobed lesion in segment 5 (axial image 52 of series 21) measuring 2.6 x 1.7 cm, and a 1.6 x 1.2 cm lesion in segment 6 (axial image 46 of series 21). No intra or extrahepatic biliary ductal dilatation. Amorphous material lying dependently in the gallbladder which is mildly T1 hyperintense and T2 hypointense, compatible with biliary sludge. No gallbladder wall thickening. No definite pericholecystic fluid or surrounding inflammatory changes. No filling defect in the common bile duct to suggest choledocholithiasis. Common bile duct measures 3 mm in the porta hepatis. Pancreas: Extensive peripancreatic inflammatory changes are again noted, most of which extend into the root of the small bowel mesentery where there is a complex inflammatory mass of heterogeneous signal intensity on T1 and T2 weighted images, most compatible with an infiltrative pseudocyst in the mesentery. No definite pancreatic mass. No pancreatic ductal dilatation. Spleen:  Unremarkable.  Adrenals/Urinary Tract: Subcentimeter T1 hypointense, T2 hyperintense, nonenhancing lesions in the kidneys bilaterally, compatible with tiny simple cysts. No aggressive appearing renal lesions. No hydroureteronephrosis in the visualized portions of the abdomen. Stomach/Bowel: Large infiltrative pancreatic pseudocyst in the small bowel mesentery. Visualized portions of stomach, small bowel and colon are otherwise unremarkable. Vascular/Lymphatic: No aneurysm identified in the visualized abdominal vasculature. Numerous venous collaterals are noted in the mesentery and lower retroperitoneum on the left. No definite lymphadenopathy confidently identified in the abdomen. Other:  Trace amount of perihepatic  ascites. Musculoskeletal: No aggressive appearing osseous lesions are noted in the visualized portions of the skeleton. IMPRESSION: 1. Multiple enhancing lesions are noted throughout the right lobe of the liver, as detailed above. These have indeterminate imaging characteristics. Given the background of hepatic cirrhosis, the possibility of multifocal infiltrative hepatocellular carcinoma should be considered, although none of these lesions meet definite imaging criteria to establish that diagnosis. Correlation with AFP level is recommended. Other differential considerations include malignant and benign etiologies such as fibrolamellar hepatocellular carcinoma and multifocal focal nodular hyperplasia ([place]). Consideration for repeat abdominal MRI with and without IV gadolinium (Eovist) is also suggested, although biopsy may ultimately be required to establish a tissue diagnosis. 2. 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 3. Trace volume of perihepatic ascites. Electronically Signed   By: Vinnie Langton M.D.   On: 03/04/2021 08:52   MR ABDOMEN MRCP W WO CONTAST  Result Date: 03/04/2021 CLINICAL DATA:  65 year old female with history of multiple hepatic lesions noted on recent CT examination. MRI for further characterization. EXAM: MRI ABDOMEN WITHOUT AND WITH CONTRAST (INCLUDING MRCP) TECHNIQUE: Multiplanar multisequence MR imaging of the abdomen was performed both before and after the administration of intravenous contrast. Heavily T2-weighted images of the biliary and pancreatic ducts were obtained, and three-dimensional MRCP images were rendered by post processing. CONTRAST:  37mL GADAVIST GADOBUTROL 1 MMOL/ML IV SOLN COMPARISON:  No prior abdominal MRI. CT the abdomen and pelvis 03/03/2021. FINDINGS: Lower chest: Unremarkable. Hepatobiliary: Liver has a shrunken appearance and nodular contour, indicative of underlying cirrhosis. Again noted are  multiple enhancing hepatic lesions in the right lobe of the liver. The largest of these is in the central aspect of segments 5 and 6 (axial image 39 of series 21 and coronal image 21 of series 32) measuring approximately 5.8 x 3.6 x 5.6 cm. This lesion demonstrates relatively uniform arterial phase hyperenhancement, with exception of a central hypoenhancing area. This enhancement persists on delayed imaging where the lesion becomes nearly indiscernible from the adjacent normal liver (though clearly still enhancing on subtraction imaging). No definite pseudocapsule identified on delayed imaging. Some enhancement of that central hypoenhancing area is evident on more delayed imaging. This lesion demonstrates diffusion restriction on diffusion-weighted imaging. Numerous satellite smaller lesions are also noted with similar arterial phase hyperenhancement which is more uniform in many of these lesions, with persistent enhancement on delayed imaging in several of the lesions, which are slightly above background hepatic enhancement. None of these clearly demonstrate pseudo capsule. The largest of the satellite lesions include a bilobed lesion in segment 5 (axial image 52 of series 21) measuring 2.6 x 1.7 cm, and a 1.6 x 1.2 cm lesion in segment 6 (axial image 46 of series 21). No intra or extrahepatic biliary ductal dilatation. Amorphous material lying dependently in the gallbladder which is mildly T1 hyperintense and T2 hypointense, compatible with biliary sludge. No gallbladder wall thickening. No definite pericholecystic fluid or surrounding  inflammatory changes. No filling defect in the common bile duct to suggest choledocholithiasis. Common bile duct measures 3 mm in the porta hepatis. Pancreas: Extensive peripancreatic inflammatory changes are again noted, most of which extend into the root of the small bowel mesentery where there is a complex inflammatory mass of heterogeneous signal intensity on T1 and T2 weighted  images, most compatible with an infiltrative pseudocyst in the mesentery. No definite pancreatic mass. No pancreatic ductal dilatation. Spleen:  Unremarkable. Adrenals/Urinary Tract: Subcentimeter T1 hypointense, T2 hyperintense, nonenhancing lesions in the kidneys bilaterally, compatible with tiny simple cysts. No aggressive appearing renal lesions. No hydroureteronephrosis in the visualized portions of the abdomen. Stomach/Bowel: Large infiltrative pancreatic pseudocyst in the small bowel mesentery. Visualized portions of stomach, small bowel and colon are otherwise unremarkable. Vascular/Lymphatic: No aneurysm identified in the visualized abdominal vasculature. Numerous venous collaterals are noted in the mesentery and lower retroperitoneum on the left. No definite lymphadenopathy confidently identified in the abdomen. Other:  Trace amount of perihepatic ascites. Musculoskeletal: No aggressive appearing osseous lesions are noted in the visualized portions of the skeleton. IMPRESSION: 1. Multiple enhancing lesions are noted throughout the right lobe of the liver, as detailed above. These have indeterminate imaging characteristics. Given the background of hepatic cirrhosis, the possibility of multifocal infiltrative hepatocellular carcinoma should be considered, although none of these lesions meet definite imaging criteria to establish that diagnosis. Correlation with AFP level is recommended. Other differential considerations include malignant and benign etiologies such as fibrolamellar hepatocellular carcinoma and multifocal focal nodular hyperplasia ([place]). Consideration for repeat abdominal MRI with and without IV gadolinium (Eovist) is also suggested, although biopsy may ultimately be required to establish a tissue diagnosis. 2. 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 3. Trace volume of perihepatic ascites. Electronically Signed   By: Vinnie Langton M.D.   On: 03/04/2021 08:52    Impression and Plan  Acute Pancreatitis LIPASE 50 WBC 9.6 HGB 11.9 MCV 100.0 Platelets 231 BUN 11.9 Cr 1.12  GFR 55  Calcium 9.0 Glucose 100 Potassium 3.8  Magnesium 2.2  Triglycerides 104 New medications to cause pancreatitis, normal triglycerides, abdominal ultrasound did show biliary sludge however no ductal dilatations, no gross evidence of cholecystitis. General surgery has seen and signed off, suggest possible tertiary center for cholecystectomy if needed. Currently at this time continue supportive care with fluid management, pain management, and \ encouraged early ambulation CT AB and pelvis shows 14 cm pseudocyst, no leukocytosis, no fevers, will need outpatient MRCP versus repeat CT for pseudocyst, potentially consult IR for draining at that time if still present. Smoking cessation  In determinant liver mass on MRCP with cirrhosis Labs pending for liver mass: alpha-fetoprotein, CA 19-9.  Following with hepatology at [facility] recommend follow up MRCP in 4 weeks versus liver biopsy.  Cirrhosis WBC 9.6 HGB 11.9 MCV 100.0 Platelets 231 AST 52 ALT 34 Alkphos 72 TBili 1.1 GFR 55  INR 1.7 MELD-Na score: 18 at 02/24/2021  4:51 AM MELD score: 18 at 02/24/2021  4:51 AM Calculated from: Serum Creatinine: 1.18 mg/dL at 02/24/2021  4:51 AM Serum Sodium: 137 mmol/L at 02/24/2021  4:51 AM Total Bilirubin: 2.9 mg/dL at 02/23/2021  4:56 AM INR(ratio): 1.7 at 02/22/2021  4:12 AM Age: 11 years  Ascites- no personal history of ascites: AB [place] 03/01/2021 without ascites  Hepatic Encephalopathy:  No evidence at this time  No evidence of esophageal Varices: No evidence of melena/hematochezia/hematemesis. last EGD 07/2019 no varices- suggest  repeat outpatient    LOS: 10 days   Connie Crofts  PA-C 03/04/2021, 9:51 AM  Contact #  [phone]

## 2021-03-05 DIAGNOSIS — R772 Abnormality of alphafetoprotein: Secondary | ICD-10-CM | POA: Diagnosis present

## 2021-03-05 LAB — CANCER ANTIGEN 19-9: CA 19-9: 45 U/mL — ABNORMAL HIGH (ref 0–35)

## 2021-03-05 LAB — AFP TUMOR MARKER: AFP, Serum, Tumor Marker: 11 ng/mL — ABNORMAL HIGH (ref 0.0–9.2)

## 2021-03-05 MED ORDER — GABAPENTIN 100 MG PO CAPS
100.0000 mg | ORAL_CAPSULE | Freq: Three times a day (TID) | ORAL | Status: DC
  Administered 2021-03-05 – 2021-03-06 (×4): 100 mg via ORAL
  Filled 2021-03-05 (×4): qty 1

## 2021-03-05 MED ORDER — SENNOSIDES-DOCUSATE SODIUM 8.6-50 MG PO TABS
1.0000 | ORAL_TABLET | Freq: Two times a day (BID) | ORAL | Status: DC
  Administered 2021-03-05 – 2021-03-06 (×3): 1 via ORAL
  Filled 2021-03-05 (×2): qty 1

## 2021-03-05 NOTE — Progress Notes (Signed)
  Progress Note    Connie [name]   KCL:[id number]  DOB: 12-18-1955  DOA: 02/21/2021     11 Date of Service: 03/05/2021   Clinical Course Presents with abdominal pain, found to have acute pancreatitis.  Very slow improvement in symptoms with ongoing pain.  Repeat CT scan shows evidence of development of pseudocyst. MRI was done as there was concern for hepatic metastatic lesions.  GS was consulted for pain recommend outpatient follow-up. GI was consulted recommend outpatient follow-up. AFP and CA 19-9 elevated mildly.  Assessment and Plan Acute pancreatitis Lipase of 213 on admission.  Patient managed with bowel rest with gradual improvement of symptoms.   Patient initially stated she was tolerating a diet, later on started having complaints of worsening abdominal pain. Repeat CT scan shows evidence of pseudocyst. GI and general surgery both were consulted.  Recommend continue conservative measures. Monitor pain control.   Abdominal pain Unsure of etiology, but this was the reason the patient came in. Initial concern was acute pancreatitis which appeared to be improved. On personal review of CT abdomen/pelvis, gallbladder enlarged without concerning features. US paracentesis order without enough fluid for aspiration. General surgery consulted with no recommendations with regard to enlarged and possibly symptomatic gall bladder. Patient now stating abdominal pain is worse with food so possibly is more specifically related to pancreatitis.   Constipation In setting of poor oral intake. No evidence of significant constipation on imaging.   Dysuria Presumed UTI Patient treated with three days of Keflex. Symptoms resolved.   Alcoholic liver cirrhosis with ascites History of hepatitis C Patient has recently established with a Hepatologist at [facility].   Hypokalemia Repleted. Resolved.   AKI on CKD stage IIIa Resolved with IV fluids. Creatinine at baseline   Tobacco  use Counseled   Paroxysmal atrial fibrillation Currently rate controlled. Takes metoprolol as an outpatient. Not on anticoagulation. -Continue metoprolol   Obesity   Subjective:  Abdominal pain still 5 out of 10.  No nausea no vomiting.  No fever no chills.  Passing gas.  Objective Vitals:   03/04/21 1336 03/04/21 1946 03/05/21 0420 03/05/21 1434  BP: 125/67 (!) 145/73 (!) 148/64 138/61  Pulse: 67 (!) 57 62 70  Resp: 14 18 18 19   Temp: 98 F (36.7 C) 97.8 F (36.6 C) 98 F (36.7 C) 98.1 F (36.7 C)  TempSrc: Oral Oral Oral Oral  SpO2: 96%  97% 94%  Weight:   101.2 kg   Height:       101.2 kg     Exam General: Appear in mild distress, no Rash; Oral Mucosa Clear, moist. no Abnormal Neck Mass Or lumps, Conjunctiva normal  Cardiovascular: S1 and S2 Present, no Murmur, Respiratory: good respiratory effort, Bilateral Air entry present and CTA, no Crackles, no wheezes Abdomen: Bowel Sound present, Soft and mild  tenderness Extremities: no Pedal edema Neurology: alert and oriented to time, place, and person affect appropriate. no new focal deficit Gait not checked due to patient safety concerns    Labs / Other Information MRI shows multiple hepatic lesions. AFP mildly elevated.  CA 19-9 mildly elevated as well.   Disposition Plan: Status is: Inpatient  Remains inpatient appropriate because: Due to ongoing pain control issues.  As well as negligible p.o. intake.  Time spent: 35 minutes [facility] 03/05/2021, 6:19 PM

## 2021-03-06 LAB — COMPREHENSIVE METABOLIC PANEL
ALT: 39 U/L (ref 0–44)
AST: 70 U/L — ABNORMAL HIGH (ref 15–41)
Albumin: 2.5 g/dL — ABNORMAL LOW (ref 3.5–5.0)
Alkaline Phosphatase: 76 U/L (ref 38–126)
Anion gap: 6 (ref 5–15)
BUN: 8 mg/dL (ref 8–23)
CO2: 27 mmol/L (ref 22–32)
Calcium: 9.6 mg/dL (ref 8.9–10.3)
Chloride: 102 mmol/L (ref 98–111)
Creatinine, Ser: 1.14 mg/dL — ABNORMAL HIGH (ref 0.44–1.00)
GFR, Estimated: 53 mL/min — ABNORMAL LOW (ref >60)
Glucose, Bld: 99 mg/dL (ref 70–99)
Potassium: 4.1 mmol/L (ref 3.5–5.1)
Sodium: 135 mmol/L (ref 135–145)
Total Bilirubin: 1 mg/dL (ref 0.3–1.2)
Total Protein: 7.3 g/dL (ref 6.5–8.1)

## 2021-03-06 LAB — CBC
HCT: 35.5 % — ABNORMAL LOW (ref 36.0–46.0)
Hemoglobin: 11.7 g/dL — ABNORMAL LOW (ref 12.0–15.0)
MCH: 32.6 pg (ref 26.0–34.0)
MCHC: 33 g/dL (ref 30.0–36.0)
MCV: 98.9 fL (ref 80.0–100.0)
Platelets: 287 10*3/uL (ref 150–400)
RBC: 3.59 MIL/uL — ABNORMAL LOW (ref 3.87–5.11)
RDW: 14.7 % (ref 11.5–15.5)
WBC: 8.8 10*3/uL (ref 4.0–10.5)
nRBC: 0 % (ref 0.0–0.2)

## 2021-03-06 MED ORDER — NICOTINE 14 MG/24HR TD PT24
14.0000 mg | MEDICATED_PATCH | Freq: Every day | TRANSDERMAL | 0 refills | Status: DC | PRN
Start: 2021-03-06 — End: 2022-04-28

## 2021-03-06 MED ORDER — PANCRELIPASE (LIP-PROT-AMYL) 12000-38000 UNITS PO CPEP: 12000.0000 [IU] | ORAL_CAPSULE | Freq: Three times a day (TID) | ORAL | 0 refills | Status: DC

## 2021-03-06 MED ORDER — DOCUSATE SODIUM 100 MG PO CAPS: 100.0000 mg | ORAL_CAPSULE | Freq: Two times a day (BID) | ORAL | 0 refills | Status: AC

## 2021-03-06 MED ORDER — GABAPENTIN 100 MG PO CAPS: 100.0000 mg | ORAL_CAPSULE | Freq: Three times a day (TID) | ORAL | 0 refills | Status: DC

## 2021-03-06 MED ORDER — HYDROCODONE-ACETAMINOPHEN 5-325 MG PO TABS: 1.0000 | ORAL_TABLET | Freq: Four times a day (QID) | ORAL | 0 refills | Status: DC | PRN

## 2021-03-06 MED ORDER — POLYETHYLENE GLYCOL 3350 17 G PO PACK: 17.0000 g | PACK | Freq: Two times a day (BID) | ORAL | 0 refills | Status: DC

## 2021-03-06 NOTE — TOC Transition Note (Signed)
Transition of Care [facility]) - CM/SW Discharge Note   Patient Details  Name: Connie [name] MRN: [id number] Date of Birth: 1955-07-30  Transition of Care [facility] CM/SW Contact:  Ross Ludwig, LCSW Phone Number: 03/06/2021, 3:20 PM   Clinical Narrative:    CSW was informed that patient did not have a ride home.  CSW advised unit to call transportation services, however no one called back.  CSW provided a cab voucher for patient, and gave it to the bedside nurse to call a cab for patient to get a ride home.  CSW signing off, no other anticipated needs.         Patient Goals and CMS Choice        Discharge Placement                       Discharge Plan and Services                                     Social Determinants of Health (SDOH) Interventions     Readmission Risk Interventions No flowsheet data found.

## 2021-03-06 NOTE — Discharge Summary (Signed)
Physician Discharge Summary   Patient name: Connie [name]  Admit date:     02/21/2021  Discharge date: 03/06/2021  Discharge Physician: Berle Mull   PCP: Arthur Holms, NP   Recommendations at discharge:  Follow-up with GI.  May require further work-up for pseudocyst. Follow-up with [facility] for biopsy consideration. Follow-up with PCP.  Discharge Diagnoses Principal Problem:   Acute pancreatitis Active Problems:   Alcoholic cirrhosis of liver with ascites (HCC)   Dehydration   Fever   Hypokalemia   CKD (chronic kidney disease) stage 3, GFR 30-59 ml/min (HCC)   Acute constipation   Abdominal pain   Pancreatic pseudocyst   Elevated AFP   Resolved Diagnoses Resolved Problems:   * No resolved hospital problems. [facility]   No notes on file  Presents with abdominal pain, found to have acute pancreatitis.  Very slow improvement in symptoms with ongoing pain.  Repeat CT scan shows evidence of development of pseudocyst. MRI was done as there was concern for hepatic metastatic lesions.  GS was consulted for pain recommend outpatient follow-up. GI was consulted recommend outpatient follow-up. AFP and CA 19-9 elevated mildly.  Acute pancreatitis Lipase of 213 on admission.  Patient managed with bowel rest with gradual improvement of symptoms.   Patient initially stated she was tolerating a diet, later on started having complaints of worsening abdominal pain. Repeat CT scan shows evidence of pseudocyst. GI and general surgery both were consulted.  Recommend continue conservative measures. Will continue pain control. Outpatient follow-up with GI for pseudocyst management.   Abdominal pain Unsure of etiology, but this was the reason the patient came in. Initial concern was acute pancreatitis which appeared to be improved. On personal review of CT abdomen/pelvis, gallbladder enlarged without concerning features. US paracentesis order without enough  fluid for aspiration. General surgery consulted with no recommendations with regard to enlarged and possibly symptomatic gall bladder.  Pain improving.  Constipation In setting of poor oral intake. No evidence of significant constipation on imaging.   Dysuria Presumed UTI Patient treated with three days of Keflex. Symptoms resolved.   Alcoholic liver cirrhosis with ascites History of hepatitis C Patient has recently established with a Hepatologist at [facility]. Will need liver biopsy based on the AFP elevation as well as lesion seen on the MRI. Multiple liver lesions in a patient with known history of cirrhosis.  Concerning for multifocal hepatocellular carcinoma versus focal nodular hyperplasia.   Hypokalemia Repleted. Resolved.   AKI on CKD stage IIIa Resolved with IV fluids. Creatinine at baseline   Tobacco use Counseled   Paroxysmal atrial fibrillation Currently rate controlled. Takes metoprolol as an outpatient. Not on anticoagulation. -Continue metoprolol   Obesity  Body mass index is 35.08 kg/m.  Placing the patient at high risk of poor outcome.   Procedures performed: none   Condition at discharge: good  Exam General: Appear in mild distress, no Rash; Oral Mucosa Clear, moist. no Abnormal Neck Mass Or lumps, Conjunctiva normal  Cardiovascular: S1 and S2 Present, no Murmur, Respiratory: good respiratory effort, Bilateral Air entry present and CTA, no Crackles, no wheezes Abdomen: Bowel Sound present, Soft and no tenderness Extremities: no Pedal edema Neurology: alert and oriented to time, place, and person affect appropriate. no new focal deficit Gait not checked due to patient safety concerns    Disposition: Home  Discharge time: greater than 30 minutes.  Follow-up Information     Arthur Holms, NP. Schedule an appointment as soon  as possible for a visit in 1 week(s).   Specialty: Nurse Practitioner Contact information: [place]  [address] [phone]         [place], [place]. Schedule an appointment as soon as possible for a visit in 2 week(s).   Specialty: Nurse Practitioner Why: need liver biopsy Contact information: [place] [address] [phone]         [facility], [place]. Schedule an appointment as soon as possible for a visit in 2 week(s).   Why: hospital follow up, pancreatitis with pseudocyst. Contact information: [place] [address] [phone]                 Allergies as of 03/06/2021   No Known Allergies      Medication List     TAKE these medications    acetaminophen 500 MG tablet Commonly known as: TYLENOL Take 1,000 mg by mouth every 6 (six) hours as needed for mild pain.   calcium carbonate 500 MG chewable tablet Commonly known as: TUMS - dosed in mg elemental calcium Chew 1 tablet by mouth daily as needed for indigestion or heartburn.   co-enzyme Q-10 30 MG capsule Take 30 mg by mouth 3 (three) times a week.   docusate sodium 100 MG capsule Commonly known as: Colace Take 1 capsule (100 mg total) by mouth 2 (two) times daily.   Fish Oil 1000 MG Caps Take 1,000 mg by mouth daily.   gabapentin 100 MG capsule Commonly known as: NEURONTIN Take 1 capsule (100 mg total) by mouth 3 (three) times daily.   HYDROcodone-acetaminophen 5-325 MG tablet Commonly known as: NORCO/VICODIN Take 1 tablet by mouth every 6 (six) hours as needed for moderate pain or severe pain.   lipase/protease/amylase 12000-38000 units Cpep capsule Commonly known as: CREON Take 1 capsule (12,000 Units total) by mouth 3 (three) times daily with meals.   metoprolol succinate 25 MG 24 hr tablet Commonly known as: TOPROL-XL Take 1 tablet (25 mg total) by mouth daily.   nicotine 14 mg/24hr patch Commonly known as: NICODERM CQ - dosed in mg/24 hours Place 1 patch (14 mg total) onto the skin daily as needed (smoking cessation).    pantoprazole 40 MG tablet Commonly known as: Protonix Take 1 tablet (40 mg total) by mouth 2 (two) times daily. What changed: when to take this   polyethylene glycol 17 g packet Commonly known as: MIRALAX / GLYCOLAX Take 17 g by mouth 2 (two) times daily.   Voltaren 1 % Gel Generic drug: diclofenac Sodium Apply 2 g topically 2 (two) times daily as needed (pain).        DG Abd 1 View  Result Date: 02/28/2021 CLINICAL DATA:  Abdominal pain. EXAM: ABDOMEN - 1 VIEW COMPARISON:  None. FINDINGS: The bowel gas pattern is normal. No radio-opaque calculi or other significant radiographic abnormality are seen. IMPRESSION: Negative. Electronically Signed   By: Marijo Conception M.D.   On: 02/28/2021 12:53   US Abdomen Complete  Result Date: 02/22/2021 CLINICAL DATA:  Right-sided abdominal pain. EXAM: ABDOMEN ULTRASOUND COMPLETE COMPARISON:  Radiology FINDINGS: Evaluation is limited due to body habitus and overlying bowel gas and suboptimal visualization of the structures. Gallbladder: Nonshadowing echogenic debris within the gallbladder, likely sludge. Stones are less likely. No gallbladder wall thickening or pericholecystic fluid. Negative sonographic Murphy's sign. Common bile duct: Diameter: 4 mm Liver: The liver is heterogeneous with irregular contour in keeping with cirrhosis. Portal vein  is patent on color Doppler imaging with normal direction of blood flow towards the liver. IVC: No abnormality visualized. Pancreas: Poorly visualized. Spleen: Size and appearance within normal limits. Right Kidney: Length: 10.5 cm. Normal echogenicity. No hydronephrosis or shadowing stone. Left Kidney: Length: 9.7 cm. Normal echogenicity. No hydronephrosis or shadowing stone. Abdominal aorta: No aneurysm visualized. Other findings: Small ascites. IMPRESSION: 1. Cirrhosis and small ascites. 2. Gallbladder sludge. No sonographic evidence of acute cholecystitis. Electronically Signed   By: Anner Crete M.D.    On: 02/22/2021 01:36   CT ABDOMEN PELVIS W CONTRAST  Result Date: 03/03/2021 CLINICAL DATA:  Pancreatitis, worsening abdominal pain. EXAM: CT ABDOMEN AND PELVIS WITH CONTRAST TECHNIQUE: Multidetector CT imaging of the abdomen and pelvis was performed using the standard protocol following bolus administration of intravenous contrast. CONTRAST:  177mL OMNIPAQUE IOHEXOL 350 MG/ML SOLN COMPARISON:  02/22/2021 FINDINGS: Lower chest: Again noted is a 3 mm nodule in the lingula on sequence 4 image 2. No pleural effusions. Subpleural and reticular densities in right lower lobe could represent chronic changes. Hepatobiliary: Enlarged left hepatic lobe. Liver has a slightly nodular contour and compatible with cirrhosis. There is concern for multiple enhancing lesions in the right hepatic lobe, largest measures 6.3 x 4.1 x 5.0 cm on sequence 2 image 24. These lesions were not clearly identified on the previous examination but may be related to the timing of the study. Findings are concerning for neoplastic lesions in the liver. The main portal venous system is patent. No gross abnormality to the gallbladder. Pancreas: There is enhancement throughout the pancreas without duct dilatation. Again noted is low-density fluid or material along the caudal aspect of the pancreatic body and tail. Spleen: Normal in size without focal abnormality. Adrenals/Urinary Tract: Normal adrenal glands. Normal appearance of the urinary bladder. Normal appearance of both kidneys without hydronephrosis. No suspicious renal lesions. Stomach/Bowel: Normal appearance of stomach without distension. No evidence for bowel distention or obstruction. Oral contrast in the distal colon. No acute bowel inflammation. Vascular/Lymphatic: Atherosclerotic calcifications in the abdominal aorta without aneurysm. Evidence for varices around the distal esophagus. Evidence for a portosystemic shunt associated with varices on the left side of the abdomen that are  draining into the IVC on series 2, image 49. There is no significant gastrorenal shunt identified. Reproductive: Uterus appears to be retroverted or retroflexed. No evidence for an adnexal mass. Other: The inflammatory changes in the upper abdomen around the pancreas have organized into a more distinct fluid collection along the right anterior abdomen on series 2, image 39. This is compatible with a large developing pseudocyst that measures approximately 14.1 x 5.6 x 10.9 cm. There is no evidence for gas within this developing pseudocyst. Umbilical hernia containing fat. No evidence for free fluid or ascites. Musculoskeletal: No acute bone abnormality. IMPRESSION: 1. Multiple suspicious lesions in the right hepatic lobe. Patient has underlying cirrhosis and findings are concerning for multifocal hepatocellular carcinoma. Metastatic disease is also in the differential diagnosis. Recommend MRI of the abdomen with and without contrast. If the patient cannot tolerate a MRI, consider a multiphase liver CT. 2. Increased inflammatory changes in the upper abdomen compatible with pancreatitis and a large developing pseudocyst. This is developing pseudocyst measures up to 14 cm in size. 3. Cirrhosis with evidence of portal hypertension demonstrated by esophageal varices and varices draining into the IVC. 4. Indeterminate 3 mm pulmonary nodule in the lingula. Management of this pulmonary nodule depends on the etiology of the liver lesions. These results will  be called to the ordering clinician or representative by the Radiologist Assistant, and communication documented in the PACS or [employer]. Electronically Signed   By: Markus Daft M.D.   On: 03/03/2021 17:59   CT ABDOMEN PELVIS W CONTRAST  Result Date: 02/22/2021 CLINICAL DATA:  Abdominal pain.  Concern for diverticulitis. EXAM: CT ABDOMEN AND PELVIS WITH CONTRAST TECHNIQUE: Multidetector CT imaging of the abdomen and pelvis was performed using the standard  protocol following bolus administration of intravenous contrast. CONTRAST:  1mL OMNIPAQUE IOHEXOL 350 MG/ML SOLN COMPARISON:  Abdominal ultrasound dated 02/22/2021. FINDINGS: Lower chest: The visualized lung bases are clear. No intra-abdominal free air.  Small ascites. Hepatobiliary: Cirrhosis. No intrahepatic biliary dilatation. The gallbladder is unremarkable. Pancreas: Inflammatory changes of the pancreas consistent with acute pancreatitis. No drainable fluid collection, abscess or pseudocyst. Spleen: Normal in size without focal abnormality. Adrenals/Urinary Tract: The adrenal glands are unremarkable. The kidneys, visualized ureters, and urinary bladder appear unremarkable. Stomach/Bowel: There is no bowel obstruction or active inflammation. The appendix is normal. Scattered colonic diverticulosis without active inflammatory changes. Vascular/Lymphatic: Mild aortoiliac atherosclerotic disease. The IVC is unremarkable. No portal venous gas. There is no adenopathy. Reproductive: Small ill-defined uterine fibroid.  No adnexal masses. Other: Stranding of the mesentery secondary to acute pancreatitis. Musculoskeletal: No acute or significant osseous findings. IMPRESSION: 1. Acute pancreatitis. No abscess or pseudocyst. 2. Cirrhosis with small ascites. 3. Colonic diverticulosis. No bowel obstruction. Normal appendix. 4. Aortic Atherosclerosis (ICD10-I70.0). Electronically Signed   By: Anner Crete M.D.   On: 02/22/2021 02:05   MR 3D Recon At Scanner  Result Date: 03/04/2021 CLINICAL DATA:  65 year old female with history of multiple hepatic lesions noted on recent CT examination. MRI for further characterization. EXAM: MRI ABDOMEN WITHOUT AND WITH CONTRAST (INCLUDING MRCP) TECHNIQUE: Multiplanar multisequence MR imaging of the abdomen was performed both before and after the administration of intravenous contrast. Heavily T2-weighted images of the biliary and pancreatic ducts were obtained, and  three-dimensional MRCP images were rendered by post processing. CONTRAST:  42mL GADAVIST GADOBUTROL 1 MMOL/ML IV SOLN COMPARISON:  No prior abdominal MRI. CT the abdomen and pelvis 03/03/2021. FINDINGS: Lower chest: Unremarkable. Hepatobiliary: Liver has a shrunken appearance and nodular contour, indicative of underlying cirrhosis. Again noted are multiple enhancing hepatic lesions in the right lobe of the liver. The largest of these is in the central aspect of segments 5 and 6 (axial image 39 of series 21 and coronal image 21 of series 32) measuring approximately 5.8 x 3.6 x 5.6 cm. This lesion demonstrates relatively uniform arterial phase hyperenhancement, with exception of a central hypoenhancing area. This enhancement persists on delayed imaging where the lesion becomes nearly indiscernible from the adjacent normal liver (though clearly still enhancing on subtraction imaging). No definite pseudocapsule identified on delayed imaging. Some enhancement of that central hypoenhancing area is evident on more delayed imaging. This lesion demonstrates diffusion restriction on diffusion-weighted imaging. Numerous satellite smaller lesions are also noted with similar arterial phase hyperenhancement which is more uniform in many of these lesions, with persistent enhancement on delayed imaging in several of the lesions, which are slightly above background hepatic enhancement. None of these clearly demonstrate pseudo capsule. The largest of the satellite lesions include a bilobed lesion in segment 5 (axial image 52 of series 21) measuring 2.6 x 1.7 cm, and a 1.6 x 1.2 cm lesion in segment 6 (axial image 46 of series 21). No intra or extrahepatic biliary ductal dilatation. Amorphous material lying dependently in the gallbladder which  is mildly T1 hyperintense and T2 hypointense, compatible with biliary sludge. No gallbladder wall thickening. No definite pericholecystic fluid or surrounding inflammatory changes. No filling  defect in the common bile duct to suggest choledocholithiasis. Common bile duct measures 3 mm in the porta hepatis. Pancreas: Extensive peripancreatic inflammatory changes are again noted, most of which extend into the root of the small bowel mesentery where there is a complex inflammatory mass of heterogeneous signal intensity on T1 and T2 weighted images, most compatible with an infiltrative pseudocyst in the mesentery. No definite pancreatic mass. No pancreatic ductal dilatation. Spleen:  Unremarkable. Adrenals/Urinary Tract: Subcentimeter T1 hypointense, T2 hyperintense, nonenhancing lesions in the kidneys bilaterally, compatible with tiny simple cysts. No aggressive appearing renal lesions. No hydroureteronephrosis in the visualized portions of the abdomen. Stomach/Bowel: Large infiltrative pancreatic pseudocyst in the small bowel mesentery. Visualized portions of stomach, small bowel and colon are otherwise unremarkable. Vascular/Lymphatic: No aneurysm identified in the visualized abdominal vasculature. Numerous venous collaterals are noted in the mesentery and lower retroperitoneum on the left. No definite lymphadenopathy confidently identified in the abdomen. Other:  Trace amount of perihepatic ascites. Musculoskeletal: No aggressive appearing osseous lesions are noted in the visualized portions of the skeleton. IMPRESSION: 1. Multiple enhancing lesions are noted throughout the right lobe of the liver, as detailed above. These have indeterminate imaging characteristics. Given the background of hepatic cirrhosis, the possibility of multifocal infiltrative hepatocellular carcinoma should be considered, although none of these lesions meet definite imaging criteria to establish that diagnosis. Correlation with AFP level is recommended. Other differential considerations include malignant and benign etiologies such as fibrolamellar hepatocellular carcinoma and multifocal focal nodular hyperplasia ([place]).  Consideration for repeat abdominal MRI with and without IV gadolinium (Eovist) is also suggested, although biopsy may ultimately be required to establish a tissue diagnosis. 2. 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 3. Trace volume of perihepatic ascites. Electronically Signed   By: Vinnie Langton M.D.   On: 03/04/2021 08:52   DG CHEST PORT 1 VIEW  Result Date: 02/22/2021 CLINICAL DATA:  Acute pancreatitis EXAM: PORTABLE CHEST 1 VIEW COMPARISON:  08/02/2019 FINDINGS: The heart size and mediastinal contours are within normal limits. Both lungs are clear. The visualized skeletal structures are unremarkable. IMPRESSION: No active disease. Electronically Signed   By: Jorje Guild M.D.   On: 02/22/2021 08:11   MR ABDOMEN MRCP W WO CONTAST  Result Date: 03/04/2021 CLINICAL DATA:  65 year old female with history of multiple hepatic lesions noted on recent CT examination. MRI for further characterization. EXAM: MRI ABDOMEN WITHOUT AND WITH CONTRAST (INCLUDING MRCP) TECHNIQUE: Multiplanar multisequence MR imaging of the abdomen was performed both before and after the administration of intravenous contrast. Heavily T2-weighted images of the biliary and pancreatic ducts were obtained, and three-dimensional MRCP images were rendered by post processing. CONTRAST:  62mL GADAVIST GADOBUTROL 1 MMOL/ML IV SOLN COMPARISON:  No prior abdominal MRI. CT the abdomen and pelvis 03/03/2021. FINDINGS: Lower chest: Unremarkable. Hepatobiliary: Liver has a shrunken appearance and nodular contour, indicative of underlying cirrhosis. Again noted are multiple enhancing hepatic lesions in the right lobe of the liver. The largest of these is in the central aspect of segments 5 and 6 (axial image 39 of series 21 and coronal image 21 of series 32) measuring approximately 5.8 x 3.6 x 5.6 cm. This lesion demonstrates relatively uniform arterial phase hyperenhancement, with  exception of a central hypoenhancing area. This enhancement persists on delayed imaging where the lesion  becomes nearly indiscernible from the adjacent normal liver (though clearly still enhancing on subtraction imaging). No definite pseudocapsule identified on delayed imaging. Some enhancement of that central hypoenhancing area is evident on more delayed imaging. This lesion demonstrates diffusion restriction on diffusion-weighted imaging. Numerous satellite smaller lesions are also noted with similar arterial phase hyperenhancement which is more uniform in many of these lesions, with persistent enhancement on delayed imaging in several of the lesions, which are slightly above background hepatic enhancement. None of these clearly demonstrate pseudo capsule. The largest of the satellite lesions include a bilobed lesion in segment 5 (axial image 52 of series 21) measuring 2.6 x 1.7 cm, and a 1.6 x 1.2 cm lesion in segment 6 (axial image 46 of series 21). No intra or extrahepatic biliary ductal dilatation. Amorphous material lying dependently in the gallbladder which is mildly T1 hyperintense and T2 hypointense, compatible with biliary sludge. No gallbladder wall thickening. No definite pericholecystic fluid or surrounding inflammatory changes. No filling defect in the common bile duct to suggest choledocholithiasis. Common bile duct measures 3 mm in the porta hepatis. Pancreas: Extensive peripancreatic inflammatory changes are again noted, most of which extend into the root of the small bowel mesentery where there is a complex inflammatory mass of heterogeneous signal intensity on T1 and T2 weighted images, most compatible with an infiltrative pseudocyst in the mesentery. No definite pancreatic mass. No pancreatic ductal dilatation. Spleen:  Unremarkable. Adrenals/Urinary Tract: Subcentimeter T1 hypointense, T2 hyperintense, nonenhancing lesions in the kidneys bilaterally, compatible with tiny simple cysts. No  aggressive appearing renal lesions. No hydroureteronephrosis in the visualized portions of the abdomen. Stomach/Bowel: Large infiltrative pancreatic pseudocyst in the small bowel mesentery. Visualized portions of stomach, small bowel and colon are otherwise unremarkable. Vascular/Lymphatic: No aneurysm identified in the visualized abdominal vasculature. Numerous venous collaterals are noted in the mesentery and lower retroperitoneum on the left. No definite lymphadenopathy confidently identified in the abdomen. Other:  Trace amount of perihepatic ascites. Musculoskeletal: No aggressive appearing osseous lesions are noted in the visualized portions of the skeleton. IMPRESSION: 1. Multiple enhancing lesions are noted throughout the right lobe of the liver, as detailed above. These have indeterminate imaging characteristics. Given the background of hepatic cirrhosis, the possibility of multifocal infiltrative hepatocellular carcinoma should be considered, although none of these lesions meet definite imaging criteria to establish that diagnosis. Correlation with AFP level is recommended. Other differential considerations include malignant and benign etiologies such as fibrolamellar hepatocellular carcinoma and multifocal focal nodular hyperplasia ([place]). Consideration for repeat abdominal MRI with and without IV gadolinium (Eovist) is also suggested, although biopsy may ultimately be required to establish a tissue diagnosis. 2. 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 3. Trace volume of perihepatic ascites. Electronically Signed   By: Vinnie Langton M.D.   On: 03/04/2021 08:52   [place] ASCITES (ABDOMEN LIMITED)  Result Date: 03/01/2021 CLINICAL DATA:  History of cirrhosis, now with abdominal distension. Please perform ascites search ultrasound and ultrasound-guided paracentesis as indicated. EXAM: LIMITED ABDOMEN ULTRASOUND FOR ASCITES TECHNIQUE: Limited  ultrasound survey for ascites was performed in all four abdominal quadrants. COMPARISON:  CT abdomen pelvis-02/23/2019 FINDINGS: Sonographic evaluation of the abdomen is negative for any significant intra-abdominal ascites. No paracentesis attempted. IMPRESSION: No significant intra-abdominal ascites.  No paracentesis attempted. Electronically Signed   By: Sandi Mariscal M.D.   On: 03/01/2021 10:09   Results for orders placed or performed during the hospital encounter of 02/21/21  Resp Panel by  RT-PCR (Flu A&B, Covid) Nasopharyngeal Swab     Status: None   Collection Time: 02/22/21  3:41 AM   Specimen: Nasopharyngeal Swab; Nasopharyngeal(NP) swabs in vial transport medium  Result Value Ref Range Status   SARS Coronavirus 2 by RT PCR NEGATIVE NEGATIVE Final    Comment: (NOTE) SARS-CoV-2 target nucleic acids are NOT DETECTED.  The SARS-CoV-2 RNA is generally detectable in upper respiratory specimens during the acute phase of infection. The lowest concentration of SARS-CoV-2 viral copies this assay can detect is 138 copies/mL. A negative result does not preclude SARS-Cov-2 infection and should not be used as the sole basis for treatment or other patient management decisions. A negative result may occur with  improper specimen collection/handling, submission of specimen other than nasopharyngeal swab, presence of viral mutation(s) within the areas targeted by this assay, and inadequate number of viral copies(<138 copies/mL). A negative result must be combined with clinical observations, patient history, and epidemiological information. The expected result is Negative.  Fact Sheet for Patients:  EntrepreneurPulse.com.au  Fact Sheet for Healthcare Providers:  IncredibleEmployment.be  This test is no t yet approved or cleared by the [place] FDA and  has been authorized for detection and/or diagnosis of SARS-CoV-2 by FDA under an Emergency Use  Authorization (EUA). This EUA will remain  in effect (meaning this test can be used) for the duration of the COVID-19 declaration under Section 564(b)(1) of the Act, 21 U.S.C.section 360bbb-3(b)(1), unless the authorization is terminated  or revoked sooner.       Influenza A by PCR NEGATIVE NEGATIVE Final   Influenza B by PCR NEGATIVE NEGATIVE Final    Comment: (NOTE) The Xpert Xpress SARS-CoV-2/FLU/RSV plus assay is intended as an aid in the diagnosis of influenza from Nasopharyngeal swab specimens and should not be used as a sole basis for treatment. Nasal washings and aspirates are unacceptable for Xpert Xpress SARS-CoV-2/FLU/RSV testing.  Fact Sheet for Patients: EntrepreneurPulse.com.au  Fact Sheet for Healthcare Providers: IncredibleEmployment.be  This test is not yet approved or cleared by the [place] FDA and has been authorized for detection and/or diagnosis of SARS-CoV-2 by FDA under an Emergency Use Authorization (EUA). This EUA will remain in effect (meaning this test can be used) for the duration of the COVID-19 declaration under Section 564(b)(1) of the Act, 21 U.S.C. section 360bbb-3(b)(1), unless the authorization is terminated or revoked.  Performed at [facility], [place] [address], [place], [place] [address]     Signed:  Berle Mull MD.  [facility] 03/06/2021, 2:16 PM

## 2021-03-06 NOTE — Progress Notes (Signed)
This shift pt has tolerated full liquid diet with no complaint  of nausea or vomiting. Will continue to monitor

## 2021-03-29 ENCOUNTER — Other Ambulatory Visit: Payer: Self-pay | Admitting: Nurse Practitioner

## 2021-03-29 DIAGNOSIS — D376 Neoplasm of uncertain behavior of liver, gallbladder and bile ducts: Secondary | ICD-10-CM

## 2021-04-15 ENCOUNTER — Other Ambulatory Visit: Payer: Medicare Other

## 2021-09-15 ENCOUNTER — Other Ambulatory Visit: Payer: Self-pay | Admitting: Registered Nurse

## 2021-09-15 ENCOUNTER — Other Ambulatory Visit (HOSPITAL_COMMUNITY): Payer: Self-pay | Admitting: Registered Nurse

## 2021-09-15 DIAGNOSIS — E2839 Other primary ovarian failure: Secondary | ICD-10-CM

## 2021-09-15 DIAGNOSIS — I130 Hypertensive heart and chronic kidney disease with heart failure and stage 1 through stage 4 chronic kidney disease, or unspecified chronic kidney disease: Secondary | ICD-10-CM

## 2021-09-23 ENCOUNTER — Other Ambulatory Visit: Payer: Self-pay | Admitting: Nurse Practitioner

## 2021-09-23 DIAGNOSIS — D376 Neoplasm of uncertain behavior of liver, gallbladder and bile ducts: Secondary | ICD-10-CM

## 2021-10-04 ENCOUNTER — Other Ambulatory Visit: Payer: Self-pay | Admitting: Registered Nurse

## 2021-10-04 ENCOUNTER — Encounter (HOSPITAL_COMMUNITY): Payer: Self-pay | Admitting: Internal Medicine

## 2021-10-04 DIAGNOSIS — E2839 Other primary ovarian failure: Secondary | ICD-10-CM

## 2021-10-06 ENCOUNTER — Ambulatory Visit
Admission: RE | Admit: 2021-10-06 | Discharge: 2021-10-06 | Disposition: A | Discharge status: Home / Self Care | Payer: Medicare Other | Source: Ambulatory Visit | Attending: Registered Nurse | Admitting: Registered Nurse

## 2021-10-06 ENCOUNTER — Other Ambulatory Visit: Payer: Self-pay | Admitting: Registered Nurse

## 2021-10-06 DIAGNOSIS — E2839 Other primary ovarian failure: Secondary | ICD-10-CM

## 2022-01-20 ENCOUNTER — Other Ambulatory Visit: Payer: Self-pay | Admitting: Nurse Practitioner

## 2022-01-20 DIAGNOSIS — D376 Neoplasm of uncertain behavior of liver, gallbladder and bile ducts: Secondary | ICD-10-CM

## 2022-01-29 DIAGNOSIS — C801 Malignant (primary) neoplasm, unspecified: Secondary | ICD-10-CM | POA: Insufficient documentation

## 2022-01-29 HISTORY — DX: Malignant (primary) neoplasm, unspecified: C80.1

## 2022-02-06 ENCOUNTER — Ambulatory Visit
Admission: RE | Admit: 2022-02-06 | Discharge: 2022-02-06 | Disposition: A | Discharge status: Home / Self Care | Payer: 59 | Source: Ambulatory Visit | Attending: Nurse Practitioner | Admitting: Nurse Practitioner

## 2022-02-06 DIAGNOSIS — D376 Neoplasm of uncertain behavior of liver, gallbladder and bile ducts: Secondary | ICD-10-CM

## 2022-02-06 MED ORDER — GADOXETATE DISODIUM 0.25 MMOL/ML IV SOLN
10.0000 mL | Freq: Once | INTRAVENOUS | Status: AC | PRN
  Administered 2022-02-06: 10 mL via INTRAVENOUS

## 2022-02-13 ENCOUNTER — Other Ambulatory Visit: Payer: Self-pay | Admitting: Nurse Practitioner

## 2022-02-13 DIAGNOSIS — C220 Liver cell carcinoma: Secondary | ICD-10-CM

## 2022-02-14 ENCOUNTER — Other Ambulatory Visit: Payer: Self-pay | Admitting: Interventional Radiology

## 2022-02-14 ENCOUNTER — Telehealth: Payer: Self-pay | Admitting: Hematology

## 2022-02-14 ENCOUNTER — Ambulatory Visit
Admission: RE | Admit: 2022-02-14 | Discharge: 2022-02-14 | Disposition: A | Discharge status: Home / Self Care | Payer: 59 | Source: Ambulatory Visit | Attending: Nurse Practitioner | Admitting: Nurse Practitioner

## 2022-02-14 ENCOUNTER — Other Ambulatory Visit: Payer: Self-pay | Admitting: *Deleted

## 2022-02-14 ENCOUNTER — Encounter: Payer: Self-pay | Admitting: Lab

## 2022-02-14 DIAGNOSIS — C220 Liver cell carcinoma: Secondary | ICD-10-CM

## 2022-02-14 HISTORY — PX: IR RADIOLOGIST EVAL & MGMT: IMG5224

## 2022-02-14 NOTE — Consult Note (Signed)
Chief Complaint: [place]  Referring Physician(s): Drazek,Dawn  History of Present Illness: Connie [name] is a 66 y.o. 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  Patient was admitted to [facility] in April 2021 with atrial fibrillation and RVR however developed hematemesis after the initiation of a heparin drip.  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  She underwent EGD at the time of that admission which demonstrated gastritis and duodenitis but no esophageal or gastric varices or evidence of portal hypertensive gastropathy.  Patient was found to have an approximately 2.5 cm liver lesion on abdominal ultrasound however did not have follow-up for this following her discharge.    She was readmitted in October 2022 with acute pancreatitis, however has never completed a GI work-up for evaluation of pancreatic pseudocysts.  Patient is currently visiting family in [place] and will not be back to the area until October 24.  She reports baseline fatigue though states this is unchanged for the past several years.  The patient has no history of ascites or SBP.  She denies increased abdominal girth or abdominal pain.    She reports back pain however states this has been present since her initial diagnosis of pancreatitis and has not worsened recently.  She denies history of hepatic encephalopathy or problems with memory/confusion or concentration.  Patient has history of noncompliance, partially due to poor insurance coverage.  Patient reports significant reduction in her alcohol intake, currently consuming alcohol only approximately once per month.   Past Medical History:  Diagnosis Date   Alcohol abuse    Helicobacter pylori gastritis 08/07/2019    Hepatitis C antibody positive in blood - negative RNA    Hypertension     Past Surgical History:  Procedure Laterality Date   BIOPSY  08/03/2019   Procedure: BIOPSY;  Surgeon: Gatha Mayer, MD;  Location: [facility] ENDOSCOPY;  Service: Endoscopy;;   ESOPHAGOGASTRODUODENOSCOPY (EGD) WITH PROPOFOL N/A 08/03/2019   Procedure: ESOPHAGOGASTRODUODENOSCOPY (EGD) WITH PROPOFOL;  Surgeon: Gatha Mayer, MD;  Location: [facility] ENDOSCOPY;  Service: Endoscopy;  Laterality: N/A;    Allergies: Patient has no known allergies.  Medications: Prior to Admission medications   Medication Sig Start Date End Date Taking? Authorizing Provider  acetaminophen (TYLENOL) 500 MG tablet Take 1,000 mg by mouth every 6 (six) hours as needed for mild pain.    [provider]  calcium carbonate (TUMS - DOSED IN MG ELEMENTAL CALCIUM) 500 MG chewable tablet Chew 1 tablet by mouth daily as needed for indigestion or heartburn.    [provider]  co-enzyme Q-10 30 MG capsule Take 30 mg by mouth 3 (three) times a week.    [provider]  diclofenac Sodium (VOLTAREN) 1 % GEL Apply 2 g topically 2 (two) times daily as needed (pain).    [provider]  docusate sodium (COLACE) 100 MG capsule Take 1 capsule (100 mg total) by mouth 2 (two) times daily. 03/06/21 03/06/22  Lavina Hamman, MD  gabapentin (NEURONTIN) 100 MG capsule Take 1 capsule (100 mg total) by mouth 3 (three) times daily. 03/06/21 04/05/21  Lavina Hamman, MD  HYDROcodone-acetaminophen (NORCO/VICODIN) 5-325 MG tablet Take 1 tablet by mouth every 6 (six) hours as needed for moderate pain or severe pain. 03/06/21   Lavina Hamman, MD  lipase/protease/amylase ([place]) [phone]  units CPEP capsule Take 1 capsule (12,000 Units total) by mouth 3 (three) times daily with meals. 03/06/21   Lavina Hamman, MD  metoprolol succinate (TOPROL-XL) 25 MG 24 hr tablet Take 1 tablet (25 mg total) by mouth daily. 08/04/19   Earlene Plater, MD  nicotine  (NICODERM CQ - DOSED IN MG/24 HOURS) 14 mg/24hr patch Place 1 patch (14 mg total) onto the skin daily as needed (smoking cessation). 03/06/21   Lavina Hamman, MD  Omega-3 Fatty Acids (FISH OIL) 1000 MG CAPS Take 1,000 mg by mouth daily.    [provider]  pantoprazole (PROTONIX) 40 MG tablet Take 1 tablet (40 mg total) by mouth 2 (two) times daily. Patient taking differently: Take 40 mg by mouth daily. 08/04/19 02/21/21  Earlene Plater, MD  polyethylene glycol (MIRALAX / GLYCOLAX) 17 g packet Take 17 g by mouth 2 (two) times daily. 03/06/21   Lavina Hamman, MD     Family History  Problem Relation Age of Onset   Lung cancer Mother     Social History   Socioeconomic History   Marital status: Unknown    Spouse name: Not on file   Number of children: Not on file   Years of education: Not on file   Highest education level: Not on file  Occupational History   Not on file  Tobacco Use   Smoking status: Not on file   Smokeless tobacco: Not on file  Substance and Sexual Activity   Alcohol use: Not Currently   Drug use: Never   Sexual activity: Not on file  Other Topics Concern   Not on file  Social History Narrative   ** Merged History Encounter **       Married Lives in [facility] Daughter in [place] Works [employer], Smoker   Social Determinants of Health   Financial Resource Strain: Not on file  Food Insecurity: Not on file  Transportation Needs: Not on file  Physical Activity: Not on file  Stress: Not on file  Social Connections: Not on file    ECOG Status: 1 - Symptomatic but completely ambulatory  Review of Systems  Review of Systems: A 12 point ROS discussed and pertinent positives are indicated in the HPI above.  All other systems are negative.     Physical Exam No direct physical exam was performed (except for noted visual exam findings with Video Visits).   Vital Signs: There were no vitals taken for this visit.  Imaging:  The following  examinations were reviewed in detail:  Abdominal MRI-02/06/2022; 03/04/2021 CT abdomen and pelvis-03/03/2021; 02/22/2021  Review of abdominal MRI performed 02/06/2022 demonstrates marked progression of now extensive enhancing lesions through the majority of the right lobe of the liver as well as several tiny enhancing lesions within the left lobe of the liver with dominant subcapsular lesion within the anterior aspects of the lateral segment of the left lobe of liver measuring 1.8 cm in diameter open image 38, series 4).    Additionally, bulky adenopathy is demonstrated at the porta hepatis with index porta hepatis lymph node measuring approximately 5.4 x 3.4 cm (image 88, series 14).    The portal vein appears patent.    There is no evidence of splenomegaly or intra-abdominal ascites though several hypertrophied left-sided retroperitoneal venous collaterals as well as distal esophageal varices are identified, similar to abdominal CT performed 02/22/2021.    Resolution of previously noted peripancreatic stranding.  No definitive pancreatic mass or pancreatic ductal dilatation.  MR ABDOMEN MRCP W WO CONTAST  Result Date: 02/08/2022 CLINICAL DATA:  Follow-up liver lesions EXAM: MRI ABDOMEN WITHOUT AND WITH CONTRAST (INCLUDING MRCP) TECHNIQUE: Multiplanar multisequence MR imaging of the abdomen was performed both before and after the administration of intravenous contrast. Heavily T2-weighted images of the biliary and pancreatic ducts were obtained, and three-dimensional MRCP images were rendered by post processing. CONTRAST:  77m EOVIST GADOXETATE DISODIUM 0.25 MOL/L IV SOLN COMPARISON:  03/04/2021. FINDINGS: Lower chest: No acute findings. Hepatobiliary: The liver has a shrunken and nodular appearance compatible with cirrhosis. Arterial phase imaging is a sub white suboptimal due to delayed imaging following the contrast bolus. Within this limitation, there are multiple hyperenhancing liver lesions  noted involving both lobes of liver which are either new or progressive when compared with the previous exam. There are numerous arterial phase enhancing lesions identified throughout both lobes of liver which appear progressive when compared with the previous exam. Although these lesions exhibit restricted diffusion. On today's study enhancing lesions are too numerous to count. Index lesions include: Large confluent mass involving the inferior right lobe of liver measures 13.2 x 7.6 cm, image 58/14. On the previous exam there was an arterial phase enhancing mass within this area measuring 5.6 x 3.9 cm. This meets threshold growth consistent with LI-RADS category 5 lesion. Posterolateral dome of liver lesion exhibits arterial phase enhancement measuring 1.4 cm, image 24/14. New from the previous exam. On the delayed images there is washout within this lesion, suspicious for hepatocellular carcinoma. Large arterial phase enhancing lesion within segment 4 measures 6.1 x 5.5 cm, image 24/15. This also exhibits washout on the delayed images compatible with LI-RADS category 5 lesion. Pancreas: No mass, inflammatory changes, or other parenchymal abnormality identified. Spleen:  Within normal limits in size and appearance. Adrenals/Urinary Tract: Normal adrenal glands. No kidney mass or hydronephrosis identified. Stomach/Bowel: Visualized portions within the abdomen are unremarkable. Vascular/Lymphatic: Normal caliber of the abdominal aorta. 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 Upper abdominal adenopathy is identified. Peripancreatic lymph node measures 1.1 x 1.7 cm, image 20/21. Previously 1.0 x 1.3 cm. Portacaval lymph node measures 3.7 x 1.8 cm, image 22/21. Previously 3.6 x 1.7 cm. Other: No ascites. There are several lesions within the central mesentery in the distribution of previous large pseudo cysts. The largest measures 5.0 x 3.1 cm, image 89/14. This is T1  hyperintense, T2 hypointense without enhancement on the postcontrast images. Findings are favored to represent sequelae of prior hemorrhagic pancreatitis with pseudocyst formation. Musculoskeletal: No suspicious bone lesions identified. IMPRESSION: 1. Morphologic features of the liver compatible with cirrhosis. 2. Extensive enhancing liver lesions are identified throughout both lobes of liver which demonstrate marked progression compared with the previous exam. Enhancing lesions demonstrate washout on the delayed images. In the setting of cirrhosis with progressively elevated alpha fetoprotein levels imaging findings are highly worrisome for multifocal hepatocellular carcinoma. 3. Enlarged upper abdominal lymph nodes identified. Cannot exclude underlying nodal metastasis. 4. There are several lesions within the central mesentery in the distribution of previous large pseudo cysts. The largest measures 5.0 x 3.1 cm. This is favored to represent sequelae of prior hemorrhagic pancreatitis with pseudocyst formation. These results will be called to the ordering clinician or representative by the Radiologist Assistant, and communication documented in the PACS or [employer] Electronically Signed   By: TKerby MoorsM.D.   On: 02/08/2022 05:39    Labs:  CBC: Recent Labs    02/27/21  2505 03/02/21 1027 03/04/21 0452 03/06/21 0701  WBC 9.8 10.7* 9.6 8.8  HGB 11.8* 12.5 11.9* 11.7*  HCT 35.6* 38.0 36.5 35.5*  PLT 152 212 231 287    COAGS: Recent Labs    02/22/21 0412  INR 1.7*   INR - 9/21 - 1.3  BMP: Recent Labs    02/27/21 0531 03/02/21 0456 03/02/21 1027 03/04/21 0452 03/06/21 0701  NA 135  --  134* 134* 135  K 3.6  --  3.9 3.8 4.1  CL 102  --  101 100 102  CO2 28  --  '29 25 27  '$ GLUCOSE 106*  --  96 100* 99  BUN 8  --  '10 9 8  '$ CALCIUM 8.7*  --  9.3 9.0 9.6  CREATININE 1.07* 1.05* 1.04* 1.12* 1.14*  GFRNONAA 58* 59* 60* 55* 53*    LIVER FUNCTION TESTS: Recent Labs     02/22/21 0412 02/23/21 0456 03/02/21 1027 03/06/21 0701  BILITOT 2.7* 2.9* 1.1 1.0  AST 78* 62* 52* 70*  ALT 49* 39 34 39  ALKPHOS 64 62 72 76  PROT 6.6 6.9 7.4 7.3  ALBUMIN 2.8* 2.8* 2.5* 2.5*   Bilirubin - 9/21 - 0.5   TUMOR MARKERS: AFP - 9/21 - 307; 5/25 - 124 CA 19-9 - 9/21 - 816; 5/25 - 190   Assessment and Plan:  Fantashia Shupert is a 66 y.o. 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  The following examinations were reviewed in detail:  Abdominal MRI-02/06/2022; 03/04/2021 CT abdomen and pelvis-03/03/2021; 02/22/2021  Review of abdominal MRI performed 02/06/2022 demonstrates marked progression of now extensive enhancing lesions through the majority of the right lobe of the liver as well as several tiny enhancing lesions within the left lobe of the liver with dominant subcapsular lesion within the anterior aspects of the lateral segment of the left lobe of liver measuring 1.8 cm in diameter open image 38, series 4).  In the setting of cirrhosis, these findings are worrisome for development of multifocal hepatocellular carcinoma, supported by elevation of the patient's AFP level (307 on 9/21, previously, 124 on 5/25)  Bulky adenopathy is demonstrated at the porta hepatis with index porta hepatis lymph node measuring approximately 5.4 x 3.4 cm (image 88, series 14).    The portal vein appears patent.    There is no evidence of splenomegaly or intra-abdominal ascites though several hypertrophied left-sided retroperitoneal venous collaterals as well as distal esophageal varices are identified, similar to abdominal CT performed 02/22/2021.    Resolution of previously noted peripancreatic stranding.  No definitive pancreatic mass or pancreatic ductal dilatation.  This is of importance given patient's elevated CA 19-9 of 816  on 9/21 and 190 on 08/2023.  The advanced progression of her disease since abdominal MRI performed less than 1 year ago and associate poor prognosis was discussed at great length with the patient and the patient's family.  Initially, I feel the patient needs to be evaluated by the oncology service and I would recommend acquisition of a CT scan of the chest, abdomen and pelvis for staging purposes.  I explained that in the setting of cirrhosis and an elevated AFP, often times liver biopsy is not necessary however given her concomitant elevation of a CA 19-9 liver biopsy may be pursued at the discretion of the oncology service.  If patient is ultimately deemed a candidate for  liver directed therapy, given the extent of her disease, I would recommend consideration of Y90 radioembolization.  I explained that Y90 is performed with palliative intent however the hope is to achieve some degree of the disease stability or improvement for some length of time though given rapid progression of patient's disease ultimately I feel the patient's prognosis is poor.  Plan: - Referral to GI oncology service following acquisition of staging CT scan of chest, abdomen and pelvis (with cirrhotic protocol).  IF patient is deemed appropriate for liver directed therapy, will see patient in repeat telemedicine consultation for formal conversation regarding Y-90 radioembolization.  Thank you for this interesting consult.  I greatly enjoyed meeting [facility] and look forward to participating in their care.  A copy of this report was sent to the requesting provider on this date.  Electronically Signed: Sandi Mariscal 02/14/2022, 8:52 AM   I spent a total of 30 Minutes in remote  clinical consultation, greater than 50% of which was counseling/coordinating care for multifocal hepatocellular carcinoma.    Visit type: Audio only (telephone). Audio (no video) only due to patient's lack of internet/smartphone  capability. Alternative for in-person consultation at [facility], [address] [place], [place], [place]. This visit type was conducted due to national recommendations for restrictions regarding the COVID-19 Pandemic (e.g. social distancing).  This format is felt to be most appropriate for this patient at this time.  All issues noted in this document were discussed and addressed.

## 2022-02-14 NOTE — Telephone Encounter (Signed)
Scheduled appt per 10/17 referral. Pt is aware of appt date and time. Pt is aware to arrive 15 mins prior to appt time and to bring and updated insurance card. Pt is aware of appt location.   

## 2022-02-23 ENCOUNTER — Telehealth: Payer: Self-pay | Admitting: Hematology

## 2022-02-23 ENCOUNTER — Inpatient Hospital Stay: Payer: 59 | Admitting: Hematology

## 2022-02-23 DIAGNOSIS — C220 Liver cell carcinoma: Secondary | ICD-10-CM | POA: Insufficient documentation

## 2022-02-23 NOTE — Progress Notes (Incomplete)
[place]   [phone] Fax:(336) [place] Note   Patient Care Team: Arthur Holms, NP as PCP - General (Nurse Practitioner) Sanda Klein, MD as PCP - Cardiology (Cardiology)  Date of Service:  02/23/2022   CHIEF COMPLAINTS/PURPOSE OF CONSULTATION:  Newly Diagnosed Hepatocellular Carcinoma  REFERRING PHYSICIAN:  Dr. Pascal Lux  ASSESSMENT & PLAN:  Connie [name] is a 66 y.o. female with a history of HTN, hepatitis C, alcoholic cirrhosis  1. Hepatocellular carcinoma -she was admitted in 07/2019 for A.fib and RVR and was found to have elevated LFT's and alcohol-associated hepatitis. Abdominal US showed cirrhosis and a 2.5 cm hepatic lesion. She did not f/u after this. -she was admitted again 02/21/21 with acute pancreatitis. AFP and CA 19-9 obtained during hospitalization were both elevated. CT AP and abdomen MRI showed multiple suspicious lesions in right hepatic lobe with underlying cirrhosis. She again did not f/u. -when she saw Roosevelt Locks, NP in 08/2021, her tumor markers showed a further rise (CA 19-9 190, AFP 124). A repeat MRI was ordered but never completed. Tumor markers continued to rise on 01/19/22-- CA 19-9 816, AFP 307. -abdomen MRI finally performed 02/06/22 showing: cirrhosis; extensive liver lesions throughout both lobes with marked progression from prior MRI; enlarged upper abdominal lymph nodes; several pseudo-cysts in central mesentery, largest 5 cm. ***   PLAN:  ***   Oncology History  Hepatocellular carcinoma ([place])  08/02/2019 Imaging   CLINICAL DATA:  Elevated LFTs   EXAM: ULTRASOUND ABDOMEN LIMITED RIGHT UPPER QUADRANT  IMPRESSION: No gallbladder abnormality, cholelithiasis or biliary obstruction   Suboptimal exam as above.  Underlying hepatic cirrhosis suspected.   2.5 cm indeterminate central hypoechoic hepatic lesion. See above comment.   02/22/2021 Imaging   CLINICAL DATA:  Right-sided abdominal  pain.   EXAM: ABDOMEN ULTRASOUND COMPLETE  IMPRESSION: 1. Cirrhosis and small ascites. 2. Gallbladder sludge. No sonographic evidence of acute cholecystitis.   02/22/2021 Imaging   CLINICAL DATA:  Abdominal pain.  Concern for diverticulitis.   EXAM: CT ABDOMEN AND PELVIS WITH CONTRAST  IMPRESSION: 1. Acute pancreatitis. No abscess or pseudocyst. 2. Cirrhosis with small ascites. 3. Colonic diverticulosis. No bowel obstruction. Normal appendix. 4. Aortic Atherosclerosis (ICD10-I70.0).   03/02/2021 Tumor Marker   CA 19-9 = 45 (^) AFP = 11 (^)   03/03/2021 Imaging   CLINICAL DATA:  Pancreatitis, worsening abdominal pain.   EXAM: CT ABDOMEN AND PELVIS WITH CONTRAST  IMPRESSION: 1. Multiple suspicious lesions in the right hepatic lobe. Patient has underlying cirrhosis and findings are concerning for multifocal hepatocellular carcinoma. Metastatic disease is also in the differential diagnosis. Recommend MRI of the abdomen with and without contrast. If the patient cannot tolerate a MRI, consider a multiphase liver CT. 2. Increased inflammatory changes in the upper abdomen compatible with pancreatitis and a large developing pseudocyst. This is developing pseudocyst measures up to 14 cm in size. 3. Cirrhosis with evidence of portal hypertension demonstrated by esophageal varices and varices draining into the IVC. 4. Indeterminate 3 mm pulmonary nodule in the lingula. Management of this pulmonary nodule depends on the etiology of the liver lesions.   03/04/2021 Imaging   EXAM: MRI ABDOMEN WITHOUT AND WITH CONTRAST (INCLUDING MRCP)  IMPRESSION: 1. Multiple enhancing lesions are noted throughout the right lobe of the liver, as detailed above. These have indeterminate imaging characteristics. Given the background of hepatic cirrhosis, the possibility of multifocal infiltrative hepatocellular carcinoma should be considered, although none of these lesions meet definite imaging  criteria to establish that diagnosis. Correlation with AFP level is recommended. Other differential considerations include malignant and benign etiologies such as fibrolamellar hepatocellular carcinoma and multifocal focal nodular hyperplasia ([place]). Consideration for repeat abdominal MRI with and without IV gadolinium (Eovist) is also suggested, although biopsy may ultimately be required to establish a tissue diagnosis. 2. 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 3. Trace volume of perihepatic ascites.   09/22/2021 Tumor Marker   CA 19-9 = 190 (^) AFP = 124 (^)   01/19/2022 Tumor Marker   CA 19-9 = 816 (^) AFP = 307 (^)   02/06/2022 Imaging   EXAM: MRI ABDOMEN WITHOUT AND WITH CONTRAST (INCLUDING MRCP)  IMPRESSION: 1. Morphologic features of the liver compatible with cirrhosis. 2. Extensive enhancing liver lesions are identified throughout both lobes of liver which demonstrate marked progression compared with the previous exam. Enhancing lesions demonstrate washout on the delayed images. In the setting of cirrhosis with progressively elevated alpha fetoprotein levels imaging findings are highly worrisome for multifocal hepatocellular carcinoma. 3. Enlarged upper abdominal lymph nodes identified. Cannot exclude underlying nodal metastasis. 4. There are several lesions within the central mesentery in the distribution of previous large pseudo cysts. The largest measures 5.0 x 3.1 cm. This is favored to represent sequelae of prior hemorrhagic pancreatitis with pseudocyst formation.   02/23/2022 Initial Diagnosis   Hepatocellular carcinoma ([place])      HISTORY OF PRESENTING ILLNESS: *** Connie [name] 66 y.o. female is a here because of [place]. The patient was referred by Dr. Pascal Lux. The patient presents to the clinic today accompanied by ***.   She was admitted in 07/2019 with A.fib and RVR. At that time, she was found to have  elevated LFT's, which prompted additional work up. Testing for HCV antibody was positive with no detectable HCVRNA, HIV was negative. She underwent abdomen US for further work up, and this showed an indeterminate 2.5 cm liver lesion. She was recommended to f/u after discharge, but she never did.  It appears she was not seen again until 02/08/21 when she saw Roosevelt Locks, NP. She was supposed to get a repeat MRI but was instead admitted for acute pancreatitis on 02/21/21. During hospitalization, she was found to have slightly elevated CA 19-9 (45) and AFP (11). Inpatient CT AP on 03/03/21 showed: multiple suspicious right hepatic lobe lesions, concerning for malignancy; 14 cm developing pseudocyst; cirrhosis with portal hypertension. F/u MRI showed the lesions to have indeterminate imaging characteristics. She was again lost to f/u after hospital discharge.  She returned to [facility], NP in 08/2021, and her tumor markers were further elevated-- CA 19-9 at 190 and AFP at 124. A repeat MRI was ordered, but she never completed this in part due to insurance issues. At f/u on 01/19/22, the tumor markers again showed a significant rise-- CA 19-9 was 816 and AFP was 307. She finally completed abdomen MRI on 02/06/22 showing: cirrhosis; extensive enhancing liver lesions throughout both liver lobes, demonstrating marked progression compared to prior MRI, still worrisome for multifocal HCC; enlarged upper abdominal lymph nodes, cannot exclude metastasis; several lesions within central mesentery, favored to represent sequelae of prior hemorrhagic pancreatitis with pseudocyst formation, largest if 5 cm.   Today the patient notes they felt/feeling prior/after...   She has a PMHx of.... -HTN -A.fib -CKD, stage 3 -GERD  Socially... -history of heavy alcohol use-- period of 4-5 years where she was drinking a pint of hard liquor every other day.  She has been cutting back over the last two years, currently only consuming  alcohol about once a month*** -retired from working for [employer] -recently married her fiance, in 03/2021.   REVIEW OF SYSTEMS:   *** Constitutional: Denies fevers, chills or abnormal night sweats Eyes: Denies blurriness of vision, double vision or watery eyes Ears, nose, mouth, throat, and face: Denies mucositis or sore throat Respiratory: Denies cough, dyspnea or wheezes Cardiovascular: Denies palpitation, chest discomfort or lower extremity swelling Gastrointestinal:  Denies nausea, heartburn or change in bowel habits Skin: Denies abnormal skin rashes Lymphatics: Denies new lymphadenopathy or easy bruising Neurological:Denies numbness, tingling or new weaknesses Behavioral/Psych: Mood is stable, no new changes  All other systems were reviewed with the patient and are negative.   MEDICAL HISTORY:  Past Medical History:  Diagnosis Date   Alcohol abuse    Helicobacter pylori gastritis 08/07/2019   Hepatitis C antibody positive in blood - negative RNA    Hypertension     SURGICAL HISTORY: Past Surgical History:  Procedure Laterality Date   BIOPSY  08/03/2019   Procedure: BIOPSY;  Surgeon: Gatha Mayer, MD;  Location: [facility] ENDOSCOPY;  Service: Endoscopy;;   ESOPHAGOGASTRODUODENOSCOPY (EGD) WITH PROPOFOL N/A 08/03/2019   Procedure: ESOPHAGOGASTRODUODENOSCOPY (EGD) WITH PROPOFOL;  Surgeon: Gatha Mayer, MD;  Location: [facility] ENDOSCOPY;  Service: Endoscopy;  Laterality: N/A;   IR RADIOLOGIST EVAL & MGMT  02/14/2022    SOCIAL HISTORY: Social History   Socioeconomic History   Marital status: Unknown    Spouse name: Not on file   Number of children: Not on file   Years of education: Not on file   Highest education level: Not on file  Occupational History   Not on file  Tobacco Use   Smoking status: Not on file   Smokeless tobacco: Not on file  Substance and Sexual Activity   Alcohol use: Not Currently   Drug use: Never   Sexual activity: Not on file  Other Topics Concern    Not on file  Social History Narrative   ** Merged History Encounter **       Married Lives in [place] Daughter in [place], Smoker   Social Determinants of Health   Financial Resource Strain: Not on file  Food Insecurity: Not on file  Transportation Needs: Not on file  Physical Activity: Not on file  Stress: Not on file  Social Connections: Not on file  Intimate Partner Violence: Not on file    FAMILY HISTORY: Family History  Problem Relation Age of Onset   Lung cancer Mother     ALLERGIES:  has No Known Allergies.  MEDICATIONS:  Current Outpatient Medications  Medication Sig Dispense Refill   acetaminophen (TYLENOL) 500 MG tablet Take 1,000 mg by mouth every 6 (six) hours as needed for mild pain.     calcium carbonate (TUMS - DOSED IN MG ELEMENTAL CALCIUM) 500 MG chewable tablet Chew 1 tablet by mouth daily as needed for indigestion or heartburn.     co-enzyme Q-10 30 MG capsule Take 30 mg by mouth 3 (three) times a week.     diclofenac Sodium (VOLTAREN) 1 % GEL Apply 2 g topically 2 (two) times daily as needed (pain).     docusate sodium (COLACE) 100 MG capsule Take 1 capsule (100 mg total) by mouth 2 (two) times daily. 30 capsule 0   gabapentin (NEURONTIN) 100 MG capsule Take 1 capsule (100 mg total) by mouth 3 (three) times daily.  90 capsule 0   HYDROcodone-acetaminophen (NORCO/VICODIN) 5-325 MG tablet Take 1 tablet by mouth every 6 (six) hours as needed for moderate pain or severe pain. 20 tablet 0   lipase/protease/amylase (CREON) 12000-38000 units CPEP capsule Take 1 capsule (12,000 Units total) by mouth 3 (three) times daily with meals. 270 capsule 0   metoprolol succinate (TOPROL-XL) 25 MG 24 hr tablet Take 1 tablet (25 mg total) by mouth daily. 30 tablet 1   nicotine (NICODERM CQ - DOSED IN MG/24 HOURS) 14 mg/24hr patch Place 1 patch (14 mg total) onto the skin daily as needed (smoking cessation). 28 patch 0   Omega-3 Fatty Acids (FISH OIL) 1000 MG  CAPS Take 1,000 mg by mouth daily.     pantoprazole (PROTONIX) 40 MG tablet Take 1 tablet (40 mg total) by mouth 2 (two) times daily. (Patient taking differently: Take 40 mg by mouth daily.) 60 tablet 1   polyethylene glycol (MIRALAX / GLYCOLAX) 17 g packet Take 17 g by mouth 2 (two) times daily. 14 each 0   No current facility-administered medications for this visit.    PHYSICAL EXAMINATION: ECOG PERFORMANCE STATUS: {CHL ONC ECOG [phone]}  There were no vitals filed for this visit. There were no vitals filed for this visit. *** GENERAL:alert, no distress and comfortable SKIN: skin color, texture, turgor are normal, no rashes or significant lesions EYES: normal, Conjunctiva are pink and non-injected, sclera clear {OROPHARYNX:no exudate, no erythema and lips, buccal mucosa, and tongue normal}  NECK: supple, thyroid normal size, non-tender, without nodularity LYMPH:  no palpable lymphadenopathy in the cervical, axillary {or inguinal} LUNGS: clear to auscultation and percussion with normal breathing effort HEART: regular rate & rhythm and no murmurs and no lower extremity edema ABDOMEN:abdomen soft, non-tender and normal bowel sounds Musculoskeletal:no cyanosis of digits and no clubbing  NEURO: alert & oriented x 3 with fluent speech, no focal motor/sensory deficits  LABORATORY DATA:  I have reviewed the data as listed    Latest Ref Rng & Units 03/06/2021    7:01 AM 03/04/2021    4:52 AM 03/02/2021   10:27 AM  CBC  WBC 4.0 - 10.5 K/uL 8.8  9.6  10.7   Hemoglobin 12.0 - 15.0 g/dL 11.7  11.9  12.5   Hematocrit 36.0 - 46.0 % 35.5  36.5  38.0   Platelets 150 - 400 K/uL 287  231  212        Latest Ref Rng & Units 03/06/2021    7:01 AM 03/04/2021    4:52 AM 03/02/2021   10:27 AM  CMP  Glucose 70 - 99 mg/dL 99  100  96   BUN 8 - 23 mg/dL '8  9  10   '$ Creatinine 0.44 - 1.00 mg/dL 1.14  1.12  1.04   Sodium 135 - 145 mmol/L 135  134  134   Potassium 3.5 - 5.1 mmol/L 4.1  3.8  3.9    Chloride 98 - 111 mmol/L 102  100  101   CO2 22 - 32 mmol/L '27  25  29   '$ Calcium 8.9 - 10.3 mg/dL 9.6  9.0  9.3   Total Protein 6.5 - 8.1 g/dL 7.3   7.4   Total Bilirubin 0.3 - 1.2 mg/dL 1.0   1.1   Alkaline Phos 38 - 126 U/L 76   72   AST 15 - 41 U/L 70   52   ALT 0 - 44 U/L 39   34  RADIOGRAPHIC STUDIES: I have personally reviewed the radiological images as listed and agreed with the findings in the report. IR Radiologist Eval & Mgmt  Result Date: 02/14/2022 Please refer to notes tab for details about interventional procedure. (Op Note)  MR ABDOMEN MRCP W WO CONTAST  Result Date: 02/08/2022 CLINICAL DATA:  Follow-up liver lesions EXAM: MRI ABDOMEN WITHOUT AND WITH CONTRAST (INCLUDING MRCP) TECHNIQUE: Multiplanar multisequence MR imaging of the abdomen was performed both before and after the administration of intravenous contrast. Heavily T2-weighted images of the biliary and pancreatic ducts were obtained, and three-dimensional MRCP images were rendered by post processing. CONTRAST:  69m EOVIST GADOXETATE DISODIUM 0.25 MOL/L IV SOLN COMPARISON:  03/04/2021. FINDINGS: Lower chest: No acute findings. Hepatobiliary: The liver has a shrunken and nodular appearance compatible with cirrhosis. Arterial phase imaging is a sub white suboptimal due to delayed imaging following the contrast bolus. Within this limitation, there are multiple hyperenhancing liver lesions noted involving both lobes of liver which are either new or progressive when compared with the previous exam. There are numerous arterial phase enhancing lesions identified throughout both lobes of liver which appear progressive when compared with the previous exam. Although these lesions exhibit restricted diffusion. On today's study enhancing lesions are too numerous to count. Index lesions include: Large confluent mass involving the inferior right lobe of liver measures 13.2 x 7.6 cm, image 58/14. On the previous exam there was an  arterial phase enhancing mass within this area measuring 5.6 x 3.9 cm. This meets threshold growth consistent with LI-RADS category 5 lesion. Posterolateral dome of liver lesion exhibits arterial phase enhancement measuring 1.4 cm, image 24/14. New from the previous exam. On the delayed images there is washout within this lesion, suspicious for hepatocellular carcinoma. Large arterial phase enhancing lesion within segment 4 measures 6.1 x 5.5 cm, image 24/15. This also exhibits washout on the delayed images compatible with LI-RADS category 5 lesion. Pancreas: No mass, inflammatory changes, or other parenchymal abnormality identified. Spleen:  Within normal limits in size and appearance. Adrenals/Urinary Tract: Normal adrenal glands. No kidney mass or hydronephrosis identified. Stomach/Bowel: Visualized portions within the abdomen are unremarkable. Vascular/Lymphatic: Normal caliber of the abdominal aorta. 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 Upper abdominal adenopathy is identified. Peripancreatic lymph node measures 1.1 x 1.7 cm, image 20/21. Previously 1.0 x 1.3 cm. Portacaval lymph node measures 3.7 x 1.8 cm, image 22/21. Previously 3.6 x 1.7 cm. Other: No ascites. There are several lesions within the central mesentery in the distribution of previous large pseudo cysts. The largest measures 5.0 x 3.1 cm, image 89/14. This is T1 hyperintense, T2 hypointense without enhancement on the postcontrast images. Findings are favored to represent sequelae of prior hemorrhagic pancreatitis with pseudocyst formation. Musculoskeletal: No suspicious bone lesions identified. IMPRESSION: 1. Morphologic features of the liver compatible with cirrhosis. 2. Extensive enhancing liver lesions are identified throughout both lobes of liver which demonstrate marked progression compared with the previous exam. Enhancing lesions demonstrate washout on the delayed images. In the setting of  cirrhosis with progressively elevated alpha fetoprotein levels imaging findings are highly worrisome for multifocal hepatocellular carcinoma. 3. Enlarged upper abdominal lymph nodes identified. Cannot exclude underlying nodal metastasis. 4. There are several lesions within the central mesentery in the distribution of previous large pseudo cysts. The largest measures 5.0 x 3.1 cm. This is favored to represent sequelae of prior hemorrhagic pancreatitis with pseudocyst formation. These results will be called to the ordering clinician  or representative by the Radiologist Assistant, and communication documented in the PACS or [employer]. Electronically Signed   By: Kerby Moors M.D.   On: 02/08/2022 05:39     No orders of the defined types were placed in this encounter.   All questions were answered. The patient knows to call the clinic with any problems, questions or concerns. The total time spent in the appointment was {CHL ONC TIME VISIT - VQOHC:[id number]}.     Aurea Graff 02/23/2022 10:48 AM  I, Wilburn Mylar, am acting as scribe for Truitt Merle, MD.   {Add scribe attestation statement}

## 2022-02-23 NOTE — Telephone Encounter (Signed)
Pt's appt was cancelled per 10/26 sch msg. I called pt back to see if she would like to r/s. No answer. Left msg for pt to call back if interested in r/s appt.

## 2022-02-28 ENCOUNTER — Telehealth: Payer: Self-pay | Admitting: *Deleted

## 2022-02-28 ENCOUNTER — Telehealth: Payer: Self-pay | Admitting: Hematology

## 2022-02-28 ENCOUNTER — Ambulatory Visit (HOSPITAL_COMMUNITY): Payer: 59

## 2022-02-28 NOTE — Telephone Encounter (Signed)
R/s pt's new pt appt, per pt request. Pt is aware of new appt date/time.

## 2022-02-28 NOTE — Telephone Encounter (Signed)
Per Dr Pascal Lux he would still like Ms. Bayer to see Dr. Burr Medico. I have lft a msg for Bethany to reschedule Ms. Marban./vm

## 2022-03-02 ENCOUNTER — Telehealth: Payer: 59

## 2022-03-06 ENCOUNTER — Inpatient Hospital Stay: Payer: 59 | Attending: Hematology | Admitting: Hematology

## 2022-03-06 ENCOUNTER — Other Ambulatory Visit: Payer: 59

## 2022-03-06 ENCOUNTER — Encounter: Payer: Self-pay | Admitting: Hematology

## 2022-03-06 ENCOUNTER — Other Ambulatory Visit: Payer: Self-pay

## 2022-03-06 VITALS — BP 150/82 | HR 73 | Temp 97.8°F | Resp 18 | Ht 65.0 in | Wt 232.2 lb

## 2022-03-06 DIAGNOSIS — Z79899 Other long term (current) drug therapy: Secondary | ICD-10-CM | POA: Diagnosis not present

## 2022-03-06 DIAGNOSIS — N183 Chronic kidney disease, stage 3 unspecified: Secondary | ICD-10-CM | POA: Insufficient documentation

## 2022-03-06 DIAGNOSIS — B1920 Unspecified viral hepatitis C without hepatic coma: Secondary | ICD-10-CM | POA: Diagnosis not present

## 2022-03-06 DIAGNOSIS — K5730 Diverticulosis of large intestine without perforation or abscess without bleeding: Secondary | ICD-10-CM | POA: Insufficient documentation

## 2022-03-06 DIAGNOSIS — C220 Liver cell carcinoma: Secondary | ICD-10-CM | POA: Insufficient documentation

## 2022-03-06 DIAGNOSIS — K7031 Alcoholic cirrhosis of liver with ascites: Secondary | ICD-10-CM | POA: Diagnosis not present

## 2022-03-06 DIAGNOSIS — K219 Gastro-esophageal reflux disease without esophagitis: Secondary | ICD-10-CM | POA: Insufficient documentation

## 2022-03-06 DIAGNOSIS — R590 Localized enlarged lymph nodes: Secondary | ICD-10-CM | POA: Diagnosis not present

## 2022-03-06 DIAGNOSIS — I700 Atherosclerosis of aorta: Secondary | ICD-10-CM | POA: Diagnosis not present

## 2022-03-06 DIAGNOSIS — K7030 Alcoholic cirrhosis of liver without ascites: Secondary | ICD-10-CM | POA: Insufficient documentation

## 2022-03-06 DIAGNOSIS — R911 Solitary pulmonary nodule: Secondary | ICD-10-CM | POA: Insufficient documentation

## 2022-03-06 DIAGNOSIS — F17210 Nicotine dependence, cigarettes, uncomplicated: Secondary | ICD-10-CM | POA: Diagnosis not present

## 2022-03-06 DIAGNOSIS — Z791 Long term (current) use of non-steroidal anti-inflammatories (NSAID): Secondary | ICD-10-CM | POA: Insufficient documentation

## 2022-03-06 DIAGNOSIS — K859 Acute pancreatitis without necrosis or infection, unspecified: Secondary | ICD-10-CM | POA: Diagnosis not present

## 2022-03-06 DIAGNOSIS — I129 Hypertensive chronic kidney disease with stage 1 through stage 4 chronic kidney disease, or unspecified chronic kidney disease: Secondary | ICD-10-CM | POA: Diagnosis not present

## 2022-03-06 DIAGNOSIS — Z5112 Encounter for antineoplastic immunotherapy: Secondary | ICD-10-CM | POA: Diagnosis not present

## 2022-03-06 DIAGNOSIS — K766 Portal hypertension: Secondary | ICD-10-CM | POA: Insufficient documentation

## 2022-03-06 DIAGNOSIS — I8510 Secondary esophageal varices without bleeding: Secondary | ICD-10-CM | POA: Diagnosis not present

## 2022-03-06 DIAGNOSIS — C221 Intrahepatic bile duct carcinoma: Secondary | ICD-10-CM | POA: Diagnosis present

## 2022-03-06 NOTE — Progress Notes (Signed)
START OFF PATHWAY REGIMEN - Other   [id number]:Durvalumab 1,500 mg IV D1 + Tremelimumab 300 mg IV D1 q28 Days x 1 Cycle Followed by Durvalumab 1,500 mg IV D1 q28 Days:   Cycle 1: A cycle is 28 days:     Tremelimumab-actl      Durvalumab    Cycles 2 and beyond: A cycle is every 28 days:     Durvalumab   **Always confirm dose/schedule in your pharmacy ordering system**  Patient Characteristics: Intent of Therapy: Non-Curative / Palliative Intent, Discussed with Patient

## 2022-03-06 NOTE — Progress Notes (Addendum)
[facility]   [phone] Fax:([phone] [place] Note   Patient Care Team: Arthur Holms, NP as PCP - General (Nurse Practitioner) Sanda Klein, MD as PCP - Cardiology (Cardiology) Roosevelt Locks, CRNP as Nurse Practitioner (Transplant Hepatology) Sandi Mariscal, MD as Consulting Physician (Interventional Radiology) Truitt Merle, MD as Consulting Physician (Hematology and Oncology)  Date of Service:  03/06/2022   CHIEF COMPLAINTS/PURPOSE OF CONSULTATION:  Newly Diagnosed Hepatocellular Carcinoma   REFERRING PHYSICIAN:  Dr. Pascal Lux  ASSESSMENT & PLAN:  Connie [name] is a 66 y.o. female with a history of HTN, hepatitis C, alcoholic cirrhosis   1. Hepatocellular carcinoma with diffuse liver metastasis and abdominal adenopathy -she was admitted in 07/2019 for A.fib and RVR and was found to have elevated LFT's and alcohol-associated hepatitis. Abdominal US showed cirrhosis and a 2.5 cm hepatic lesion. She did not f/u after this. -she was admitted again 02/21/21 with acute pancreatitis. AFP and CA 19-9 obtained during hospitalization were both elevated. CT AP and abdomen MRI showed multiple suspicious lesions in right hepatic lobe with underlying cirrhosis. She again did not f/u after discharge. -when she saw Roosevelt Locks, NP in 08/2021, her tumor markers showed a further rise (CA 19-9 190, AFP 124). A repeat MRI was ordered but never completed. Tumor markers continued to rise on 01/19/22-- CA 19-9 816, AFP 307. -abdomen MRI finally performed 02/06/22 showing: cirrhosis; extensive liver lesions throughout both lobes with marked progression from prior MRI; enlarged upper abdominal lymph nodes; several pseudo-cysts in central mesentery, largest 5 cm. --I reviewed the work up and MRI images with pt and her husband today. I explained that her MRI findings and significantly elevated AFP makes the diagnosis of liver cancer pretty certain.  However she also has  significantly elevated CA 19.9, I do recommend liver biopsy to rule out component of cholangiocarcinoma.  -I discussed that given the diffuse disease, she is not eligible for surgical resection or transplant.  -I reviewed different treatment options, including Y90 radioembolization and systemic treatment with immunotherapy, or immunotherapy with bevacizumab, or TKI, according to NCCN guideline.  -I recommend obtaining new baseline labs, including tumor markers AFP and CA 19-9 today -she is scheduled for staging CT CAP on 03/09/22 and f/u with Dr. Pascal Lux on 03/14/22. -will refer her back to GI Dr. Carlean Purl for endoscopy monitoring for her liver cirrhosis  -I plan to start her on Durvaluamb every 4 weeks, and Tremelimumab '300mg'$  once on day 1 of cycle 1 --Chemotherapy consent: Side effects including but does not not limited to, fatigue, pneumonitis, colitis, hepatitis, pancreatitis, thyroid and other endocrine dysfunction, rash, arthralgia, neuro toxicities,  etc. were discussed with patient in great detail.  We discussed that most side effect of mild and manageable, but severe and life-threatening side effect could happen also.  She voiced good understanding and agrees to proceed. -the goal of therapy is palliative to prolong her life -Given her diffuse liver metastasis, I think she will benefit from radioembolization.  I will communicate with Dr. Pascal Lux   2.  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 Pascal Lux 11/14   Oncology History  Hepatocellular carcinoma ([place])  08/02/2019 Imaging   CLINICAL DATA:  Elevated LFTs   EXAM: ULTRASOUND ABDOMEN LIMITED RIGHT UPPER QUADRANT  IMPRESSION: No gallbladder abnormality, cholelithiasis or biliary obstruction  Suboptimal exam as above.  Underlying hepatic cirrhosis suspected.   2.5 cm indeterminate central  hypoechoic hepatic lesion. See above comment.   02/22/2021 Imaging   CLINICAL DATA:  Right-sided abdominal pain.   EXAM: ABDOMEN ULTRASOUND COMPLETE  IMPRESSION: 1. Cirrhosis and small ascites. 2. Gallbladder sludge. No sonographic evidence of acute cholecystitis.   02/22/2021 Imaging   CLINICAL DATA:  Abdominal pain.  Concern for diverticulitis.   EXAM: CT ABDOMEN AND PELVIS WITH CONTRAST  IMPRESSION: 1. Acute pancreatitis. No abscess or pseudocyst. 2. Cirrhosis with small ascites. 3. Colonic diverticulosis. No bowel obstruction. Normal appendix. 4. Aortic Atherosclerosis (ICD10-I70.0).   03/02/2021 Tumor Marker   CA 19-9 = 45 (^) AFP = 11 (^)   03/03/2021 Imaging   CLINICAL DATA:  Pancreatitis, worsening abdominal pain.   EXAM: CT ABDOMEN AND PELVIS WITH CONTRAST  IMPRESSION: 1. Multiple suspicious lesions in the right hepatic lobe. Patient has underlying cirrhosis and findings are concerning for multifocal hepatocellular carcinoma. Metastatic disease is also in the differential diagnosis. Recommend MRI of the abdomen with and without contrast. If the patient cannot tolerate a MRI, consider a multiphase liver CT. 2. Increased inflammatory changes in the upper abdomen compatible with pancreatitis and a large developing pseudocyst. This is developing pseudocyst measures up to 14 cm in size. 3. Cirrhosis with evidence of portal hypertension demonstrated by esophageal varices and varices draining into the IVC. 4. Indeterminate 3 mm pulmonary nodule in the lingula. Management of this pulmonary nodule depends on the etiology of the liver lesions.   03/04/2021 Imaging   EXAM: MRI ABDOMEN WITHOUT AND WITH CONTRAST (INCLUDING MRCP)  IMPRESSION: 1. Multiple enhancing lesions are noted throughout the right lobe of the liver, as detailed above. These have indeterminate imaging characteristics. Given the background of hepatic cirrhosis, the possibility of multifocal  infiltrative hepatocellular carcinoma should be considered, although none of these lesions meet definite imaging criteria to establish that diagnosis. Correlation with AFP level is recommended. Other differential considerations include malignant and benign etiologies such as fibrolamellar hepatocellular carcinoma and multifocal focal nodular hyperplasia ([place]). Consideration for repeat abdominal MRI with and without IV gadolinium (Eovist) is also suggested, although biopsy may ultimately be required to establish a tissue diagnosis. 2. 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 3. Trace volume of perihepatic ascites.   09/22/2021 Tumor Marker   CA 19-9 = 190 (^) AFP = 124 (^)   01/19/2022 Tumor Marker   CA 19-9 = 816 (^) AFP = 307 (^)   02/06/2022 Imaging   EXAM: MRI ABDOMEN WITHOUT AND WITH CONTRAST (INCLUDING MRCP)  IMPRESSION: 1. Morphologic features of the liver compatible with cirrhosis. 2. Extensive enhancing liver lesions are identified throughout both lobes of liver which demonstrate marked progression compared with the previous exam. Enhancing lesions demonstrate washout on the delayed images. In the setting of cirrhosis with progressively elevated alpha fetoprotein levels imaging findings are highly worrisome for multifocal hepatocellular carcinoma. 3. Enlarged upper abdominal lymph nodes identified. Cannot exclude underlying nodal metastasis. 4. There are several lesions within the central mesentery in the distribution of previous large pseudo cysts. The largest measures 5.0 x 3.1 cm. This is favored to represent sequelae of prior hemorrhagic pancreatitis with pseudocyst formation.   02/23/2022 Initial Diagnosis   Hepatocellular carcinoma (HCC)      HISTORY OF PRESENTING ILLNESS:  Connie [name] 66 y.o. female is a here because of [place]. The patient was referred by Dr. Pascal Lux. The patient presents to the  clinic  today accompanied by her husband.   She was admitted in 07/2019 with A.fib and RVR. At that time, she was found to have elevated LFT's, which prompted additional work up. Testing for HCV antibody was positive with no detectable HCVRNA, HIV was negative. She underwent abdomen US for further work up, and this showed an indeterminate 2.5 cm liver lesion. She was recommended to f/u after discharge, but she never did.   It appears she was not seen again until 02/08/21 when she saw Roosevelt Locks, NP. She was supposed to get a repeat MRI but was instead admitted for acute pancreatitis on 02/21/21. During hospitalization, she was found to have slightly elevated CA 19-9 (45) and AFP (11). Inpatient CT AP on 03/03/21 showed: multiple suspicious right hepatic lobe lesions, concerning for malignancy; 14 cm developing pseudocyst; cirrhosis with portal hypertension. F/u MRI showed the lesions to have indeterminate imaging characteristics. She was again lost to f/u after hospital discharge.   She returned to [facility], NP in 08/2021, and her tumor markers were further elevated-- CA 19-9 at 190 and AFP at 124. A repeat MRI was ordered, but she never completed this in part due to insurance issues. At f/u on 01/19/22, the tumor markers again showed a significant rise-- CA 19-9 was 816 and AFP was 307. She finally completed abdomen MRI on 02/06/22 showing: cirrhosis; extensive enhancing liver lesions throughout both liver lobes, demonstrating marked progression compared to prior MRI, still worrisome for multifocal HCC; enlarged upper abdominal lymph nodes, cannot exclude metastasis; several lesions within central mesentery, favored to represent sequelae of prior hemorrhagic pancreatitis with pseudocyst formation, largest if 5 cm.   Today the patient notes they felt/feeling prior/after... -intermittent pain to right abdomen  She has a PMHx of.... -hepatitis C, not currently active -HTN -A.fib -CKD, stage 3 -GERD    Socially... -history of heavy alcohol use-- period of 4-5 years where she was drinking a pint of hard liquor every other day. She has been cutting back over the last two years. Today (03/06/22), she reports she quit a year ago. -retired from working for [employer] -recently married her fiance, in 03/2021. -she has 3 children that live nearby -current smoker for last 50 years, currently 0.5 ppd. She reports difficulty quitting.   REVIEW OF SYSTEMS:    Constitutional: Denies fevers, chills or abnormal night sweats Eyes: Denies blurriness of vision, double vision or watery eyes Ears, nose, mouth, throat, and face: Denies mucositis or sore throat Respiratory: Denies cough, dyspnea or wheezes Cardiovascular: Denies palpitation, chest discomfort or lower extremity swelling Gastrointestinal:  Denies nausea, heartburn or change in bowel habits Skin: Denies abnormal skin rashes Lymphatics: Denies new lymphadenopathy or easy bruising Neurological:Denies numbness, tingling or new weaknesses Behavioral/Psych: Mood is stable, no new changes  All other systems were reviewed with the patient and are negative.   MEDICAL HISTORY:  Past Medical History:  Diagnosis Date   Alcohol abuse    Helicobacter pylori gastritis 08/07/2019   Hepatitis C antibody positive in blood - negative RNA    Hypertension     SURGICAL HISTORY: Past Surgical History:  Procedure Laterality Date   BIOPSY  08/03/2019   Procedure: BIOPSY;  Surgeon: Gatha Mayer, MD;  Location: [facility] ENDOSCOPY;  Service: Endoscopy;;   ESOPHAGOGASTRODUODENOSCOPY (EGD) WITH PROPOFOL N/A 08/03/2019   Procedure: ESOPHAGOGASTRODUODENOSCOPY (EGD) WITH PROPOFOL;  Surgeon: Gatha Mayer, MD;  Location: [facility] ENDOSCOPY;  Service: Endoscopy;  Laterality: N/A;   IR RADIOLOGIST EVAL & MGMT  02/14/2022    SOCIAL HISTORY: Social History   Socioeconomic History   Marital status: Married    Spouse name: Not on file   Number of children: 3   Years of  education: Not on file   Highest education level: Not on file  Occupational History   Not on file  Tobacco Use   Smoking status: Every Day    Packs/day: 0.50    Years: 50.00    Total pack years: 25.00    Types: Cigarettes   Smokeless tobacco: Not on file  Substance and Sexual Activity   Alcohol use: Not Currently    Comment: she used to drink alcohol heavily, stopped in 01/2021   Drug use: Never   Sexual activity: Not on file  Other Topics Concern   Not on file  Social History Narrative   ** Merged History Encounter ** Married   Use to live in [place], moved to [place] in 09/2018   Daughter in [place]   Works [employer]   +EtOH, Smoker   Social Determinants of Health   Financial Resource Strain: Not on file  Food Insecurity: Not on file  Transportation Needs: Not on file  Physical Activity: Not on file  Stress: Not on file  Social Connections: Not on file  Intimate Partner Violence: Not on file    FAMILY HISTORY: Family History  Problem Relation Age of Onset   Lung cancer Mother     ALLERGIES:  has No Known Allergies.  MEDICATIONS:  Current Outpatient Medications  Medication Sig Dispense Refill   acetaminophen (TYLENOL) 500 MG tablet Take 1,000 mg by mouth every 6 (six) hours as needed for mild pain.     co-enzyme Q-10 30 MG capsule Take 30 mg by mouth 3 (three) times a week.     diclofenac Sodium (VOLTAREN) 1 % GEL Apply 2 g topically 2 (two) times daily as needed (pain).     docusate sodium (COLACE) 100 MG capsule Take 1 capsule (100 mg total) by mouth 2 (two) times daily. 30 capsule 0   gabapentin (NEURONTIN) 100 MG capsule Take 1 capsule (100 mg total) by mouth 3 (three) times daily. 90 capsule 0   lipase/protease/amylase (CREON) 12000-38000 units CPEP capsule Take 1 capsule (12,000 Units total) by mouth 3 (three) times daily with meals. 270 capsule 0   metoprolol succinate (TOPROL-XL) 25 MG 24 hr tablet Take 1 tablet (25 mg total) by mouth daily. 30 tablet 1    nicotine (NICODERM CQ - DOSED IN MG/24 HOURS) 14 mg/24hr patch Place 1 patch (14 mg total) onto the skin daily as needed (smoking cessation). 28 patch 0   Omega-3 Fatty Acids (FISH OIL) 1000 MG CAPS Take 1,000 mg by mouth daily.     pantoprazole (PROTONIX) 40 MG tablet Take 1 tablet (40 mg total) by mouth 2 (two) times daily. (Patient taking differently: Take 40 mg by mouth daily.) 60 tablet 1   polyethylene glycol (MIRALAX / GLYCOLAX) 17 g packet Take 17 g by mouth 2 (two) times daily. 14 each 0   No current facility-administered medications for this visit.    PHYSICAL EXAMINATION: ECOG PERFORMANCE STATUS: 0 - Asymptomatic  Vitals:   03/06/22 1506  BP: (!) 150/82  Pulse: 73  Resp: 18  Temp: 97.8 F (36.6 C)  SpO2: 98%   Filed Weights   03/06/22 1506  Weight: 232 lb 3.2 oz (105.3 kg)    GENERAL:alert, no distress and comfortable SKIN: skin color, texture, turgor are normal,  no rashes or significant lesions EYES: normal, Conjunctiva are pink and non-injected, sclera clear  NECK: supple, thyroid normal size, non-tender, without nodularity LYMPH:  no palpable lymphadenopathy in the cervical, axillary  LUNGS: clear to auscultation and percussion with normal breathing effort HEART: regular rate & rhythm and no murmurs and no lower extremity edema ABDOMEN:abdomen soft, non-tender and normal bowel sounds Musculoskeletal:no cyanosis of digits and no clubbing  NEURO: alert & oriented x 3 with fluent speech, no focal motor/sensory deficits  LABORATORY DATA:  I have reviewed the data as listed    Latest Ref Rng & Units 03/06/2021    7:01 AM 03/04/2021    4:52 AM 03/02/2021   10:27 AM  CBC  WBC 4.0 - 10.5 K/uL 8.8  9.6  10.7   Hemoglobin 12.0 - 15.0 g/dL 11.7  11.9  12.5   Hematocrit 36.0 - 46.0 % 35.5  36.5  38.0   Platelets 150 - 400 K/uL 287  231  212        Latest Ref Rng & Units 03/06/2021    7:01 AM 03/04/2021    4:52 AM 03/02/2021   10:27 AM  CMP  Glucose 70 - 99 mg/dL 99   100  96   BUN 8 - 23 mg/dL '8  9  10   '$ Creatinine 0.44 - 1.00 mg/dL 1.14  1.12  1.04   Sodium 135 - 145 mmol/L 135  134  134   Potassium 3.5 - 5.1 mmol/L 4.1  3.8  3.9   Chloride 98 - 111 mmol/L 102  100  101   CO2 22 - 32 mmol/L '27  25  29   '$ Calcium 8.9 - 10.3 mg/dL 9.6  9.0  9.3   Total Protein 6.5 - 8.1 g/dL 7.3   7.4   Total Bilirubin 0.3 - 1.2 mg/dL 1.0   1.1   Alkaline Phos 38 - 126 U/L 76   72   AST 15 - 41 U/L 70   52   ALT 0 - 44 U/L 39   34      RADIOGRAPHIC STUDIES: I have personally reviewed the radiological images as listed and agreed with the findings in the report. IR Radiologist Eval & Mgmt  Result Date: 02/14/2022 Please refer to notes tab for details about interventional procedure. (Op Note)  MR ABDOMEN MRCP W WO CONTAST  Result Date: 02/08/2022 CLINICAL DATA:  Follow-up liver lesions EXAM: MRI ABDOMEN WITHOUT AND WITH CONTRAST (INCLUDING MRCP) TECHNIQUE: Multiplanar multisequence MR imaging of the abdomen was performed both before and after the administration of intravenous contrast. Heavily T2-weighted images of the biliary and pancreatic ducts were obtained, and three-dimensional MRCP images were rendered by post processing. CONTRAST:  75m EOVIST GADOXETATE DISODIUM 0.25 MOL/L IV SOLN COMPARISON:  03/04/2021. FINDINGS: Lower chest: No acute findings. Hepatobiliary: The liver has a shrunken and nodular appearance compatible with cirrhosis. Arterial phase imaging is a sub white suboptimal due to delayed imaging following the contrast bolus. Within this limitation, there are multiple hyperenhancing liver lesions noted involving both lobes of liver which are either new or progressive when compared with the previous exam. There are numerous arterial phase enhancing lesions identified throughout both lobes of liver which appear progressive when compared with the previous exam. Although these lesions exhibit restricted diffusion. On today's study enhancing lesions are too  numerous to count. Index lesions include: Large confluent mass involving the inferior right lobe of liver measures 13.2 x 7.6 cm, image 58/14. On the previous  exam there was an arterial phase enhancing mass within this area measuring 5.6 x 3.9 cm. This meets threshold growth consistent with LI-RADS category 5 lesion. Posterolateral dome of liver lesion exhibits arterial phase enhancement measuring 1.4 cm, image 24/14. New from the previous exam. On the delayed images there is washout within this lesion, suspicious for hepatocellular carcinoma. Large arterial phase enhancing lesion within segment 4 measures 6.1 x 5.5 cm, image 24/15. This also exhibits washout on the delayed images compatible with LI-RADS category 5 lesion. Pancreas: No mass, inflammatory changes, or other parenchymal abnormality identified. Spleen:  Within normal limits in size and appearance. Adrenals/Urinary Tract: Normal adrenal glands. No kidney mass or hydronephrosis identified. Stomach/Bowel: Visualized portions within the abdomen are unremarkable. Vascular/Lymphatic: Normal caliber of the abdominal aorta. 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 Upper abdominal adenopathy is identified. Peripancreatic lymph node measures 1.1 x 1.7 cm, image 20/21. Previously 1.0 x 1.3 cm. Portacaval lymph node measures 3.7 x 1.8 cm, image 22/21. Previously 3.6 x 1.7 cm. Other: No ascites. There are several lesions within the central mesentery in the distribution of previous large pseudo cysts. The largest measures 5.0 x 3.1 cm, image 89/14. This is T1 hyperintense, T2 hypointense without enhancement on the postcontrast images. Findings are favored to represent sequelae of prior hemorrhagic pancreatitis with pseudocyst formation. Musculoskeletal: No suspicious bone lesions identified. IMPRESSION: 1. Morphologic features of the liver compatible with cirrhosis. 2. Extensive enhancing liver lesions are identified  throughout both lobes of liver which demonstrate marked progression compared with the previous exam. Enhancing lesions demonstrate washout on the delayed images. In the setting of cirrhosis with progressively elevated alpha fetoprotein levels imaging findings are highly worrisome for multifocal hepatocellular carcinoma. 3. Enlarged upper abdominal lymph nodes identified. Cannot exclude underlying nodal metastasis. 4. There are several lesions within the central mesentery in the distribution of previous large pseudo cysts. The largest measures 5.0 x 3.1 cm. This is favored to represent sequelae of prior hemorrhagic pancreatitis with pseudocyst formation. These results will be called to the ordering clinician or representative by the Radiologist Assistant, and communication documented in the PACS or [employer]. Electronically Signed   By: Kerby Moors M.D.   On: 02/08/2022 05:39     Orders Placed This Encounter  Procedures   CBC with Differential/Platelet    Standing Status:   Standing    Number of Occurrences:   50    Standing Expiration Date:   03/07/2023   Cancer antigen 19-9    Standing Status:   Standing    Number of Occurrences:   20    Standing Expiration Date:   03/07/2023   Comprehensive metabolic panel    Standing Status:   Standing    Number of Occurrences:   50    Standing Expiration Date:   03/07/2023   AFP tumor marker    Standing Status:   Standing    Number of Occurrences:   20    Standing Expiration Date:   03/07/2023   Protime-INR    Standing Status:   Future    Standing Expiration Date:   03/07/2023    All questions were answered. The patient knows to call the clinic with any problems, questions or concerns. The total time spent in the appointment was 60 minutes.     Truitt Merle, MD 03/06/2022 10:00 PM  I, Wilburn Mylar, am acting as scribe for Truitt Merle, MD.   I have reviewed the  above documentation for accuracy and completeness, and I agree with the above.

## 2022-03-06 NOTE — Progress Notes (Signed)
I met with Mrs Connie [name] and her husband after  her consultation with Dr Burr Medico.  I explained my role as a nurse navigator and provided my contact information. I explained the services provided at [name] and provided written information.  I explained the alight grant and let  her know one of the financial advisors will reach out to  her at the time of her chemo education class.  I briefly explained insertion and care of a port a cath.  I showed a sample of the port a cath. I told her if she needs this our Interventional Radiologists will place it.  I told her that she will be scheduled for chemotherapy education class prior to receiving chemotherapy.  I told her our schedulers will call her with those appts.  All questions were answered. She verbalized understanding.

## 2022-03-07 ENCOUNTER — Other Ambulatory Visit: Payer: Self-pay

## 2022-03-09 ENCOUNTER — Other Ambulatory Visit: Payer: Self-pay | Admitting: Interventional Radiology

## 2022-03-09 ENCOUNTER — Encounter (HOSPITAL_COMMUNITY): Payer: Self-pay

## 2022-03-09 ENCOUNTER — Ambulatory Visit (HOSPITAL_COMMUNITY)
Admission: RE | Admit: 2022-03-09 | Discharge: 2022-03-09 | Disposition: A | Discharge status: Home / Self Care | Payer: 59 | Source: Ambulatory Visit | Attending: Interventional Radiology | Admitting: Interventional Radiology

## 2022-03-09 ENCOUNTER — Other Ambulatory Visit: Payer: Self-pay

## 2022-03-09 DIAGNOSIS — C220 Liver cell carcinoma: Secondary | ICD-10-CM

## 2022-03-09 LAB — POCT I-STAT CREATININE: Creatinine, Ser: 2 mg/dL — ABNORMAL HIGH (ref 0.44–1.00)

## 2022-03-10 ENCOUNTER — Other Ambulatory Visit: Payer: Self-pay

## 2022-03-13 ENCOUNTER — Inpatient Hospital Stay: Payer: 59

## 2022-03-13 ENCOUNTER — Other Ambulatory Visit: Payer: Self-pay

## 2022-03-13 DIAGNOSIS — C220 Liver cell carcinoma: Secondary | ICD-10-CM

## 2022-03-13 LAB — PROTIME-INR
INR: 1.4 — ABNORMAL HIGH (ref 0.8–1.2)
Prothrombin Time: 16.5 seconds — ABNORMAL HIGH (ref 11.4–15.2)

## 2022-03-13 LAB — CBC WITH DIFFERENTIAL/PLATELET
Abs Immature Granulocytes: 0.02 10*3/uL (ref 0.00–0.07)
Basophils Absolute: 0.1 10*3/uL (ref 0.0–0.1)
Basophils Relative: 1 %
Eosinophils Absolute: 0.4 10*3/uL (ref 0.0–0.5)
Eosinophils Relative: 3 %
HCT: 46.3 % — ABNORMAL HIGH (ref 36.0–46.0)
Hemoglobin: 15.6 g/dL — ABNORMAL HIGH (ref 12.0–15.0)
Immature Granulocytes: 0 %
Lymphocytes Relative: 31 %
Lymphs Abs: 3.2 10*3/uL (ref 0.7–4.0)
MCH: 33.5 pg (ref 26.0–34.0)
MCHC: 33.7 g/dL (ref 30.0–36.0)
MCV: 99.6 fL (ref 80.0–100.0)
Monocytes Absolute: 0.9 10*3/uL (ref 0.1–1.0)
Monocytes Relative: 8 %
Neutro Abs: 5.8 10*3/uL (ref 1.7–7.7)
Neutrophils Relative %: 57 %
Platelets: 185 10*3/uL (ref 150–400)
RBC: 4.65 MIL/uL (ref 3.87–5.11)
RDW: 14.9 % (ref 11.5–15.5)
WBC: 10.3 10*3/uL (ref 4.0–10.5)
nRBC: 0 % (ref 0.0–0.2)

## 2022-03-13 LAB — COMPREHENSIVE METABOLIC PANEL
ALT: 36 U/L (ref 0–44)
AST: 101 U/L — ABNORMAL HIGH (ref 15–41)
Albumin: 4.3 g/dL (ref 3.5–5.0)
Alkaline Phosphatase: 167 U/L — ABNORMAL HIGH (ref 38–126)
Anion gap: 6 (ref 5–15)
BUN: 19 mg/dL (ref 8–23)
CO2: 26 mmol/L (ref 22–32)
Calcium: 11.6 mg/dL — ABNORMAL HIGH (ref 8.9–10.3)
Chloride: 106 mmol/L (ref 98–111)
Creatinine, Ser: 1.45 mg/dL — ABNORMAL HIGH (ref 0.44–1.00)
GFR, Estimated: 40 mL/min — ABNORMAL LOW (ref >60)
Glucose, Bld: 90 mg/dL (ref 70–99)
Potassium: 4.1 mmol/L (ref 3.5–5.1)
Sodium: 138 mmol/L (ref 135–145)
Total Bilirubin: 0.6 mg/dL (ref 0.3–1.2)
Total Protein: 9 g/dL — ABNORMAL HIGH (ref 6.5–8.1)

## 2022-03-14 ENCOUNTER — Encounter: Payer: Self-pay | Admitting: Lab

## 2022-03-14 ENCOUNTER — Ambulatory Visit
Admission: RE | Admit: 2022-03-14 | Discharge: 2022-03-14 | Disposition: A | Discharge status: Home / Self Care | Payer: 59 | Source: Ambulatory Visit | Attending: Interventional Radiology | Admitting: Interventional Radiology

## 2022-03-14 ENCOUNTER — Telehealth: Payer: Self-pay | Admitting: *Deleted

## 2022-03-14 ENCOUNTER — Other Ambulatory Visit: Payer: Self-pay

## 2022-03-14 ENCOUNTER — Telehealth: Payer: Self-pay | Admitting: Hematology

## 2022-03-14 DIAGNOSIS — C220 Liver cell carcinoma: Secondary | ICD-10-CM

## 2022-03-14 HISTORY — PX: IR RADIOLOGIST EVAL & MGMT: IMG5224

## 2022-03-14 LAB — CANCER ANTIGEN 19-9: CA 19-9: 942 U/mL — ABNORMAL HIGH (ref 0–35)

## 2022-03-14 LAB — AFP TUMOR MARKER: AFP, Serum, Tumor Marker: 436 ng/mL — ABNORMAL HIGH (ref 0.0–9.2)

## 2022-03-14 NOTE — Telephone Encounter (Signed)
Scheduled per 11/14 in basket, pt confirmed appt

## 2022-03-14 NOTE — Progress Notes (Signed)
Patient ID: Connie [name], female   DOB: 01/05/1956, 66 y.o.   MRN: [id number]        Chief Complaint: [facility]  Referring Physician(s): Drazek,Dawn  Burr Medico (Oncology)  History of Present Illness: Connie [name] is a 66 y.o. 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 Burr Medico of Oncology.    In brief review, the patient was admitted to [facility] in April 2021 with atrial fibrillation and RVR however developed hematemesis after the initiation of a heparin drip.  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  She underwent EGD at the time of that admission which demonstrated gastritis and duodenitis but no esophageal or gastric varices or evidence of portal hypertensive gastropathy.  Patient was found to have an approximately 2.5 cm liver lesion on abdominal ultrasound however did not have follow-up for this following her discharge.     She was readmitted in October 2022 with acute pancreatitis, however has never completed a GI work-up for evaluation of pancreatic pseudocysts.  She reports baseline fatigue though states this is unchanged for the past several years.  The patient has no history of ascites or SBP.  She denies increased abdominal girth or abdominal pain.     She reports back pain however states this has been present since her initial diagnosis of pancreatitis and has not worsened recently.   She denies history of hepatic encephalopathy or problems with memory/confusion or concentration.   Patient has history of noncompliance, partially due to poor insurance coverage.   Patient reports significant reduction in her alcohol intake, currently consuming alcohol only approximately once per month.  She has been evaluated by  Dr. Burr Medico and begun on Durvaluamb every 4 weeks, and Tremelimumab '300mg'$  once on day 1 of cycle 1 which will begin next week on 11/21.  Dr. Burr Medico is also considering obtaining a biopsy to rule out a concomitant diagnosis of cholangiocarcinoma given patient's elevated CA 19-9.   Unfortunately, as part of her evaluation, lab work has demonstrated a persistently elevated creatinine and reduced GFR and as such her staging CT scan of the chest, abdomen pelvis was performed without contrast.   The patient does NOT have a nephrologist.   Past Medical History:  Diagnosis Date   Alcohol abuse    Helicobacter pylori gastritis 08/07/2019   Hepatitis C antibody positive in blood - negative RNA    Hypertension     Past Surgical History:  Procedure Laterality Date   BIOPSY  08/03/2019   Procedure: BIOPSY;  Surgeon: Gatha Mayer, MD;  Location: [facility] ENDOSCOPY;  Service: Endoscopy;;   ESOPHAGOGASTRODUODENOSCOPY (EGD) WITH PROPOFOL N/A 08/03/2019   Procedure: ESOPHAGOGASTRODUODENOSCOPY (EGD) WITH PROPOFOL;  Surgeon: Gatha Mayer, MD;  Location: [facility] ENDOSCOPY;  Service: Endoscopy;  Laterality: N/A;   IR RADIOLOGIST EVAL & MGMT  02/14/2022    Allergies: Patient has no known allergies.  Medications: Prior to Admission medications   Medication Sig Start Date End Date Taking? Authorizing Provider  acetaminophen (TYLENOL) 500 MG tablet Take 1,000 mg by mouth every 6 (six) hours as needed for mild pain.    [provider]  co-enzyme Q-10 30 MG capsule Take 30 mg by mouth 3 (three) times a week.    [provider]  diclofenac Sodium (VOLTAREN) 1 % GEL Apply 2 g  topically 2 (two) times daily as needed (pain).    [provider]  gabapentin (NEURONTIN) 100 MG capsule Take 1 capsule (100 mg total) by mouth 3 (three) times daily. 03/06/21 04/05/21  Lavina Hamman, MD  lipase/protease/amylase (CREON) 12000-38000 units CPEP capsule Take 1 capsule (12,000 Units total) by mouth 3 (three) times  daily with meals. 03/06/21   Lavina Hamman, MD  metoprolol succinate (TOPROL-XL) 25 MG 24 hr tablet Take 1 tablet (25 mg total) by mouth daily. 08/04/19   Earlene Plater, MD  nicotine (NICODERM CQ - DOSED IN MG/24 HOURS) 14 mg/24hr patch Place 1 patch (14 mg total) onto the skin daily as needed (smoking cessation). 03/06/21   Lavina Hamman, MD  Omega-3 Fatty Acids (FISH OIL) 1000 MG CAPS Take 1,000 mg by mouth daily.    [provider]  pantoprazole (PROTONIX) 40 MG tablet Take 1 tablet (40 mg total) by mouth 2 (two) times daily. Patient taking differently: Take 40 mg by mouth daily. 08/04/19 02/21/21  Earlene Plater, MD  polyethylene glycol (MIRALAX / GLYCOLAX) 17 g packet Take 17 g by mouth 2 (two) times daily. 03/06/21   Lavina Hamman, MD     Family History  Problem Relation Age of Onset   Lung cancer Mother     Social History   Socioeconomic History   Marital status: Married    Spouse name: Not on file   Number of children: 3   Years of education: Not on file   Highest education level: Not on file  Occupational History   Not on file  Tobacco Use   Smoking status: Every Day    Packs/day: 0.50    Years: 50.00    Total pack years: 25.00    Types: Cigarettes   Smokeless tobacco: Not on file  Substance and Sexual Activity   Alcohol use: Not Currently    Comment: she used to drink alcohol heavily, stopped in 01/2021   Drug use: Never   Sexual activity: Not on file  Other Topics Concern   Not on file  Social History Narrative   ** Merged History Encounter ** Married   Use to live in [place], moved to [place] in 09/2018   Daughter in [place]   Works [employer]   [place], Smoker   Social Determinants of Health   Financial Resource Strain: Not on file  Food Insecurity: Not on file  Transportation Needs: Not on file  Physical Activity: Not on file  Stress: Not on file  Social Connections: Not on file    ECOG Status: 1 - Symptomatic but completely  ambulatory  Review of Systems  Review of Systems: A 12 point ROS discussed and pertinent positives are indicated in the HPI above.  All other systems are negative.   Physical Exam No direct physical exam was performed (except for noted visual exam findings with Video Visits).   Vital Signs: There were no vitals taken for this visit.  Imaging: CT CHEST ABDOMEN PELVIS WO CONTRAST  Result Date: 03/12/2022 CLINICAL DATA:  Newly diagnosed multifocal hepatocellular carcinoma for staging EXAM: CT CHEST, ABDOMEN AND PELVIS WITHOUT CONTRAST TECHNIQUE: Multidetector CT imaging of the chest, abdomen and pelvis was performed following the standard protocol without IV contrast. RADIATION DOSE REDUCTION: This exam was performed according to the departmental dose-optimization program which includes automated exposure control, adjustment of the mA and/or kV according to patient size and/or use of iterative reconstruction technique. COMPARISON:  MRI abdomen dated 02/06/2022, CT  abdomen and pelvis dated 03/03/2021 02/22/2021 FINDINGS: CT CHEST FINDINGS Cardiovascular: Left atrial enlargement. No significant pericardial fluid/thickening. Great vessels are normal in course and caliber. Mediastinum/Nodes: Partially imaged thyroid gland without nodules meeting criteria for imaging follow-up by size. Normal esophagus. No pathologically enlarged axillary, supraclavicular, mediastinal, or hilar lymph nodes. Lungs/Pleura: The central airways are patent. No focal consolidation. Unchanged subpleural lingular 3 mm nodule (6:39) compared to 02/22/2021. No pneumothorax. No pleural effusion. Musculoskeletal: No acute or abnormal lytic or blastic osseous lesions. CT ABDOMEN PELVIS FINDINGS Hepatobiliary: Cirrhotic liver morphology. Extensive bilobar lesions are better evaluated on prior MRI. No intra or extrahepatic biliary ductal dilation. Normal gallbladder. Pancreas: No focal lesions or main ductal dilation. Spleen: Normal in  size without focal abnormality. Adrenals/Urinary Tract: No adrenal nodules. No focal renal lesions, calculi, or hydronephrosis. No focal bladder wall thickening. Stomach/Bowel: Normal appearance of the stomach. No evidence of bowel wall thickening, distention, or inflammatory changes. Normal appendix. Vascular/Lymphatic: Aortic atherosclerosis without aneurysmal dilation. Smooth contour of the IVC. Retroperitoneal varices again seen. Slight increase in size of 2.0 cm portacaval lymph node (2:57) from 1.3 cm on 02/22/2021. No substantial change in size of prominent periportal lymph node measuring 1.0 cm (2:55). No new lymphadenopathy by size criteria. Reproductive: No adnexal masses. Other: Similar ovoid right mesenteric mildly hyperattenuating focus measuring 5.1 x 3.6 cm (2:78), likely reflecting sequela of prior hemorrhagic pancreatitis. A few additional adjacent foci of sequela of prior hemorrhagic pancreatitis are seen adjacent to the gallbladder. Musculoskeletal: No acute or abnormal lytic or blastic osseous findings. Small fat-containing paraumbilical hernia. IMPRESSION: 1. Cirrhotic liver morphology with extensive bilobar lesions, better evaluated on prior MRI. 2. Slight increase in size of a 2.0 cm portacaval lymph node from 1.3 cm on 02/22/2021, suspicious for metastasis. No substantial change in size of prominent periportal lymph node measuring 1.0 cm. No new lymphadenopathy by size criteria. 3. Unchanged 3 mm subpleural lingular nodule dating back to 02/22/2021. 4. No new sites of metastatic disease in the chest, abdomen, or pelvis. 5. Aortic Atherosclerosis (ICD10-I70.0). Electronically Signed   By: Darrin Nipper M.D.   On: 03/12/2022 15:11   IR Radiologist Eval & Mgmt  Result Date: 02/14/2022 Please refer to notes tab for details about interventional procedure. (Op Note)   Labs:  CBC: Recent Labs    03/13/22 1142  WBC 10.3  HGB 15.6*  HCT 46.3*  PLT 185    COAGS: Recent Labs     03/13/22 1141  INR 1.4*    BMP: Recent Labs    03/09/22 0927 03/13/22 1142  NA  --  138  K  --  4.1  CL  --  106  CO2  --  26  GLUCOSE  --  90  BUN  --  19  CALCIUM  --  11.6*  CREATININE 2.00* 1.45*  GFRNONAA  --  40*    LIVER FUNCTION TESTS: Recent Labs    03/13/22 1142  BILITOT 0.6  AST 101*  ALT 36  ALKPHOS 167*  PROT 9.0*  ALBUMIN 4.3    TUMOR MARKERS: AFP: - 11/13 - 436 - 9/21 - 307 - 5/25 - 124 CA 19-9: - 11/13 - 942 - 9/21 - 816 - 5/25 - 190   Assessment and Plan:  Connie [name] is a 66 y.o. 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 Burr Medico of Oncology.   She will begin Durvaluamb every 4 weeks, and Tremelimumab '300mg'$  once on day 1 of cycle 1 next week on 11/21.  Dr. Burr Medico is also considering obtaining a biopsy to rule out a concomitant diagnosis of cholangiocarcinoma given patient's elevated CA 19-9, however the patient states this has not yet been arranged.   Unfortunately, as part of her evaluation, lab work has demonstrated a persistently elevated creatinine and reduced GFR and as such her staging CT scan of the chest, abdomen pelvis was performed without contrast.  The patient does NOT have a nephrologist.   Personal review of noncontrast CT scan of the chest, abdomen and pelvis performed 03/14/2022 suggest progression of both hepatic disease as well as now bulky retroperitoneal and porta hepatis lymphadenopathy with index porta hepatis lymph node measuring 5.1 x 3.6 cm (image 78, series 2).  Unfortunately, given patient's persistently elevated creatinine and reduced GFR she is presently not a candidate for intravenous contrast and as such is presently NOT a candidate for Y90 radioembolization.  I feel it is prudent for the  patient to establish care with a nephrologist to see if contrast exposure is reasonable however note, the patient would require at least 4 contrast exposures including a preprocedural planning CTA of the abdomen pelvis, a mapping Y90 radioembolization and ultimately radioembolization of the right and left lobes of the liver in two separate sessions.  This was discussed at great length with the patient and the patient's husband with fair understanding.  They are expectedly somewhat overwhelmed though are in agreement with the proposed plan of care.  Plan: - Referral to nephrology to evaluate for patient's candidacy for repeat contrast exposures.  Note, if patient is ultimately unable to receive contrast she is NOT a candidate for liver directed therapy.  A copy of this report was sent to the requesting provider on this date.  Electronically Signed: Sandi Mariscal 03/14/2022, 9:01 AM   I spent a total of 25 Minutes in remote  clinical consultation, greater than 50% of which was counseling/coordinating care for Multifocal hepatocellular carcinoma.    Visit type: Audio only (telephone). Audio (no video) only due to patient's lack of internet/smartphone capability. Alternative for in-person consultation at [facility], [address] [place], [place], [place]. This visit type was conducted due to national recommendations for restrictions regarding the COVID-19 Pandemic (e.g. social distancing).  This format is felt to be most appropriate for this patient at this time.  All issues noted in this document were discussed and addressed.

## 2022-03-14 NOTE — Telephone Encounter (Signed)
Referral was sent today by Dr. Pascal Lux to [facility]. To evaluate patients candidacy for repeat contrast exposures. Elevated creatinine.

## 2022-03-15 ENCOUNTER — Other Ambulatory Visit: Payer: Self-pay | Admitting: Hematology

## 2022-03-15 ENCOUNTER — Other Ambulatory Visit: Payer: Self-pay

## 2022-03-15 DIAGNOSIS — C220 Liver cell carcinoma: Secondary | ICD-10-CM

## 2022-03-15 NOTE — Progress Notes (Signed)
Per Dr Burr Medico order placed for u/s liver biopsy.

## 2022-03-15 NOTE — Progress Notes (Signed)
The proposed treatment discussed in conference is for discussion purpose only and is not a binding recommendation.  The patients have not been physically examined, or presented with their treatment options.  Therefore, final treatment plans cannot be decided.  

## 2022-03-16 ENCOUNTER — Encounter: Payer: Self-pay | Admitting: General Practice

## 2022-03-16 ENCOUNTER — Other Ambulatory Visit: Payer: Self-pay | Admitting: Hematology

## 2022-03-16 NOTE — Progress Notes (Signed)
Connie Mariscal, MD  Allen Kell, NT OK for US guided liver lesion Bx.  Pt with presumed multifocal HCC, but has markedly elevated CA 19-9, therefore there is concern for a cholangiocarcinoma.  Cathren Harsh       Previous Messages    ----- Message ----- From: Allen Kell, NT Sent: 03/15/2022   2:24 PM EST To: Ir Procedure Requests Subject: US Biopsy Liver                                Procedure: US Biopsy Liver  Reason: hepatocellular carcinoma by imaging Li RADS 5 but, concern cholagio  History: CT and MRI in Chart  Provider: Truitt Merle, MD  Contact: [phone]

## 2022-03-18 ENCOUNTER — Other Ambulatory Visit: Payer: Self-pay

## 2022-03-20 ENCOUNTER — Other Ambulatory Visit: Payer: Self-pay

## 2022-03-20 ENCOUNTER — Inpatient Hospital Stay: Payer: 59

## 2022-03-20 ENCOUNTER — Other Ambulatory Visit: Payer: Self-pay | Admitting: Hematology

## 2022-03-20 DIAGNOSIS — C220 Liver cell carcinoma: Secondary | ICD-10-CM

## 2022-03-20 NOTE — Assessment & Plan Note (Signed)
-  f/u with liver clinic NP Heide Spark

## 2022-03-20 NOTE — Assessment & Plan Note (Signed)
-  Liver cirrhosis and a 2.5cm mass was found in 07/2019 but she did not follow up -due to rising AFP, she had MRI on 02/06/2022 which showed extensive liver lesions (largest 13cm) throughout both lobes with marked progression from prior MRI; enlarged upper abdominal lymph nodes, no distant mets  -she is being evaluated by IR for liver targeted therapy  -plan to start Durvaluamb every 4 weeks, and Tremelimumab '300mg'$  once on day 1 of cycle 1

## 2022-03-21 ENCOUNTER — Other Ambulatory Visit: Payer: Self-pay

## 2022-03-21 ENCOUNTER — Inpatient Hospital Stay: Payer: 59

## 2022-03-21 ENCOUNTER — Inpatient Hospital Stay (HOSPITAL_BASED_OUTPATIENT_CLINIC_OR_DEPARTMENT_OTHER): Payer: 59 | Admitting: Hematology

## 2022-03-21 ENCOUNTER — Encounter: Payer: Self-pay | Admitting: Hematology

## 2022-03-21 VITALS — BP 115/73 | HR 92 | Temp 98.8°F | Resp 16 | Ht 65.0 in | Wt 234.8 lb

## 2022-03-21 DIAGNOSIS — C220 Liver cell carcinoma: Secondary | ICD-10-CM

## 2022-03-21 DIAGNOSIS — K7031 Alcoholic cirrhosis of liver with ascites: Secondary | ICD-10-CM | POA: Diagnosis not present

## 2022-03-21 LAB — CBC WITH DIFFERENTIAL (CANCER CENTER ONLY)
Abs Immature Granulocytes: 0.02 10*3/uL (ref 0.00–0.07)
Basophils Absolute: 0.1 10*3/uL (ref 0.0–0.1)
Basophils Relative: 1 %
Eosinophils Absolute: 0.4 10*3/uL (ref 0.0–0.5)
Eosinophils Relative: 4 %
HCT: 42.4 % (ref 36.0–46.0)
Hemoglobin: 14.2 g/dL (ref 12.0–15.0)
Immature Granulocytes: 0 %
Lymphocytes Relative: 27 %
Lymphs Abs: 2.6 10*3/uL (ref 0.7–4.0)
MCH: 33.2 pg (ref 26.0–34.0)
MCHC: 33.5 g/dL (ref 30.0–36.0)
MCV: 99.1 fL (ref 80.0–100.0)
Monocytes Absolute: 0.8 10*3/uL (ref 0.1–1.0)
Monocytes Relative: 9 %
Neutro Abs: 5.8 10*3/uL (ref 1.7–7.7)
Neutrophils Relative %: 59 %
Platelet Count: 186 10*3/uL (ref 150–400)
RBC: 4.28 MIL/uL (ref 3.87–5.11)
RDW: 14.5 % (ref 11.5–15.5)
WBC Count: 9.7 10*3/uL (ref 4.0–10.5)
nRBC: 0 % (ref 0.0–0.2)

## 2022-03-21 LAB — TSH: TSH: 3.211 u[IU]/mL (ref 0.350–4.500)

## 2022-03-21 LAB — CMP (CANCER CENTER ONLY)
ALT: 34 U/L (ref 0–44)
AST: 93 U/L — ABNORMAL HIGH (ref 15–41)
Albumin: 3.9 g/dL (ref 3.5–5.0)
Alkaline Phosphatase: 174 U/L — ABNORMAL HIGH (ref 38–126)
Anion gap: 6 (ref 5–15)
BUN: 16 mg/dL (ref 8–23)
CO2: 25 mmol/L (ref 22–32)
Calcium: 12 mg/dL — ABNORMAL HIGH (ref 8.9–10.3)
Chloride: 106 mmol/L (ref 98–111)
Creatinine: 1.3 mg/dL — ABNORMAL HIGH (ref 0.44–1.00)
GFR, Estimated: 45 mL/min — ABNORMAL LOW (ref >60)
Glucose, Bld: 101 mg/dL — ABNORMAL HIGH (ref 70–99)
Potassium: 3.7 mmol/L (ref 3.5–5.1)
Sodium: 137 mmol/L (ref 135–145)
Total Bilirubin: 0.6 mg/dL (ref 0.3–1.2)
Total Protein: 8 g/dL (ref 6.5–8.1)

## 2022-03-21 MED ORDER — SODIUM CHLORIDE 0.9 % IV SOLN
1500.0000 mg | Freq: Once | INTRAVENOUS | Status: AC
  Administered 2022-03-21: 1500 mg via INTRAVENOUS
  Filled 2022-03-21: qty 30

## 2022-03-21 MED ORDER — PROCHLORPERAZINE MALEATE 10 MG PO TABS: 10.0000 mg | ORAL_TABLET | Freq: Four times a day (QID) | ORAL | 0 refills | Status: DC | PRN

## 2022-03-21 MED ORDER — TREMELIMUMAB-ACTL CHEMO INJECTION 25 MG/1.25ML
300.0000 mg | Freq: Once | INTRAVENOUS | Status: AC
  Administered 2022-03-21: 300 mg via INTRAVENOUS
  Filled 2022-03-21: qty 15

## 2022-03-21 MED ORDER — ONDANSETRON HCL 8 MG PO TABS: 8.0000 mg | ORAL_TABLET | Freq: Three times a day (TID) | ORAL | 0 refills | Status: DC | PRN

## 2022-03-21 MED ORDER — SODIUM CHLORIDE 0.9 % IV SOLN: Freq: Once | INTRAVENOUS | Status: AC

## 2022-03-21 NOTE — Progress Notes (Signed)
Per Dr. Burr Medico, ok for treatment today with low heart rate, pt. denies chest pain, dizziness, no shortness of breath noted and elevated AST  93 U/L. After completion of treatment heart rate remained low 39-42. Pt. denies chest pain, dizziness, no shortness of breath noted. Dr. Burr Medico notified and EKG completed ( NSR/Bradycardia) and pt. instructed not to take Metoprolol tomorrow and follow up with her PCP who prescribed the medication. Pt. states she understands and has a blood pressure and heart rate machine at home and will check herself in the morning. Instructed pt. to go the the ED for any further issues or concerns.

## 2022-03-21 NOTE — Progress Notes (Signed)
[place]   [phone] [phone]   Clinic Follow up Note   Patient Care Team: Arthur Holms, NP as PCP - General (Nurse Practitioner) Sanda Klein, MD as PCP - Cardiology (Cardiology) Roosevelt Locks, CRNP as Nurse Practitioner (Transplant Hepatology) Sandi Mariscal, MD as Consulting Physician (Interventional Radiology) Truitt Merle, MD as Consulting Physician (Hematology and Oncology)  Date of Service:  03/21/2022  CHIEF COMPLAINT: f/u of Hepatocellular Carcinoma    CURRENT THERAPY:  Durvaluamb every 4 weeks, and Tremelimumab '300mg'$  once on day 1 of cycle 1  ASSESSMENT:  Connie [name] is a 66 y.o. female with   Hepatocellular carcinoma ([place]) -Liver cirrhosis and a 2.5cm mass was found in 07/2019 but she did not follow up -due to rising AFP, she had MRI on 02/06/2022 which showed extensive liver lesions (largest 13cm) throughout both lobes with marked progression from prior MRI; enlarged upper abdominal lymph nodes, no distant mets  -she is being evaluated by IR for liver targeted therapy  -plan to start Durvaluamb every 4 weeks, and Tremelimumab '300mg'$  once on day 1 of cycle 1  Alcoholic cirrhosis of liver with ascites ([place]) -f/u with liver clinic NP Dawn Drazak    PLAN: -staging CT reviewed with pt  -liver biopsy scheduled for 04/04/22  -proceed with treatment C1 today  -labs reviewed -Referral to nephology  -f/u in 2wks for toxicity check up    SUMMARY OF ONCOLOGIC HISTORY: Oncology History  Hepatocellular carcinoma ([place])  08/02/2019 Imaging   CLINICAL DATA:  Elevated LFTs   EXAM: ULTRASOUND ABDOMEN LIMITED RIGHT UPPER QUADRANT  IMPRESSION: No gallbladder abnormality, cholelithiasis or biliary obstruction   Suboptimal exam as above.  Underlying hepatic cirrhosis suspected.   2.5 cm indeterminate central hypoechoic hepatic lesion. See above comment.   02/22/2021 Imaging   CLINICAL DATA:  Right-sided abdominal pain.    EXAM: ABDOMEN ULTRASOUND COMPLETE  IMPRESSION: 1. Cirrhosis and small ascites. 2. Gallbladder sludge. No sonographic evidence of acute cholecystitis.   02/22/2021 Imaging   CLINICAL DATA:  Abdominal pain.  Concern for diverticulitis.   EXAM: CT ABDOMEN AND PELVIS WITH CONTRAST  IMPRESSION: 1. Acute pancreatitis. No abscess or pseudocyst. 2. Cirrhosis with small ascites. 3. Colonic diverticulosis. No bowel obstruction. Normal appendix. 4. Aortic Atherosclerosis (ICD10-I70.0).   03/02/2021 Tumor Marker   CA 19-9 = 45 (^) AFP = 11 (^)   03/03/2021 Imaging   CLINICAL DATA:  Pancreatitis, worsening abdominal pain.   EXAM: CT ABDOMEN AND PELVIS WITH CONTRAST  IMPRESSION: 1. Multiple suspicious lesions in the right hepatic lobe. Patient has underlying cirrhosis and findings are concerning for multifocal hepatocellular carcinoma. Metastatic disease is also in the differential diagnosis. Recommend MRI of the abdomen with and without contrast. If the patient cannot tolerate a MRI, consider a multiphase liver CT. 2. Increased inflammatory changes in the upper abdomen compatible with pancreatitis and a large developing pseudocyst. This is developing pseudocyst measures up to 14 cm in size. 3. Cirrhosis with evidence of portal hypertension demonstrated by esophageal varices and varices draining into the IVC. 4. Indeterminate 3 mm pulmonary nodule in the lingula. Management of this pulmonary nodule depends on the etiology of the liver lesions.   03/04/2021 Imaging   EXAM: MRI ABDOMEN WITHOUT AND WITH CONTRAST (INCLUDING MRCP)  IMPRESSION: 1. Multiple enhancing lesions are noted throughout the right lobe of the liver, as detailed above. These have indeterminate imaging characteristics. Given the background of hepatic cirrhosis, the possibility of multifocal infiltrative hepatocellular carcinoma should be considered, although  none of these lesions meet definite imaging criteria to  establish that diagnosis. Correlation with AFP level is recommended. Other differential considerations include malignant and benign etiologies such as fibrolamellar hepatocellular carcinoma and multifocal focal nodular hyperplasia ([place]). Consideration for repeat abdominal MRI with and without IV gadolinium (Eovist) is also suggested, although biopsy may ultimately be required to establish a tissue diagnosis. 2. 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 3. Trace volume of perihepatic ascites.   09/22/2021 Tumor Marker   CA 19-9 = 190 (^) AFP = 124 (^)   01/19/2022 Tumor Marker   CA 19-9 = 816 (^) AFP = 307 (^)   02/06/2022 Imaging   EXAM: MRI ABDOMEN WITHOUT AND WITH CONTRAST (INCLUDING MRCP)  IMPRESSION: 1. Morphologic features of the liver compatible with cirrhosis. 2. Extensive enhancing liver lesions are identified throughout both lobes of liver which demonstrate marked progression compared with the previous exam. Enhancing lesions demonstrate washout on the delayed images. In the setting of cirrhosis with progressively elevated alpha fetoprotein levels imaging findings are highly worrisome for multifocal hepatocellular carcinoma. 3. Enlarged upper abdominal lymph nodes identified. Cannot exclude underlying nodal metastasis. 4. There are several lesions within the central mesentery in the distribution of previous large pseudo cysts. The largest measures 5.0 x 3.1 cm. This is favored to represent sequelae of prior hemorrhagic pancreatitis with pseudocyst formation.   02/23/2022 Initial Diagnosis   Hepatocellular carcinoma ([place])   03/20/2022 Cancer Staging   Staging form: Liver, AJCC 8th Edition - Clinical stage from 03/20/2022: Stage IVA (cT3, cN1, cM0) - Signed by Truitt Merle, MD on 03/20/2022   03/21/2022 -  Chemotherapy   Patient is on Treatment Plan : LUNG NSCLC Durvalumab (1500) q28d        INTERVAL HISTORY:   Connie [name] is here for a follow up of Hepatocellular Carcinoma    She was last seen by me on 03/06/2022 She presents to the clinic with husband. Pt had some low  right abdominal pain. No meds prescribe , just ask to keep an eye on it.  Pt had chemo class on 03/20/2022.   All other systems were reviewed with the patient and are negative.  MEDICAL HISTORY:  Past Medical History:  Diagnosis Date   Alcohol abuse    Helicobacter pylori gastritis 08/07/2019   Hepatitis C antibody positive in blood - negative RNA    Hypertension     SURGICAL HISTORY: Past Surgical History:  Procedure Laterality Date   BIOPSY  08/03/2019   Procedure: BIOPSY;  Surgeon: Gatha Mayer, MD;  Location: [facility] ENDOSCOPY;  Service: Endoscopy;;   ESOPHAGOGASTRODUODENOSCOPY (EGD) WITH PROPOFOL N/A 08/03/2019   Procedure: ESOPHAGOGASTRODUODENOSCOPY (EGD) WITH PROPOFOL;  Surgeon: Gatha Mayer, MD;  Location: [facility] ENDOSCOPY;  Service: Endoscopy;  Laterality: N/A;   IR RADIOLOGIST EVAL & MGMT  02/14/2022   IR RADIOLOGIST EVAL & MGMT  03/14/2022    I have reviewed the social history and family history with the patient and they are unchanged from previous note.  ALLERGIES:  has No Known Allergies.  MEDICATIONS:  Current Outpatient Medications  Medication Sig Dispense Refill   acetaminophen (TYLENOL) 500 MG tablet Take 1,000 mg by mouth every 6 (six) hours as needed for mild pain.     co-enzyme Q-10 30 MG capsule Take 30 mg by mouth 3 (three) times a week.     diclofenac Sodium (VOLTAREN) 1 % GEL Apply 2 g topically 2 (two) times daily as needed (  pain).     gabapentin (NEURONTIN) 100 MG capsule Take 1 capsule (100 mg total) by mouth 3 (three) times daily. 90 capsule 0   lipase/protease/amylase (CREON) 12000-38000 units CPEP capsule Take 1 capsule (12,000 Units total) by mouth 3 (three) times daily with meals. 270 capsule 0   metoprolol succinate (TOPROL-XL) 25 MG 24 hr tablet Take 1 tablet (25 mg total) by mouth daily.  30 tablet 1   nicotine (NICODERM CQ - DOSED IN MG/24 HOURS) 14 mg/24hr patch Place 1 patch (14 mg total) onto the skin daily as needed (smoking cessation). 28 patch 0   Omega-3 Fatty Acids (FISH OIL) 1000 MG CAPS Take 1,000 mg by mouth daily.     pantoprazole (PROTONIX) 40 MG tablet Take 1 tablet (40 mg total) by mouth 2 (two) times daily. (Patient taking differently: Take 40 mg by mouth daily.) 60 tablet 1   polyethylene glycol (MIRALAX / GLYCOLAX) 17 g packet Take 17 g by mouth 2 (two) times daily. 14 each 0   No current facility-administered medications for this visit.   Facility-Administered Medications Ordered in Other Visits  Medication Dose Route Frequency Provider Last Rate Last Admin   durvalumab (IMFINZI) 1,500 mg in sodium chloride 0.9 % 100 mL chemo infusion  1,500 mg Intravenous Once Truitt Merle, MD       tremelimumab-actl (IMJUDO) 300 mg in sodium chloride 0.9 % 50 mL (4.6154 mg/mL) chemo infusion  300 mg Intravenous Once Truitt Merle, MD 65 mL/hr at 03/21/22 1056 300 mg at 03/21/22 1056    PHYSICAL EXAMINATION: ECOG PERFORMANCE STATUS: 1 - Symptomatic but completely ambulatory  There were no vitals filed for this visit. Wt Readings from Last 3 Encounters:  03/21/22 234 lb 12 oz (106.5 kg)  03/06/22 232 lb 3.2 oz (105.3 kg)  03/06/21 223 lb 15.8 oz (101.6 kg)     GENERAL:alert, no distress and comfortable SKIN: skin color normal, no rashes or significant lesions EYES: normal, Conjunctiva are pink and non-injected, sclera clear  NEURO: alert & oriented x 3 with fluent speech  LABORATORY DATA:  I have reviewed the data as listed    Latest Ref Rng & Units 03/21/2022    8:22 AM 03/13/2022   11:42 AM 03/06/2021    7:01 AM  CBC  WBC 4.0 - 10.5 K/uL 9.7  10.3  8.8   Hemoglobin 12.0 - 15.0 g/dL 14.2  15.6  11.7   Hematocrit 36.0 - 46.0 % 42.4  46.3  35.5   Platelets 150 - 400 K/uL 186  185  287         Latest Ref Rng & Units 03/21/2022    8:22 AM 03/13/2022   11:42 AM  03/09/2022    9:27 AM  CMP  Glucose 70 - 99 mg/dL 101  90    BUN 8 - 23 mg/dL 16  19    Creatinine 0.44 - 1.00 mg/dL 1.30  1.45  2.00   Sodium 135 - 145 mmol/L 137  138    Potassium 3.5 - 5.1 mmol/L 3.7  4.1    Chloride 98 - 111 mmol/L 106  106    CO2 22 - 32 mmol/L 25  26    Calcium 8.9 - 10.3 mg/dL 12.0  11.6    Total Protein 6.5 - 8.1 g/dL 8.0  9.0    Total Bilirubin 0.3 - 1.2 mg/dL 0.6  0.6    Alkaline Phos 38 - 126 U/L 174  167    AST 15 -  41 U/L 93  101    ALT 0 - 44 U/L 34  36        RADIOGRAPHIC STUDIES: I have personally reviewed the radiological images as listed and agreed with the findings in the report. No results found.    No orders of the defined types were placed in this encounter.  All questions were answered. The patient knows to call the clinic with any problems, questions or concerns. No barriers to learning was detected. The total time spent in the appointment was 30 minutes.     Truitt Merle, MD 03/21/2022   Felicity Coyer, CMA, am acting as scribe for Truitt Merle, MD.   I have reviewed the above documentation for accuracy and completeness, and I agree with the above.

## 2022-03-21 NOTE — Patient Instructions (Signed)
[facility]  Discharge Instructions: Thank you for choosing [facility] to provide your oncology and hematology care.   If you have a lab appointment with the [facility], please go directly to the [facility] and check in at the registration area.   Wear comfortable clothing and clothing appropriate for easy access to any Portacath or PICC line.   We strive to give you quality time with your provider. You may need to reschedule your appointment if you arrive late (15 or more minutes).  Arriving late affects you and other patients whose appointments are after yours.  Also, if you miss three or more appointments without notifying the office, you may be dismissed from the clinic at the provider's discretion.      For prescription refill requests, have your pharmacy contact our office and allow 72 hours for refills to be completed.    Today you received the following chemotherapy and/or immunotherapy agents: Tremelimumab and Durvalumab    To help prevent nausea and vomiting after your treatment, we encourage you to take your nausea medication as directed.  BELOW ARE SYMPTOMS THAT SHOULD BE REPORTED IMMEDIATELY: *FEVER GREATER THAN 100.4 F (38 C) OR HIGHER *CHILLS OR SWEATING *NAUSEA AND VOMITING THAT IS NOT CONTROLLED WITH YOUR NAUSEA MEDICATION *UNUSUAL SHORTNESS OF BREATH *UNUSUAL BRUISING OR BLEEDING *URINARY PROBLEMS (pain or burning when urinating, or frequent urination) *BOWEL PROBLEMS (unusual diarrhea, constipation, pain near the anus) TENDERNESS IN MOUTH AND THROAT WITH OR WITHOUT PRESENCE OF ULCERS (sore throat, sores in mouth, or a toothache) UNUSUAL RASH, SWELLING OR PAIN  UNUSUAL VAGINAL DISCHARGE OR ITCHING   Items with * indicate a potential emergency and should be followed up as soon as possible or go to the Emergency Department if any problems should occur.  Please show the CHEMOTHERAPY ALERT CARD or IMMUNOTHERAPY ALERT CARD  at check-in to the Emergency Department and triage nurse.  Should you have questions after your visit or need to cancel or reschedule your appointment, please contact [facility]  Dept: [phone]  and follow the prompts.  Office hours are 8:00 a.m. to 4:30 p.m. Monday - Friday. Please note that voicemails left after 4:00 p.m. may not be returned until the following business day.  We are closed weekends and major holidays. You have access to a nurse at all times for urgent questions. Please call the main number to the clinic Dept: [phone] and follow the prompts.   For any non-urgent questions, you may also contact your provider using MyChart. We now offer e-Visits for anyone 66 and older to request care online for non-urgent symptoms. For details visit [email or web address].   Also download the MyChart app! Go to the app store, search "MyChart", open the app, select [facility], and log in with your MyChart username and password.  Masks are optional in the cancer centers. If you would like for your care team to wear a mask while they are taking care of you, please let them know. You may have one support person who is at least 66 years old accompany you for your appointments. Tremelimumab Injection What is this medication? TREMELIMUMAB (tre mel IM ue mab) treats liver cancer and lung cancer. It works by helping your immune system slow or stop the spread of cancer cells. It is a monoclonal antibody. This medicine may be used for other purposes; ask your health care provider or pharmacist if you have questions. COMMON BRAND NAME(S): IMJUDO What  should I tell my care team before I take this medication? They need to know if you have any of these conditions: Addison disease Autoimmune diseases, such as Crohn's disease, ulcerative colitis, or lupus Diabetes Immune system problems Lung disease Nervous system problems, such as myasthenia gravis or Guillain-Barre  syndrome Stomach or intestine problems, such as colitis Thyroid disease An unusual allergic reaction to tremelimumab, other medications, foods, dyes, or preservatives Pregnant or trying to get pregnant Breast-feeding How should I use this medication? This medication is infused into a vein. It is given by your care team in a hospital or clinic setting. A special MedGuide will be given to you before each treatment. Be sure to read this information carefully each time. Talk to your care team about the use of this medication in children. Special care may be needed. Overdosage: If you think you have taken too much of this medicine contact a poison control center or emergency room at once. NOTE: This medicine is only for you. Do not share this medicine with others. What if I miss a dose? Keep appointments for follow-up doses. It is important not to miss your dose. Call your care team if you are unable to keep an appointment. What may interact with this medication? Interactions have not been studied. This list may not describe all possible interactions. Give your health care provider a list of all the medicines, herbs, non-prescription drugs, or dietary supplements you use. Also tell them if you smoke, drink alcohol, or use illegal drugs. Some items may interact with your medicine. What should I watch for while using this medication? This medication may make you feel generally unwell. This is not uncommon as chemotherapy can affect healthy cells as well as cancer cells. Report any side effects. Continue your course of treatment even though you feel ill unless your care team tells you to stop. This medication may cause serious skin reactions. They can happen weeks to months after starting the medication. Contact your care team right away if you notice fevers or flu-like symptoms with a rash. The rash may be red or purple and then turn into blisters or peeling of the skin. You may also notice a red rash  with swelling of the face, lips, or lymph nodes in your neck or under your arms. Talk to your care team if you wish to become pregnant or think you might be pregnant. This medication can cause serious birth defects if taken during pregnancy. A negative pregnancy test is required before starting this medication. A reliable form of contraception is recommended while taking this medication and for 3 months after stopping it. Do not breast-feed while taking this medication and for 3 months after stopping it. You may need bloodwork while taking this medication. What side effects may I notice from receiving this medication? Side effects that you should report to your care team as soon as possible: Allergic reactions--skin rash, itching, hives, swelling of the face, lips, tongue, or throat Dry cough, shortness of breath or trouble breathing Eye pain, redness, irritation, or discharge with blurry or decreased vision Heart muscle inflammation--unusual weakness or fatigue, shortness of breath, chest pain, fast or irregular heartbeat, dizziness, swelling of the ankles, feet, or hands Hormone gland problems--headache, sensitivity to light, unusual weakness or fatigue, dizziness, fast or irregular heartbeat, increased sensitivity to cold or heat, excessive sweating, constipation, hair loss, increased thirst or amount of urine, tremors or shaking, irritability Infusion reactions--chest pain, shortness of breath or trouble breathing, feeling faint or  lightheaded Kidney injury (glomerulonephritis)--decrease in the amount of urine, red or dark brown urine, foamy or bubbly urine, swelling of the ankles, hands, or feet Liver injury--right upper belly pain, loss of appetite, nausea, light-colored stool, dark yellow or brown urine, yellowing skin or eyes, unusual weakness or fatigue Pain, tingling, or numbness in the hands or feet, muscle weakness, change in vision, confusion or trouble speaking, loss of balance or  coordination, trouble walking, seizures Pancreatitis--severe stomach pain that spreads to your back or gets worse after eating or when touched, fever, nausea, vomiting Rash, fever, and swollen lymph nodes Redness, blistering, peeling, or loosening of the skin, including inside the mouth Stomach pain that is severe, does not go away, or gets worse Sudden or severe stomach pain, bloody diarrhea, fever, nausea, vomiting Side effects that usually do not require medical attention (report these to your care team if they continue or are bothersome): Bone, joint, or muscle pain Diarrhea Fatigue Loss of appetite Nausea Skin rash This list may not describe all possible side effects. Call your doctor for medical advice about side effects. You may report side effects to FDA at 1-800-FDA-1088. Where should I keep my medication? This medication is given in a hospital or clinic. It will not be stored at home. NOTE: This sheet is a summary. It may not cover all possible information. If you have questions about this medicine, talk to your doctor, pharmacist, or health care provider.  2023 Elsevier/Gold Standard (2021-03-11 00:00:00) Durvalumab Injection What is this medication? DURVALUMAB (dur VAL ue mab) treats some types of cancer. It works by helping your immune system slow or stop the spread of cancer cells. It is a monoclonal antibody. This medicine may be used for other purposes; ask your health care provider or pharmacist if you have questions. COMMON BRAND NAME(S): IMFINZI What should I tell my care team before I take this medication? They need to know if you have any of these conditions: Allogeneic stem cell transplant (uses someone else's stem cells) Autoimmune diseases, such as Crohn disease, ulcerative colitis, lupus History of chest radiation Nervous system problems, such as Guillain-Barre syndrome, myasthenia gravis Organ transplant An unusual or allergic reaction to durvalumab, other  medications, foods, dyes, or preservatives Pregnant or trying to get pregnant Breast-feeding How should I use this medication? This medication is infused into a vein. It is given by your care team in a hospital or clinic setting. A special MedGuide will be given to you before each treatment. Be sure to read this information carefully each time. Talk to your care team about the use of this medication in children. Special care may be needed. Overdosage: If you think you have taken too much of this medicine contact a poison control center or emergency room at once. NOTE: This medicine is only for you. Do not share this medicine with others. What if I miss a dose? Keep appointments for follow-up doses. It is important not to miss your dose. Call your care team if you are unable to keep an appointment. What may interact with this medication? Interactions have not been studied. This list may not describe all possible interactions. Give your health care provider a list of all the medicines, herbs, non-prescription drugs, or dietary supplements you use. Also tell them if you smoke, drink alcohol, or use illegal drugs. Some items may interact with your medicine. What should I watch for while using this medication? Your condition will be monitored carefully while you are receiving this medication. You may  need blood work while taking this medication. This medication may cause serious skin reactions. They can happen weeks to months after starting the medication. Contact your care team right away if you notice fevers or flu-like symptoms with a rash. The rash may be red or purple and then turn into blisters or peeling of the skin. You may also notice a red rash with swelling of the face, lips, or lymph nodes in your neck or under your arms. Tell your care team right away if you have any change in your eyesight. Talk to your care team if you may be pregnant. Serious birth defects can occur if you take this  medication during pregnancy and for 3 months after the last dose. You will need a negative pregnancy test before starting this medication. Contraception is recommended while taking this medication and for 3 months after the last dose. Your care team can help you find the option that works for you. Do not breastfeed while taking this medication and for 3 months after the last dose. What side effects may I notice from receiving this medication? Side effects that you should report to your care team as soon as possible: Allergic reactions--skin rash, itching, hives, swelling of the face, lips, tongue, or throat Dry cough, shortness of breath or trouble breathing Eye pain, redness, irritation, or discharge with blurry or decreased vision Heart muscle inflammation--unusual weakness or fatigue, shortness of breath, chest pain, fast or irregular heartbeat, dizziness, swelling of the ankles, feet, or hands Hormone gland problems--headache, sensitivity to light, unusual weakness or fatigue, dizziness, fast or irregular heartbeat, increased sensitivity to cold or heat, excessive sweating, constipation, hair loss, increased thirst or amount of urine, tremors or shaking, irritability Infusion reactions--chest pain, shortness of breath or trouble breathing, feeling faint or lightheaded Kidney injury (glomerulonephritis)--decrease in the amount of urine, red or dark brown urine, foamy or bubbly urine, swelling of the ankles, hands, or feet Liver injury--right upper belly pain, loss of appetite, nausea, light-colored stool, dark yellow or brown urine, yellowing skin or eyes, unusual weakness or fatigue Pain, tingling, or numbness in the hands or feet, muscle weakness, change in vision, confusion or trouble speaking, loss of balance or coordination, trouble walking, seizures Rash, fever, and swollen lymph nodes Redness, blistering, peeling, or loosening of the skin, including inside the mouth Sudden or severe stomach  pain, bloody diarrhea, fever, nausea, vomiting Side effects that usually do not require medical attention (report these to your care team if they continue or are bothersome): Bone, joint, or muscle pain Diarrhea Fatigue Loss of appetite Nausea Skin rash This list may not describe all possible side effects. Call your doctor for medical advice about side effects. You may report side effects to FDA at 1-800-FDA-1088. Where should I keep my medication? This medication is given in a hospital or clinic. It will not be stored at home. NOTE: This sheet is a summary. It may not cover all possible information. If you have questions about this medicine, talk to your doctor, pharmacist, or health care provider.  2023 Elsevier/Gold Standard (2021-08-08 00:00:00)

## 2022-03-22 ENCOUNTER — Telehealth: Payer: Self-pay

## 2022-03-22 ENCOUNTER — Telehealth: Payer: Self-pay | Admitting: Hematology

## 2022-03-22 LAB — T4: T4, Total: 10 ug/dL (ref 4.5–12.0)

## 2022-03-22 NOTE — Telephone Encounter (Signed)
LVM with pt's son Erlene Quan for the pt to contact [facility] to schedule an appointment with them.  Provided telephone number [phone].  Erlene Quan stated he would tell the pt to call.  MyChart message was also sent with this information.

## 2022-03-22 NOTE — Telephone Encounter (Signed)
LM for patient that this nurse was calling to see how they were doing after their treatment. Please call back to Dr.  Feng's nurse at [phone] if they have any questions or concerns regarding the treatment. 

## 2022-03-22 NOTE — Telephone Encounter (Signed)
-----   Message from Lester Baraga, RN sent at 03/21/2022  3:51 PM EST ----- Regarding: Dr. Burr Medico Pt. first time Imjudo and Durvalumab. Tolerated treatment well. Heart rate upon arrival was 39, then 54 then 39-41 at completion. No chest pain, dizziness, no shortness of breath. EKG done and pt. NSR bradycardia. Per Dr. Burr Medico, instructed pt. not to take her Metoprolol tomorrow and follow up with her PCP. Pt. states she has a BP and pulse monitor so will check it in the morning and follow up with PCP.  Thank you!

## 2022-03-23 ENCOUNTER — Other Ambulatory Visit: Payer: Self-pay

## 2022-03-28 ENCOUNTER — Other Ambulatory Visit: Payer: Self-pay | Admitting: Hematology

## 2022-03-28 ENCOUNTER — Encounter: Payer: Self-pay | Admitting: Hematology

## 2022-03-28 ENCOUNTER — Other Ambulatory Visit: Payer: Self-pay

## 2022-03-30 ENCOUNTER — Inpatient Hospital Stay: Payer: 59

## 2022-03-30 ENCOUNTER — Other Ambulatory Visit: Payer: Self-pay

## 2022-03-30 DIAGNOSIS — C220 Liver cell carcinoma: Secondary | ICD-10-CM | POA: Diagnosis not present

## 2022-03-30 LAB — CBC WITH DIFFERENTIAL/PLATELET
Abs Immature Granulocytes: 0.05 10*3/uL (ref 0.00–0.07)
Basophils Absolute: 0.1 10*3/uL (ref 0.0–0.1)
Basophils Relative: 1 %
Eosinophils Absolute: 0.4 10*3/uL (ref 0.0–0.5)
Eosinophils Relative: 4 %
HCT: 45.1 % (ref 36.0–46.0)
Hemoglobin: 15.3 g/dL — ABNORMAL HIGH (ref 12.0–15.0)
Immature Granulocytes: 1 %
Lymphocytes Relative: 28 %
Lymphs Abs: 2.9 10*3/uL (ref 0.7–4.0)
MCH: 33.7 pg (ref 26.0–34.0)
MCHC: 33.9 g/dL (ref 30.0–36.0)
MCV: 99.3 fL (ref 80.0–100.0)
Monocytes Absolute: 0.8 10*3/uL (ref 0.1–1.0)
Monocytes Relative: 8 %
Neutro Abs: 6 10*3/uL (ref 1.7–7.7)
Neutrophils Relative %: 58 %
Platelets: 215 10*3/uL (ref 150–400)
RBC: 4.54 MIL/uL (ref 3.87–5.11)
RDW: 14.6 % (ref 11.5–15.5)
WBC: 10.2 10*3/uL (ref 4.0–10.5)
nRBC: 0 % (ref 0.0–0.2)

## 2022-03-30 LAB — COMPREHENSIVE METABOLIC PANEL
ALT: 71 U/L — ABNORMAL HIGH (ref 0–44)
AST: 69 U/L — ABNORMAL HIGH (ref 15–41)
Albumin: 4 g/dL (ref 3.5–5.0)
Alkaline Phosphatase: 150 U/L — ABNORMAL HIGH (ref 38–126)
Anion gap: 8 (ref 5–15)
BUN: 19 mg/dL (ref 8–23)
CO2: 24 mmol/L (ref 22–32)
Calcium: 11.8 mg/dL — ABNORMAL HIGH (ref 8.9–10.3)
Chloride: 105 mmol/L (ref 98–111)
Creatinine, Ser: 1.51 mg/dL — ABNORMAL HIGH (ref 0.44–1.00)
GFR, Estimated: 38 mL/min — ABNORMAL LOW (ref >60)
Glucose, Bld: 112 mg/dL — ABNORMAL HIGH (ref 70–99)
Potassium: 3.9 mmol/L (ref 3.5–5.1)
Sodium: 137 mmol/L (ref 135–145)
Total Bilirubin: 0.6 mg/dL (ref 0.3–1.2)
Total Protein: 8.6 g/dL — ABNORMAL HIGH (ref 6.5–8.1)

## 2022-03-30 MED ORDER — SODIUM CHLORIDE 0.9 % IV SOLN
90.0000 mg | Freq: Once | INTRAVENOUS | Status: AC
  Administered 2022-03-30: 90 mg via INTRAVENOUS
  Filled 2022-03-30: qty 10

## 2022-03-30 MED ORDER — SODIUM CHLORIDE 0.9 % IV SOLN: Freq: Once | INTRAVENOUS | Status: AC

## 2022-03-30 NOTE — Patient Instructions (Signed)
Pamidronate Injection What is this medication? PAMIDRONATE (pa mi DROE nate) treats high calcium levels in the blood caused by cancer. It may also be used with chemotherapy to treat weakened bones caused by cancer. It can also be used to treat Paget's disease of the bone. It works by slowing down the release of calcium from bones. This lowers calcium levels in your blood. It also makes your bones stronger and less likely to break (fracture). It belongs to a group of medications called bisphosphonates. This medicine may be used for other purposes; ask your health care provider or pharmacist if you have questions. COMMON BRAND NAME(S): Aredia What should I tell my care team before I take this medication? They need to know if you have any of these conditions: Bleeding disorder Cancer Dental disease Kidney disease Low levels of calcium or other minerals in the blood Low red blood cell counts Receiving steroids, such as dexamethasone or prednisone An unusual or allergic reaction to pamidronate, other medications, foods, dyes or preservatives Pregnant or trying to get pregnant Breast-feeding How should I use this medication? This medication is injected into a vein. It is given by your care team in a hospital or clinic setting. Talk to your care team about the use of this medication in children. Special care may be needed. Overdosage: If you think you have taken too much of this medicine contact a poison control center or emergency room at once. NOTE: This medicine is only for you. Do not share this medicine with others. What if I miss a dose? Keep appointments for follow-up doses. It is important not to miss your dose. Call your care team if you are unable to keep an appointment. What may interact with this medication? Certain antibiotics given by injection Medications for inflammation or pain, such as ibuprofen, naproxen Some diuretics, such as bumetanide, furosemide Cyclosporine Parathyroid  hormone Tacrolimus Teriparatide Thalidomide This list may not describe all possible interactions. Give your health care provider a list of all the medicines, herbs, non-prescription drugs, or dietary supplements you use. Also tell them if you smoke, drink alcohol, or use illegal drugs. Some items may interact with your medicine. What should I watch for while using this medication? Visit your care team for regular checks on your progress. It may be some time before you see the benefit from this medication. Some people who take this medication have severe bone, joint, or muscle pain. This medication may also increase your risk for jaw problems or a broken thigh bone. Tell your care team right away if you have severe pain in your jaw, bones, joints, or muscles. Tell your care team if you have any pain that does not go away or that gets worse. Tell your dentist and dental surgeon that you are taking this medication. You should not have major dental surgery while on this medication. See your dentist to have a dental exam and fix any dental problems before starting this medication. Take good care of your teeth while on this medication. Make sure you see your dentist for regular follow-up appointments. You should make sure you get enough calcium and vitamin D while you are taking this medication. Discuss the foods you eat and the vitamins you take with your care team. You may need bloodwork while you are taking this medication. Talk to your care team if you wish to become pregnant or think you might be pregnant. This medication can cause serious birth defects. What side effects may I notice from receiving   this medication? Side effects that you should report to your care team as soon as possible: Allergic reactions--skin rash, itching, hives, swelling of the face, lips, tongue, or throat Kidney injury--decrease in the amount of urine, swelling of the ankles, hands, or feet Low calcium level--muscle pain or  cramps, confusion, tingling, or numbness in the hands or feet Osteonecrosis of the jaw--pain, swelling, or redness in the mouth, numbness of the jaw, poor healing after dental work, unusual discharge from the mouth, visible bones in the mouth Severe bone, joint, or muscle pain Side effects that usually do not require medical attention (report to your care team if they continue or are bothersome): Constipation Fatigue Fever Loss of appetite Nausea Pain, redness, or irritation at injection site Stomach pain This list may not describe all possible side effects. Call your doctor for medical advice about side effects. You may report side effects to FDA at 1-800-FDA-1088. Where should I keep my medication? This medication is given in a hospital or clinic. It will not be stored at home. NOTE: This sheet is a summary. It may not cover all possible information. If you have questions about this medicine, talk to your doctor, pharmacist, or health care provider.  2023 Elsevier/Gold Standard (2021-06-02 00:00:00)  

## 2022-03-31 LAB — PTH, INTACT AND CALCIUM
Calcium, Total (PTH): 11.1 mg/dL — ABNORMAL HIGH (ref 8.7–10.3)
PTH: 7 pg/mL — ABNORMAL LOW (ref 15–65)

## 2022-04-01 ENCOUNTER — Other Ambulatory Visit: Payer: Self-pay | Admitting: Hematology

## 2022-04-03 ENCOUNTER — Other Ambulatory Visit: Payer: Self-pay | Admitting: Radiology

## 2022-04-03 ENCOUNTER — Other Ambulatory Visit: Payer: Self-pay

## 2022-04-03 DIAGNOSIS — C220 Liver cell carcinoma: Secondary | ICD-10-CM

## 2022-04-03 NOTE — Progress Notes (Signed)
Referring Physician(s): Feng,Y  Supervising Physician: Aletta Edouard  Patient Status:  [facility]  Chief Complaint:  Liver masses, presumed hepatocellular carcinoma  Subjective: Patient known to IR service from consultation with Dr. 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  She is a 66 year old female with past medical history significant for hypertension, hepatitis C, alcoholic cirrhosis, renal insufficiency, pancreatitis who presents now with imaging findings of :  1. Cirrhotic liver morphology with extensive bilobar lesions, better evaluated on prior MRI. 2. Slight increase in size of a 2.0 cm portacaval lymph node from 1.3 cm on 02/22/2021, suspicious for metastasis. No substantial change in size of prominent periportal lymph node measuring 1.0 cm. No new lymphadenopathy by size criteria. 3. Unchanged 3 mm subpleural lingular nodule dating back to 02/22/2021. 4. No new sites of metastatic disease in the chest, abdomen, or pelvis. 5. Aortic Atherosclerosis  She has elevated CA 19-9 as well as AFP.  She presents today for image guided liver mass biopsy to confirm presumed [place], rule out cholangiocarcinoma as well.   Past Medical History:  Diagnosis Date   Alcohol abuse    Helicobacter pylori gastritis 08/07/2019   Hepatitis C antibody positive in blood - negative RNA    Hypertension    Past Surgical History:  Procedure Laterality Date   BIOPSY  08/03/2019   Procedure: BIOPSY;  Surgeon: Gatha Mayer, MD;  Location: [facility] ENDOSCOPY;  Service: Endoscopy;;   ESOPHAGOGASTRODUODENOSCOPY (EGD) WITH PROPOFOL N/A 08/03/2019   Procedure: ESOPHAGOGASTRODUODENOSCOPY (EGD) WITH PROPOFOL;  Surgeon: Gatha Mayer, MD;  Location: [facility] ENDOSCOPY;  Service: Endoscopy;  Laterality: N/A;   IR RADIOLOGIST EVAL & MGMT  02/14/2022   IR RADIOLOGIST EVAL & MGMT  03/14/2022      Allergies: Patient has no known  allergies.  Medications: Prior to Admission medications   Medication Sig Start Date End Date Taking? Authorizing Provider  acetaminophen (TYLENOL) 500 MG tablet Take 1,000 mg by mouth every 6 (six) hours as needed for mild pain.    [provider]  co-enzyme Q-10 30 MG capsule Take 30 mg by mouth 3 (three) times a week.    [provider]  diclofenac Sodium (VOLTAREN) 1 % GEL Apply 2 g topically 2 (two) times daily as needed (pain).    [provider]  gabapentin (NEURONTIN) 100 MG capsule Take 1 capsule (100 mg total) by mouth 3 (three) times daily. 03/06/21 04/05/21  Lavina Hamman, MD  lipase/protease/amylase (CREON) 12000-38000 units CPEP capsule Take 1 capsule (12,000 Units total) by mouth 3 (three) times daily with meals. 03/06/21   Lavina Hamman, MD  metoprolol succinate (TOPROL-XL) 25 MG 24 hr tablet Take 1 tablet (25 mg total) by mouth daily. 08/04/19   Earlene Plater, MD  nicotine (NICODERM CQ - DOSED IN MG/24 HOURS) 14 mg/24hr patch Place 1 patch (14 mg total) onto the skin daily as needed (smoking cessation). 03/06/21   Lavina Hamman, MD  Omega-3 Fatty Acids (FISH OIL) 1000 MG CAPS Take 1,000 mg by mouth daily.    [provider]  ondansetron (ZOFRAN) 8 MG tablet Take 1 tablet (8 mg total) by mouth every 8 (eight) hours as needed for nausea or vomiting. 03/21/22   Truitt Merle, MD  pantoprazole (PROTONIX) 40 MG tablet Take 1 tablet (40 mg total) by mouth 2 (two) times daily. Patient taking differently: Take 40 mg by mouth daily. 08/04/19 02/21/21  Earlene Plater, MD  polyethylene glycol (  MIRALAX / GLYCOLAX) 17 g packet Take 17 g by mouth 2 (two) times daily. 03/06/21   Lavina Hamman, MD  prochlorperazine (COMPAZINE) 10 MG tablet Take 1 tablet (10 mg total) by mouth every 6 (six) hours as needed for nausea or vomiting. 03/21/22   Truitt Merle, MD     Vital Signs:   '@PHYSEXAM'$ @  Imaging: No results found.  Labs:  CBC: Recent Labs     03/13/22 1142 03/21/22 0822 03/30/22 1051  WBC 10.3 9.7 10.2  HGB 15.6* 14.2 15.3*  HCT 46.3* 42.4 45.1  PLT 185 186 215    COAGS: Recent Labs    03/13/22 1141  INR 1.4*    BMP: Recent Labs    03/09/22 0927 03/13/22 1142 03/21/22 0822 03/30/22 1051  NA  --  138 137 137  K  --  4.1 3.7 3.9  CL  --  106 106 105  CO2  --  '26 25 24  '$ GLUCOSE  --  90 101* 112*  BUN  --  '19 16 19  '$ CALCIUM  --  11.6* 12.0* 11.8*  11.1*  CREATININE 2.00* 1.45* 1.30* 1.51*  GFRNONAA  --  40* 45* 38*    LIVER FUNCTION TESTS: Recent Labs    03/13/22 1142 03/21/22 0822 03/30/22 1051  BILITOT 0.6 0.6 0.6  AST 101* 93* 69*  ALT 36 34 71*  ALKPHOS 167* 174* 150*  PROT 9.0* 8.0 8.6*  ALBUMIN 4.3 3.9 4.0    Assessment and Plan: Patient known to IR service from consultation with Dr. 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  She is a 66 year old female with past medical history significant for hypertension, hepatitis C, alcoholic cirrhosis, renal insufficiency, pancreatitis who presents now with imaging findings of :  1. Cirrhotic liver morphology with extensive bilobar lesions, better evaluated on prior MRI. 2. Slight increase in size of a 2.0 cm portacaval lymph node from 1.3 cm on 02/22/2021, suspicious for metastasis. No substantial change in size of prominent periportal lymph node measuring 1.0 cm. No new lymphadenopathy by size criteria. 3. Unchanged 3 mm subpleural lingular nodule dating back to 02/22/2021. 4. No new sites of metastatic disease in the chest, abdomen, or pelvis. 5. Aortic Atherosclerosis  She has elevated CA 19-9 as well as AFP.  She presents today for image guided liver mass biopsy to confirm presumed [place], rule out cholangiocarcinoma as well.Risks and benefits of procedure was discussed with the patient including, but not limited to bleeding, infection, damage to adjacent  structures or low yield requiring additional tests.  All of the questions were answered and there is agreement to proceed.  Consent signed and in chart.    Electronically Signed: D. Rowe Robert, PA-C 04/03/2022, 4:34 PM   I spent a total of 25 minutes at the the patient's bedside AND on the patient's hospital floor or unit, greater than 50% of which was counseling/coordinating care for image guided liver mass biopsy

## 2022-04-04 ENCOUNTER — Ambulatory Visit (HOSPITAL_COMMUNITY)
Admission: RE | Admit: 2022-04-04 | Discharge: 2022-04-04 | Disposition: A | Discharge status: Home / Self Care | Payer: 59 | Source: Ambulatory Visit

## 2022-04-04 ENCOUNTER — Other Ambulatory Visit: Payer: Self-pay

## 2022-04-04 ENCOUNTER — Encounter: Payer: Self-pay | Admitting: Hematology

## 2022-04-04 ENCOUNTER — Encounter (HOSPITAL_COMMUNITY): Payer: Self-pay

## 2022-04-04 ENCOUNTER — Ambulatory Visit (HOSPITAL_COMMUNITY)
Admission: RE | Admit: 2022-04-04 | Discharge: 2022-04-04 | Disposition: A | Discharge status: Home / Self Care | Payer: 59 | Source: Ambulatory Visit | Attending: Hematology | Admitting: Hematology

## 2022-04-04 DIAGNOSIS — Z8719 Personal history of other diseases of the digestive system: Secondary | ICD-10-CM | POA: Diagnosis not present

## 2022-04-04 DIAGNOSIS — R911 Solitary pulmonary nodule: Secondary | ICD-10-CM | POA: Diagnosis not present

## 2022-04-04 DIAGNOSIS — C220 Liver cell carcinoma: Secondary | ICD-10-CM

## 2022-04-04 DIAGNOSIS — I10 Essential (primary) hypertension: Secondary | ICD-10-CM | POA: Insufficient documentation

## 2022-04-04 DIAGNOSIS — R222 Localized swelling, mass and lump, trunk: Secondary | ICD-10-CM | POA: Diagnosis not present

## 2022-04-04 DIAGNOSIS — R160 Hepatomegaly, not elsewhere classified: Secondary | ICD-10-CM | POA: Diagnosis present

## 2022-04-04 DIAGNOSIS — I700 Atherosclerosis of aorta: Secondary | ICD-10-CM | POA: Insufficient documentation

## 2022-04-04 DIAGNOSIS — Z8619 Personal history of other infectious and parasitic diseases: Secondary | ICD-10-CM | POA: Insufficient documentation

## 2022-04-04 DIAGNOSIS — K7460 Unspecified cirrhosis of liver: Secondary | ICD-10-CM | POA: Insufficient documentation

## 2022-04-04 LAB — COMPREHENSIVE METABOLIC PANEL
ALT: 57 U/L — ABNORMAL HIGH (ref 0–44)
AST: 91 U/L — ABNORMAL HIGH (ref 15–41)
Albumin: 3.5 g/dL (ref 3.5–5.0)
Alkaline Phosphatase: 140 U/L — ABNORMAL HIGH (ref 38–126)
Anion gap: 8 (ref 5–15)
BUN: 15 mg/dL (ref 8–23)
CO2: 21 mmol/L — ABNORMAL LOW (ref 22–32)
Calcium: 10.4 mg/dL — ABNORMAL HIGH (ref 8.9–10.3)
Chloride: 110 mmol/L (ref 98–111)
Creatinine, Ser: 1.47 mg/dL — ABNORMAL HIGH (ref 0.44–1.00)
GFR, Estimated: 39 mL/min — ABNORMAL LOW (ref >60)
Glucose, Bld: 92 mg/dL (ref 70–99)
Potassium: 3.9 mmol/L (ref 3.5–5.1)
Sodium: 139 mmol/L (ref 135–145)
Total Bilirubin: 0.7 mg/dL (ref 0.3–1.2)
Total Protein: 8 g/dL (ref 6.5–8.1)

## 2022-04-04 LAB — CBC WITH DIFFERENTIAL/PLATELET
Abs Immature Granulocytes: 0.03 10*3/uL (ref 0.00–0.07)
Basophils Absolute: 0.1 10*3/uL (ref 0.0–0.1)
Basophils Relative: 1 %
Eosinophils Absolute: 0.5 10*3/uL (ref 0.0–0.5)
Eosinophils Relative: 5 %
HCT: 46.3 % — ABNORMAL HIGH (ref 36.0–46.0)
Hemoglobin: 15.2 g/dL — ABNORMAL HIGH (ref 12.0–15.0)
Immature Granulocytes: 0 %
Lymphocytes Relative: 27 %
Lymphs Abs: 2.7 10*3/uL (ref 0.7–4.0)
MCH: 33.3 pg (ref 26.0–34.0)
MCHC: 32.8 g/dL (ref 30.0–36.0)
MCV: 101.3 fL — ABNORMAL HIGH (ref 80.0–100.0)
Monocytes Absolute: 0.7 10*3/uL (ref 0.1–1.0)
Monocytes Relative: 7 %
Neutro Abs: 6.1 10*3/uL (ref 1.7–7.7)
Neutrophils Relative %: 60 %
Platelets: 216 10*3/uL (ref 150–400)
RBC: 4.57 MIL/uL (ref 3.87–5.11)
RDW: 14.6 % (ref 11.5–15.5)
WBC: 10 10*3/uL (ref 4.0–10.5)
nRBC: 0 % (ref 0.0–0.2)

## 2022-04-04 LAB — PROTIME-INR
INR: 1.5 — ABNORMAL HIGH (ref 0.8–1.2)
Prothrombin Time: 17.8 seconds — ABNORMAL HIGH (ref 11.4–15.2)

## 2022-04-04 MED ORDER — FENTANYL CITRATE (PF) 100 MCG/2ML IJ SOLN
INTRAMUSCULAR | Status: AC | PRN
  Administered 2022-04-04 (×2): 50 ug via INTRAVENOUS

## 2022-04-04 MED ORDER — LIDOCAINE HCL 1 % IJ SOLN
INTRAMUSCULAR | Status: AC
  Administered 2022-04-04: 10 mL
  Filled 2022-04-04: qty 20

## 2022-04-04 MED ORDER — FENTANYL CITRATE (PF) 100 MCG/2ML IJ SOLN
INTRAMUSCULAR | Status: AC
  Filled 2022-04-04: qty 4

## 2022-04-04 MED ORDER — MIDAZOLAM HCL 2 MG/2ML IJ SOLN
INTRAMUSCULAR | Status: AC
  Filled 2022-04-04: qty 4

## 2022-04-04 MED ORDER — GELATIN ABSORBABLE 12-7 MM EX MISC
CUTANEOUS | Status: AC
  Administered 2022-04-04: 1
  Filled 2022-04-04: qty 1

## 2022-04-04 MED ORDER — MIDAZOLAM HCL 2 MG/2ML IJ SOLN
INTRAMUSCULAR | Status: AC | PRN
  Administered 2022-04-04: .5 mg via INTRAVENOUS
  Administered 2022-04-04 (×2): 1 mg via INTRAVENOUS

## 2022-04-04 MED ORDER — SODIUM CHLORIDE 0.9 % IV SOLN: INTRAVENOUS | Status: DC

## 2022-04-04 NOTE — Assessment & Plan Note (Signed)
-  Liver cirrhosis and a 2.5cm mass was found in 07/2019 but she did not follow up -due to rising AFP, she had MRI on 02/06/2022 which showed extensive liver lesions (largest 13cm) throughout both lobes with marked progression from prior MRI; enlarged upper abdominal lymph nodes, no distant mets  -she is being evaluated by IR for liver targeted therapy  -she started Durvaluamb every 4 weeks, and Tremelimumab '300mg'$  once on day 1 of cycle 1, on 03/21/2022. She tolerated well

## 2022-04-04 NOTE — Discharge Instructions (Signed)
Discharge Instructions:   Please call Interventional Radiology clinic [phone] with any questions or concerns.  You may remove your dressing and shower tomorrow.  Moderate Conscious Sedation, Adult, Care After This sheet gives you information about how to care for yourself after your procedure. Your health care provider may also give you more specific instructions. If you have problems or questions, contact your health care provider. What can I expect after the procedure? After the procedure, it is common to have: Sleepiness for several hours. Impaired judgment for several hours. Difficulty with balance. Vomiting if you eat too soon. Follow these instructions at home: For the time period you were told by your health care provider: Rest. Do not participate in activities where you could fall or become injured. Do not drive or use machinery. Do not drink alcohol. Do not take sleeping pills or medicines that cause drowsiness. Do not make important decisions or sign legal documents. Do not take care of children on your own. Eating and drinking  Follow the diet recommended by your health care provider. Drink enough fluid to keep your urine pale yellow. If you vomit: Drink water, juice, or soup when you can drink without vomiting. Make sure you have little or no nausea before eating solid foods. General instructions Take over-the-counter and prescription medicines only as told by your health care provider. Have a responsible adult stay with you for the time you are told. It is important to have someone help care for you until you are awake and alert. Do not smoke. Keep all follow-up visits as told by your health care provider. This is important. Contact a health care provider if: You are still sleepy or having trouble with balance after 24 hours. You feel light-headed. You keep feeling nauseous or you keep vomiting. You develop a rash. You have a fever. You have redness or  swelling around the IV site. Get help right away if: You have trouble breathing. You have new-onset confusion at home. Summary After the procedure, it is common to feel sleepy, have impaired judgment, or feel nauseous if you eat too soon. Rest after you get home. Know the things you should not do after the procedure. Follow the diet recommended by your health care provider and drink enough fluid to keep your urine pale yellow. Get help right away if you have trouble breathing or new-onset confusion at home. This information is not intended to replace advice given to you by your health care provider. Make sure you discuss any questions you have with your health care provider. Document Revised: 08/15/2019 Document Reviewed: 03/13/2019 Elsevier Patient Education  2023 [employer]   Liver Biopsy, Care After The following information offers guidance on how to care for yourself after your procedure. Your health care provider may also give you more specific instructions. If you have problems or questions, contact your health care provider. What can I expect after the procedure? After your procedure, it is common to: Have pain and soreness in the area where the biopsy was done. Have bruising around the area where the biopsy was done. Feel tired for 1-2 days. Follow these instructions at home: Medicines Take over-the-counter and prescription medicines only as told by your health care provider. If you were prescribed an antibiotic medicine, take it as told by your health care provider. Do not stop taking the antibiotic, even if you start to feel better. Do not take medicines, such as aspirin and ibuprofen, unless your doctor tells you to take them. Ask your   health care provider if the medicine prescribed to you: Requires you to avoid driving or using machinery. Can cause constipation. You may need to take these actions to prevent or treat constipation: Drink enough fluid to keep your urine pale  yellow. Take over-the-counter or prescription medicines. Eat foods that are high in fiber, such as beans, whole grains, and fresh fruits and vegetables. Limit foods that are high in fat and processed sugars, such as fried or sweet foods. Incision care Follow instructions from your health care provider about how to take care of your incisions. Make sure you: Wash your hands with soap and water for at least 20 seconds before and after you change your bandage (dressing). If soap and water are not available, use hand sanitizer. Change your dressing as told by your health care provider. Leave stitches (sutures), skin glue, or adhesive strips in place. These skin closures may need to stay in place for 2 weeks or longer. If adhesive strip edges start to loosen and curl up, you may trim the loose edges. Do not remove adhesive strips completely unless your health care provider tells you to do that. Check your incision areas every day for signs of infection. Check for: Redness, swelling, or more pain. Fluid or blood. Warmth. Pus or a bad smell. Do not take baths, swim, or use a hot tub until your health care provider says it is okay to do so. Activity Rest at home for 1-2 days, or as directed by your health care provider. Avoid sitting for a long time without moving. Get up to take short walks every 1-2 hours. This is important to improve blood flow and breathing. Ask for help if you feel weak or unsteady. Do not lift anything that is heavier than 10 lb (4.5 kg), or the limit that your health care provider tells you, until he or she says that it is safe. Do not play contact sports for 2 weeks after the procedure. Return to your normal activities as told by your health care provider. Ask your health care provider what activities are safe for you. General instructions  Do not drink alcohol in the first week after the procedure. Plan to have a responsible adult care for you for the time you are told after  you leave the hospital or clinic. This is important. It is up to you to get the results of your procedure. Ask your health care provider, or the department that is doing the procedure, when your results will be ready. Keep all follow-up visits. This is important. Contact a health care provider if: You have increased bleeding from an incision. You have redness, swelling, or increasing pain in any incisions. You notice a discharge or a bad smell coming from any of your incisions. You develop a rash. You have a fever or chills. Get help right away if: You develop swelling, bloating, or pain in your abdomen. You become dizzy or faint. You have nausea or vomiting. You have shortness of breath or difficulty breathing. You develop chest pain. You have problems with your speech or vision. You have trouble with your balance or moving your arms or legs. These symptoms may represent a serious problem that is an emergency. Do not wait to see if the symptoms will go away. Get medical help right away. Call your local emergency services (911 in the [place] Do not drive yourself to the hospital. Summary After the liver biopsy, it is common to have pain, soreness, and bruising in the area,   as well as tiredness (fatigue). Take over-the-counter and prescription medicines only as told by your health care provider. Follow instructions from your health care provider about how to care for your incisions. Check your incision areas daily for signs of infection. This information is not intended to replace advice given to you by your health care provider. Make sure you discuss any questions you have with your health care provider. Document Revised: 03/01/2020 Document Reviewed: 03/01/2020 Elsevier Patient Education  2023 [employer] Inc.  

## 2022-04-04 NOTE — Procedures (Signed)
Interventional Radiology Procedure Note  Procedure: US Guided Biopsy of right lobe liver mass  Complications: None  Estimated Blood Loss: < 10 mL  Findings: 18 G core biopsy of right lobe liver mass performed under US guidance.  Three core samples obtained and sent to Pathology.  Venetia Night. Kathlene Cote, M.D Pager:  [phone]

## 2022-04-04 NOTE — Assessment & Plan Note (Signed)
-  PTH level low, so not primary hyperparathyroidism  -rpPTH still pending  -she received one dose pamidronate on 03/30/2022

## 2022-04-04 NOTE — Progress Notes (Unsigned)
[place]   [phone] [phone]   Clinic Follow up Note   Patient Care Team: Arthur Holms, NP as PCP - General (Nurse Practitioner) Sanda Klein, MD as PCP - Cardiology (Cardiology) Roosevelt Locks, CRNP as Nurse Practitioner (Transplant Hepatology) Sandi Mariscal, MD as Consulting Physician (Interventional Radiology) Truitt Merle, MD as Consulting Physician (Hematology and Oncology)  Date of Service:  04/05/2022  CHIEF COMPLAINT: f/u of Hepatocellular Carcinoma    CURRENT THERAPY:   Durvaluamb every 4 weeks, and Tremelimumab '300mg'$  once on day 1 of cycle 1   ASSESSMENT:  Connie [name] is a 66 y.o. female with   Hepatocellular carcinoma ([place]) -Liver cirrhosis and a 2.5cm mass was found in 07/2019 but she did not follow up -due to rising AFP, she had MRI on 02/06/2022 which showed extensive liver lesions (largest 13cm) throughout both lobes with marked progression from prior MRI; enlarged upper abdominal lymph nodes, no distant mets  -she is being evaluated by IR for liver targeted therapy  -she started Durvaluamb every 4 weeks, and Tremelimumab '300mg'$  once on day 1 of cycle 1, on 03/21/2022. She tolerated well   Alcoholic cirrhosis of liver with ascites ([place]) -f/u with liver clinic NP Heide Spark  -she also had HCV infection   Hypercalcemia -PTH level low, so not primary hyperparathyroidism  -rpPTH still pending  -she received one dose pamidronate on 03/30/2022     PLAN: -Liver Biopsy result pending, will call her with result  -.Calcium level is coming down -labs reviewed -encourage Pt to drink adequately and take multivitamin, avoid calcium supplement  -lab,and Durvaluamb 04/20/2022 -she will see endocrinology for her hypercalcemia    SUMMARY OF ONCOLOGIC HISTORY: Oncology History  Hepatocellular carcinoma ([place])  08/02/2019 Imaging   CLINICAL DATA:  Elevated LFTs   EXAM: ULTRASOUND ABDOMEN LIMITED RIGHT UPPER  QUADRANT  IMPRESSION: No gallbladder abnormality, cholelithiasis or biliary obstruction   Suboptimal exam as above.  Underlying hepatic cirrhosis suspected.   2.5 cm indeterminate central hypoechoic hepatic lesion. See above comment.   02/22/2021 Imaging   CLINICAL DATA:  Right-sided abdominal pain.   EXAM: ABDOMEN ULTRASOUND COMPLETE  IMPRESSION: 1. Cirrhosis and small ascites. 2. Gallbladder sludge. No sonographic evidence of acute cholecystitis.   02/22/2021 Imaging   CLINICAL DATA:  Abdominal pain.  Concern for diverticulitis.   EXAM: CT ABDOMEN AND PELVIS WITH CONTRAST  IMPRESSION: 1. Acute pancreatitis. No abscess or pseudocyst. 2. Cirrhosis with small ascites. 3. Colonic diverticulosis. No bowel obstruction. Normal appendix. 4. Aortic Atherosclerosis (ICD10-I70.0).   03/02/2021 Tumor Marker   CA 19-9 = 45 (^) AFP = 11 (^)   03/03/2021 Imaging   CLINICAL DATA:  Pancreatitis, worsening abdominal pain.   EXAM: CT ABDOMEN AND PELVIS WITH CONTRAST  IMPRESSION: 1. Multiple suspicious lesions in the right hepatic lobe. Patient has underlying cirrhosis and findings are concerning for multifocal hepatocellular carcinoma. Metastatic disease is also in the differential diagnosis. Recommend MRI of the abdomen with and without contrast. If the patient cannot tolerate a MRI, consider a multiphase liver CT. 2. Increased inflammatory changes in the upper abdomen compatible with pancreatitis and a large developing pseudocyst. This is developing pseudocyst measures up to 14 cm in size. 3. Cirrhosis with evidence of portal hypertension demonstrated by esophageal varices and varices draining into the IVC. 4. Indeterminate 3 mm pulmonary nodule in the lingula. Management of this pulmonary nodule depends on the etiology of the liver lesions.   03/04/2021 Imaging   EXAM: MRI ABDOMEN WITHOUT AND  WITH CONTRAST (INCLUDING MRCP)  IMPRESSION: 1. Multiple enhancing lesions are  noted throughout the right lobe of the liver, as detailed above. These have indeterminate imaging characteristics. Given the background of hepatic cirrhosis, the possibility of multifocal infiltrative hepatocellular carcinoma should be considered, although none of these lesions meet definite imaging criteria to establish that diagnosis. Correlation with AFP level is recommended. Other differential considerations include malignant and benign etiologies such as fibrolamellar hepatocellular carcinoma and multifocal focal nodular hyperplasia ([place]). Consideration for repeat abdominal MRI with and without IV gadolinium (Eovist) is also suggested, although biopsy may ultimately be required to establish a tissue diagnosis. 2. 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 3. Trace volume of perihepatic ascites.   09/22/2021 Tumor Marker   CA 19-9 = 190 (^) AFP = 124 (^)   01/19/2022 Tumor Marker   CA 19-9 = 816 (^) AFP = 307 (^)   02/06/2022 Imaging   EXAM: MRI ABDOMEN WITHOUT AND WITH CONTRAST (INCLUDING MRCP)  IMPRESSION: 1. Morphologic features of the liver compatible with cirrhosis. 2. Extensive enhancing liver lesions are identified throughout both lobes of liver which demonstrate marked progression compared with the previous exam. Enhancing lesions demonstrate washout on the delayed images. In the setting of cirrhosis with progressively elevated alpha fetoprotein levels imaging findings are highly worrisome for multifocal hepatocellular carcinoma. 3. Enlarged upper abdominal lymph nodes identified. Cannot exclude underlying nodal metastasis. 4. There are several lesions within the central mesentery in the distribution of previous large pseudo cysts. The largest measures 5.0 x 3.1 cm. This is favored to represent sequelae of prior hemorrhagic pancreatitis with pseudocyst formation.   02/23/2022 Initial Diagnosis   Hepatocellular  carcinoma ([place])   03/20/2022 Cancer Staging   Staging form: Liver, AJCC 8th Edition - Clinical stage from 03/20/2022: Stage IVA (cT3, cN1, cM0) - Signed by Truitt Merle, MD on 03/20/2022   03/21/2022 -  Chemotherapy   Patient is on Treatment Plan : LUNG NSCLC Durvalumab (1500) q28d        INTERVAL HISTORY:  Anyssa Sharpless is here for a follow up of Hepatocellular Carcinoma    She was last seen by me on 03/21/2022 She presents to the clinic accompanied with her husband. Pt states the treatment went fine.Pt reports of being nausea this morning and took something for it Zofran.     All other systems were reviewed with the patient and are negative.  MEDICAL HISTORY:  Past Medical History:  Diagnosis Date   Alcohol abuse    Helicobacter pylori gastritis 08/07/2019   Hepatitis C antibody positive in blood - negative RNA    Hypertension     SURGICAL HISTORY: Past Surgical History:  Procedure Laterality Date   BIOPSY  08/03/2019   Procedure: BIOPSY;  Surgeon: Gatha Mayer, MD;  Location: [facility] ENDOSCOPY;  Service: Endoscopy;;   ESOPHAGOGASTRODUODENOSCOPY (EGD) WITH PROPOFOL N/A 08/03/2019   Procedure: ESOPHAGOGASTRODUODENOSCOPY (EGD) WITH PROPOFOL;  Surgeon: Gatha Mayer, MD;  Location: [facility] ENDOSCOPY;  Service: Endoscopy;  Laterality: N/A;   IR RADIOLOGIST EVAL & MGMT  02/14/2022   IR RADIOLOGIST EVAL & MGMT  03/14/2022    I have reviewed the social history and family history with the patient and they are unchanged from previous note.  ALLERGIES:  has No Known Allergies.  MEDICATIONS:  Current Outpatient Medications  Medication Sig Dispense Refill   acetaminophen (TYLENOL) 500 MG tablet Take 1,000 mg by mouth every 6 (six) hours as needed for mild pain.  co-enzyme Q-10 30 MG capsule Take 30 mg by mouth 3 (three) times a week.     diclofenac Sodium (VOLTAREN) 1 % GEL Apply 2 g topically 2 (two) times daily as needed (pain).     gabapentin (NEURONTIN) 100 MG capsule Take 1  capsule (100 mg total) by mouth 3 (three) times daily. 90 capsule 0   lipase/protease/amylase (CREON) 12000-38000 units CPEP capsule Take 1 capsule (12,000 Units total) by mouth 3 (three) times daily with meals. 270 capsule 0   metoprolol succinate (TOPROL-XL) 25 MG 24 hr tablet Take 1 tablet (25 mg total) by mouth daily. 30 tablet 1   nicotine (NICODERM CQ - DOSED IN MG/24 HOURS) 14 mg/24hr patch Place 1 patch (14 mg total) onto the skin daily as needed (smoking cessation). 28 patch 0   Omega-3 Fatty Acids (FISH OIL) 1000 MG CAPS Take 1,000 mg by mouth daily.     ondansetron (ZOFRAN) 8 MG tablet Take 1 tablet (8 mg total) by mouth every 8 (eight) hours as needed for nausea or vomiting. 20 tablet 0   pantoprazole (PROTONIX) 40 MG tablet Take 1 tablet (40 mg total) by mouth 2 (two) times daily. (Patient taking differently: Take 40 mg by mouth daily.) 60 tablet 1   polyethylene glycol (MIRALAX / GLYCOLAX) 17 g packet Take 17 g by mouth 2 (two) times daily. 14 each 0   prochlorperazine (COMPAZINE) 10 MG tablet Take 1 tablet (10 mg total) by mouth every 6 (six) hours as needed for nausea or vomiting. 30 tablet 0   No current facility-administered medications for this visit.    PHYSICAL EXAMINATION: ECOG PERFORMANCE STATUS: 1 - Symptomatic but completely ambulatory  Vitals:   04/05/22 0939  BP: (!) 159/90  Pulse: 80  Resp: 18  Temp: 98.1 F (36.7 C)  SpO2: 99%   Wt Readings from Last 3 Encounters:  04/05/22 228 lb 3.2 oz (103.5 kg)  04/04/22 232 lb (105.2 kg)  03/21/22 234 lb 12 oz (106.5 kg)    GENERAL:alert, no distress and comfortable SKIN: skin color, texture, turgor are normal, no rashes or significant lesions EYES: normal, Conjunctiva are pink and non-injected, sclera clear NECK: supple, thyroid normal size, non-tender, without nodularity LYMPH:  no palpable lymphadenopathy in the cervical, axillary  LUNGS: clear to auscultation and percussion with normal breathing effort HEART:  regular rate & rhythm and no murmurs and no lower extremity edema ABDOMEN:abdomen soft, tender and normal bowel sounds Musculoskeletal:no cyanosis of digits and no clubbing  NEURO: alert & oriented x 3 with fluent speech, no focal motor/sensory deficits  LABORATORY DATA:  I have reviewed the data as listed    Latest Ref Rng & Units 04/05/2022    9:05 AM 04/04/2022   11:10 AM 03/30/2022   10:51 AM  CBC  WBC 4.0 - 10.5 K/uL 10.0  10.0  10.2   Hemoglobin 12.0 - 15.0 g/dL 15.4  15.2  15.3   Hematocrit 36.0 - 46.0 % 45.0  46.3  45.1   Platelets 150 - 400 K/uL 209  216  215         Latest Ref Rng & Units 04/05/2022    9:05 AM 04/04/2022   11:10 AM 03/30/2022   10:51 AM  CMP  Glucose 70 - 99 mg/dL 106  92  112   BUN 8 - 23 mg/dL '13  15  19   '$ Creatinine 0.44 - 1.00 mg/dL 1.33  1.47  1.51   Sodium 135 - 145 mmol/L 138  139  137   Potassium 3.5 - 5.1 mmol/L 4.1  3.9  3.9   Chloride 98 - 111 mmol/L 106  110  105   CO2 22 - 32 mmol/L '26  21  24   '$ Calcium 8.9 - 10.3 mg/dL 12.4  10.4  11.1    11.8   Total Protein 6.5 - 8.1 g/dL 8.4  8.0  8.6   Total Bilirubin 0.3 - 1.2 mg/dL 0.7  0.7  0.6   Alkaline Phos 38 - 126 U/L 168  140  150   AST 15 - 41 U/L 96  91  69   ALT 0 - 44 U/L 47  57  71       RADIOGRAPHIC STUDIES: I have personally reviewed the radiological images as listed and agreed with the findings in the report. US BIOPSY (LIVER)  Result Date: 04/04/2022 INDICATION: Large mass of right lobe of liver and cirrhosis with presumed hepatocellular carcinoma. There is concern for potential cholangiocarcinoma as well based on tumor markers and biopsy has been requested. EXAM: ULTRASOUND GUIDED CORE BIOPSY OF LIVER MASS MEDICATIONS: None. ANESTHESIA/SEDATION: Moderate (conscious) sedation was employed during this procedure. A total of Versed 2.5 mg and Fentanyl 100 mcg was administered intravenously by radiology nursing. Moderate Sedation Time: 12 minutes. The patient's level of  consciousness and vital signs were monitored continuously by radiology nursing throughout the procedure under my direct supervision. PROCEDURE: The procedure, risks, benefits, and alternatives were explained to the patient. Questions regarding the procedure were encouraged and answered. The patient understands and consents to the procedure. A time-out was performed prior to initiating the procedure. Ultrasound was used to localize a right lobe hepatic mass. The abdominal wall was prepped with chlorhexidine in a sterile fashion, and a sterile drape was applied covering the operative field. A sterile gown and sterile gloves were used for the procedure. Local anesthesia was provided with 1% Lidocaine. A 17 gauge trocar needle was advanced into the right lobe of the liver at the level of a hepatic mass. After confirming needle tip position, coaxial 18 gauge core biopsy samples were obtained. Three core biopsy samples were submitted in formalin. Gel-Foam pledgets were advanced through the outer needle as the needle was retracted and removed. Additional ultrasound was performed. COMPLICATIONS: None immediate. FINDINGS: Large, heterogeneous and ill-defined mass in the inferior right lobe of the liver measures at least 11.5 cm in diameter by ultrasound. Solid core biopsy samples were obtained. IMPRESSION: Ultrasound-guided core biopsy performed of a large, ill-defined mass in the inferior right lobe of the liver. Electronically Signed   By: Aletta Edouard M.D.   On: 04/04/2022 15:40      No orders of the defined types were placed in this encounter.  All questions were answered. The patient knows to call the clinic with any problems, questions or concerns. No barriers to learning was detected. The total time spent in the appointment was 30 minutes.     Truitt Merle, MD 04/05/2022   Felicity Coyer, CMA, am acting as scribe for Truitt Merle, MD.   I have reviewed the above documentation for accuracy and  completeness, and I agree with the above.

## 2022-04-04 NOTE — Progress Notes (Signed)
Called pt to introduce myself as her Financial Resource Specialist and to discuss the [employer] grant.  I left a msg requesting she return my call if she's interested in applying for the grant.  ?

## 2022-04-04 NOTE — Assessment & Plan Note (Addendum)
-  f/u with liver clinic NP Heide Spark  -she also had HCV infection

## 2022-04-05 ENCOUNTER — Encounter: Payer: Self-pay | Admitting: Hematology

## 2022-04-05 ENCOUNTER — Inpatient Hospital Stay: Payer: 59 | Attending: Hematology

## 2022-04-05 ENCOUNTER — Inpatient Hospital Stay (HOSPITAL_BASED_OUTPATIENT_CLINIC_OR_DEPARTMENT_OTHER): Payer: 59 | Admitting: Hematology

## 2022-04-05 VITALS — BP 159/90 | HR 80 | Temp 98.1°F | Resp 18 | Ht 65.0 in | Wt 228.2 lb

## 2022-04-05 DIAGNOSIS — K863 Pseudocyst of pancreas: Secondary | ICD-10-CM | POA: Insufficient documentation

## 2022-04-05 DIAGNOSIS — Z791 Long term (current) use of non-steroidal anti-inflammatories (NSAID): Secondary | ICD-10-CM | POA: Diagnosis not present

## 2022-04-05 DIAGNOSIS — I700 Atherosclerosis of aorta: Secondary | ICD-10-CM | POA: Insufficient documentation

## 2022-04-05 DIAGNOSIS — K7031 Alcoholic cirrhosis of liver with ascites: Secondary | ICD-10-CM | POA: Insufficient documentation

## 2022-04-05 DIAGNOSIS — K5730 Diverticulosis of large intestine without perforation or abscess without bleeding: Secondary | ICD-10-CM | POA: Diagnosis not present

## 2022-04-05 DIAGNOSIS — K859 Acute pancreatitis without necrosis or infection, unspecified: Secondary | ICD-10-CM | POA: Diagnosis not present

## 2022-04-05 DIAGNOSIS — I10 Essential (primary) hypertension: Secondary | ICD-10-CM | POA: Insufficient documentation

## 2022-04-05 DIAGNOSIS — Z79899 Other long term (current) drug therapy: Secondary | ICD-10-CM | POA: Diagnosis not present

## 2022-04-05 DIAGNOSIS — C221 Intrahepatic bile duct carcinoma: Secondary | ICD-10-CM | POA: Insufficient documentation

## 2022-04-05 DIAGNOSIS — C220 Liver cell carcinoma: Secondary | ICD-10-CM

## 2022-04-05 DIAGNOSIS — F1010 Alcohol abuse, uncomplicated: Secondary | ICD-10-CM | POA: Diagnosis not present

## 2022-04-05 LAB — CBC WITH DIFFERENTIAL/PLATELET
Abs Immature Granulocytes: 0.02 10*3/uL (ref 0.00–0.07)
Basophils Absolute: 0.1 10*3/uL (ref 0.0–0.1)
Basophils Relative: 1 %
Eosinophils Absolute: 0.4 10*3/uL (ref 0.0–0.5)
Eosinophils Relative: 4 %
HCT: 45 % (ref 36.0–46.0)
Hemoglobin: 15.4 g/dL — ABNORMAL HIGH (ref 12.0–15.0)
Immature Granulocytes: 0 %
Lymphocytes Relative: 26 %
Lymphs Abs: 2.6 10*3/uL (ref 0.7–4.0)
MCH: 33.9 pg (ref 26.0–34.0)
MCHC: 34.2 g/dL (ref 30.0–36.0)
MCV: 99.1 fL (ref 80.0–100.0)
Monocytes Absolute: 0.7 10*3/uL (ref 0.1–1.0)
Monocytes Relative: 7 %
Neutro Abs: 6.2 10*3/uL (ref 1.7–7.7)
Neutrophils Relative %: 62 %
Platelets: 209 10*3/uL (ref 150–400)
RBC: 4.54 MIL/uL (ref 3.87–5.11)
RDW: 14.4 % (ref 11.5–15.5)
WBC: 10 10*3/uL (ref 4.0–10.5)
nRBC: 0 % (ref 0.0–0.2)

## 2022-04-05 LAB — COMPREHENSIVE METABOLIC PANEL
ALT: 47 U/L — ABNORMAL HIGH (ref 0–44)
AST: 96 U/L — ABNORMAL HIGH (ref 15–41)
Albumin: 3.9 g/dL (ref 3.5–5.0)
Alkaline Phosphatase: 168 U/L — ABNORMAL HIGH (ref 38–126)
Anion gap: 6 (ref 5–15)
BUN: 13 mg/dL (ref 8–23)
CO2: 26 mmol/L (ref 22–32)
Calcium: 12.4 mg/dL — ABNORMAL HIGH (ref 8.9–10.3)
Chloride: 106 mmol/L (ref 98–111)
Creatinine, Ser: 1.33 mg/dL — ABNORMAL HIGH (ref 0.44–1.00)
GFR, Estimated: 44 mL/min — ABNORMAL LOW (ref >60)
Glucose, Bld: 106 mg/dL — ABNORMAL HIGH (ref 70–99)
Potassium: 4.1 mmol/L (ref 3.5–5.1)
Sodium: 138 mmol/L (ref 135–145)
Total Bilirubin: 0.7 mg/dL (ref 0.3–1.2)
Total Protein: 8.4 g/dL — ABNORMAL HIGH (ref 6.5–8.1)

## 2022-04-05 LAB — PTH-RELATED PEPTIDE: PTH-related peptide: <2 pmol/L

## 2022-04-05 NOTE — Progress Notes (Signed)
Pt returned my call and is interested in applying for the [employer] so she will bring proof of income on 04/20/22.  If approved I will give her an expense sheet and my card for any questions or concerns she may have in the future.

## 2022-04-10 LAB — PTH-RELATED PEPTIDE: PTH-related peptide: <2 pmol/L

## 2022-04-12 LAB — SURGICAL PATHOLOGY

## 2022-04-19 NOTE — Progress Notes (Addendum)
[place]   [phone]) [phone] Fax:(336) [phone]   Clinic Follow up Note   Patient Care Team: Arthur Holms, NP as PCP - General (Nurse Practitioner) Sanda Klein, MD as PCP - Cardiology (Cardiology) Roosevelt Locks, CRNP as Nurse Practitioner (Transplant Hepatology) Sandi Mariscal, MD as Consulting Physician (Interventional Radiology) Truitt Merle, MD as Consulting Physician (Hematology and Oncology) 04/20/2022  CHIEF COMPLAINT: Follow-up [place]  SUMMARY OF ONCOLOGIC HISTORY: Oncology History  Hepatocellular carcinoma ([place])  08/02/2019 Imaging   CLINICAL DATA:  Elevated LFTs   EXAM: ULTRASOUND ABDOMEN LIMITED RIGHT UPPER QUADRANT  IMPRESSION: No gallbladder abnormality, cholelithiasis or biliary obstruction   Suboptimal exam as above.  Underlying hepatic cirrhosis suspected.   2.5 cm indeterminate central hypoechoic hepatic lesion. See above comment.   02/22/2021 Imaging   CLINICAL DATA:  Right-sided abdominal pain.   EXAM: ABDOMEN ULTRASOUND COMPLETE  IMPRESSION: 1. Cirrhosis and small ascites. 2. Gallbladder sludge. No sonographic evidence of acute cholecystitis.   02/22/2021 Imaging   CLINICAL DATA:  Abdominal pain.  Concern for diverticulitis.   EXAM: CT ABDOMEN AND PELVIS WITH CONTRAST  IMPRESSION: 1. Acute pancreatitis. No abscess or pseudocyst. 2. Cirrhosis with small ascites. 3. Colonic diverticulosis. No bowel obstruction. Normal appendix. 4. Aortic Atherosclerosis (ICD10-I70.0).   03/02/2021 Tumor Marker   CA 19-9 = 45 (^) AFP = 11 (^)   03/03/2021 Imaging   CLINICAL DATA:  Pancreatitis, worsening abdominal pain.   EXAM: CT ABDOMEN AND PELVIS WITH CONTRAST  IMPRESSION: 1. Multiple suspicious lesions in the right hepatic lobe. Patient has underlying cirrhosis and findings are concerning for multifocal hepatocellular carcinoma. Metastatic disease is also in the differential diagnosis. Recommend MRI of the abdomen with and without  contrast. If the patient cannot tolerate a MRI, consider a multiphase liver CT. 2. Increased inflammatory changes in the upper abdomen compatible with pancreatitis and a large developing pseudocyst. This is developing pseudocyst measures up to 14 cm in size. 3. Cirrhosis with evidence of portal hypertension demonstrated by esophageal varices and varices draining into the IVC. 4. Indeterminate 3 mm pulmonary nodule in the lingula. Management of this pulmonary nodule depends on the etiology of the liver lesions.   03/04/2021 Imaging   EXAM: MRI ABDOMEN WITHOUT AND WITH CONTRAST (INCLUDING MRCP)  IMPRESSION: 1. Multiple enhancing lesions are noted throughout the right lobe of the liver, as detailed above. These have indeterminate imaging characteristics. Given the background of hepatic cirrhosis, the possibility of multifocal infiltrative hepatocellular carcinoma should be considered, although none of these lesions meet definite imaging criteria to establish that diagnosis. Correlation with AFP level is recommended. Other differential considerations include malignant and benign etiologies such as fibrolamellar hepatocellular carcinoma and multifocal focal nodular hyperplasia ([place]). Consideration for repeat abdominal MRI with and without IV gadolinium (Eovist) is also suggested, although biopsy may ultimately be required to establish a tissue diagnosis. 2. 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 3. Trace volume of perihepatic ascites.   09/22/2021 Tumor Marker   CA 19-9 = 190 (^) AFP = 124 (^)   01/19/2022 Tumor Marker   CA 19-9 = 816 (^) AFP = 307 (^)   02/06/2022 Imaging   EXAM: MRI ABDOMEN WITHOUT AND WITH CONTRAST (INCLUDING MRCP)  IMPRESSION: 1. Morphologic features of the liver compatible with cirrhosis. 2. Extensive enhancing liver lesions are identified throughout both lobes of liver which demonstrate marked  progression compared with the previous exam. Enhancing lesions demonstrate washout on the delayed images. In the  setting of cirrhosis with progressively elevated alpha fetoprotein levels imaging findings are highly worrisome for multifocal hepatocellular carcinoma. 3. Enlarged upper abdominal lymph nodes identified. Cannot exclude underlying nodal metastasis. 4. There are several lesions within the central mesentery in the distribution of previous large pseudo cysts. The largest measures 5.0 x 3.1 cm. This is favored to represent sequelae of prior hemorrhagic pancreatitis with pseudocyst formation.   02/23/2022 Initial Diagnosis   Hepatocellular carcinoma ([place])   03/20/2022 Cancer Staging   Staging form: Liver, AJCC 8th Edition - Clinical stage from 03/20/2022: Stage IVA (cT3, cN1, cM0) - Signed by Truitt Merle, MD on 03/20/2022   03/21/2022 -  Chemotherapy   Patient is on Treatment Plan : LUNG NSCLC Durvalumab (1500) q28d     04/04/2022 Pathology Results   A. LIVER, RIGHT LOBE, BIOPSY: Well to moderately differentiated CK7 positive adenocarcinoma (see comment)  COMMENT:  Given the cytohistomorphology and this immunohistochemical pattern of cytokeratin 7 positivity, the differential diagnosis would include a pancreaticobiliary or upper gastrointestinal intestinal tract primary or possibly a lung primary (not favored).      CURRENT THERAPY: Durvaluamb every 4 weeks, and Tremelimumab '300mg'$  once on day 1 of cycle 1   INTERVAL HISTORY: [name] returns with her spouse for follow-up and treatment as scheduled.  Did well with treatment, denies any side effects.  Energy and appetite fluctuate but overall adequate.  Energy is constipation with MiraLAX.  Denies abdominal pain or bloating, N/V, reflux, fever, chills, cough, chest pain, dyspnea, leg edema, rash, or any other new specific complaints..  All other systems were reviewed with the patient and are negative.  MEDICAL HISTORY:  Past  Medical History:  Diagnosis Date   Alcohol abuse    Helicobacter pylori gastritis 08/07/2019   Hepatitis C antibody positive in blood - negative RNA    Hypertension     SURGICAL HISTORY: Past Surgical History:  Procedure Laterality Date   BIOPSY  08/03/2019   Procedure: BIOPSY;  Surgeon: Gatha Mayer, MD;  Location: [facility] ENDOSCOPY;  Service: Endoscopy;;   ESOPHAGOGASTRODUODENOSCOPY (EGD) WITH PROPOFOL N/A 08/03/2019   Procedure: ESOPHAGOGASTRODUODENOSCOPY (EGD) WITH PROPOFOL;  Surgeon: Gatha Mayer, MD;  Location: [facility] ENDOSCOPY;  Service: Endoscopy;  Laterality: N/A;   IR RADIOLOGIST EVAL & MGMT  02/14/2022   IR RADIOLOGIST EVAL & MGMT  03/14/2022    I have reviewed the social history and family history with the patient and they are unchanged from previous note.  ALLERGIES:  has No Known Allergies.  MEDICATIONS:  Current Outpatient Medications  Medication Sig Dispense Refill   acetaminophen (TYLENOL) 500 MG tablet Take 1,000 mg by mouth every 6 (six) hours as needed for mild pain.     diclofenac Sodium (VOLTAREN) 1 % GEL Apply 2 g topically 2 (two) times daily as needed (pain).     lipase/protease/amylase (CREON) 12000-38000 units CPEP capsule Take 1 capsule (12,000 Units total) by mouth 3 (three) times daily with meals. 270 capsule 0   ondansetron (ZOFRAN) 8 MG tablet Take 1 tablet (8 mg total) by mouth every 8 (eight) hours as needed for nausea or vomiting. 20 tablet 0   polyethylene glycol (MIRALAX / GLYCOLAX) 17 g packet Take 17 g by mouth 2 (two) times daily. 14 each 0   prochlorperazine (COMPAZINE) 10 MG tablet Take 1 tablet (10 mg total) by mouth every 6 (six) hours as needed for nausea or vomiting. 30 tablet 0   co-enzyme Q-10 30 MG capsule Take 30 mg by mouth 3 (  three) times a week.     gabapentin (NEURONTIN) 100 MG capsule Take 1 capsule (100 mg total) by mouth 3 (three) times daily. 90 capsule 0   metoprolol succinate (TOPROL-XL) 25 MG 24 hr tablet Take 1 tablet (25 mg  total) by mouth daily. (Patient not taking: Reported on 04/20/2022) 30 tablet 1   nicotine (NICODERM CQ - DOSED IN MG/24 HOURS) 14 mg/24hr patch Place 1 patch (14 mg total) onto the skin daily as needed (smoking cessation). 28 patch 0   Omega-3 Fatty Acids (FISH OIL) 1000 MG CAPS Take 1,000 mg by mouth daily.     pantoprazole (PROTONIX) 40 MG tablet Take 1 tablet (40 mg total) by mouth 2 (two) times daily. (Patient taking differently: Take 40 mg by mouth daily.) 60 tablet 1   No current facility-administered medications for this visit.   Facility-Administered Medications Ordered in Other Visits  Medication Dose Route Frequency Provider Last Rate Last Admin   0.9 %  sodium chloride infusion   Intravenous Continuous Truitt Merle, MD 500 mL/hr at 04/20/22 1122 New Bag at 04/20/22 1122    PHYSICAL EXAMINATION: ECOG PERFORMANCE STATUS: 0 - Asymptomatic  Vitals:   04/20/22 0929  BP: (!) 143/92  Pulse: 83  Resp: (!) 22  Temp: 98.1 F (36.7 C)  SpO2: 100%   Filed Weights   04/20/22 0929  Weight: 232 lb 6.4 oz (105.4 kg)    GENERAL:alert, no distress and comfortable SKIN: no rash  EYES:  sclera clear LUNGS: clear with normal breathing effort HEART: regular rate & rhythm, no lower extremity edema ABDOMEN:abdomen soft, non-tender and normal bowel sounds NEURO: alert & oriented x 3 with fluent speech, no focal motor/sensory deficits   LABORATORY DATA:  I have reviewed the data as listed    Latest Ref Rng & Units 04/20/2022    9:03 AM 04/05/2022    9:05 AM 04/04/2022   11:10 AM  CBC  WBC 4.0 - 10.5 K/uL 10.1  10.0  10.0   Hemoglobin 12.0 - 15.0 g/dL 15.7  15.4  15.2   Hematocrit 36.0 - 46.0 % 45.9  45.0  46.3   Platelets 150 - 400 K/uL 195  209  216         Latest Ref Rng & Units 04/20/2022    9:03 AM 04/05/2022    9:05 AM 04/04/2022   11:10 AM  CMP  Glucose 70 - 99 mg/dL 102  106  92   BUN 8 - 23 mg/dL '13  13  15   '$ Creatinine 0.44 - 1.00 mg/dL 1.48  1.33  1.47   Sodium 135  - 145 mmol/L 139  138  139   Potassium 3.5 - 5.1 mmol/L 4.1  4.1  3.9   Chloride 98 - 111 mmol/L 107  106  110   CO2 22 - 32 mmol/L '25  26  21   '$ Calcium 8.9 - 10.3 mg/dL 12.2  12.4  10.4   Total Protein 6.5 - 8.1 g/dL 8.5  8.4  8.0   Total Bilirubin 0.3 - 1.2 mg/dL 0.6  0.7  0.7   Alkaline Phos 38 - 126 U/L 171  168  140   AST 15 - 41 U/L 98  96  91   ALT 0 - 44 U/L 34  47  57       RADIOGRAPHIC STUDIES: I have personally reviewed the radiological images as listed and agreed with the findings in the report. No results found.   ASSESSMENT &  PLAN: Connie [name] is a 66 y.o. 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  -Although she had typical image findings of [place], she underwent liver biopsy since CA 19-9 was also elevated  -Connie [name] appears stable. She tolerated treatment very well, no side effects.  -We reviewed her biopsy today which shows well to moderately differentiated CK7 positive adenocarcinoma; the IHC staining profile includes pancreaticobiliary or upper GI primary, and possibly lung but not favored. -She has no GI symptoms and agrees to see Dr. 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  -Pt Seen with Dr. Burr Medico who recommends, if EGD is negative, to continue durvalumab and to add oxaliplatin/gemcitabine every 2 weeks --Chemotherapy consent: Side effects including but not limited to fatigue, low appetite, nausea, vomiting, diarrhea, hair thinning/loss, neuropathy, fluid  retention, renal and liver dysfunction, neutropenic fever, need for blood transfusion, bleeding, were discussed with patient in great detail. She agrees to proceed. The treatment goal remains palliative -We discussed a port, she is reluctant but will think about it -Labs reviewed, adequate to proceed with durvalumab today as planned. Will add on chemo when she returns for f/up and next cycle in 4 weeks   Alcoholic cirrhosis of liver with ascites ([place]) -f/u with liver clinic NP Heide Spark  -she also had HCV infection    Hypercalcemia -PTH level low, so not primary hyperparathyroidism  -rpPTH normal -she received one dose pamidronate on 03/30/2022, ca improved some -Ca back to 12.2 today, she will receive extra NS today and return for second dose Aredia next week     PLAN: -Labs and biopsy reviewed, likely mixed HCC and cholangio -Proceed with imfinzi today as planned -Calcium 12.2, pt did not want to stay longer for Aredia, will give 500 cc NS today and schedule Aredia next week -EGD with Dr. Carlean Purl to r/o upper GI primary and check for esophageal varices, I sent him a message today -If EGD negative, recommend to add on oxaliplatin/gem every 2 weeks and continue durval q28 days. Goal is palliative. Pt consented.  -F/up and start with next cycle in 4 weeks  -Pt will think about a port -Seen with Dr. Burr Medico    All questions were answered. The patient knows to call the clinic with any problems, questions or concerns. No barriers to learning were detected.      Alla Feeling, NP 04/20/22    Addendum  I have seen the patient, examined her. I agree with the assessment and and plan and have edited the notes.   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  Since cholangiocarcinoma and upper GI carcinoma at difficult to differentiate  on IHC, I also recommend up endoscopy to rule out esophageal or gastric primary, although my suspicion is low given lack of clinical symptoms.  I recommend adding chemotherapy oxaliplatin and gemcitabine to durvalumab, due to the component  of cholangiocarcinoma.  Due to her stage III CKD, she is not a candidate for cisplatin.  Chemo consent was obtained today.  Patient was overwhelmed and upset that she would need chemotherapy, agreed to proceed.  Will send a message to her GI doctor For EGD in the Next 3 Weeks.  Will Proceed with Durvalumab Today, start chemo next cycle in 3 weeks. All questions were answered.   Truitt Merle MD 04/20/2022

## 2022-04-20 ENCOUNTER — Other Ambulatory Visit: Payer: Self-pay

## 2022-04-20 ENCOUNTER — Encounter: Payer: Self-pay | Admitting: Nurse Practitioner

## 2022-04-20 ENCOUNTER — Inpatient Hospital Stay: Payer: 59

## 2022-04-20 ENCOUNTER — Inpatient Hospital Stay (HOSPITAL_BASED_OUTPATIENT_CLINIC_OR_DEPARTMENT_OTHER): Payer: 59 | Admitting: Nurse Practitioner

## 2022-04-20 ENCOUNTER — Encounter: Payer: Self-pay | Admitting: General Practice

## 2022-04-20 ENCOUNTER — Inpatient Hospital Stay: Payer: 59 | Admitting: Nurse Practitioner

## 2022-04-20 ENCOUNTER — Encounter: Payer: Self-pay | Admitting: Hematology

## 2022-04-20 VITALS — BP 149/80 | HR 86 | Temp 97.9°F | Resp 18

## 2022-04-20 VITALS — BP 143/92 | HR 83 | Temp 98.1°F | Resp 22 | Wt 232.4 lb

## 2022-04-20 DIAGNOSIS — C220 Liver cell carcinoma: Secondary | ICD-10-CM | POA: Diagnosis not present

## 2022-04-20 DIAGNOSIS — C221 Intrahepatic bile duct carcinoma: Secondary | ICD-10-CM

## 2022-04-20 LAB — CMP (CANCER CENTER ONLY)
ALT: 34 U/L (ref 0–44)
AST: 98 U/L — ABNORMAL HIGH (ref 15–41)
Albumin: 3.8 g/dL (ref 3.5–5.0)
Alkaline Phosphatase: 171 U/L — ABNORMAL HIGH (ref 38–126)
Anion gap: 7 (ref 5–15)
BUN: 13 mg/dL (ref 8–23)
CO2: 25 mmol/L (ref 22–32)
Calcium: 12.2 mg/dL — ABNORMAL HIGH (ref 8.9–10.3)
Chloride: 107 mmol/L (ref 98–111)
Creatinine: 1.48 mg/dL — ABNORMAL HIGH (ref 0.44–1.00)
GFR, Estimated: 39 mL/min — ABNORMAL LOW (ref >60)
Glucose, Bld: 102 mg/dL — ABNORMAL HIGH (ref 70–99)
Potassium: 4.1 mmol/L (ref 3.5–5.1)
Sodium: 139 mmol/L (ref 135–145)
Total Bilirubin: 0.6 mg/dL (ref 0.3–1.2)
Total Protein: 8.5 g/dL — ABNORMAL HIGH (ref 6.5–8.1)

## 2022-04-20 LAB — CBC WITH DIFFERENTIAL (CANCER CENTER ONLY)
Abs Immature Granulocytes: 0.02 10*3/uL (ref 0.00–0.07)
Basophils Absolute: 0.1 10*3/uL (ref 0.0–0.1)
Basophils Relative: 1 %
Eosinophils Absolute: 0.7 10*3/uL — ABNORMAL HIGH (ref 0.0–0.5)
Eosinophils Relative: 7 %
HCT: 45.9 % (ref 36.0–46.0)
Hemoglobin: 15.7 g/dL — ABNORMAL HIGH (ref 12.0–15.0)
Immature Granulocytes: 0 %
Lymphocytes Relative: 30 %
Lymphs Abs: 3 10*3/uL (ref 0.7–4.0)
MCH: 33.7 pg (ref 26.0–34.0)
MCHC: 34.2 g/dL (ref 30.0–36.0)
MCV: 98.5 fL (ref 80.0–100.0)
Monocytes Absolute: 1.1 10*3/uL — ABNORMAL HIGH (ref 0.1–1.0)
Monocytes Relative: 11 %
Neutro Abs: 5.2 10*3/uL (ref 1.7–7.7)
Neutrophils Relative %: 51 %
Platelet Count: 195 10*3/uL (ref 150–400)
RBC: 4.66 MIL/uL (ref 3.87–5.11)
RDW: 14.3 % (ref 11.5–15.5)
WBC Count: 10.1 10*3/uL (ref 4.0–10.5)
nRBC: 0 % (ref 0.0–0.2)

## 2022-04-20 MED ORDER — SODIUM CHLORIDE 0.9 % IV SOLN: Freq: Once | INTRAVENOUS | Status: AC

## 2022-04-20 MED ORDER — SODIUM CHLORIDE 0.9 % IV SOLN: INTRAVENOUS | Status: DC

## 2022-04-20 MED ORDER — SODIUM CHLORIDE 0.9 % IV SOLN
1500.0000 mg | Freq: Once | INTRAVENOUS | Status: AC
  Administered 2022-04-20: 1500 mg via INTRAVENOUS
  Filled 2022-04-20: qty 30

## 2022-04-20 NOTE — Progress Notes (Signed)
[facility] Spiritual Care Note  Met with Ms Wonda Amis and her husband in infusion per referral from nursing. Ms Wonda Amis was intermittently tearful about the update regarding her diagnosis and welcomed Spiritual Care with hugs and gratitude.  Brought them coordinating handmade blessing blankets as tangible signs of comfort and encouragement, as well as a full packet of [place] and [employer] support programming. Provided empathic listening, normalization of feelings, and emotional support.  Mr Wonda Amis is very interested in [place], and couple plans to explore other support programming as well.  We plan to speak by phone in the new year and to follow up in person at infusion on 1/17.   [place], [place], [facility] Pager [phone] Voicemail [phone]

## 2022-04-20 NOTE — Patient Instructions (Signed)
[facility] MEDICAL ONCOLOGY  Discharge Instructions: Thank you for choosing [facility] to provide your oncology and hematology care.   If you have a lab appointment with the Cancer Center, please go directly to the Cancer Center and check in at the registration area.   Wear comfortable clothing and clothing appropriate for easy access to any Portacath or PICC line.   We strive to give you quality time with your provider. You may need to reschedule your appointment if you arrive late (15 or more minutes).  Arriving late affects you and other patients whose appointments are after yours.  Also, if you miss three or more appointments without notifying the office, you may be dismissed from the clinic at the provider's discretion.      For prescription refill requests, have your pharmacy contact our office and allow 72 hours for refills to be completed.    Today you received the following chemotherapy and/or immunotherapy agents: Durvalumab      To help prevent nausea and vomiting after your treatment, we encourage you to take your nausea medication as directed.  BELOW ARE SYMPTOMS THAT SHOULD BE REPORTED IMMEDIATELY: *FEVER GREATER THAN 100.4 F (38 C) OR HIGHER *CHILLS OR SWEATING *NAUSEA AND VOMITING THAT IS NOT CONTROLLED WITH YOUR NAUSEA MEDICATION *UNUSUAL SHORTNESS OF BREATH *UNUSUAL BRUISING OR BLEEDING *URINARY PROBLEMS (pain or burning when urinating, or frequent urination) *BOWEL PROBLEMS (unusual diarrhea, constipation, pain near the anus) TENDERNESS IN MOUTH AND THROAT WITH OR WITHOUT PRESENCE OF ULCERS (sore throat, sores in mouth, or a toothache) UNUSUAL RASH, SWELLING OR PAIN  UNUSUAL VAGINAL DISCHARGE OR ITCHING   Items with * indicate a potential emergency and should be followed up as soon as possible or go to the Emergency Department if any problems should occur.  Please show the CHEMOTHERAPY ALERT CARD or IMMUNOTHERAPY ALERT CARD at check-in to  the Emergency Department and triage nurse.  Should you have questions after your visit or need to cancel or reschedule your appointment, please contact [facility] MEDICAL ONCOLOG[facility]  Dept: [phone]  and follow the prompts.  Office hours are 8:00 a.m. to 4:30 p.m. Monday - Friday. Please note that voicemails left after 4:00 p.m. may not be returned until the following business day.  We are closed weekends and major holidays. You have access to a nurse at all times for urgent questions. Please call the main number to the clinic Dept: [phone] and follow the prompts.   For any non-urgent questions, you may also contact your provider using MyChart. We now offer e-Visits for anyone 18 and older to request care online for non-urgent symptoms. For details visit [email or web address].   Also download the MyChart app! Go to the app store, search "MyChart", open the app, select [facility], and log in with your MyChart username and password.  Masks are optional in the cancer centers. If you would like for your care team to wear a mask while they are taking care of you, please let them know. You may have one support Connie [name] who is at least 66 years old accompany you for your appointments. 

## 2022-04-20 NOTE — Progress Notes (Signed)
Pt is approved for the $1000 [place].

## 2022-04-20 NOTE — Progress Notes (Signed)
Per Regan Rakers, NP, okay to treat with AST 98

## 2022-04-21 ENCOUNTER — Other Ambulatory Visit: Payer: Self-pay

## 2022-04-21 ENCOUNTER — Telehealth: Payer: Self-pay | Admitting: Internal Medicine

## 2022-04-21 DIAGNOSIS — K7031 Alcoholic cirrhosis of liver with ascites: Secondary | ICD-10-CM

## 2022-04-21 DIAGNOSIS — C799 Secondary malignant neoplasm of unspecified site: Secondary | ICD-10-CM

## 2022-04-21 LAB — AFP TUMOR MARKER: AFP, Serum, Tumor Marker: 367 ng/mL — ABNORMAL HIGH (ref 0.0–9.2)

## 2022-04-21 LAB — CANCER ANTIGEN 19-9: CA 19-9: 959 U/mL — ABNORMAL HIGH (ref 0–35)

## 2022-04-21 NOTE — Telephone Encounter (Signed)
Pt was made aware of Dr. Carlean Purl recommendations: Pt was scheduled for an EGD on 04/28/2022 at 9:30 AM with Dr. Carlean Purl:. Pt to arrive at 8:30 AM: Pt made aware Prep instructions were sent to pt via my chart: Pt made aware: Ambulatory Referral to GI placed in Epic: Pt made aware Pt verbalized understanding with all questions answered.

## 2022-04-21 NOTE — Telephone Encounter (Signed)
Patient with liver tumor, cirrhosis  Suspect it is hepatocellular carcinoma but stains suggest it could be stomach cancer  Needs EGD in [place] - please try for next week  I have reviewed chart and did not see any contraindications to [facility]  Dx   Encounter Diagnoses  Name Primary?   Alcoholic cirrhosis of liver with ascites ([place]) Yes   Metastatic malignant neoplasm, unspecified site ([place])

## 2022-04-23 ENCOUNTER — Encounter: Payer: Self-pay | Admitting: Hematology

## 2022-04-23 DIAGNOSIS — C221 Intrahepatic bile duct carcinoma: Secondary | ICD-10-CM | POA: Insufficient documentation

## 2022-04-23 MED ORDER — ONDANSETRON HCL 8 MG PO TABS: 8.0000 mg | ORAL_TABLET | Freq: Three times a day (TID) | ORAL | 1 refills | Status: DC | PRN

## 2022-04-23 MED ORDER — PROCHLORPERAZINE MALEATE 10 MG PO TABS: 10.0000 mg | ORAL_TABLET | Freq: Four times a day (QID) | ORAL | 1 refills | Status: DC | PRN

## 2022-04-23 NOTE — Progress Notes (Signed)
DISCONTINUE OFF PATHWAY REGIMEN - Other   [id number]:Durvalumab 1,500 mg IV D1 + Tremelimumab 300 mg IV D1 q28 Days x 1 Cycle Followed by Durvalumab 1,500 mg IV D1 q28 Days:   Cycle 1: A cycle is 28 days:     Tremelimumab-actl      Durvalumab    Cycles 2 and beyond: A cycle is every 28 days:     Durvalumab   **Always confirm dose/schedule in your pharmacy ordering system**  REASON: Other Reason PRIOR TREATMENT: Durvalumab 1,500 mg IV D1 + Tremelimumab 300 mg IV D1 q28 Days x 1 Cycle Followed by Durvalumab 1,500 mg IV D1 q28 Days TREATMENT RESPONSE: Unable to Evaluate  START OFF PATHWAY REGIMEN - Other   [id number]:Cisplatin IV D1,8 + Durvalumab 1,500 mg IV D1 + Gemcitabine IV D1,8 q21 Days for up to 8 Cycles Followed by Durvalumab 1,500 mg IV D1 q28 Days:   Cycles 1 through up to 8: A cycle is every 21 days:     Durvalumab      Gemcitabine      Cisplatin    Cycles 9 and beyond: A cycle is every 28 days:     Durvalumab   **Always confirm dose/schedule in your pharmacy ordering system**  Patient Characteristics: Intent of Therapy: Non-Curative / Palliative Intent, Discussed with Patient

## 2022-04-26 ENCOUNTER — Inpatient Hospital Stay: Payer: 59

## 2022-04-26 ENCOUNTER — Other Ambulatory Visit: Payer: Self-pay

## 2022-04-26 ENCOUNTER — Telehealth: Payer: Self-pay | Admitting: Hematology

## 2022-04-26 DIAGNOSIS — C220 Liver cell carcinoma: Secondary | ICD-10-CM

## 2022-04-26 DIAGNOSIS — C221 Intrahepatic bile duct carcinoma: Secondary | ICD-10-CM | POA: Diagnosis not present

## 2022-04-26 MED ORDER — SODIUM CHLORIDE 0.9 % IV SOLN
90.0000 mg | Freq: Once | INTRAVENOUS | Status: AC
  Administered 2022-04-26: 90 mg via INTRAVENOUS
  Filled 2022-04-26: qty 10

## 2022-04-26 MED ORDER — SODIUM CHLORIDE 0.9 % IV SOLN: Freq: Once | INTRAVENOUS | Status: AC

## 2022-04-26 NOTE — Telephone Encounter (Signed)
Left patient a voicemail regarding upcoming appointments  

## 2022-04-26 NOTE — Patient Instructions (Signed)
Pamidronate Injection What is this medication? PAMIDRONATE (pa mi DROE nate) treats high calcium levels in the blood caused by cancer. It may also be used with chemotherapy to treat weakened bones caused by cancer. It can also be used to treat Paget's disease of the bone. It works by slowing down the release of calcium from bones. This lowers calcium levels in your blood. It also makes your bones stronger and less likely to break (fracture). It belongs to a group of medications called bisphosphonates. This medicine may be used for other purposes; ask your health care provider or pharmacist if you have questions. COMMON BRAND NAME(S): Aredia What should I tell my care team before I take this medication? They need to know if you have any of these conditions: Bleeding disorder Cancer Dental disease Kidney disease Low levels of calcium or other minerals in the blood Low red blood cell counts Receiving steroids, such as dexamethasone or prednisone An unusual or allergic reaction to pamidronate, other medications, foods, dyes or preservatives Pregnant or trying to get pregnant Breast-feeding How should I use this medication? This medication is injected into a vein. It is given by your care team in a hospital or clinic setting. Talk to your care team about the use of this medication in children. Special care may be needed. Overdosage: If you think you have taken too much of this medicine contact a poison control center or emergency room at once. NOTE: This medicine is only for you. Do not share this medicine with others. What if I miss a dose? Keep appointments for follow-up doses. It is important not to miss your dose. Call your care team if you are unable to keep an appointment. What may interact with this medication? Certain antibiotics given by injection Medications for inflammation or pain, such as ibuprofen, naproxen Some diuretics, such as bumetanide, furosemide Cyclosporine Parathyroid  hormone Tacrolimus Teriparatide Thalidomide This list may not describe all possible interactions. Give your health care provider a list of all the medicines, herbs, non-prescription drugs, or dietary supplements you use. Also tell them if you smoke, drink alcohol, or use illegal drugs. Some items may interact with your medicine. What should I watch for while using this medication? Visit your care team for regular checks on your progress. It may be some time before you see the benefit from this medication. Some people who take this medication have severe bone, joint, or muscle pain. This medication may also increase your risk for jaw problems or a broken thigh bone. Tell your care team right away if you have severe pain in your jaw, bones, joints, or muscles. Tell your care team if you have any pain that does not go away or that gets worse. Tell your dentist and dental surgeon that you are taking this medication. You should not have major dental surgery while on this medication. See your dentist to have a dental exam and fix any dental problems before starting this medication. Take good care of your teeth while on this medication. Make sure you see your dentist for regular follow-up appointments. You should make sure you get enough calcium and vitamin D while you are taking this medication. Discuss the foods you eat and the vitamins you take with your care team. You may need bloodwork while you are taking this medication. Talk to your care team if you wish to become pregnant or think you might be pregnant. This medication can cause serious birth defects. What side effects may I notice from receiving   this medication? Side effects that you should report to your care team as soon as possible: Allergic reactions--skin rash, itching, hives, swelling of the face, lips, tongue, or throat Kidney injury--decrease in the amount of urine, swelling of the ankles, hands, or feet Low calcium level--muscle pain or  cramps, confusion, tingling, or numbness in the hands or feet Osteonecrosis of the jaw--pain, swelling, or redness in the mouth, numbness of the jaw, poor healing after dental work, unusual discharge from the mouth, visible bones in the mouth Severe bone, joint, or muscle pain Side effects that usually do not require medical attention (report to your care team if they continue or are bothersome): Constipation Fatigue Fever Loss of appetite Nausea Pain, redness, or irritation at injection site Stomach pain This list may not describe all possible side effects. Call your doctor for medical advice about side effects. You may report side effects to FDA at 1-800-FDA-1088. Where should I keep my medication? This medication is given in a hospital or clinic. It will not be stored at home. NOTE: This sheet is a summary. It may not cover all possible information. If you have questions about this medicine, talk to your doctor, pharmacist, or health care provider.  2023 Elsevier/Gold Standard (2021-06-02 00:00:00)  

## 2022-04-28 ENCOUNTER — Ambulatory Visit (AMBULATORY_SURGERY_CENTER): Payer: 59 | Admitting: Internal Medicine

## 2022-04-28 ENCOUNTER — Other Ambulatory Visit: Payer: Self-pay

## 2022-04-28 ENCOUNTER — Encounter: Payer: Self-pay | Admitting: Internal Medicine

## 2022-04-28 ENCOUNTER — Other Ambulatory Visit: Payer: Self-pay | Admitting: *Deleted

## 2022-04-28 VITALS — BP 161/94 | HR 97 | Temp 97.5°F | Resp 17 | Ht 65.0 in | Wt 228.4 lb

## 2022-04-28 DIAGNOSIS — C799 Secondary malignant neoplasm of unspecified site: Secondary | ICD-10-CM

## 2022-04-28 DIAGNOSIS — K7031 Alcoholic cirrhosis of liver with ascites: Secondary | ICD-10-CM

## 2022-04-28 DIAGNOSIS — K2950 Unspecified chronic gastritis without bleeding: Secondary | ICD-10-CM

## 2022-04-28 MED ORDER — SODIUM CHLORIDE 0.9 % IV SOLN: 500.0000 mL | Freq: Once | INTRAVENOUS | Status: DC

## 2022-04-28 NOTE — Progress Notes (Signed)
[facility] History and Physical   Primary Care Physician:  Arthur Holms, NP   Reason for Procedure:   Origin of adenocarcinoma?  Plan:    EGD     HPI: Connie [name] is a 65 y.o. 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 For EGD to assess.   Past Medical History:  Diagnosis Date   Alcohol abuse    Alcoholic cirrhosis of liver (HCC)    Atrial fibrillation with RVR ([place]) 08/02/2019   Cancer (HCC)    Cataract    Chronic kidney disease    Helicobacter pylori gastritis 08/07/2019   Hepatitis C antibody positive in blood - negative RNA    Hypertension    Pancreatic pseudocyst 03/04/2021    Past Surgical History:  Procedure Laterality Date   BIOPSY  08/03/2019   Procedure: BIOPSY;  Surgeon: Gatha Mayer, MD;  Location: [facility] ENDOSCOPY;  Service: Endoscopy;;   ESOPHAGOGASTRODUODENOSCOPY (EGD) WITH PROPOFOL N/A 08/03/2019   Procedure: ESOPHAGOGASTRODUODENOSCOPY (EGD) WITH PROPOFOL;  Surgeon: Gatha Mayer, MD;  Location: [facility] ENDOSCOPY;  Service: Endoscopy;  Laterality: N/A;   IR RADIOLOGIST EVAL & MGMT  02/14/2022   IR RADIOLOGIST EVAL & MGMT  03/14/2022    Prior to Admission medications   Medication Sig Start Date End Date Taking? Authorizing Provider  acetaminophen (TYLENOL) 500 MG tablet Take 1,000 mg by mouth every 6 (six) hours as needed for mild pain.   Yes [provider]  amLODipine (NORVASC) 5 MG tablet Take 1 tablet by mouth daily. 08/25/21  Yes [provider]  latanoprost (XALATAN) 0.005 % ophthalmic solution Place 1 drop into both eyes at bedtime.   Yes [provider]  lipase/protease/amylase (CREON) 12000-38000 units CPEP capsule Take 1 capsule (12,000 Units total) by mouth 3 (three) times daily with meals. 03/06/21  Yes Lavina Hamman, MD  losartan (COZAAR) 50 MG tablet Take 50 mg by mouth every morning. 03/14/22  Yes [provider]  polyethylene glycol (MIRALAX / GLYCOLAX) 17 g packet Take 17 g by mouth  2 (two) times daily. 03/06/21  Yes Lavina Hamman, MD  co-enzyme Q-10 30 MG capsule Take 30 mg by mouth 3 (three) times a week.    [provider]  diclofenac Sodium (VOLTAREN) 1 % GEL Apply 2 g topically 2 (two) times daily as needed (pain). Patient not taking: Reported on 04/28/2022    [provider]  gabapentin (NEURONTIN) 100 MG capsule Take 1 capsule (100 mg total) by mouth 3 (three) times daily. 03/06/21 04/05/21  Lavina Hamman, MD  metoprolol succinate (TOPROL-XL) 25 MG 24 hr tablet Take 1 tablet (25 mg total) by mouth daily. Patient not taking: Reported on 04/20/2022 08/04/19   Earlene Plater, MD  nicotine (NICODERM CQ - DOSED IN MG/24 HOURS) 14 mg/24hr patch Place 1 patch (14 mg total) onto the skin daily as needed (smoking cessation). 03/06/21   Lavina Hamman, MD  Omega-3 Fatty Acids (FISH OIL) 1000 MG CAPS Take 1,000 mg by mouth daily.    [provider]  ondansetron (ZOFRAN) 8 MG tablet Take 1 tablet (8 mg total) by mouth every 8 (eight) hours as needed for nausea or vomiting. 03/21/22   Truitt Merle, MD  pantoprazole (PROTONIX) 40 MG tablet Take 1 tablet (40 mg total) by mouth 2 (two) times daily. Patient taking differently: Take 40 mg by mouth daily. 08/04/19 02/21/21  Earlene Plater, MD  prochlorperazine (COMPAZINE) 10 MG tablet Take 1 tablet (10 mg total) by mouth every 6 (six)  hours as needed for nausea or vomiting. 03/21/22   Truitt Merle, MD    Current Outpatient Medications  Medication Sig Dispense Refill   acetaminophen (TYLENOL) 500 MG tablet Take 1,000 mg by mouth every 6 (six) hours as needed for mild pain.     amLODipine (NORVASC) 5 MG tablet Take 1 tablet by mouth daily.     latanoprost (XALATAN) 0.005 % ophthalmic solution Place 1 drop into both eyes at bedtime.     lipase/protease/amylase (CREON) 12000-38000 units CPEP capsule Take 1 capsule (12,000 Units total) by mouth 3 (three) times daily with meals. 270 capsule 0   losartan (COZAAR) 50 MG  tablet Take 50 mg by mouth every morning.     polyethylene glycol (MIRALAX / GLYCOLAX) 17 g packet Take 17 g by mouth 2 (two) times daily. 14 each 0   co-enzyme Q-10 30 MG capsule Take 30 mg by mouth 3 (three) times a week.     diclofenac Sodium (VOLTAREN) 1 % GEL Apply 2 g topically 2 (two) times daily as needed (pain). (Patient not taking: Reported on 04/28/2022)     gabapentin (NEURONTIN) 100 MG capsule Take 1 capsule (100 mg total) by mouth 3 (three) times daily. 90 capsule 0   metoprolol succinate (TOPROL-XL) 25 MG 24 hr tablet Take 1 tablet (25 mg total) by mouth daily. (Patient not taking: Reported on 04/20/2022) 30 tablet 1   nicotine (NICODERM CQ - DOSED IN MG/24 HOURS) 14 mg/24hr patch Place 1 patch (14 mg total) onto the skin daily as needed (smoking cessation). 28 patch 0   Omega-3 Fatty Acids (FISH OIL) 1000 MG CAPS Take 1,000 mg by mouth daily.     ondansetron (ZOFRAN) 8 MG tablet Take 1 tablet (8 mg total) by mouth every 8 (eight) hours as needed for nausea or vomiting. 20 tablet 0   pantoprazole (PROTONIX) 40 MG tablet Take 1 tablet (40 mg total) by mouth 2 (two) times daily. (Patient taking differently: Take 40 mg by mouth daily.) 60 tablet 1   prochlorperazine (COMPAZINE) 10 MG tablet Take 1 tablet (10 mg total) by mouth every 6 (six) hours as needed for nausea or vomiting. 30 tablet 0   Current Facility-Administered Medications  Medication Dose Route Frequency Provider Last Rate Last Admin   0.9 %  sodium chloride infusion  500 mL Intravenous Once Gatha Mayer, MD        Allergies as of 04/28/2022   (No Known Allergies)    Family History  Problem Relation Age of Onset   Lung cancer Mother    Colon cancer Neg Hx    Esophageal cancer Neg Hx    Rectal cancer Neg Hx    Stomach cancer Neg Hx     Social History   Socioeconomic History   Marital status: Married    Spouse name: Not on file   Number of children: 3   Years of education: Not on file   Highest education  level: Not on file  Occupational History   Not on file  Tobacco Use   Smoking status: Every Day    Packs/day: 0.50    Years: 50.00    Total pack years: 25.00    Types: Cigarettes   Smokeless tobacco: Not on file  Substance and Sexual Activity   Alcohol use: Not Currently    Comment: she used to drink alcohol heavily, stopped in 01/2021   Drug use: Never   Sexual activity: Not on file  Other Topics Concern  Not on file  Social History Narrative   ** Merged History Encounter ** Married   Use to live in [place], moved to [place] in 09/2018   Daughter in [place]   Works [employer]   [place], Smoker   Social Determinants of Health   Financial Resource Strain: Not on [employer] Insecurity: Not on file  Transportation Needs: Not on file  Physical Activity: Not on file  Stress: Not on file  Social Connections: Not on file  Intimate Partner Violence: Not on file    Review of Systems:  All other review of systems negative except as mentioned in the HPI.  Physical Exam: Vital signs BP (!) 144/74   Pulse 88   Temp (!) 97.5 F (36.4 C) (Temporal)   Ht '5\' 5"'$  (1.651 m)   Wt 228 lb 6.4 oz (103.6 kg)   SpO2 96%   BMI 38.01 kg/m   General:   Alert,  Well-developed, well-nourished, pleasant and cooperative in NAD Lungs:  Clear throughout to auscultation.   Heart:  Regular rate and rhythm; no murmurs, clicks, rubs,  or gallops. Abdomen:  Soft, nontender and nondistended. Normal bowel sounds.   Neuro/Psych:  Alert and cooperative. Normal mood and affect. A and O x 3   '@Connie [name]'$  Simonne Maffucci, MD, [facility] [phone] (pager) 04/28/2022 9:04 AM@

## 2022-04-28 NOTE — Progress Notes (Signed)
Called to room to assist during endoscopic procedure.  Patient ID and intended procedure confirmed with present staff. Received instructions for my participation in the procedure from the performing physician.  

## 2022-04-28 NOTE — Progress Notes (Signed)
A and O x3. Report to RN. Tolerated MAC anesthesia well.Teeth unchanged after procedure. 

## 2022-04-28 NOTE — Op Note (Signed)
[facility] Patient Name: Connie [name] Procedure Date: 04/28/2022 9:04 AM MRN: [id number] Endoscopist: Gatha Mayer , MD, [id number] Age: 66 Referring MD:  Date of Birth: [birth year] Gender: Female Account #: [id number] Procedure:                Upper GI endoscopy Indications:              Exclusion of gastric tumor Medicines:                Monitored Anesthesia Care Procedure:                Pre-Anesthesia Assessment:                           - Prior to the procedure, a History and Physical                            was performed, and patient medications and                            allergies were reviewed. The patient's tolerance of                            previous anesthesia was also reviewed. The risks                            and benefits of the procedure and the sedation                            options and risks were discussed with the patient.                            All questions were answered, and informed consent                            was obtained. Prior Anticoagulants: The patient has                            taken no anticoagulant or antiplatelet agents. ASA                            Grade Assessment: III - A patient with severe                            systemic disease. After reviewing the risks and                            benefits, the patient was deemed in satisfactory                            condition to undergo the procedure.                           After obtaining informed consent, the endoscope was  passed under direct vision. Throughout the                            procedure, the patient's blood pressure, pulse, and                            oxygen saturations were monitored continuously. The                            GIF [id number] #[id number] was introduced through the                            mouth, and advanced to the second part of duodenum.                            The upper GI  endoscopy was accomplished without                            difficulty. The patient tolerated the procedure                            well. Scope In: Scope Out: Findings:                 Diffuse severe inflammation characterized by                            erythema and pale discolration was found in the                            gastric antrum. Biopsies were taken with a cold                            forceps for histology. Verification of patient                            identification for the specimen was done. Estimated                            blood loss was minimal.                           The exam was otherwise without abnormality except                            for irregular z-line.                           The cardia and gastric fundus were normal on                            retroflexion. Complications:            No immediate complications. Estimated Blood Loss:     Estimated blood loss was minimal. Impression:               -  Mucosal changes suspicious for gastritis.                            Biopsied given situation - doubt malignancy but                            liver biopsies raised ? UGI malignancy                           - The examination was otherwise normal except for                            irregular z-line. No varices Recommendation:           - Patient has a contact number available for                            emergencies. The signs and symptoms of potential                            delayed complications were discussed with the                            patient. Return to normal activities tomorrow.                            Written discharge instructions were provided to the                            patient.                           - Resume previous diet.                           - Continue present medications.                           - Await pathology results. Consider repeat routine                            EGD 1-2 years  given cirrhosis Gatha Mayer, MD 04/28/2022 9:24:30 AM This report has been signed electronically.

## 2022-04-28 NOTE — Patient Instructions (Addendum)
The stomach lining looks inflamed - we call that gastritis. I did not see anything that looked like cancer but because of your situation, I took biopsies.  Dr. Burr Medico will follow-up on this.  I appreciate the opportunity to care for you. Gatha Mayer, MD, [facility]   Handouts Provided:  Gastritis  YOU HAD AN ENDOSCOPIC PROCEDURE TODAY AT [place]:   Refer to the procedure report that was given to you for any specific questions about what was found during the examination.  If the procedure report does not answer your questions, please call your gastroenterologist to clarify.  If you requested that your care partner not be given the details of your procedure findings, then the procedure report has been included in a sealed envelope for you to review at your convenience later.  YOU SHOULD EXPECT: Some feelings of bloating in the abdomen. Passage of more gas than usual.  Walking can help get rid of the air that was put into your GI tract during the procedure and reduce the bloating. If you had a lower endoscopy (such as a colonoscopy or flexible sigmoidoscopy) you may notice spotting of blood in your stool or on the toilet paper. If you underwent a bowel prep for your procedure, you may not have a normal bowel movement for a few days.  Please Note:  You might notice some irritation and congestion in your nose or some drainage.  This is from the oxygen used during your procedure.  There is no need for concern and it should clear up in a day or so.  SYMPTOMS TO REPORT IMMEDIATELY:  Following upper endoscopy (EGD)  Vomiting of blood or coffee ground material  New chest pain or pain under the shoulder blades  Painful or persistently difficult swallowing  New shortness of breath  Fever of 100F or higher  Black, tarry-looking stools  For urgent or emergent issues, a gastroenterologist can be reached at any hour by calling [phone]. Do not use MyChart messaging for urgent  concerns.    DIET:  We do recommend a small meal at first, but then you may proceed to your regular diet.  Drink plenty of fluids but you should avoid alcoholic beverages for 24 hours.  ACTIVITY:  You should plan to take it easy for the rest of today and you should NOT DRIVE or use heavy machinery until tomorrow (because of the sedation medicines used during the test).    FOLLOW UP: Our staff will call the number listed on your records the next business day following your procedure.  We will call around 7:15- 8:00 am to check on you and address any questions or concerns that you may have regarding the information given to you following your procedure. If we do not reach you, we will leave a message.     If any biopsies were taken you will be contacted by phone or by letter within the next 1-3 weeks.  Please call us at [phone] if you have not heard about the biopsies in 3 weeks.    SIGNATURES/CONFIDENTIALITY: You and/or your care partner have signed paperwork which will be entered into your electronic medical record.  These signatures attest to the fact that that the information above on your After Visit Summary has been reviewed and is understood.  Full responsibility of the confidentiality of this discharge information lies with you and/or your care-partner.

## 2022-05-02 ENCOUNTER — Other Ambulatory Visit: Payer: Self-pay

## 2022-05-03 ENCOUNTER — Encounter: Payer: Self-pay | Admitting: General Practice

## 2022-05-03 ENCOUNTER — Telehealth: Payer: Self-pay | Admitting: *Deleted

## 2022-05-03 NOTE — Progress Notes (Signed)
[facility] Spiritual Care Note  Made follow-up call to Ms Connie [name], who was very grateful. She reports deep thankfulness for an encouraging report post-endoscopy and looks forward to a Spiritual Care visit (with hugs!) at her next treatment on 1/11, noting no other concerns at this time.   [place], [place], [facility] Pager [phone] Voicemail [phone]

## 2022-05-03 NOTE — Telephone Encounter (Signed)
Left message on f/u call 

## 2022-05-10 ENCOUNTER — Other Ambulatory Visit: Payer: Self-pay | Admitting: Hematology

## 2022-05-10 NOTE — Progress Notes (Signed)
Pharmacist Chemotherapy Monitoring - Initial Assessment    Anticipated start date: 05/17/22   The following has been reviewed per standard work regarding the patient's treatment regimen: The patient's diagnosis, treatment plan and drug doses, and organ/hematologic function Lab orders and baseline tests specific to treatment regimen  The treatment plan start date, drug sequencing, and pre-medications Prior authorization status  Patient's documented medication list, including drug-drug interaction screen and prescriptions for anti-emetics and supportive care specific to the treatment regimen The drug concentrations, fluid compatibility, administration routes, and timing of the medications to be used The patient's access for treatment and lifetime cumulative dose history, if applicable  The patient's medication allergies and previous infusion related reactions, if applicable   Changes made to treatment plan:  N/A  Follow up needed:  N/A   Larene Beach, RPH, 05/10/2022  2:48 PM

## 2022-05-10 NOTE — Progress Notes (Unsigned)
[place]   [phone]) [phone] [phone]   Clinic Follow up Note   Patient Care Team: Arthur Holms, NP as PCP - General (Nurse Practitioner) Sanda Klein, MD as PCP - Cardiology (Cardiology) Roosevelt Locks, [place] as Nurse Practitioner (Transplant Hepatology) Sandi Mariscal, MD as Consulting Physician (Interventional Radiology) Truitt Merle, MD as Consulting Physician (Hematology and Oncology)  Date of Service:  05/11/2022  CHIEF COMPLAINT: f/u of [place]   CURRENT THERAPY:  Durvaluamb ,Gemox  ASSESSMENT:  Connie [name] is a 67 y.o. 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  -Although she had typical image findings of [place], she underwent liver biopsy since CA 19-9 was also elevated  -liver biopsy showed well to moderately differentiated CK7 positive adenocarcinoma and her EGD was negative for upper GI tract malignancy. Tumor cells were also positive for Glycipan 3, supporting mixed HCC and cholagiocarcinoma. -I recommended change her treatment to GEMOX (cisplatin is contraindicated due to her chronic kidney disease) and continue durvalumab, she will start today. -Patient had many questions about the reasoning of chemotherapy, her diagnosis, prognosis, it etc.  I explained the above to her and her significant other in detail again today.  After lengthy discussion, she agrees to proceed with chemo and durvalumab.  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  Underlying hepatic cirrhosis suspected.   2.5 cm indeterminate central hypoechoic hepatic lesion. See above comment.   02/22/2021 Imaging   CLINICAL DATA:  Right-sided abdominal pain.   EXAM: ABDOMEN ULTRASOUND COMPLETE  IMPRESSION: 1. Cirrhosis and small ascites. 2. Gallbladder sludge. No sonographic evidence of acute cholecystitis.   02/22/2021 Imaging   CLINICAL DATA:  Abdominal pain.  Concern for diverticulitis.   EXAM: CT ABDOMEN AND PELVIS WITH CONTRAST  IMPRESSION: 1. Acute pancreatitis. No abscess or pseudocyst. 2. Cirrhosis with small ascites. 3. Colonic diverticulosis. No bowel obstruction. Normal appendix. 4. Aortic Atherosclerosis (ICD10-I70.0).   03/02/2021 Tumor Marker   CA 19-9 = 45 (^) AFP = 11 (^)   03/03/2021 Imaging   CLINICAL DATA:  Pancreatitis, worsening abdominal pain.   EXAM: CT ABDOMEN AND PELVIS WITH CONTRAST  IMPRESSION: 1. Multiple suspicious lesions in the right  hepatic lobe. Patient has underlying cirrhosis and findings are concerning for multifocal hepatocellular carcinoma. Metastatic disease is also in the differential diagnosis.  Recommend MRI of the abdomen with and without contrast. If the patient cannot tolerate a MRI, consider a multiphase liver CT. 2. Increased inflammatory changes in the upper abdomen compatible with pancreatitis and a large developing pseudocyst. This is developing pseudocyst measures up to 14 cm in size. 3. Cirrhosis with evidence of portal hypertension demonstrated by esophageal varices and varices draining into the IVC. 4. Indeterminate 3 mm pulmonary nodule in the lingula. Management of this pulmonary nodule depends on the etiology of the liver lesions.   03/04/2021 Imaging   EXAM: MRI ABDOMEN WITHOUT AND WITH CONTRAST (INCLUDING MRCP)  IMPRESSION: 1. Multiple enhancing lesions are noted throughout the right lobe of the liver, as detailed above. These have indeterminate imaging characteristics. Given the background of hepatic cirrhosis, the possibility of multifocal infiltrative hepatocellular carcinoma should be considered, although none of these lesions meet definite imaging criteria to establish that diagnosis. Correlation with AFP level is recommended. Other differential considerations include malignant and benign etiologies such as fibrolamellar hepatocellular carcinoma and multifocal focal nodular hyperplasia ([place]). Consideration for repeat abdominal MRI with and without IV gadolinium (Eovist) is also suggested, although biopsy may ultimately be required to establish a tissue diagnosis. 2. 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 3. Trace volume of perihepatic ascites.   09/22/2021 Tumor Marker   CA 19-9 = 190 (^) AFP = 124 (^)   01/19/2022 Tumor Marker   CA 19-9 = 816 (^) AFP = 307 (^)   02/06/2022 Imaging   EXAM: MRI ABDOMEN WITHOUT AND WITH CONTRAST (INCLUDING MRCP)  IMPRESSION: 1. Morphologic features of the liver compatible with cirrhosis. 2. Extensive enhancing liver lesions are identified throughout  both lobes of liver which demonstrate marked progression compared with the previous exam. Enhancing lesions demonstrate washout on the delayed images. In the setting of cirrhosis with progressively elevated alpha fetoprotein levels imaging findings are highly worrisome for multifocal hepatocellular carcinoma. 3. Enlarged upper abdominal lymph nodes identified. Cannot exclude underlying nodal metastasis. 4. There are several lesions within the central mesentery in the distribution of previous large pseudo cysts. The largest measures 5.0 x 3.1 cm. This is favored to represent sequelae of prior hemorrhagic pancreatitis with pseudocyst formation.   02/23/2022 Initial Diagnosis   Hepatocellular carcinoma ([place])   03/20/2022 Cancer Staging   Staging form: Liver, AJCC 8th Edition - Clinical stage from 03/20/2022: Stage IVA (cT3, cN1, cM0) - Signed by Truitt Merle, MD on 03/20/2022   03/21/2022 - 04/20/2022 Chemotherapy   Patient is on Treatment Plan : LUNG NSCLC Durvalumab (1500) q28d     04/04/2022 Pathology Results   A. LIVER, RIGHT LOBE, BIOPSY: Well to moderately differentiated CK7 positive adenocarcinoma (see comment)  COMMENT:  Given the cytohistomorphology and this immunohistochemical pattern of cytokeratin 7 positivity, the differential diagnosis would include a pancreaticobiliary or upper gastrointestinal intestinal tract primary or possibly a lung primary (not favored).    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     All other systems were reviewed with the patient and are negative.  MEDICAL HISTORY:  Past Medical History:  Diagnosis Date   Alcohol abuse    Alcoholic cirrhosis of liver (HCC)    Atrial fibrillation with RVR ([place]) 08/02/2019   Cancer (HCC)    Cataract    Chronic kidney disease    Helicobacter pylori gastritis 08/07/2019   Hepatitis C antibody positive in blood - negative RNA    Hypertension    Pancreatic pseudocyst 03/04/2021    SURGICAL HISTORY: Past Surgical History:  Procedure Laterality Date   BIOPSY  08/03/2019   Procedure: BIOPSY;  Surgeon: Gatha Mayer, MD;  Location: [facility] ENDOSCOPY;  Service: Endoscopy;;   ESOPHAGOGASTRODUODENOSCOPY (EGD) WITH PROPOFOL N/A 08/03/2019   Procedure: ESOPHAGOGASTRODUODENOSCOPY (EGD) WITH PROPOFOL;  Surgeon: Gatha Mayer, MD;  Location: [facility] ENDOSCOPY;  Service: Endoscopy;  Laterality: N/A;   IR RADIOLOGIST EVAL & MGMT  02/14/2022   IR RADIOLOGIST EVAL & MGMT  03/14/2022    I have reviewed the social history and family history with the patient and they are unchanged from previous note.  ALLERGIES:  has No Known Allergies.  MEDICATIONS:  Current Outpatient Medications  Medication Sig Dispense Refill   acetaminophen (TYLENOL) 500 MG tablet Take 1,000 mg by mouth every 6 (six) hours as needed for mild pain.     amLODipine (NORVASC) 5 MG tablet Take 1 tablet by mouth daily.     diclofenac Sodium (VOLTAREN) 1 % GEL Apply 2 g topically 2 (two) times daily as needed (pain). (Patient not taking: Reported on 04/28/2022)     gabapentin (NEURONTIN) 100 MG capsule Take 1 capsule (100 mg total) by mouth 3 (three) times daily. 90 capsule 0   latanoprost (XALATAN) 0.005 % ophthalmic solution Place 1 drop into both eyes at bedtime.     lipase/protease/amylase (CREON) 12000-38000 units CPEP capsule Take 1 capsule (12,000 Units total) by mouth 3 (three)  times daily with meals. 270 capsule 0   losartan (COZAAR) 50 MG tablet Take 50 mg by mouth every morning.     metoprolol succinate (TOPROL-XL) 25 MG 24 hr tablet Take 1 tablet (25 mg total) by mouth daily. (Patient not taking: Reported on 04/20/2022) 30 tablet 1   ondansetron (ZOFRAN) 8 MG tablet Take 1 tablet (8 mg total) by mouth every 8 (eight) hours as needed for nausea or vomiting. 20 tablet 0   pantoprazole (PROTONIX) 40 MG tablet Take 1 tablet (40 mg total) by mouth 2 (two) times daily. (Patient taking differently: Take 40 mg by mouth daily.) 60 tablet 1   polyethylene glycol (MIRALAX / GLYCOLAX) 17 g packet Take 17 g by mouth 2 (two) times daily. 14 each 0   prochlorperazine (COMPAZINE) 10 MG tablet Take 1 tablet (10 mg total) by mouth every 6 (six) hours as needed for nausea or vomiting. 30 tablet 0   No current facility-administered medications for this visit.   Facility-Administered Medications Ordered in Other Visits  Medication Dose Route Frequency Provider Last Rate Last Admin   oxaliplatin (ELOXATIN) 185 mg in dextrose 5 % 500 mL chemo infusion  85 mg/m2 (Treatment Plan Recorded) Intravenous Once Truitt Merle, MD 294 mL/hr at 05/11/22 1626 Infusion Verify at 05/11/22 1626  PHYSICAL EXAMINATION: ECOG PERFORMANCE STATUS: 1 - Symptomatic but completely ambulatory  There were no vitals filed for this visit. Wt Readings from Last 3 Encounters:  04/28/22 228 lb 6.4 oz (103.6 kg)  04/20/22 232 lb 6.4 oz (105.4 kg)  04/05/22 228 lb 3.2 oz (103.5 kg)     GENERAL:alert, no distress and comfortable SKIN: skin color normal, no rashes or significant lesions EYES: normal, Conjunctiva are pink and non-injected, sclera clear  NEURO: alert & oriented x 3 with fluent speech   LABORATORY DATA:  I have reviewed the data as listed    Latest Ref Rng & Units 05/11/2022   12:07 PM 04/20/2022    9:03 AM 04/05/2022    9:05 AM  CBC  WBC 4.0 - 10.5 K/uL 11.7  10.1  10.0   Hemoglobin 12.0 -  15.0 g/dL 14.3  15.7  15.4   Hematocrit 36.0 - 46.0 % 42.5  45.9  45.0   Platelets 150 - 400 K/uL 191  195  209         Latest Ref Rng & Units 05/11/2022   12:07 PM 04/20/2022    9:03 AM 04/05/2022    9:05 AM  CMP  Glucose 70 - 99 mg/dL 92  102  106   BUN 8 - 23 mg/dL '20  13  13   '$ Creatinine 0.44 - 1.00 mg/dL 1.92  1.48  1.33   Sodium 135 - 145 mmol/L 137  139  138   Potassium 3.5 - 5.1 mmol/L 4.3  4.1  4.1   Chloride 98 - 111 mmol/L 107  107  106   CO2 22 - 32 mmol/L '24  25  26   '$ Calcium 8.9 - 10.3 mg/dL 13.4  12.2  12.4   Total Protein 6.5 - 8.1 g/dL 8.2  8.5  8.4   Total Bilirubin 0.3 - 1.2 mg/dL 0.8  0.6  0.7   Alkaline Phos 38 - 126 U/L 148  171  168   AST 15 - 41 U/L 97  98  96   ALT 0 - 44 U/L 38  34  47       RADIOGRAPHIC STUDIES: I have personally reviewed the radiological images as listed and agreed with the findings in the report. No results found.    No orders of the defined types were placed in this encounter.  All questions were answered. The patient knows to call the clinic with any problems, questions or concerns. No barriers to learning was detected. The total time spent in the appointment was 30 minutes.     Truitt Merle, MD 05/11/2022   Felicity Coyer, CMA, am acting as scribe for Truitt Merle, MD.   I have reviewed the above documentation for accuracy and completeness, and I agree with the above.

## 2022-05-11 ENCOUNTER — Other Ambulatory Visit: Payer: Self-pay

## 2022-05-11 ENCOUNTER — Encounter: Payer: Self-pay | Admitting: Hematology

## 2022-05-11 ENCOUNTER — Encounter: Payer: Self-pay | Admitting: General Practice

## 2022-05-11 ENCOUNTER — Inpatient Hospital Stay: Payer: 59

## 2022-05-11 ENCOUNTER — Inpatient Hospital Stay: Payer: 59 | Attending: Hematology

## 2022-05-11 ENCOUNTER — Inpatient Hospital Stay (HOSPITAL_BASED_OUTPATIENT_CLINIC_OR_DEPARTMENT_OTHER): Payer: 59 | Admitting: Hematology

## 2022-05-11 ENCOUNTER — Inpatient Hospital Stay: Payer: 59 | Admitting: Hematology

## 2022-05-11 ENCOUNTER — Telehealth: Payer: Self-pay

## 2022-05-11 VITALS — BP 170/86 | HR 93 | Temp 97.8°F | Resp 18

## 2022-05-11 DIAGNOSIS — I8510 Secondary esophageal varices without bleeding: Secondary | ICD-10-CM | POA: Diagnosis not present

## 2022-05-11 DIAGNOSIS — C220 Liver cell carcinoma: Secondary | ICD-10-CM

## 2022-05-11 DIAGNOSIS — N183 Chronic kidney disease, stage 3 unspecified: Secondary | ICD-10-CM | POA: Insufficient documentation

## 2022-05-11 DIAGNOSIS — R7989 Other specified abnormal findings of blood chemistry: Secondary | ICD-10-CM | POA: Insufficient documentation

## 2022-05-11 DIAGNOSIS — N1831 Chronic kidney disease, stage 3a: Secondary | ICD-10-CM | POA: Diagnosis not present

## 2022-05-11 DIAGNOSIS — Z79899 Other long term (current) drug therapy: Secondary | ICD-10-CM | POA: Diagnosis not present

## 2022-05-11 DIAGNOSIS — R978 Other abnormal tumor markers: Secondary | ICD-10-CM | POA: Diagnosis not present

## 2022-05-11 DIAGNOSIS — I700 Atherosclerosis of aorta: Secondary | ICD-10-CM | POA: Diagnosis not present

## 2022-05-11 DIAGNOSIS — K7030 Alcoholic cirrhosis of liver without ascites: Secondary | ICD-10-CM | POA: Diagnosis not present

## 2022-05-11 DIAGNOSIS — K863 Pseudocyst of pancreas: Secondary | ICD-10-CM | POA: Diagnosis not present

## 2022-05-11 DIAGNOSIS — C221 Intrahepatic bile duct carcinoma: Secondary | ICD-10-CM | POA: Diagnosis present

## 2022-05-11 DIAGNOSIS — K859 Acute pancreatitis without necrosis or infection, unspecified: Secondary | ICD-10-CM | POA: Insufficient documentation

## 2022-05-11 DIAGNOSIS — I4891 Unspecified atrial fibrillation: Secondary | ICD-10-CM | POA: Insufficient documentation

## 2022-05-11 DIAGNOSIS — I129 Hypertensive chronic kidney disease with stage 1 through stage 4 chronic kidney disease, or unspecified chronic kidney disease: Secondary | ICD-10-CM | POA: Diagnosis not present

## 2022-05-11 DIAGNOSIS — K766 Portal hypertension: Secondary | ICD-10-CM | POA: Diagnosis not present

## 2022-05-11 DIAGNOSIS — K7031 Alcoholic cirrhosis of liver with ascites: Secondary | ICD-10-CM | POA: Insufficient documentation

## 2022-05-11 DIAGNOSIS — Z5112 Encounter for antineoplastic immunotherapy: Secondary | ICD-10-CM | POA: Diagnosis not present

## 2022-05-11 DIAGNOSIS — K5730 Diverticulosis of large intestine without perforation or abscess without bleeding: Secondary | ICD-10-CM | POA: Diagnosis not present

## 2022-05-11 DIAGNOSIS — R772 Abnormality of alphafetoprotein: Secondary | ICD-10-CM | POA: Insufficient documentation

## 2022-05-11 LAB — CBC WITH DIFFERENTIAL (CANCER CENTER ONLY)
Abs Immature Granulocytes: 0.03 10*3/uL (ref 0.00–0.07)
Basophils Absolute: 0.1 10*3/uL (ref 0.0–0.1)
Basophils Relative: 1 %
Eosinophils Absolute: 0.5 10*3/uL (ref 0.0–0.5)
Eosinophils Relative: 4 %
HCT: 42.5 % (ref 36.0–46.0)
Hemoglobin: 14.3 g/dL (ref 12.0–15.0)
Immature Granulocytes: 0 %
Lymphocytes Relative: 28 %
Lymphs Abs: 3.3 10*3/uL (ref 0.7–4.0)
MCH: 32.8 pg (ref 26.0–34.0)
MCHC: 33.6 g/dL (ref 30.0–36.0)
MCV: 97.5 fL (ref 80.0–100.0)
Monocytes Absolute: 1.3 10*3/uL — ABNORMAL HIGH (ref 0.1–1.0)
Monocytes Relative: 11 %
Neutro Abs: 6.5 10*3/uL (ref 1.7–7.7)
Neutrophils Relative %: 56 %
Platelet Count: 191 10*3/uL (ref 150–400)
RBC: 4.36 MIL/uL (ref 3.87–5.11)
RDW: 13.8 % (ref 11.5–15.5)
WBC Count: 11.7 10*3/uL — ABNORMAL HIGH (ref 4.0–10.5)
nRBC: 0 % (ref 0.0–0.2)

## 2022-05-11 LAB — CMP (CANCER CENTER ONLY)
ALT: 38 U/L (ref 0–44)
AST: 97 U/L — ABNORMAL HIGH (ref 15–41)
Albumin: 3.8 g/dL (ref 3.5–5.0)
Alkaline Phosphatase: 148 U/L — ABNORMAL HIGH (ref 38–126)
Anion gap: 6 (ref 5–15)
BUN: 20 mg/dL (ref 8–23)
CO2: 24 mmol/L (ref 22–32)
Calcium: 13.4 mg/dL (ref 8.9–10.3)
Chloride: 107 mmol/L (ref 98–111)
Creatinine: 1.92 mg/dL — ABNORMAL HIGH (ref 0.44–1.00)
GFR, Estimated: 28 mL/min — ABNORMAL LOW (ref >60)
Glucose, Bld: 92 mg/dL (ref 70–99)
Potassium: 4.3 mmol/L (ref 3.5–5.1)
Sodium: 137 mmol/L (ref 135–145)
Total Bilirubin: 0.8 mg/dL (ref 0.3–1.2)
Total Protein: 8.2 g/dL — ABNORMAL HIGH (ref 6.5–8.1)

## 2022-05-11 LAB — TSH: TSH: 2.169 u[IU]/mL (ref 0.350–4.500)

## 2022-05-11 MED ORDER — SODIUM CHLORIDE 0.9 % IV SOLN
1500.0000 mg | Freq: Once | INTRAVENOUS | Status: AC
  Administered 2022-05-11: 1500 mg via INTRAVENOUS
  Filled 2022-05-11: qty 30

## 2022-05-11 MED ORDER — SODIUM CHLORIDE 0.9 % IV SOLN
16.0000 mg | Freq: Once | INTRAVENOUS | Status: AC
  Administered 2022-05-11: 16 mg via INTRAVENOUS
  Filled 2022-05-11: qty 8

## 2022-05-11 MED ORDER — SODIUM CHLORIDE 0.9 % IV SOLN
1000.0000 mg/m2 | Freq: Once | INTRAVENOUS | Status: AC
  Administered 2022-05-11: 2205 mg via INTRAVENOUS
  Filled 2022-05-11: qty 57.99

## 2022-05-11 MED ORDER — SODIUM CHLORIDE 0.9 % IV SOLN
10.0000 mg | Freq: Once | INTRAVENOUS | Status: AC
  Administered 2022-05-11: 10 mg via INTRAVENOUS
  Filled 2022-05-11: qty 10

## 2022-05-11 MED ORDER — OXALIPLATIN CHEMO INJECTION 100 MG/20ML
85.0000 mg/m2 | Freq: Once | INTRAVENOUS | Status: AC
  Administered 2022-05-11: 185 mg via INTRAVENOUS
  Filled 2022-05-11: qty 37

## 2022-05-11 MED ORDER — SODIUM CHLORIDE 0.9 % IV SOLN: Freq: Once | INTRAVENOUS | Status: AC

## 2022-05-11 MED ORDER — DEXTROSE 5 % IV SOLN: Freq: Once | INTRAVENOUS | Status: AC

## 2022-05-11 NOTE — Patient Instructions (Signed)
[facility] ONCOLOGY  Discharge Instructions: Thank you for choosing [facility] to provide your oncology and hematology care.   If you have a lab appointment with the [facility], please go directly to the [facility] and check in at the registration area.   Wear comfortable clothing and clothing appropriate for easy access to any Portacath or PICC line.   We strive to give you quality time with your provider. You may need to reschedule your appointment if you arrive late (15 or more minutes).  Arriving late affects you and other patients whose appointments are after yours.  Also, if you miss three or more appointments without notifying the office, you may be dismissed from the clinic at the provider's discretion.      For prescription refill requests, have your pharmacy contact our office and allow 72 hours for refills to be completed.    Today you received the following chemotherapy and/or immunotherapy agents: durvalumab, gemcitabine, oxaliplatin      To help prevent nausea and vomiting after your treatment, we encourage you to take your nausea medication as directed.  BELOW ARE SYMPTOMS THAT SHOULD BE REPORTED IMMEDIATELY: *FEVER GREATER THAN 100.4 F (38 C) OR HIGHER *CHILLS OR SWEATING *NAUSEA AND VOMITING THAT IS NOT CONTROLLED WITH YOUR NAUSEA MEDICATION *UNUSUAL SHORTNESS OF BREATH *UNUSUAL BRUISING OR BLEEDING *URINARY PROBLEMS (pain or burning when urinating, or frequent urination) *BOWEL PROBLEMS (unusual diarrhea, constipation, pain near the anus) TENDERNESS IN MOUTH AND THROAT WITH OR WITHOUT PRESENCE OF ULCERS (sore throat, sores in mouth, or a toothache) UNUSUAL RASH, SWELLING OR PAIN  UNUSUAL VAGINAL DISCHARGE OR ITCHING   Items with * indicate a potential emergency and should be followed up as soon as possible or go to the Emergency Department if any problems should occur.  Please show the CHEMOTHERAPY ALERT CARD or IMMUNOTHERAPY  ALERT CARD at check-in to the Emergency Department and triage nurse.  Should you have questions after your visit or need to cancel or reschedule your appointment, please contact [facility]  Dept: [phone]  and follow the prompts.  Office hours are 8:00 a.m. to 4:30 p.m. Monday - Friday. Please note that voicemails left after 4:00 p.m. may not be returned until the following business day.  We are closed weekends and major holidays. You have access to a nurse at all times for urgent questions. Please call the main number to the clinic Dept: [phone] and follow the prompts.   For any non-urgent questions, you may also contact your provider using MyChart. We now offer e-Visits for anyone 53 and older to request care online for non-urgent symptoms. For details visit [email or web address].   Also download the MyChart app! Go to the app store, search "MyChart", open the app, select [facility], and log in with your MyChart username and password.

## 2022-05-11 NOTE — Assessment & Plan Note (Addendum)
-  Stage IVA (cT3, cN1, cM0)  with diffuse liver mets, unresectable  -Liver cirrhosis and a 2.5cm mass was found in 07/2019 but she did not follow up -due to rising AFP, she had MRI on 02/06/2022 which showed extensive liver lesions (largest 13cm) throughout both lobes with marked progression from prior MRI; enlarged upper abdominal lymph nodes, no distant mets -she started Durvaluamb every 4 weeks, and Tremelimumab '300mg'$  once on day 1 of cycle 1, on 03/21/2022.  -Although she had typical image findings of [place], she underwent liver biopsy since CA 19-9 was also elevated  -liver biopsy showed well to moderately differentiated CK7 positive adenocarcinoma and her EGD was negative for upper GI tract malignancy. Tumor cells were also positive for Glycipan 3, supporting mixed HCC and cholagiocarcinoma. -I recommended change her treatment to GEMOX (cisplatin is contraindicated due to her chronic kidney disease) and continue durvalumab, she will start today. -Patient had many questions about the reasoning of chemotherapy, her diagnosis, prognosis, it etc.  I explained the above to her and her significant other in detail again today.  After lengthy discussion, she agrees to proceed with chemo and durvalumab.

## 2022-05-11 NOTE — Assessment & Plan Note (Signed)
-  will monitor renal function closely during chemo     

## 2022-05-11 NOTE — Progress Notes (Signed)
[facility] Spiritual Care Note  Followed up with Ms Connie [name] in infusion today as planned. She reports that she is adjusting to the bile-duct cancer diagnosis, receiving encouragement and support from her husband, finding joy in her grandchildren, and resolving to live as fully as she can. Her sense of humor is a significant additional strength in coping and making meaning.  Provided reflective listening, emotional support, pastoral reflection, and affirmation of strengths.  We plan to follow up at future treatments, and she knows to reach out as needed as well.   [place], [place], [facility] Pager [phone] Voicemail [phone]

## 2022-05-11 NOTE — Progress Notes (Signed)
Labs reviewed by Dr Burr Medico and "ok to proceed".

## 2022-05-11 NOTE — Telephone Encounter (Signed)
CRITICAL VALUE STICKER  CRITICAL VALUE: Calcium 13.4  RECEIVER (on-site recipient of call): Maurine Simmering CMA  DATE & TIME NOTIFIED: 05/11/22 1301  MESSENGER (representative from lab): Verdis Frederickson  MD NOTIFIED: Dr. Burr Medico  TIME OF NOTIFICATION: 1301  RESPONSE: Notified Dr. Burr Medico

## 2022-05-12 ENCOUNTER — Telehealth: Payer: Self-pay | Admitting: Hematology

## 2022-05-12 ENCOUNTER — Telehealth: Payer: Self-pay | Admitting: Interventional Radiology

## 2022-05-12 LAB — T4: T4, Total: 10.7 ug/dL (ref 4.5–12.0)

## 2022-05-12 NOTE — Telephone Encounter (Signed)
Mindi Junker at [facility]. Pt has appt with them on 06/13/22 at 11:00am

## 2022-05-12 NOTE — Telephone Encounter (Signed)
Called patient to schedule next appointments. Patient scheduled and notified.

## 2022-05-13 ENCOUNTER — Other Ambulatory Visit: Payer: Self-pay

## 2022-05-15 ENCOUNTER — Inpatient Hospital Stay: Payer: 59

## 2022-05-15 ENCOUNTER — Other Ambulatory Visit: Payer: 59

## 2022-05-15 ENCOUNTER — Ambulatory Visit: Payer: 59

## 2022-05-17 ENCOUNTER — Inpatient Hospital Stay: Payer: 59

## 2022-05-17 ENCOUNTER — Other Ambulatory Visit: Payer: 59

## 2022-05-17 ENCOUNTER — Inpatient Hospital Stay: Payer: 59 | Admitting: Hematology

## 2022-05-17 ENCOUNTER — Ambulatory Visit: Payer: 59

## 2022-05-19 ENCOUNTER — Inpatient Hospital Stay: Payer: 59

## 2022-05-19 ENCOUNTER — Telehealth: Payer: Self-pay

## 2022-05-19 ENCOUNTER — Encounter: Payer: Self-pay | Admitting: General Practice

## 2022-05-19 ENCOUNTER — Other Ambulatory Visit: Payer: Self-pay

## 2022-05-19 VITALS — BP 170/88 | HR 75 | Temp 97.7°F | Resp 17 | Wt 228.5 lb

## 2022-05-19 DIAGNOSIS — E860 Dehydration: Secondary | ICD-10-CM

## 2022-05-19 DIAGNOSIS — Z5112 Encounter for antineoplastic immunotherapy: Secondary | ICD-10-CM | POA: Diagnosis not present

## 2022-05-19 DIAGNOSIS — C221 Intrahepatic bile duct carcinoma: Secondary | ICD-10-CM

## 2022-05-19 DIAGNOSIS — C220 Liver cell carcinoma: Secondary | ICD-10-CM

## 2022-05-19 LAB — CBC WITH DIFFERENTIAL (CANCER CENTER ONLY)
Abs Immature Granulocytes: 0.04 10*3/uL (ref 0.00–0.07)
Basophils Absolute: 0 10*3/uL (ref 0.0–0.1)
Basophils Relative: 0 %
Eosinophils Absolute: 0.3 10*3/uL (ref 0.0–0.5)
Eosinophils Relative: 4 %
HCT: 38.9 % (ref 36.0–46.0)
Hemoglobin: 13.3 g/dL (ref 12.0–15.0)
Immature Granulocytes: 1 %
Lymphocytes Relative: 35 %
Lymphs Abs: 2.5 10*3/uL (ref 0.7–4.0)
MCH: 33.4 pg (ref 26.0–34.0)
MCHC: 34.2 g/dL (ref 30.0–36.0)
MCV: 97.7 fL (ref 80.0–100.0)
Monocytes Absolute: 0.5 10*3/uL (ref 0.1–1.0)
Monocytes Relative: 7 %
Neutro Abs: 3.8 10*3/uL (ref 1.7–7.7)
Neutrophils Relative %: 53 %
Platelet Count: 106 10*3/uL — ABNORMAL LOW (ref 150–400)
RBC: 3.98 MIL/uL (ref 3.87–5.11)
RDW: 13.3 % (ref 11.5–15.5)
WBC Count: 7.1 10*3/uL (ref 4.0–10.5)
nRBC: 0.4 % — ABNORMAL HIGH (ref 0.0–0.2)

## 2022-05-19 LAB — CMP (CANCER CENTER ONLY)
ALT: 122 U/L — ABNORMAL HIGH (ref 0–44)
AST: 176 U/L (ref 15–41)
Albumin: 3.4 g/dL — ABNORMAL LOW (ref 3.5–5.0)
Alkaline Phosphatase: 146 U/L — ABNORMAL HIGH (ref 38–126)
Anion gap: 6 (ref 5–15)
BUN: 14 mg/dL (ref 8–23)
CO2: 23 mmol/L (ref 22–32)
Calcium: 11.6 mg/dL — ABNORMAL HIGH (ref 8.9–10.3)
Chloride: 107 mmol/L (ref 98–111)
Creatinine: 1.51 mg/dL — ABNORMAL HIGH (ref 0.44–1.00)
GFR, Estimated: 38 mL/min — ABNORMAL LOW (ref >60)
Glucose, Bld: 86 mg/dL (ref 70–99)
Potassium: 4.3 mmol/L (ref 3.5–5.1)
Sodium: 136 mmol/L (ref 135–145)
Total Bilirubin: 0.4 mg/dL (ref 0.3–1.2)
Total Protein: 7.3 g/dL (ref 6.5–8.1)

## 2022-05-19 MED ORDER — SODIUM CHLORIDE 0.9 % IV SOLN: INTRAVENOUS | Status: AC

## 2022-05-19 NOTE — Progress Notes (Signed)
Per Dr. Burr Medico, hold treatment today due to labs. Pt to receive 1L NS over 2 hrs.

## 2022-05-19 NOTE — Progress Notes (Signed)
[facility] Spiritual Care Note  Followed up with Ms Connie [name] in infusion after she learned that she wouldn't receive treatment today. She states that she is worried about postponed appointments and treatment because she is worried about disease progression in the interim. Mortality and life expectancy were particularly on her mind today.  Ms Connie [name] values emotional support and encouragement from [place]. We plan to follow up by phone next week for another opportunity to process priorities, sources of meaning, and life questions.   [place], [place], [facility] Pager [phone] Voicemail [phone]

## 2022-05-19 NOTE — Telephone Encounter (Signed)
CRITICAL VALUE STICKER  CRITICAL VALUE: AST: 176  RECEIVER (on-site recipient of call): Mena Goes, RN  DATE & TIME NOTIFIED: 05/19/22 @ 0913  MESSENGER (representative from lab): Deidre Ala  MD NOTIFIED: Dr. Burr Medico  TIME OF NOTIFICATION: 05/19/22 @ 0915  RESPONSE:  MD aware - awaiting response

## 2022-05-20 LAB — CANCER ANTIGEN 19-9: CA 19-9: 1521 U/mL — ABNORMAL HIGH (ref 0–35)

## 2022-05-20 LAB — AFP TUMOR MARKER: AFP, Serum, Tumor Marker: 381 ng/mL — ABNORMAL HIGH (ref 0.0–9.2)

## 2022-05-23 ENCOUNTER — Encounter: Payer: Self-pay | Admitting: General Practice

## 2022-05-23 NOTE — Progress Notes (Signed)
[facility] Spiritual Care Note  Attempted two follow-up calls, but Ms Connie [name] was sleeping and not feeling well. Plan to follow up at a future treatment.   [place], [place], [facility] Pager [phone] Voicemail [phone]

## 2022-05-26 ENCOUNTER — Other Ambulatory Visit: Payer: Self-pay | Admitting: Radiology

## 2022-05-29 ENCOUNTER — Other Ambulatory Visit: Payer: Self-pay

## 2022-05-29 ENCOUNTER — Ambulatory Visit (HOSPITAL_COMMUNITY)
Admission: RE | Admit: 2022-05-29 | Discharge: 2022-05-29 | Disposition: A | Discharge status: Home / Self Care | Payer: 59 | Source: Ambulatory Visit | Attending: Hematology | Admitting: Hematology

## 2022-05-29 ENCOUNTER — Encounter (HOSPITAL_COMMUNITY): Payer: Self-pay

## 2022-05-29 DIAGNOSIS — C221 Intrahepatic bile duct carcinoma: Secondary | ICD-10-CM | POA: Diagnosis not present

## 2022-05-29 DIAGNOSIS — I4891 Unspecified atrial fibrillation: Secondary | ICD-10-CM | POA: Insufficient documentation

## 2022-05-29 DIAGNOSIS — I129 Hypertensive chronic kidney disease with stage 1 through stage 4 chronic kidney disease, or unspecified chronic kidney disease: Secondary | ICD-10-CM | POA: Diagnosis not present

## 2022-05-29 DIAGNOSIS — K7030 Alcoholic cirrhosis of liver without ascites: Secondary | ICD-10-CM | POA: Diagnosis not present

## 2022-05-29 DIAGNOSIS — N189 Chronic kidney disease, unspecified: Secondary | ICD-10-CM | POA: Insufficient documentation

## 2022-05-29 HISTORY — PX: IR IMAGING GUIDED PORT INSERTION: IMG5740

## 2022-05-29 MED ORDER — LIDOCAINE HCL 1 % IJ SOLN
INTRAMUSCULAR | Status: AC
  Administered 2022-05-29: 10 mL via INTRADERMAL
  Filled 2022-05-29: qty 20

## 2022-05-29 MED ORDER — SODIUM CHLORIDE 0.9 % IV SOLN: INTRAVENOUS | Status: DC

## 2022-05-29 MED ORDER — MIDAZOLAM HCL 2 MG/2ML IJ SOLN
INTRAMUSCULAR | Status: AC | PRN
  Administered 2022-05-29 (×2): 1 mg via INTRAVENOUS

## 2022-05-29 MED ORDER — FENTANYL CITRATE (PF) 100 MCG/2ML IJ SOLN
INTRAMUSCULAR | Status: AC | PRN
  Administered 2022-05-29 (×2): 50 ug via INTRAVENOUS

## 2022-05-29 MED ORDER — FENTANYL CITRATE (PF) 100 MCG/2ML IJ SOLN
INTRAMUSCULAR | Status: AC
  Filled 2022-05-29: qty 2

## 2022-05-29 MED ORDER — HEPARIN SOD (PORK) LOCK FLUSH 100 UNIT/ML IV SOLN
INTRAVENOUS | Status: AC
  Administered 2022-05-29: 500 [IU] via INTRAVENOUS
  Filled 2022-05-29: qty 5

## 2022-05-29 MED ORDER — MIDAZOLAM HCL 2 MG/2ML IJ SOLN
INTRAMUSCULAR | Status: AC
  Filled 2022-05-29: qty 4

## 2022-05-29 NOTE — Assessment & Plan Note (Signed)
-

## 2022-05-29 NOTE — H&P (Signed)
Referring Physician(s): Feng,Yan  Supervising Physician: Aletta Edouard  Patient Status:  [facility]  Chief Complaint:  "I'm getting a port a cath"  Subjective: Pt known to IR team from right hepatic mass biopsy on 04/04/22. She has a hx of alcoholic cirrhosis, tobacco use, CKD, afib, hepatitis C, HTN, and mixed HCC/bile duct carcinoma. She has poor venous access and is scheduled today for port a cath placement to assist with treatment. She denies fever,HA, CP,dyspnea, cough, abd/back pain,N/V or bleeding.    Past Medical History:  Diagnosis Date   Alcohol abuse    Alcoholic cirrhosis of liver (HCC)    Atrial fibrillation with RVR ([place]) 08/02/2019   Cancer (HCC)    Cataract    Chronic kidney disease    Helicobacter pylori gastritis 08/07/2019   Hepatitis C antibody positive in blood - negative RNA    Hypertension    Pancreatic pseudocyst 03/04/2021   Past Surgical History:  Procedure Laterality Date   BIOPSY  08/03/2019   Procedure: BIOPSY;  Surgeon: Gatha Mayer, MD;  Location[facility] ENDOSCOPY;  Service: Endoscopy;;   ESOPHAGOGASTRODUODENOSCOPY (EGD) WITH PROPOFOL N/A 08/03/2019   Procedure: ESOPHAGOGASTRODUODENOSCOPY (EGD) WITH PROPOFOL;  Surgeon: Gatha Mayer, MD;  Location: [facility] ENDOSCOPY;  Service: Endoscopy;  Laterality: N/A;   IR RADIOLOGIST EVAL & MGMT  02/14/2022   IR RADIOLOGIST EVAL & MGMT  03/14/2022      Allergies: Patient has no known allergies.  Medications: Prior to Admission medications   Medication Sig Start Date End Date Taking? Authorizing Provider  acetaminophen (TYLENOL) 500 MG tablet Take 1,000 mg by mouth every 6 (six) hours as needed for mild pain.   Yes [provider]  amLODipine (NORVASC) 5 MG tablet Take 1 tablet by mouth daily. 08/25/21  Yes [provider]  gabapentin (NEURONTIN) 100 MG capsule Take 1 capsule (100 mg total) by mouth 3 (three) times daily. 03/06/21 05/29/22 Yes Lavina Hamman, MD  latanoprost (XALATAN) 0.005  % ophthalmic solution Place 1 drop into both eyes at bedtime.   Yes [provider]  lipase/protease/amylase (CREON) 12000-38000 units CPEP capsule Take 1 capsule (12,000 Units total) by mouth 3 (three) times daily with meals. 03/06/21  Yes Lavina Hamman, MD  losartan (COZAAR) 50 MG tablet Take 50 mg by mouth every morning. 03/14/22  Yes [provider]  ondansetron (ZOFRAN) 8 MG tablet Take 1 tablet (8 mg total) by mouth every 8 (eight) hours as needed for nausea or vomiting. 03/21/22  Yes Truitt Merle, MD  polyethylene glycol (MIRALAX / GLYCOLAX) 17 g packet Take 17 g by mouth 2 (two) times daily. 03/06/21  Yes Lavina Hamman, MD  prochlorperazine (COMPAZINE) 10 MG tablet Take 1 tablet (10 mg total) by mouth every 6 (six) hours as needed for nausea or vomiting. 03/21/22  Yes Truitt Merle, MD  diclofenac Sodium (VOLTAREN) 1 % GEL Apply 2 g topically 2 (two) times daily as needed (pain). Patient not taking: Reported on 04/28/2022    [provider]  metoprolol succinate (TOPROL-XL) 25 MG 24 hr tablet Take 1 tablet (25 mg total) by mouth daily. Patient not taking: Reported on 04/20/2022 08/04/19   Earlene Plater, MD  pantoprazole (PROTONIX) 40 MG tablet Take 1 tablet (40 mg total) by mouth 2 (two) times daily. Patient taking differently: Take 40 mg by mouth daily. 08/04/19 02/21/21  Earlene Plater, MD     Vital Signs:BP 170/88  HR 75  R 17 O2 sats 100% RA  Ht '5\' 5"'$  (1.651 m)   Wt 227 lb 1.2 oz (103 kg)   BMI 37.79 kg/m   Physical Exam awake/alert; chest- distant BS bilat; heart- RRR; abd- obese, soft,+BS,NT; trace pretibial edema bilat  Imaging: No results found.  Labs:  CBC: Recent Labs    04/05/22 0905 04/20/22 0903 05/11/22 1207 05/19/22 0813  WBC 10.0 10.1 11.7* 7.1  HGB 15.4* 15.7* 14.3 13.3  HCT 45.0 45.9 42.5 38.9  PLT 209 195 191 106*    COAGS: Recent Labs    03/13/22 1141 04/04/22 1110  INR 1.4* 1.5*    BMP: Recent Labs     04/05/22 0905 04/20/22 0903 05/11/22 1207 05/19/22 0813  NA 138 139 137 136  K 4.1 4.1 4.3 4.3  CL 106 107 107 107  CO2 '26 25 24 23  '$ GLUCOSE 106* 102* 92 86  BUN '13 13 20 14  '$ CALCIUM 12.4* 12.2* 13.4* 11.6*  CREATININE 1.33* 1.48* 1.92* 1.51*  GFRNONAA 44* 39* 28* 38*    LIVER FUNCTION TESTS: Recent Labs    04/05/22 0905 04/20/22 0903 05/11/22 1207 05/19/22 0813  BILITOT 0.7 0.6 0.8 0.4  AST 96* 98* 97* 176*  ALT 47* 34 38 122*  ALKPHOS 168* 171* 148* 146*  PROT 8.4* 8.5* 8.2* 7.3  ALBUMIN 3.9 3.8 3.8 3.4*    Assessment and Plan: Pt known to IR team from right hepatic mass biopsy on 04/04/22. She has a hx of alcoholic cirrhosis, tobacco use, CKD, afib, hepatitis C, HTN, and mixed HCC/bile duct carcinoma. She has poor venous access and is scheduled today for port a cath placement to assist with treatment.Risks and benefits of image guided port-a-catheter placement was discussed with the patient including, but not limited to bleeding, infection, pneumothorax, or fibrin sheath development and need for additional procedures.  All of the patient's questions were answered, patient is agreeable to proceed. Consent signed and in chart.    Electronically Signed: D. Rowe Robert, PA-C 05/29/2022, 1:00 PM   I spent a total of 25 minutes at the the patient's bedside AND on the patient's hospital floor or unit, greater than 50% of which was counseling/coordinating care for port a cath placement

## 2022-05-29 NOTE — Assessment & Plan Note (Signed)
-  Stage IVA (cT3, cN1, cM0)  with diffuse liver mets, unresectable  -Liver cirrhosis and a 2.5cm mass was found in 07/2019 but she did not follow up -due to rising AFP, she had MRI on 02/06/2022 which showed extensive liver lesions (largest 13cm) throughout both lobes with marked progression from prior MRI; enlarged upper abdominal lymph nodes, no distant mets -she started Durvaluamb every 4 weeks, and Tremelimumab '300mg'$  once on day 1 of cycle 1, on 03/21/2022.  -Although she had typical image findings of [place], she underwent liver biopsy since CA 19-9 was also elevated  -liver biopsy showed well to moderately differentiated CK7 positive adenocarcinoma and her EGD was negative for upper GI tract malignancy. Tumor cells were also positive for Glycipan 3, supporting mixed HCC and cholagiocarcinoma. -she started GEMOX (cisplatin is contraindicated due to her chronic kidney disease) and continued durvalumab on 05/10/22

## 2022-05-29 NOTE — Assessment & Plan Note (Signed)
-  f/u with GI, will watch her LFTs closely on chemo

## 2022-05-29 NOTE — Assessment & Plan Note (Signed)
-  Probably related to her underlying malignancy -She previously received Aredia 2 doses -Calcium level has come down significantly since she started chemotherapy.

## 2022-05-29 NOTE — Discharge Instructions (Signed)
For questions /concerns may call Interventional Radiology at [phone] or  Interventional Radiology clinic [phone]   You may remove your dressing and shower tomorrow afternoon  DO NOT use EMLA cream for 2 weeks after port placement as the cream will remove surgical glue on your incision.    Implanted Port Insertion, Care After This sheet gives you information about how to care for yourself after your procedure. Your health care provider may also give you more specific instructions. If you have problems or questions, contact your health careprovider. What can I expect after the procedure? After the procedure, it is common to have: Discomfort at the port insertion site. Bruising on the skin over the port. This should improve over 3-4 days. Follow these instructions at home: [facility] care After your port is placed, you will get a manufacturer's information card. The card has information about your port. Keep this card with you at all times. Take care of the port as told by your health care provider. Ask your health care provider if you or a family member can get training for taking care of the port at home. A home health care nurse may also take care of the port. Make sure to remember what type of port you have. Incision care Follow instructions from your health care provider about how to take care of your port insertion site. Make sure you: Wash your hands with soap and water before and after you change your bandage (dressing). If soap and water are not available, use hand sanitizer. Change your dressing as told by your health care provider. Leave skin glue, or adhesive strips in place. These skin closures may need to stay in place for 2 weeks or longer.  Check your port insertion site every day for signs of infection. Check for:      - Redness, swelling, or pain.                     - Fluid or blood.      - Warmth.      - Pus or a bad smell. Activity Return to your normal activities as  told by your health care provider. Ask your health care provider what activities are safe for you. Do not lift anything that is heavier than 10 lb (4.5 kg), or the limit that you are told, until your health care provider says that it is safe. General instructions Take over-the-counter and prescription medicines only as told by your health care provider. Do not take baths, swim, or use a hot tub until your health care provider approves. Ask your health care provider if you may take showers. You may only be allowed to take sponge baths. Do not drive for 24 hours if you were given a sedative during your procedure. Wear a medical alert bracelet in case of an emergency. This will tell any health care providers that you have a port. Keep all follow-up visits as told by your health care provider. This is important. Contact a health care provider if: You cannot flush your port with saline as directed, or you cannot draw blood from the port. You have a fever or chills. You have redness, swelling, or pain around your port insertion site. You have fluid or blood coming from your port insertion site. Your port insertion site feels warm to the touch. You have pus or a bad smell coming from the port insertion site. Get help right away if: You have chest pain or shortness of  breath. You have bleeding from your port that you cannot control. Summary Take care of the port as told by your health care provider. Keep the manufacturer's information card with you at all times. Change your dressing as told by your health care provider. Contact a health care provider if you have a fever or chills or if you have redness, swelling, or pain around your port insertion site. Keep all follow-up visits as told by your health care provider. This information is not intended to replace advice given to you by your health care provider. Make sure you discuss any questions you have with your healthcare provider. Document Revised:  11/13/2017 Document Reviewed: 11/13/2017    Moderate Conscious Sedation, Adult, Care After This sheet gives you information about how to care for yourself after your procedure. Your health care provider may also give you more specific instructions. If you have problems or questions, contact your health careprovider. What can I expect after the procedure? After the procedure, it is common to have: Sleepiness for several hours. Impaired judgment for several hours. Difficulty with balance. Vomiting if you eat too soon. Follow these instructions at home: For the time period you were told by your health care provider: Rest. Do not participate in activities where you could fall or become injured. Do not drive or use machinery. Do not drink alcohol. Do not take sleeping pills or medicines that cause drowsiness. Do not make important decisions or sign legal documents. Do not take care of children on your own. Eating and drinking  Follow the diet recommended by your health care provider. Drink enough fluid to keep your urine pale yellow. If you vomit: Drink water, juice, or soup when you can drink without vomiting. Make sure you have little or no nausea before eating solid foods.  General instructions Take over-the-counter and prescription medicines only as told by your health care provider. Have a responsible adult stay with you for the time you are told. It is important to have someone help care for you until you are awake and alert. Do not smoke. Keep all follow-up visits as told by your health care provider. This is important. Contact a health care provider if: You are still sleepy or having trouble with balance after 24 hours. You feel light-headed. You keep feeling nauseous or you keep vomiting. You develop a rash. You have a fever. You have redness or swelling around the IV site. Get help right away if: You have trouble breathing. You have new-onset confusion at  home. Summary After the procedure, it is common to feel sleepy, have impaired judgment, or feel nauseous if you eat too soon. Rest after you get home. Know the things you should not do after the procedure. Follow the diet recommended by your health care provider and drink enough fluid to keep your urine pale yellow. Get help right away if you have trouble breathing or new-onset confusion at home. This information is not intended to replace advice given to you by your health care provider. Make sure you discuss any questions you have with your healthcare provider. Document Revised: 08/15/2019 Document Reviewed: 03/13/2019 Elsevier Patient Education  2022 [employer]  

## 2022-05-29 NOTE — Procedures (Signed)
Interventional Radiology Procedure Note  Procedure: Single Lumen Power Port Placement    Access:  Right IJ vein.  Findings: Catheter tip positioned at SVC/RA junction. Port is ready for immediate use.   Complications: None  EBL: < 10 mL  Recommendations:  - Ok to shower in 24 hours - Do not submerge for 7 days - Routine line care   Avid Guillette T. Mickie Badders, M.D Pager:  [phone]   

## 2022-05-30 ENCOUNTER — Other Ambulatory Visit: Payer: 59

## 2022-05-30 MED FILL — Dexamethasone Sodium Phosphate Inj 100 MG/10ML: INTRAMUSCULAR | Qty: 1 | Status: AC

## 2022-05-30 NOTE — Progress Notes (Signed)
[facility]   [phone] Fax:(336) [phone]   Clinic Follow up Note   Patient Care Team: Loura Back, NP as PCP - General (Nurse Practitioner) Thurmon Fair, MD as PCP - Cardiology (Cardiology) Annamarie Major, CRNP as Nurse Practitioner (Transplant Hepatology) Simonne Come, MD as Consulting Physician (Interventional Radiology) Malachy Mood, MD as Consulting Physician (Hematology and Oncology)  Date of Service:  05/31/2022  CHIEF COMPLAINT: f/u of HCC    CURRENT THERAPY:  Durvaluamb ,Gemox   ASSESSMENT:  Connie [name] is a 67 y.o. 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  -Although she had typical image findings of HCC, she underwent liver biopsy since CA 19-9 was also elevated  -liver biopsy showed well to moderately differentiated CK7 positive adenocarcinoma and her EGD was negative for upper GI tract malignancy. Tumor cells were also positive for Glycipan 3, supporting mixed HCC and cholagiocarcinoma. -she started GEMOX (cisplatin is contraindicated due to her chronic kidney disease) and continued durvalumab on 05/10/22  CKD (chronic kidney disease) stage 3, GFR 30-59 ml/min (HCC) will monitor renal function closely during chemo     Alcoholic cirrhosis of liver with ascites (HCC) -f/u with GI, will watch her LFTs closely on chemo     Hypercalcemia -Probably related to her underlying malignancy -She previously received Aredia 2 doses -Calcium level has come down  significantly since she started chemotherapy.     PLAN: -Pt seems to be tolerating chemo well. 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  Underlying hepatic cirrhosis suspected.   2.5 cm indeterminate central hypoechoic hepatic lesion. See above comment.   02/22/2021 Imaging   CLINICAL DATA:  Right-sided abdominal pain.   EXAM: ABDOMEN ULTRASOUND COMPLETE  IMPRESSION: 1. Cirrhosis and small ascites. 2. Gallbladder sludge. No sonographic evidence of acute cholecystitis.   02/22/2021 Imaging   CLINICAL DATA:  Abdominal pain.  Concern for diverticulitis.   EXAM: CT ABDOMEN AND PELVIS WITH CONTRAST  IMPRESSION: 1. Acute pancreatitis. No abscess or pseudocyst. 2. Cirrhosis with small ascites. 3. Colonic diverticulosis. No bowel obstruction. Normal appendix. 4. Aortic Atherosclerosis (ICD10-I70.0).   03/02/2021 Tumor Marker   CA 19-9 = 45 (^) AFP = 11 (^)   03/03/2021 Imaging   CLINICAL DATA:  Pancreatitis, worsening abdominal pain.   EXAM: CT ABDOMEN AND PELVIS WITH CONTRAST  IMPRESSION: 1. Multiple suspicious lesions in the right hepatic lobe. Patient has underlying cirrhosis and findings  are concerning for multifocal hepatocellular carcinoma. Metastatic disease is also in the differential diagnosis. Recommend MRI of the abdomen with  and without contrast. If the patient cannot tolerate a MRI, consider a multiphase liver CT. 2. Increased inflammatory changes in the upper abdomen compatible with pancreatitis and a large developing pseudocyst. This is developing pseudocyst measures up to 14 cm in size. 3. Cirrhosis with evidence of portal hypertension demonstrated by esophageal varices and varices draining into the IVC. 4. Indeterminate 3 mm pulmonary nodule in the lingula. Management of this pulmonary nodule depends on the etiology of the liver lesions.   03/04/2021 Imaging   EXAM: MRI ABDOMEN WITHOUT AND WITH CONTRAST (INCLUDING MRCP)  IMPRESSION: 1. Multiple enhancing lesions are noted throughout the right lobe of the liver, as detailed above. These have indeterminate imaging characteristics. Given the background of hepatic cirrhosis, the possibility of multifocal infiltrative hepatocellular carcinoma should be considered, although none of these lesions meet definite imaging criteria to establish that diagnosis. Correlation with AFP level is recommended. Other differential considerations include malignant and benign etiologies such as fibrolamellar hepatocellular carcinoma and multifocal focal nodular hyperplasia (FNH). Consideration for repeat abdominal MRI with and without IV gadolinium (Eovist) is also suggested, although biopsy may ultimately be required to establish a tissue diagnosis. 2. 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 3. Trace volume of perihepatic ascites.   09/22/2021 Tumor Marker   CA 19-9 = 190 (^) AFP = 124 (^)   01/19/2022 Tumor Marker   CA 19-9 = 816 (^) AFP = 307 (^)   02/06/2022 Imaging   EXAM: MRI ABDOMEN WITHOUT AND WITH CONTRAST (INCLUDING MRCP)  IMPRESSION: 1. Morphologic features of the liver compatible with cirrhosis. 2. Extensive enhancing liver lesions are identified throughout both lobes of liver which demonstrate  marked progression compared with the previous exam. Enhancing lesions demonstrate washout on the delayed images. In the setting of cirrhosis with progressively elevated alpha fetoprotein levels imaging findings are highly worrisome for multifocal hepatocellular carcinoma. 3. Enlarged upper abdominal lymph nodes identified. Cannot exclude underlying nodal metastasis. 4. There are several lesions within the central mesentery in the distribution of previous large pseudo cysts. The largest measures 5.0 x 3.1 cm. This is favored to represent sequelae of prior hemorrhagic pancreatitis with pseudocyst formation.   02/23/2022 Initial Diagnosis   Hepatocellular carcinoma (HCC)   03/20/2022 Cancer Staging   Staging form: Liver, AJCC 8th Edition - Clinical stage from 03/20/2022: Stage IVA (cT3, cN1, cM0) - Signed by Malachy Mood, MD on 03/20/2022   03/21/2022 - 04/20/2022 Chemotherapy   Patient is on Treatment Plan : LUNG NSCLC Durvalumab (1500) q28d     04/04/2022 Pathology Results   A. LIVER, RIGHT LOBE, BIOPSY: Well to moderately differentiated CK7 positive adenocarcinoma (see comment)  COMMENT:  Given the cytohistomorphology and this immunohistochemical pattern of cytokeratin 7 positivity, the differential diagnosis would include a pancreaticobiliary or upper gastrointestinal intestinal tract primary or possibly a lung primary (not favored).    05/11/2022 -  Chemotherapy   Patient is on Treatment Plan : BLADDER Durvalumab (10) q14d     Cholangiocarcinoma of liver (HCC)  04/23/2022 Initial Diagnosis   Cholangiocarcinoma of liver (HCC)   05/11/2022 - 05/11/2022 Chemotherapy   Patient is on Treatment Plan : BILIARY TRACT Cisplatin + Gemcitabine D1,8 q21d     05/11/2022 -  Chemotherapy   Patient is on Treatment Plan : PANCREAS GemOx q14d        INTERVAL HISTORY:  Connie [name] is here for a follow up of  [facility]  She was last seen by me on 05/11/2022 She presents to the clinic alone.Pt states  that last treatment went fine. Pt denies having nausea and vomiting and diarrhea. Pt reports her appetite is very good.    All other systems were reviewed with the patient and are negative.  MEDICAL HISTORY:  Past Medical History:  Diagnosis Date   Alcohol abuse    Alcoholic cirrhosis of liver (HCC)    Atrial fibrillation with RVR (HCC) 08/02/2019   Cancer (HCC)    Cataract    Chronic kidney disease    Helicobacter pylori gastritis 08/07/2019   Hepatitis C antibody positive in blood - negative RNA    Hypertension    Pancreatic pseudocyst 03/04/2021    SURGICAL HISTORY: Past Surgical History:  Procedure Laterality Date   BIOPSY  08/03/2019   Procedure: BIOPSY;  Surgeon: Iva Boop, MD;  Location[facility] ENDOSCOPY;  Service: Endoscopy;;   ESOPHAGOGASTRODUODENOSCOPY (EGD) WITH PROPOFOL N/A 08/03/2019   Procedure: ESOPHAGOGASTRODUODENOSCOPY (EGD) WITH PROPOFOL;  Surgeon: Iva Boop, MD;  Location[facility] ENDOSCOPY;  Service: Endoscopy;  Laterality: N/A;   IR IMAGING GUIDED PORT INSERTION  05/29/2022   IR RADIOLOGIST EVAL & MGMT  02/14/2022   IR RADIOLOGIST EVAL & MGMT  03/14/2022    I have reviewed the social history and family history with the patient and they are unchanged from previous note.  ALLERGIES:  has No Known Allergies.  MEDICATIONS:  Current Outpatient Medications  Medication Sig Dispense Refill   acetaminophen (TYLENOL) 500 MG tablet Take 1,000 mg by mouth every 6 (six) hours as needed for mild pain.     amLODipine (NORVASC) 5 MG tablet Take 1 tablet by mouth daily.     latanoprost (XALATAN) 0.005 % ophthalmic solution Place 1 drop into both eyes at bedtime.     losartan (COZAAR) 50 MG tablet Take 50 mg by mouth every morning.     polyethylene glycol (MIRALAX / GLYCOLAX) 17 g packet Take 17 g by mouth 2 (two) times daily. 14 each 0   diclofenac Sodium (VOLTAREN) 1 % GEL Apply 2 g topically 2 (two) times daily as needed (pain). (Patient not taking: Reported on  04/28/2022)     gabapentin (NEURONTIN) 100 MG capsule Take 1 capsule (100 mg total) by mouth 3 (three) times daily. 90 capsule 2   lipase/protease/amylase (CREON) 12000-38000 units CPEP capsule Take 1 capsule (12,000 Units total) by mouth 3 (three) times daily with meals. 270 capsule 2   ondansetron (ZOFRAN) 8 MG tablet Take 1 tablet (8 mg total) by mouth every 8 (eight) hours as needed for nausea or vomiting. 20 tablet 2   pantoprazole (PROTONIX) 40 MG tablet Take 1 tablet (40 mg total) by mouth daily. 90 tablet 1   prochlorperazine (COMPAZINE) 10 MG tablet Take 1 tablet (10 mg total) by mouth every 6 (six) hours as needed for nausea or vomiting. 30 tablet 2   No current facility-administered medications for this visit.    PHYSICAL EXAMINATION: ECOG PERFORMANCE STATUS: 0 - Asymptomatic  Vitals:   05/31/22 0800  BP: 118/63  Pulse: 74  Resp: 18  Temp: 97.6 F (36.4 C)  SpO2: 100%   Wt Readings from Last 3 Encounters:  05/31/22 227 lb 4.8 oz (103.1 kg)  05/29/22 227 lb 1.2 oz (103 kg)  05/19/22 228 lb 8 oz (103.6 kg)     GENERAL:alert, no distress and comfortable SKIN: skin color normal,  no rashes or significant lesions EYES: normal, Conjunctiva are pink and non-injected, sclera clear  NEURO: alert & oriented x 3 with fluent speech   LABORATORY DATA:  I have reviewed the data as listed    Latest Ref Rng & Units 05/31/2022    7:54 AM 05/19/2022    8:13 AM 05/11/2022   12:07 PM  CBC  WBC 4.0 - 10.5 K/uL 9.6  7.1  11.7   Hemoglobin 12.0 - 15.0 g/dL 84.6  96.2  95.2   Hematocrit 36.0 - 46.0 % 36.9  38.9  42.5   Platelets 150 - 400 K/uL 374  106  191         Latest Ref Rng & Units 05/31/2022    7:54 AM 05/19/2022    8:13 AM 05/11/2022   12:07 PM  CMP  Glucose 70 - 99 mg/dL 91  86  92   BUN 8 - 23 mg/dL 17  14  20    Creatinine 0.44 - 1.00 mg/dL 8.41  3.24  4.01   Sodium 135 - 145 mmol/L 139  136  137   Potassium 3.5 - 5.1 mmol/L 4.0  4.3  4.3   Chloride 98 - 111 mmol/L  107  107  107   CO2 22 - 32 mmol/L 25  23  24    Calcium 8.9 - 10.3 mg/dL 02.7  25.3  66.4   Total Protein 6.5 - 8.1 g/dL 7.4  7.3  8.2   Total Bilirubin 0.3 - 1.2 mg/dL 0.5  0.4  0.8   Alkaline Phos 38 - 126 U/L 136  146  148   AST 15 - 41 U/L 136  176  97   ALT 0 - 44 U/L 84  122  38       RADIOGRAPHIC STUDIES: I have personally reviewed the radiological images as listed and agreed with the findings in the report. IR IMAGING GUIDED PORT INSERTION  Result Date: 05/29/2022 CLINICAL DATA:  Hepatocellular carcinoma and need for porta cath for chemotherapy. EXAM: IMPLANTED PORT A CATH PLACEMENT WITH ULTRASOUND AND FLUOROSCOPIC GUIDANCE ANESTHESIA/SEDATION: Moderate (conscious) sedation was employed during this procedure. A total of Versed 2.0 mg and Fentanyl 100 mcg was administered intravenously by radiology nursing. Moderate Sedation Time: 35 minutes. The patient's level of consciousness and vital signs were monitored continuously by radiology nursing throughout the procedure under my direct supervision. FLUOROSCOPY: 42 seconds.  8.0 mGy. PROCEDURE: The procedure, risks, benefits, and alternatives were explained to the patient. Questions regarding the procedure were encouraged and answered. The patient understands and consents to the procedure. A time-out was performed prior to initiating the procedure. Ultrasound was utilized to confirm patency of the right internal jugular vein. The right neck and chest were prepped with chlorhexidine in a sterile fashion, and a sterile drape was applied covering the operative field. Maximum barrier sterile technique with sterile gowns and gloves were used for the procedure. Local anesthesia was provided with 1% lidocaine. After creating a small venotomy incision, a 21 gauge needle was advanced into the right internal jugular vein under direct, real-time ultrasound guidance. Ultrasound image documentation was performed. After securing guidewire access, an 8 Fr  dilator was placed. A J-wire was kinked to measure appropriate catheter length. A subcutaneous port pocket was then created along the upper chest wall utilizing sharp and blunt dissection. Portable cautery was utilized. The pocket was irrigated with sterile saline. A single lumen power injectable port was chosen for placement. The 8 Fr catheter was tunneled  from the port pocket site to the venotomy incision. The port was placed in the pocket. The port was secured in the pocket with two separate 2-0 Ethilon tacking sutures. External catheter was trimmed to appropriate length based on guidewire measurement. At the venotomy, an 8 Fr peel-away sheath was placed over a guidewire. The catheter was then placed through the sheath and the sheath removed. Final catheter positioning was confirmed and documented with a fluoroscopic spot image. The port was accessed with a needle and aspirated and flushed with heparinized saline. The access needle was removed. The venotomy and port pocket incisions were closed with subcutaneous 3-0 Monocryl and subcuticular 4-0 Vicryl. Dermabond was applied to both incisions. COMPLICATIONS: COMPLICATIONS None FINDINGS: After catheter placement, the tip lies at the cavo-atrial junction. The catheter aspirates normally and is ready for immediate use. IMPRESSION: Placement of single lumen port a cath via right internal jugular vein. The catheter tip lies at the cavo-atrial junction. A power injectable port a cath was placed and is ready for immediate use. Electronically Signed   By: Irish Lack M.D.   On: 05/29/2022 15:39      Orders Placed This Encounter  Procedures   CBC with Differential (Cancer Center Only)    Standing Status:   Future    Standing Expiration Date:   06/15/2023   CMP (Cancer Center only)    Standing Status:   Future    Standing Expiration Date:   06/15/2023   CBC with Differential (Cancer Center Only)    Standing Status:   Future    Standing Expiration Date:    06/29/2023   CMP (Cancer Center only)    Standing Status:   Future    Standing Expiration Date:   06/29/2023   All questions were answered. The patient knows to call the clinic with any problems, questions or concerns. No barriers to learning was detected. The total time spent in the appointment was 30 minutes.     Malachy Mood, MD 05/31/2022   Carolin Coy, CMA, am acting as scribe for Malachy Mood, MD.   I have reviewed the above documentation for accuracy and completeness, and I agree with the above.

## 2022-05-31 ENCOUNTER — Encounter: Payer: Self-pay | Admitting: Hematology

## 2022-05-31 ENCOUNTER — Other Ambulatory Visit: Payer: Self-pay

## 2022-05-31 ENCOUNTER — Inpatient Hospital Stay: Payer: 59

## 2022-05-31 ENCOUNTER — Inpatient Hospital Stay (HOSPITAL_BASED_OUTPATIENT_CLINIC_OR_DEPARTMENT_OTHER): Payer: 59 | Admitting: Hematology

## 2022-05-31 ENCOUNTER — Encounter: Payer: Self-pay | Admitting: General Practice

## 2022-05-31 VITALS — BP 118/63 | HR 74 | Temp 97.6°F | Resp 18 | Wt 227.3 lb

## 2022-05-31 DIAGNOSIS — N1831 Chronic kidney disease, stage 3a: Secondary | ICD-10-CM | POA: Diagnosis not present

## 2022-05-31 DIAGNOSIS — C221 Intrahepatic bile duct carcinoma: Secondary | ICD-10-CM

## 2022-05-31 DIAGNOSIS — Z95828 Presence of other vascular implants and grafts: Secondary | ICD-10-CM | POA: Insufficient documentation

## 2022-05-31 DIAGNOSIS — C220 Liver cell carcinoma: Secondary | ICD-10-CM

## 2022-05-31 DIAGNOSIS — K7031 Alcoholic cirrhosis of liver with ascites: Secondary | ICD-10-CM

## 2022-05-31 DIAGNOSIS — Z5112 Encounter for antineoplastic immunotherapy: Secondary | ICD-10-CM | POA: Diagnosis not present

## 2022-05-31 LAB — CBC WITH DIFFERENTIAL (CANCER CENTER ONLY)
Abs Immature Granulocytes: 0.1 10*3/uL — ABNORMAL HIGH (ref 0.00–0.07)
Basophils Absolute: 0.1 10*3/uL (ref 0.0–0.1)
Basophils Relative: 1 %
Eosinophils Absolute: 0.2 10*3/uL (ref 0.0–0.5)
Eosinophils Relative: 3 %
HCT: 36.9 % (ref 36.0–46.0)
Hemoglobin: 12.6 g/dL (ref 12.0–15.0)
Immature Granulocytes: 1 %
Lymphocytes Relative: 36 %
Lymphs Abs: 3.5 10*3/uL (ref 0.7–4.0)
MCH: 33.7 pg (ref 26.0–34.0)
MCHC: 34.1 g/dL (ref 30.0–36.0)
MCV: 98.7 fL (ref 80.0–100.0)
Monocytes Absolute: 1.6 10*3/uL — ABNORMAL HIGH (ref 0.1–1.0)
Monocytes Relative: 17 %
Neutro Abs: 4.1 10*3/uL (ref 1.7–7.7)
Neutrophils Relative %: 42 %
Platelet Count: 374 10*3/uL (ref 150–400)
RBC: 3.74 MIL/uL — ABNORMAL LOW (ref 3.87–5.11)
RDW: 15.1 % (ref 11.5–15.5)
WBC Count: 9.6 10*3/uL (ref 4.0–10.5)
nRBC: 0 % (ref 0.0–0.2)

## 2022-05-31 LAB — CMP (CANCER CENTER ONLY)
ALT: 84 U/L — ABNORMAL HIGH (ref 0–44)
AST: 136 U/L — ABNORMAL HIGH (ref 15–41)
Albumin: 3.3 g/dL — ABNORMAL LOW (ref 3.5–5.0)
Alkaline Phosphatase: 136 U/L — ABNORMAL HIGH (ref 38–126)
Anion gap: 7 (ref 5–15)
BUN: 17 mg/dL (ref 8–23)
CO2: 25 mmol/L (ref 22–32)
Calcium: 11.6 mg/dL — ABNORMAL HIGH (ref 8.9–10.3)
Chloride: 107 mmol/L (ref 98–111)
Creatinine: 1.71 mg/dL — ABNORMAL HIGH (ref 0.44–1.00)
GFR, Estimated: 33 mL/min — ABNORMAL LOW (ref >60)
Glucose, Bld: 91 mg/dL (ref 70–99)
Potassium: 4 mmol/L (ref 3.5–5.1)
Sodium: 139 mmol/L (ref 135–145)
Total Bilirubin: 0.5 mg/dL (ref 0.3–1.2)
Total Protein: 7.4 g/dL (ref 6.5–8.1)

## 2022-05-31 MED ORDER — SODIUM CHLORIDE 0.9 % IV SOLN
10.0000 mg | Freq: Once | INTRAVENOUS | Status: AC
  Administered 2022-05-31: 10 mg via INTRAVENOUS
  Filled 2022-05-31: qty 10

## 2022-05-31 MED ORDER — OXALIPLATIN CHEMO INJECTION 100 MG/20ML
85.0000 mg/m2 | Freq: Once | INTRAVENOUS | Status: AC
  Administered 2022-05-31: 185 mg via INTRAVENOUS
  Filled 2022-05-31: qty 2.08

## 2022-05-31 MED ORDER — DEXTROSE 5 % IV SOLN: Freq: Once | INTRAVENOUS | Status: AC

## 2022-05-31 MED ORDER — SODIUM CHLORIDE 0.9 % IV SOLN
16.0000 mg | Freq: Once | INTRAVENOUS | Status: AC
  Administered 2022-05-31: 16 mg via INTRAVENOUS
  Filled 2022-05-31: qty 8

## 2022-05-31 MED ORDER — SODIUM CHLORIDE 0.9% FLUSH
10.0000 mL | Freq: Once | INTRAVENOUS | Status: AC
  Administered 2022-05-31: 10 mL

## 2022-05-31 MED ORDER — PROCHLORPERAZINE MALEATE 10 MG PO TABS: 10.0000 mg | ORAL_TABLET | Freq: Four times a day (QID) | ORAL | 2 refills | Status: DC | PRN

## 2022-05-31 MED ORDER — LIDOCAINE-PRILOCAINE 2.5-2.5 % EX CREA: 1.0000 | TOPICAL_CREAM | CUTANEOUS | 0 refills | Status: DC | PRN

## 2022-05-31 MED ORDER — ONDANSETRON HCL 8 MG PO TABS: 8.0000 mg | ORAL_TABLET | Freq: Three times a day (TID) | ORAL | 2 refills | Status: DC | PRN

## 2022-05-31 MED ORDER — SODIUM CHLORIDE 0.9 % IV SOLN: Freq: Once | INTRAVENOUS | Status: AC

## 2022-05-31 MED ORDER — SODIUM CHLORIDE 0.9 % IV SOLN
1000.0000 mg/m2 | Freq: Once | INTRAVENOUS | Status: AC
  Administered 2022-05-31: 2205 mg via INTRAVENOUS
  Filled 2022-05-31: qty 57.99

## 2022-05-31 MED ORDER — PANCRELIPASE (LIP-PROT-AMYL) 12000-38000 UNITS PO CPEP: 12000.0000 [IU] | ORAL_CAPSULE | Freq: Three times a day (TID) | ORAL | 2 refills | Status: DC

## 2022-05-31 MED ORDER — SODIUM CHLORIDE 0.9% FLUSH
10.0000 mL | INTRAVENOUS | Status: DC | PRN
  Administered 2022-05-31: 10 mL

## 2022-05-31 MED ORDER — PANTOPRAZOLE SODIUM 40 MG PO TBEC: 40.0000 mg | DELAYED_RELEASE_TABLET | Freq: Every day | ORAL | 1 refills | Status: DC

## 2022-05-31 MED ORDER — HEPARIN SOD (PORK) LOCK FLUSH 100 UNIT/ML IV SOLN
500.0000 [IU] | Freq: Once | INTRAVENOUS | Status: AC | PRN
  Administered 2022-05-31: 500 [IU]

## 2022-05-31 MED ORDER — GABAPENTIN 100 MG PO CAPS: 100.0000 mg | ORAL_CAPSULE | Freq: Three times a day (TID) | ORAL | 2 refills | Status: DC

## 2022-05-31 NOTE — Progress Notes (Signed)
[facility] Spiritual Care Note  Followed up with Connie [name] and her husband in infusion. They are working on coping with their feelings and identities/roles as patient and caregiver through this treatment process. Connie [name] reported feeling in better spirits overall today, focusing more on her experience in the present moment.  Provided empathic listening, emotional support, and normalization of feelings, including working through conflict.  We plan to follow up at a future treatment.   [place], [place], [facility] Pager [phone] Voicemail [phone]

## 2022-05-31 NOTE — Patient Instructions (Signed)
[facility]  Discharge Instructions: Thank you for choosing [facility] to provide your oncology and hematology care.   If you have a lab appointment with the [facility], please go directly to the [facility] and check in at the registration area.   Wear comfortable clothing and clothing appropriate for easy access to any Portacath or PICC line.   We strive to give you quality time with your provider. You may need to reschedule your appointment if you arrive late (15 or more minutes).  Arriving late affects you and other patients whose appointments are after yours.  Also, if you miss three or more appointments without notifying the office, you may be dismissed from the clinic at the provider's discretion.      For prescription refill requests, have your pharmacy contact our office and allow 72 hours for refills to be completed.    Today you received the following chemotherapy and/or immunotherapy agents oxaliplatin, gemcitabine      To help prevent nausea and vomiting after your treatment, we encourage you to take your nausea medication as directed.  BELOW ARE SYMPTOMS THAT SHOULD BE REPORTED IMMEDIATELY: *FEVER GREATER THAN 100.4 F (38 C) OR HIGHER *CHILLS OR SWEATING *NAUSEA AND VOMITING THAT IS NOT CONTROLLED WITH YOUR NAUSEA MEDICATION *UNUSUAL SHORTNESS OF BREATH *UNUSUAL BRUISING OR BLEEDING *URINARY PROBLEMS (pain or burning when urinating, or frequent urination) *BOWEL PROBLEMS (unusual diarrhea, constipation, pain near the anus) TENDERNESS IN MOUTH AND THROAT WITH OR WITHOUT PRESENCE OF ULCERS (sore throat, sores in mouth, or a toothache) UNUSUAL RASH, SWELLING OR PAIN  UNUSUAL VAGINAL DISCHARGE OR ITCHING   Items with * indicate a potential emergency and should be followed up as soon as possible or go to the Emergency Department if any problems should occur.  Please show the CHEMOTHERAPY ALERT CARD or IMMUNOTHERAPY ALERT  CARD at check-in to the Emergency Department and triage nurse.  Should you have questions after your visit or need to cancel or reschedule your appointment, please contact [facility]  Dept: [phone]  and follow the prompts.  Office hours are 8:00 a.m. to 4:30 p.m. Monday - Friday. Please note that voicemails left after 4:00 p.m. may not be returned until the following business day.  We are closed weekends and major holidays. You have access to a nurse at all times for urgent questions. Please call the main number to the clinic Dept: [phone] and follow the prompts.   For any non-urgent questions, you may also contact your provider using MyChart. We now offer e-Visits for anyone 79 and older to request care online for non-urgent symptoms. For details visit [email or web address].   Also download the MyChart app! Go to the app store, search "MyChart", open the app, select [facility], and log in with your MyChart username and password.

## 2022-06-01 NOTE — Addendum Note (Signed)
Addended by: Truitt Merle on: 06/01/2022 10:20 AM   Modules accepted: Orders

## 2022-06-02 ENCOUNTER — Other Ambulatory Visit: Payer: Self-pay

## 2022-06-02 DIAGNOSIS — C221 Intrahepatic bile duct carcinoma: Secondary | ICD-10-CM

## 2022-06-04 ENCOUNTER — Other Ambulatory Visit: Payer: Self-pay

## 2022-06-05 ENCOUNTER — Telehealth: Payer: Self-pay | Admitting: Hematology and Oncology

## 2022-06-05 ENCOUNTER — Other Ambulatory Visit: Payer: Self-pay

## 2022-06-05 NOTE — Telephone Encounter (Signed)
Contacted patient to scheduled appointments. Left message with appointment details and a call back number if patient had any questions or could not accommodate the time we provided.   

## 2022-06-06 ENCOUNTER — Other Ambulatory Visit: Payer: Self-pay

## 2022-06-07 ENCOUNTER — Other Ambulatory Visit: Payer: Self-pay | Admitting: Hematology and Oncology

## 2022-06-07 DIAGNOSIS — C221 Intrahepatic bile duct carcinoma: Secondary | ICD-10-CM

## 2022-06-13 MED FILL — Dexamethasone Sodium Phosphate Inj 100 MG/10ML: INTRAMUSCULAR | Qty: 1 | Status: AC

## 2022-06-13 NOTE — Assessment & Plan Note (Signed)
-  Stage IVA (cT3, cN1, cM0)  with diffuse liver mets, unresectable  -Liver cirrhosis and a 2.5cm mass was found in 07/2019 but she did not follow up -due to rising AFP, she had MRI on 02/06/2022 which showed extensive liver lesions (largest 13cm) throughout both lobes with marked progression from prior MRI; enlarged upper abdominal lymph nodes, no distant mets -she started Durvaluamb every 4 weeks, and Tremelimumab '300mg'$  once on day 1 of cycle 1, on 03/21/2022.  -Although she had typical image findings of [place], she underwent liver biopsy since CA 19-9 was also elevated  -liver biopsy showed well to moderately differentiated CK7 positive adenocarcinoma and her EGD was negative for upper GI tract malignancy. Tumor cells were also positive for Glycipan 3, supporting mixed HCC and cholagiocarcinoma. -she started GEMOX (cisplatin is contraindicated due to her chronic kidney disease) and continued durvalumab on 05/10/22, she has been tolerating treatment well overall  -Plan to repeat a CT scan in 2 to 4 weeks

## 2022-06-13 NOTE — Assessment & Plan Note (Signed)
-  Probably related to her underlying malignancy -She previously received Aredia 2 doses -Calcium level has come down significantly since she started chemotherapy.

## 2022-06-13 NOTE — Assessment & Plan Note (Signed)
will monitor renal function closely during chemo

## 2022-06-13 NOTE — Progress Notes (Unsigned)
[name]   Telephone:([phone]) [phone] Fax:(336) [phone]   Clinic Follow up Note   Patient Care Team: Arthur Holms, NP as PCP - General (Nurse Practitioner) Sanda Klein, MD as PCP - Cardiology (Cardiology) Roosevelt Locks, [place] as Nurse Practitioner (Transplant Hepatology) Sandi Mariscal, MD as Consulting Physician (Interventional Radiology) Truitt Merle, MD as Consulting Physician (Hematology and Oncology)  Date of Service:  06/14/2022  CHIEF COMPLAINT: f/u of [facility]     CURRENT THERAPY:  Durvaluamb ,Gemox   ASSESSMENT:  Connie [name] is a 67 y.o. 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  -Although she had typical image findings of [place], she underwent liver biopsy since CA 19-9 was also elevated  -liver biopsy showed well to moderately differentiated CK7 positive adenocarcinoma and her EGD was negative for upper GI tract malignancy. Tumor cells were also positive for Glycipan 3, supporting mixed HCC and cholagiocarcinoma. -she started GEMOX (cisplatin is contraindicated due to her chronic kidney disease) and continued durvalumab on 05/10/22, she has been tolerating treatment well overall  -Plan to repeat a CT scan in 2 to 4 weeks    Alcoholic cirrhosis of liver with ascites ([place]) -f/u with GI, will watch her LFTs closely on chemo    CKD (chronic kidney disease) stage 3, GFR 30-59 ml/min (HCC) will monitor renal function closely during chemo    Hypercalcemia -Probably related to her underlying  malignancy -She previously received Aredia 2 doses -Calcium level has come down significantly since she started chemotherapy.     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  Underlying hepatic cirrhosis suspected.   2.5 cm indeterminate central hypoechoic hepatic lesion. See above comment.   02/22/2021 Imaging   CLINICAL DATA:  Right-sided abdominal pain.   EXAM: ABDOMEN ULTRASOUND COMPLETE  IMPRESSION: 1. Cirrhosis and small ascites. 2. Gallbladder sludge. No sonographic evidence of acute cholecystitis.   02/22/2021 Imaging   CLINICAL DATA:  Abdominal pain.  Concern for diverticulitis.   EXAM: CT ABDOMEN AND PELVIS WITH CONTRAST  IMPRESSION: 1. Acute pancreatitis. No abscess or pseudocyst. 2. Cirrhosis with small ascites. 3. Colonic diverticulosis. No bowel obstruction. Normal appendix. 4. Aortic Atherosclerosis (ICD10-I70.0).   03/02/2021 Tumor Marker   CA 19-9 = 45 (^) AFP = 11 (^)  03/03/2021 Imaging   CLINICAL DATA:  Pancreatitis, worsening abdominal pain.   EXAM: CT ABDOMEN AND PELVIS WITH CONTRAST  IMPRESSION: 1. Multiple suspicious lesions in the right hepatic lobe. Patient has underlying cirrhosis  and findings are concerning for multifocal hepatocellular carcinoma. Metastatic disease is also in the differential diagnosis. Recommend MRI of the abdomen with and without contrast. If the patient cannot tolerate a MRI, consider a multiphase liver CT. 2. Increased inflammatory changes in the upper abdomen compatible with pancreatitis and a large developing pseudocyst. This is developing pseudocyst measures up to 14 cm in size. 3. Cirrhosis with evidence of portal hypertension demonstrated by esophageal varices and varices draining into the IVC. 4. Indeterminate 3 mm pulmonary nodule in the lingula. Management of this pulmonary nodule depends on the etiology of the liver lesions.   03/04/2021 Imaging   EXAM: MRI ABDOMEN WITHOUT AND WITH CONTRAST (INCLUDING MRCP)  IMPRESSION: 1. Multiple enhancing lesions are noted throughout the right lobe of the liver, as detailed above. These have indeterminate imaging characteristics. Given the background of hepatic cirrhosis, the possibility of multifocal infiltrative hepatocellular carcinoma should be considered, although none of these lesions meet definite imaging criteria to establish that diagnosis. Correlation with AFP level is recommended. Other differential considerations include malignant and benign etiologies such as fibrolamellar hepatocellular carcinoma and multifocal focal nodular hyperplasia ([place]). Consideration for repeat abdominal MRI with and without IV gadolinium (Eovist) is also suggested, although biopsy may ultimately be required to establish a tissue diagnosis. 2. 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 3. Trace volume of perihepatic ascites.   09/22/2021 Tumor Marker   CA 19-9 = 190 (^) AFP = 124 (^)   01/19/2022 Tumor Marker   CA 19-9 = 816 (^) AFP = 307 (^)   02/06/2022 Imaging   EXAM: MRI ABDOMEN WITHOUT AND WITH CONTRAST (INCLUDING MRCP)  IMPRESSION: 1.  Morphologic features of the liver compatible with cirrhosis. 2. Extensive enhancing liver lesions are identified throughout both lobes of liver which demonstrate marked progression compared with the previous exam. Enhancing lesions demonstrate washout on the delayed images. In the setting of cirrhosis with progressively elevated alpha fetoprotein levels imaging findings are highly worrisome for multifocal hepatocellular carcinoma. 3. Enlarged upper abdominal lymph nodes identified. Cannot exclude underlying nodal metastasis. 4. There are several lesions within the central mesentery in the distribution of previous large pseudo cysts. The largest measures 5.0 x 3.1 cm. This is favored to represent sequelae of prior hemorrhagic pancreatitis with pseudocyst formation.   02/23/2022 Initial Diagnosis   Hepatocellular carcinoma ([place])   03/20/2022 Cancer Staging   Staging form: Liver, AJCC 8th Edition - Clinical stage from 03/20/2022: Stage IVA (cT3, cN1, cM0) - Signed by Truitt Merle, MD on 03/20/2022   03/21/2022 - 04/20/2022 Chemotherapy   Patient is on Treatment Plan : LUNG NSCLC Durvalumab (1500) q28d     04/04/2022 Pathology Results   A. LIVER, RIGHT LOBE, BIOPSY: Well to moderately differentiated CK7 positive adenocarcinoma (see comment)  COMMENT:  Given the cytohistomorphology and this immunohistochemical pattern of cytokeratin 7 positivity, the differential diagnosis would include a pancreaticobiliary or upper gastrointestinal intestinal tract primary or possibly a lung primary (not favored).    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 Pt states she has some nausea, but denies having pain. Pt state that her feet are swelling.   All other systems were reviewed with the patient and are negative.  MEDICAL HISTORY:  Past Medical History:  Diagnosis Date   Alcohol abuse    Alcoholic cirrhosis of liver (HCC)    Atrial fibrillation with RVR ([place]) 08/02/2019   Cancer (HCC)    Cataract    Chronic kidney disease    Helicobacter pylori gastritis 08/07/2019   Hepatitis C antibody positive in blood - negative RNA    Hypertension    Pancreatic pseudocyst 03/04/2021    SURGICAL HISTORY: Past Surgical History:  Procedure Laterality Date   BIOPSY  08/03/2019   Procedure: BIOPSY;  Surgeon: Gatha Mayer, MD;  Location: [facility] ENDOSCOPY;  Service: Endoscopy;;   ESOPHAGOGASTRODUODENOSCOPY (EGD) WITH PROPOFOL N/A 08/03/2019   Procedure: ESOPHAGOGASTRODUODENOSCOPY (EGD) WITH PROPOFOL;  Surgeon: Gatha Mayer, MD;  Location: [facility] ENDOSCOPY;  Service: Endoscopy;  Laterality: N/A;   IR IMAGING GUIDED PORT INSERTION  05/29/2022   IR RADIOLOGIST EVAL & MGMT  02/14/2022   IR RADIOLOGIST EVAL & MGMT  03/14/2022    I have reviewed the social history and family history with the patient and they are unchanged from previous note.  ALLERGIES:  has No Known Allergies.  MEDICATIONS:  Current Outpatient Medications  Medication Sig Dispense Refill   acetaminophen (TYLENOL) 500 MG tablet Take 1,000 mg by mouth every 6 (six) hours as needed for mild pain.     amLODipine (NORVASC) 5 MG tablet Take 1 tablet by mouth daily.     diclofenac Sodium (VOLTAREN) 1 % GEL Apply 2 g topically 2 (two) times daily as needed (pain). (Patient not taking: Reported on 04/28/2022)     gabapentin (NEURONTIN) 100 MG capsule Take 1 capsule (100 mg total) by mouth 3 (three) times daily. 90 capsule  2   latanoprost (XALATAN) 0.005 % ophthalmic solution Place 1 drop into both eyes at bedtime.     lidocaine-prilocaine (EMLA) cream Apply 1 Application topically as needed. Apply 85m to Port-a-Cath site 45 mins to 1 hr before port-a-cath access. 30 g 0   lipase/protease/amylase (CREON) 12000-38000 units CPEP capsule Take 1 capsule (12,000 Units total) by mouth 3 (three) times daily with meals. 270 capsule 2   ondansetron (ZOFRAN) 8 MG tablet Take 1 tablet (8 mg total) by mouth every 8 (eight) hours as needed for nausea or vomiting. 20 tablet 2   pantoprazole (PROTONIX) 40 MG tablet Take 1 tablet (40 mg total) by mouth daily. 90 tablet 1   polyethylene glycol (MIRALAX / GLYCOLAX) 17 g packet Take 17 g by mouth 2 (two) times daily. 14 each 0   prochlorperazine (COMPAZINE) 10 MG tablet Take 1 tablet (10 mg total) by mouth every 6 (six) hours as needed for nausea or vomiting. 30 tablet 2   No current facility-administered medications for this visit.   Facility-Administered Medications Ordered in Other Visits  Medication Dose Route Frequency Provider Last Rate Last Admin   sodium chloride flush (NS) 0.9 % injection 10 mL  10 mL Intracatheter  PRN Truitt Merle, MD   10 mL at 06/14/22 1728    PHYSICAL EXAMINATION: ECOG PERFORMANCE STATUS: 1 - Symptomatic but completely ambulatory  Vitals:   06/14/22 1144  BP: (!) 145/66  Pulse: 84  Resp: 16  Temp: (!) 97.4 F (36.3 C)  SpO2: 98%   Wt Readings from Last 3 Encounters:  06/14/22 228 lb 11.2 oz (103.7 kg)  05/31/22 227 lb 4.8 oz (103.1 kg)  05/29/22 227 lb 1.2 oz (103 kg)     GENERAL:alert, no distress and comfortable SKIN: skin color normal, no rashes or significant lesions EYES: normal, Conjunctiva are pink and non-injected, sclera clear  NEURO: alert & oriented x 3 with fluent speech  LABORATORY DATA:  I have reviewed the data as listed    Latest Ref Rng & Units 06/14/2022   10:37 AM 05/31/2022    7:54 AM 05/19/2022    8:13 AM  CBC   WBC 4.0 - 10.5 K/uL 10.3  9.6  7.1   Hemoglobin 12.0 - 15.0 g/dL 12.2  12.6  13.3   Hematocrit 36.0 - 46.0 % 35.7  36.9  38.9   Platelets 150 - 400 K/uL 192  374  106         Latest Ref Rng & Units 06/14/2022   10:37 AM 05/31/2022    7:54 AM 05/19/2022    8:13 AM  CMP  Glucose 70 - 99 mg/dL 124  91  86   BUN 8 - 23 mg/dL '11  17  14   '$ Creatinine 0.44 - 1.00 mg/dL 1.52  1.71  1.51   Sodium 135 - 145 mmol/L 137  139  136   Potassium 3.5 - 5.1 mmol/L 4.3  4.0  4.3   Chloride 98 - 111 mmol/L 108  107  107   CO2 22 - 32 mmol/L '25  25  23   '$ Calcium 8.9 - 10.3 mg/dL 10.0  11.6  11.6   Total Protein 6.5 - 8.1 g/dL 7.4  7.4  7.3   Total Bilirubin 0.3 - 1.2 mg/dL 0.6  0.5  0.4   Alkaline Phos 38 - 126 U/L 158  136  146   AST 15 - 41 U/L 190  136  176   ALT 0 - 44 U/L 112  84  122       RADIOGRAPHIC STUDIES: I have personally reviewed the radiological images as listed and agreed with the findings in the report. No results found.    Orders Placed This Encounter  Procedures   CT CHEST ABDOMEN PELVIS WO CONTRAST    Standing Status:   Future    Standing Expiration Date:   06/15/2023    Order Specific Question:   Preferred imaging location?    Answer:   [facility]    Order Specific Question:   Is Oral Contrast requested for this exam?    Answer:   Yes, Per Radiology protocol    Order Specific Question:   Does the patient have a contrast media/X-ray dye allergy?    Answer:   No   All questions were answered. The patient knows to call the clinic with any problems, questions or concerns. No barriers to learning was detected. The total time spent in the appointment was 30 minutes.     Truitt Merle, MD 06/14/2022   Felicity Coyer, CMA, am acting as scribe for Truitt Merle, MD.   I have reviewed the above documentation for accuracy and completeness, and I agree with  the above.

## 2022-06-13 NOTE — Assessment & Plan Note (Signed)
-  f/u with GI, will watch her LFTs closely on chemo

## 2022-06-14 ENCOUNTER — Encounter: Payer: Self-pay | Admitting: Hematology

## 2022-06-14 ENCOUNTER — Inpatient Hospital Stay: Payer: 59 | Attending: Hematology

## 2022-06-14 ENCOUNTER — Inpatient Hospital Stay: Payer: 59

## 2022-06-14 ENCOUNTER — Encounter: Payer: Self-pay | Admitting: General Practice

## 2022-06-14 ENCOUNTER — Inpatient Hospital Stay (HOSPITAL_BASED_OUTPATIENT_CLINIC_OR_DEPARTMENT_OTHER): Payer: 59 | Admitting: Hematology

## 2022-06-14 VITALS — BP 145/66 | HR 84 | Temp 97.4°F | Resp 16 | Wt 228.7 lb

## 2022-06-14 VITALS — BP 164/82 | HR 99 | Resp 16

## 2022-06-14 DIAGNOSIS — K5730 Diverticulosis of large intestine without perforation or abscess without bleeding: Secondary | ICD-10-CM | POA: Diagnosis not present

## 2022-06-14 DIAGNOSIS — I4891 Unspecified atrial fibrillation: Secondary | ICD-10-CM | POA: Diagnosis not present

## 2022-06-14 DIAGNOSIS — Z79899 Other long term (current) drug therapy: Secondary | ICD-10-CM | POA: Diagnosis not present

## 2022-06-14 DIAGNOSIS — I8510 Secondary esophageal varices without bleeding: Secondary | ICD-10-CM | POA: Diagnosis not present

## 2022-06-14 DIAGNOSIS — Z791 Long term (current) use of non-steroidal anti-inflammatories (NSAID): Secondary | ICD-10-CM | POA: Insufficient documentation

## 2022-06-14 DIAGNOSIS — N183 Chronic kidney disease, stage 3 unspecified: Secondary | ICD-10-CM | POA: Diagnosis not present

## 2022-06-14 DIAGNOSIS — C221 Intrahepatic bile duct carcinoma: Secondary | ICD-10-CM

## 2022-06-14 DIAGNOSIS — Z5111 Encounter for antineoplastic chemotherapy: Secondary | ICD-10-CM | POA: Insufficient documentation

## 2022-06-14 DIAGNOSIS — K859 Acute pancreatitis without necrosis or infection, unspecified: Secondary | ICD-10-CM | POA: Insufficient documentation

## 2022-06-14 DIAGNOSIS — I129 Hypertensive chronic kidney disease with stage 1 through stage 4 chronic kidney disease, or unspecified chronic kidney disease: Secondary | ICD-10-CM | POA: Insufficient documentation

## 2022-06-14 DIAGNOSIS — C220 Liver cell carcinoma: Secondary | ICD-10-CM

## 2022-06-14 DIAGNOSIS — I700 Atherosclerosis of aorta: Secondary | ICD-10-CM | POA: Diagnosis not present

## 2022-06-14 DIAGNOSIS — Z5112 Encounter for antineoplastic immunotherapy: Secondary | ICD-10-CM | POA: Insufficient documentation

## 2022-06-14 DIAGNOSIS — C787 Secondary malignant neoplasm of liver and intrahepatic bile duct: Secondary | ICD-10-CM | POA: Diagnosis not present

## 2022-06-14 DIAGNOSIS — N1831 Chronic kidney disease, stage 3a: Secondary | ICD-10-CM

## 2022-06-14 DIAGNOSIS — K863 Pseudocyst of pancreas: Secondary | ICD-10-CM | POA: Insufficient documentation

## 2022-06-14 DIAGNOSIS — K7031 Alcoholic cirrhosis of liver with ascites: Secondary | ICD-10-CM | POA: Insufficient documentation

## 2022-06-14 DIAGNOSIS — Z95828 Presence of other vascular implants and grafts: Secondary | ICD-10-CM

## 2022-06-14 DIAGNOSIS — M7989 Other specified soft tissue disorders: Secondary | ICD-10-CM | POA: Insufficient documentation

## 2022-06-14 LAB — CMP (CANCER CENTER ONLY)
ALT: 112 U/L — ABNORMAL HIGH (ref 0–44)
AST: 190 U/L (ref 15–41)
Albumin: 3.4 g/dL — ABNORMAL LOW (ref 3.5–5.0)
Alkaline Phosphatase: 158 U/L — ABNORMAL HIGH (ref 38–126)
Anion gap: 4 — ABNORMAL LOW (ref 5–15)
BUN: 11 mg/dL (ref 8–23)
CO2: 25 mmol/L (ref 22–32)
Calcium: 10 mg/dL (ref 8.9–10.3)
Chloride: 108 mmol/L (ref 98–111)
Creatinine: 1.52 mg/dL — ABNORMAL HIGH (ref 0.44–1.00)
GFR, Estimated: 38 mL/min — ABNORMAL LOW (ref >60)
Glucose, Bld: 124 mg/dL — ABNORMAL HIGH (ref 70–99)
Potassium: 4.3 mmol/L (ref 3.5–5.1)
Sodium: 137 mmol/L (ref 135–145)
Total Bilirubin: 0.6 mg/dL (ref 0.3–1.2)
Total Protein: 7.4 g/dL (ref 6.5–8.1)

## 2022-06-14 LAB — CBC WITH DIFFERENTIAL (CANCER CENTER ONLY)
Abs Immature Granulocytes: 0.07 10*3/uL (ref 0.00–0.07)
Basophils Absolute: 0.1 10*3/uL (ref 0.0–0.1)
Basophils Relative: 1 %
Eosinophils Absolute: 0.4 10*3/uL (ref 0.0–0.5)
Eosinophils Relative: 4 %
HCT: 35.7 % — ABNORMAL LOW (ref 36.0–46.0)
Hemoglobin: 12.2 g/dL (ref 12.0–15.0)
Immature Granulocytes: 1 %
Lymphocytes Relative: 32 %
Lymphs Abs: 3.3 10*3/uL (ref 0.7–4.0)
MCH: 33.8 pg (ref 26.0–34.0)
MCHC: 34.2 g/dL (ref 30.0–36.0)
MCV: 98.9 fL (ref 80.0–100.0)
Monocytes Absolute: 1.3 10*3/uL — ABNORMAL HIGH (ref 0.1–1.0)
Monocytes Relative: 12 %
Neutro Abs: 5.2 10*3/uL (ref 1.7–7.7)
Neutrophils Relative %: 50 %
Platelet Count: 192 10*3/uL (ref 150–400)
RBC: 3.61 MIL/uL — ABNORMAL LOW (ref 3.87–5.11)
RDW: 16.9 % — ABNORMAL HIGH (ref 11.5–15.5)
WBC Count: 10.3 10*3/uL (ref 4.0–10.5)
nRBC: 0 % (ref 0.0–0.2)

## 2022-06-14 MED ORDER — OXALIPLATIN CHEMO INJECTION 100 MG/20ML
85.0000 mg/m2 | Freq: Once | INTRAVENOUS | Status: AC
  Administered 2022-06-14: 200 mg via INTRAVENOUS
  Filled 2022-06-14: qty 8

## 2022-06-14 MED ORDER — SODIUM CHLORIDE 0.9 % IV SOLN
1500.0000 mg | Freq: Once | INTRAVENOUS | Status: AC
  Administered 2022-06-14: 1500 mg via INTRAVENOUS
  Filled 2022-06-14: qty 30

## 2022-06-14 MED ORDER — SODIUM CHLORIDE 0.9% FLUSH
10.0000 mL | INTRAVENOUS | Status: DC | PRN
  Administered 2022-06-14: 10 mL

## 2022-06-14 MED ORDER — SODIUM CHLORIDE 0.9 % IV SOLN
1000.0000 mg/m2 | Freq: Once | INTRAVENOUS | Status: AC
  Administered 2022-06-14: 2205 mg via INTRAVENOUS
  Filled 2022-06-14: qty 57.99

## 2022-06-14 MED ORDER — SODIUM CHLORIDE 0.9% FLUSH
10.0000 mL | Freq: Once | INTRAVENOUS | Status: AC | PRN
  Administered 2022-06-14: 10 mL

## 2022-06-14 MED ORDER — DEXTROSE 5 % IV SOLN: Freq: Once | INTRAVENOUS | Status: AC

## 2022-06-14 MED ORDER — SODIUM CHLORIDE 0.9 % IV SOLN
10.0000 mg | Freq: Once | INTRAVENOUS | Status: AC
  Administered 2022-06-14: 10 mg via INTRAVENOUS
  Filled 2022-06-14: qty 10

## 2022-06-14 MED ORDER — HEPARIN SOD (PORK) LOCK FLUSH 100 UNIT/ML IV SOLN
500.0000 [IU] | Freq: Once | INTRAVENOUS | Status: AC | PRN
  Administered 2022-06-14: 500 [IU]

## 2022-06-14 MED ORDER — SODIUM CHLORIDE 0.9 % IV SOLN
16.0000 mg | Freq: Once | INTRAVENOUS | Status: AC
  Administered 2022-06-14: 16 mg via INTRAVENOUS
  Filled 2022-06-14: qty 8

## 2022-06-14 NOTE — Patient Instructions (Signed)

## 2022-06-14 NOTE — Patient Instructions (Signed)
[facility]  Discharge Instructions: Thank you for choosing [facility] to provide your oncology and hematology care.   If you have a lab appointment with the [facility], please go directly to the [facility] and check in at the registration area.   Wear comfortable clothing and clothing appropriate for easy access to any Portacath or PICC line.   We strive to give you quality time with your provider. You may need to reschedule your appointment if you arrive late (15 or more minutes).  Arriving late affects you and other patients whose appointments are after yours.  Also, if you miss three or more appointments without notifying the office, you may be dismissed from the clinic at the provider's discretion.      For prescription refill requests, have your pharmacy contact our office and allow 72 hours for refills to be completed.    Today you received the following chemotherapy and/or immunotherapy agents: Imfinzi/Gemzar/Oxaliplatin   To help prevent nausea and vomiting after your treatment, we encourage you to take your nausea medication as directed.  BELOW ARE SYMPTOMS THAT SHOULD BE REPORTED IMMEDIATELY: *FEVER GREATER THAN 100.4 F (38 C) OR HIGHER *CHILLS OR SWEATING *NAUSEA AND VOMITING THAT IS NOT CONTROLLED WITH YOUR NAUSEA MEDICATION *UNUSUAL SHORTNESS OF BREATH *UNUSUAL BRUISING OR BLEEDING *URINARY PROBLEMS (pain or burning when urinating, or frequent urination) *BOWEL PROBLEMS (unusual diarrhea, constipation, pain near the anus) TENDERNESS IN MOUTH AND THROAT WITH OR WITHOUT PRESENCE OF ULCERS (sore throat, sores in mouth, or a toothache) UNUSUAL RASH, SWELLING OR PAIN  UNUSUAL VAGINAL DISCHARGE OR ITCHING   Items with * indicate a potential emergency and should be followed up as soon as possible or go to the Emergency Department if any problems should occur.  Please show the CHEMOTHERAPY ALERT CARD or IMMUNOTHERAPY ALERT  CARD at check-in to the Emergency Department and triage nurse.  Should you have questions after your visit or need to cancel or reschedule your appointment, please contact [facility]  Dept: [phone]  and follow the prompts.  Office hours are 8:00 a.m. to 4:30 p.m. Monday - Friday. Please note that voicemails left after 4:00 p.m. may not be returned until the following business day.  We are closed weekends and major holidays. You have access to a nurse at all times for urgent questions. Please call the main number to the clinic Dept: [phone] and follow the prompts.   For any non-urgent questions, you may also contact your provider using MyChart. We now offer e-Visits for anyone 60 and older to request care online for non-urgent symptoms. For details visit [email or web address].   Also download the MyChart app! Go to the app store, search "MyChart", open the app, select [facility], and log in with your MyChart username and password.

## 2022-06-14 NOTE — Progress Notes (Signed)
[facility] Spiritual Care Note  Followed up with Ms [facility] in infusion. She was in good spirits, in part due to an encouraging kidney update. She values pastoral check-ins and hugs; visits have been an important part of her treatment routine. She knows to reach out by phone whenever needed/desired as well.   [place], [place], [facility] Pager [phone] Voicemail [phone]

## 2022-06-21 ENCOUNTER — Other Ambulatory Visit: Payer: Self-pay

## 2022-06-27 MED FILL — Dexamethasone Sodium Phosphate Inj 100 MG/10ML: INTRAMUSCULAR | Qty: 1 | Status: AC

## 2022-06-27 NOTE — Assessment & Plan Note (Signed)
-  f/u with GI, will watch her LFTs closely on chemo

## 2022-06-27 NOTE — Assessment & Plan Note (Signed)
-  Stage IVA (cT3, cN1, cM0)  with diffuse liver mets, unresectable  -Liver cirrhosis and a 2.5cm mass was found in 07/2019 but she did not follow up -due to rising AFP, she had MRI on 02/06/2022 which showed extensive liver lesions (largest 13cm) throughout both lobes with marked progression from prior MRI; enlarged upper abdominal lymph nodes, no distant mets -she started Durvaluamb every 4 weeks, and Tremelimumab 350m once on day 1 of cycle 1, on 03/21/2022.  -Although she had typical image findings of [place] she underwent liver biopsy since CA 19-9 was also elevated  -liver biopsy showed well to moderately differentiated CK7 positive adenocarcinoma and her EGD was negative for upper GI tract malignancy. Tumor cells were also positive for Glycipan 3, supporting mixed HCC and cholagiocarcinoma. -she started GEMOX (cisplatin is contraindicated due to her chronic kidney disease) and continued durvalumab on 05/10/22, she has been tolerating treatment well overall  -restaging CT scan was done this morning

## 2022-06-27 NOTE — Assessment & Plan Note (Signed)
-  will monitor renal function closely during chemo

## 2022-06-27 NOTE — Assessment & Plan Note (Signed)
-  Probably related to her underlying malignancy -She previously received Aredia 2 doses -Calcium level has come down significantly since she started chemotherapy.

## 2022-06-28 ENCOUNTER — Other Ambulatory Visit: Payer: Self-pay

## 2022-06-28 ENCOUNTER — Inpatient Hospital Stay (HOSPITAL_BASED_OUTPATIENT_CLINIC_OR_DEPARTMENT_OTHER): Payer: 59 | Admitting: Hematology

## 2022-06-28 ENCOUNTER — Inpatient Hospital Stay: Payer: 59

## 2022-06-28 ENCOUNTER — Ambulatory Visit (HOSPITAL_COMMUNITY)
Admission: RE | Admit: 2022-06-28 | Discharge: 2022-06-28 | Disposition: A | Discharge status: Home / Self Care | Payer: 59 | Source: Ambulatory Visit | Attending: Hematology | Admitting: Hematology

## 2022-06-28 ENCOUNTER — Encounter: Payer: Self-pay | Admitting: Hematology

## 2022-06-28 ENCOUNTER — Encounter (HOSPITAL_COMMUNITY): Payer: Self-pay

## 2022-06-28 VITALS — BP 151/80 | HR 73 | Temp 97.7°F | Resp 16

## 2022-06-28 DIAGNOSIS — C220 Liver cell carcinoma: Secondary | ICD-10-CM

## 2022-06-28 DIAGNOSIS — C221 Intrahepatic bile duct carcinoma: Secondary | ICD-10-CM

## 2022-06-28 DIAGNOSIS — K7031 Alcoholic cirrhosis of liver with ascites: Secondary | ICD-10-CM | POA: Diagnosis not present

## 2022-06-28 DIAGNOSIS — C801 Malignant (primary) neoplasm, unspecified: Secondary | ICD-10-CM | POA: Diagnosis present

## 2022-06-28 DIAGNOSIS — N1831 Chronic kidney disease, stage 3a: Secondary | ICD-10-CM

## 2022-06-28 DIAGNOSIS — Z95828 Presence of other vascular implants and grafts: Secondary | ICD-10-CM

## 2022-06-28 LAB — COMPREHENSIVE METABOLIC PANEL
ALT: 55 U/L — ABNORMAL HIGH (ref 0–44)
AST: 119 U/L — ABNORMAL HIGH (ref 15–41)
Albumin: 3.4 g/dL — ABNORMAL LOW (ref 3.5–5.0)
Alkaline Phosphatase: 136 U/L — ABNORMAL HIGH (ref 38–126)
Anion gap: 6 (ref 5–15)
BUN: 9 mg/dL (ref 8–23)
CO2: 25 mmol/L (ref 22–32)
Calcium: 9.7 mg/dL (ref 8.9–10.3)
Chloride: 106 mmol/L (ref 98–111)
Creatinine, Ser: 0.97 mg/dL (ref 0.44–1.00)
GFR, Estimated: >60 mL/min (ref >60)
Glucose, Bld: 83 mg/dL (ref 70–99)
Potassium: 4 mmol/L (ref 3.5–5.1)
Sodium: 137 mmol/L (ref 135–145)
Total Bilirubin: 0.7 mg/dL (ref 0.3–1.2)
Total Protein: 7 g/dL (ref 6.5–8.1)

## 2022-06-28 LAB — CBC WITH DIFFERENTIAL/PLATELET
Abs Immature Granulocytes: 0.02 10*3/uL (ref 0.00–0.07)
Basophils Absolute: 0 10*3/uL (ref 0.0–0.1)
Basophils Relative: 1 %
Eosinophils Absolute: 0.3 10*3/uL (ref 0.0–0.5)
Eosinophils Relative: 3 %
HCT: 34.3 % — ABNORMAL LOW (ref 36.0–46.0)
Hemoglobin: 11.9 g/dL — ABNORMAL LOW (ref 12.0–15.0)
Immature Granulocytes: 0 %
Lymphocytes Relative: 38 %
Lymphs Abs: 3.1 10*3/uL (ref 0.7–4.0)
MCH: 34.1 pg — ABNORMAL HIGH (ref 26.0–34.0)
MCHC: 34.7 g/dL (ref 30.0–36.0)
MCV: 98.3 fL (ref 80.0–100.0)
Monocytes Absolute: 1.4 10*3/uL — ABNORMAL HIGH (ref 0.1–1.0)
Monocytes Relative: 17 %
Neutro Abs: 3.4 10*3/uL (ref 1.7–7.7)
Neutrophils Relative %: 41 %
Platelets: 114 10*3/uL — ABNORMAL LOW (ref 150–400)
RBC: 3.49 MIL/uL — ABNORMAL LOW (ref 3.87–5.11)
RDW: 17.8 % — ABNORMAL HIGH (ref 11.5–15.5)
WBC: 8.2 10*3/uL (ref 4.0–10.5)
nRBC: 0 % (ref 0.0–0.2)

## 2022-06-28 LAB — TSH: TSH: 3.346 u[IU]/mL (ref 0.350–4.500)

## 2022-06-28 MED ORDER — IOHEXOL 9 MG/ML PO SOLN
500.0000 mL | ORAL | Status: AC
  Administered 2022-06-28 (×2): 500 mL via ORAL

## 2022-06-28 MED ORDER — SODIUM CHLORIDE 0.9 % IV SOLN
10.0000 mg | Freq: Once | INTRAVENOUS | Status: AC
  Administered 2022-06-28: 10 mg via INTRAVENOUS
  Filled 2022-06-28: qty 10

## 2022-06-28 MED ORDER — DEXTROSE 5 % IV SOLN: Freq: Once | INTRAVENOUS | Status: AC

## 2022-06-28 MED ORDER — SODIUM CHLORIDE 0.9% FLUSH
10.0000 mL | INTRAVENOUS | Status: DC | PRN
  Administered 2022-06-28: 10 mL

## 2022-06-28 MED ORDER — HEPARIN SOD (PORK) LOCK FLUSH 100 UNIT/ML IV SOLN
500.0000 [IU] | Freq: Once | INTRAVENOUS | Status: AC | PRN
  Administered 2022-06-28: 500 [IU]

## 2022-06-28 MED ORDER — SODIUM CHLORIDE 0.9 % IV SOLN
1000.0000 mg/m2 | Freq: Once | INTRAVENOUS | Status: AC
  Administered 2022-06-28: 2205 mg via INTRAVENOUS
  Filled 2022-06-28: qty 57.99

## 2022-06-28 MED ORDER — IOHEXOL 9 MG/ML PO SOLN
ORAL | Status: AC
  Filled 2022-06-28: qty 1000

## 2022-06-28 MED ORDER — SODIUM CHLORIDE 0.9 % IV SOLN
16.0000 mg | Freq: Once | INTRAVENOUS | Status: AC
  Administered 2022-06-28: 16 mg via INTRAVENOUS
  Filled 2022-06-28: qty 8

## 2022-06-28 MED ORDER — OXALIPLATIN CHEMO INJECTION 100 MG/20ML
85.0000 mg/m2 | Freq: Once | INTRAVENOUS | Status: AC
  Administered 2022-06-28: 200 mg via INTRAVENOUS
  Filled 2022-06-28: qty 40

## 2022-06-28 MED ORDER — SODIUM CHLORIDE 0.9% FLUSH
10.0000 mL | Freq: Once | INTRAVENOUS | Status: AC
  Administered 2022-06-28: 10 mL

## 2022-06-28 NOTE — Progress Notes (Signed)
Per Dr. Burr Medico, [place] to treat with CMP not yet resulted.

## 2022-06-28 NOTE — Patient Instructions (Signed)
[facility]  Discharge Instructions: Thank you for choosing [facility] to provide your oncology and hematology care.   If you have a lab appointment with the [facility], please go directly to the [facility] and check in at the registration area.   Wear comfortable clothing and clothing appropriate for easy access to any Portacath or PICC line.   We strive to give you quality time with your provider. You may need to reschedule your appointment if you arrive late (15 or more minutes).  Arriving late affects you and other patients whose appointments are after yours.  Also, if you miss three or more appointments without notifying the office, you may be dismissed from the clinic at the provider's discretion.      For prescription refill requests, have your pharmacy contact our office and allow 72 hours for refills to be completed.    Today you received the following chemotherapy and/or immunotherapy agents: oxaliplatin and gemcitabine      To help prevent nausea and vomiting after your treatment, we encourage you to take your nausea medication as directed.  BELOW ARE SYMPTOMS THAT SHOULD BE REPORTED IMMEDIATELY: *FEVER GREATER THAN 100.4 F (38 C) OR HIGHER *CHILLS OR SWEATING *NAUSEA AND VOMITING THAT IS NOT CONTROLLED WITH YOUR NAUSEA MEDICATION *UNUSUAL SHORTNESS OF BREATH *UNUSUAL BRUISING OR BLEEDING *URINARY PROBLEMS (pain or burning when urinating, or frequent urination) *BOWEL PROBLEMS (unusual diarrhea, constipation, pain near the anus) TENDERNESS IN MOUTH AND THROAT WITH OR WITHOUT PRESENCE OF ULCERS (sore throat, sores in mouth, or a toothache) UNUSUAL RASH, SWELLING OR PAIN  UNUSUAL VAGINAL DISCHARGE OR ITCHING   Items with * indicate a potential emergency and should be followed up as soon as possible or go to the Emergency Department if any problems should occur.  Please show the CHEMOTHERAPY ALERT CARD or IMMUNOTHERAPY  ALERT CARD at check-in to the Emergency Department and triage nurse.  Should you have questions after your visit or need to cancel or reschedule your appointment, please contact [facility]  Dept: [phone]  and follow the prompts.  Office hours are 8:00 a.m. to 4:30 p.m. Monday - Friday. Please note that voicemails left after 4:00 p.m. may not be returned until the following business day.  We are closed weekends and major holidays. You have access to a nurse at all times for urgent questions. Please call the main number to the clinic Dept: [phone] and follow the prompts.   For any non-urgent questions, you may also contact your provider using MyChart. We now offer e-Visits for anyone 14 and older to request care online for non-urgent symptoms. For details visit [email or web address].   Also download the MyChart app! Go to the app store, search "MyChart", open the app, select [facility], and log in with your MyChart username and password.

## 2022-06-28 NOTE — Progress Notes (Signed)
[place]   Telephone:([phone]) [phone] [phone]) [phone]   Clinic Follow up Note   Patient Care Team: Arthur Holms, NP as PCP - General (Nurse Practitioner) Sanda Klein, MD as PCP - Cardiology (Cardiology) Roosevelt Locks, [place] as Nurse Practitioner (Transplant Hepatology) Sandi Mariscal, MD as Consulting Physician (Interventional Radiology) Truitt Merle, MD as Consulting Physician (Hematology and Oncology)  Date of Service:  06/28/2022  CHIEF COMPLAINT: f/u of mixed liver and bile duct cancer  CURRENT THERAPY:  GEMOX every 2 weeks, durvalumab every 4 weeks  ASSESSMENT:  Connie [name] is a 67 y.o. 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  -Although she had typical image findings of [place], she underwent liver biopsy since CA 19-9 was also elevated  -liver biopsy showed well to moderately differentiated CK7 positive adenocarcinoma and her EGD was negative for upper GI tract malignancy. Tumor cells were also positive for Glycipan 3, supporting mixed HCC and cholagiocarcinoma. -she started GEMOX (cisplatin is contraindicated due to her chronic kidney disease) and continued durvalumab on 05/10/22, she has been tolerating treatment well overall  -restaging CT scan was done this morning showed overall stable disease, the evaluation is limited to in liver without IV contrast due to her CKD.  The hepatoportal lymph node has decreased in size.  No other evidence of cancer progression.  I personally reviewed the  scan images and discussed the results with patient in detail today. -Plan to obtain abdominal MRI as next restaging scan in 3 months. -Will continue current therapy.  She is tolerating very well.  Hypercalcemia -Probably related to her underlying malignancy -She previously received Aredia 2 doses -Calcium level has come down significantly since she started chemotherapy.    CKD (chronic kidney disease) stage 3, GFR 30-59 ml/min (HCC) -will monitor renal function closely during chemo      Alcoholic cirrhosis of liver with ascites ([place]) -f/u with GI, will watch her LFTs closely on chemo       PLAN: -Lab and restaging CT scan reviewed, overall stable disease -Will proceed chemotherapy GEMOX at the same dose today -Follow-up in 2 weeks before next cycle chemo.   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  Underlying hepatic cirrhosis suspected.   2.5 cm indeterminate central hypoechoic hepatic lesion. See above comment.   02/22/2021 Imaging   CLINICAL DATA:  Right-sided abdominal pain.   EXAM: ABDOMEN ULTRASOUND COMPLETE  IMPRESSION: 1. Cirrhosis and small ascites. 2. Gallbladder sludge. No sonographic evidence of acute cholecystitis.   02/22/2021 Imaging   CLINICAL DATA:  Abdominal  pain.  Concern for diverticulitis.   EXAM: CT ABDOMEN AND PELVIS WITH CONTRAST  IMPRESSION: 1. Acute pancreatitis. No abscess or pseudocyst. 2. Cirrhosis with small ascites. 3. Colonic  diverticulosis. No bowel obstruction. Normal appendix. 4. Aortic Atherosclerosis (ICD10-I70.0).   03/02/2021 Tumor Marker   CA 19-9 = 45 (^) AFP = 11 (^)   03/03/2021 Imaging   CLINICAL DATA:  Pancreatitis, worsening abdominal pain.   EXAM: CT ABDOMEN AND PELVIS WITH CONTRAST  IMPRESSION: 1. Multiple suspicious lesions in the right hepatic lobe. Patient has underlying cirrhosis and findings are concerning for multifocal hepatocellular carcinoma. Metastatic disease is also in the differential diagnosis. Recommend MRI of the abdomen with and without contrast. If the patient cannot tolerate a MRI, consider a multiphase liver CT. 2. Increased inflammatory changes in the upper abdomen compatible with pancreatitis and a large developing pseudocyst. This is developing pseudocyst measures up to 14 cm in size. 3. Cirrhosis with evidence of portal hypertension demonstrated by esophageal varices and varices draining into the IVC. 4. Indeterminate 3 mm pulmonary nodule in the lingula. Management of this pulmonary nodule depends on the etiology of the liver lesions.   03/04/2021 Imaging   EXAM: MRI ABDOMEN WITHOUT AND WITH CONTRAST (INCLUDING MRCP)  IMPRESSION: 1. Multiple enhancing lesions are noted throughout the right lobe of the liver, as detailed above. These have indeterminate imaging characteristics. Given the background of hepatic cirrhosis, the possibility of multifocal infiltrative hepatocellular carcinoma should be considered, although none of these lesions meet definite imaging criteria to establish that diagnosis. Correlation with AFP level is recommended. Other differential considerations include malignant and benign etiologies such as fibrolamellar hepatocellular carcinoma and multifocal focal nodular hyperplasia ([place]). Consideration for repeat abdominal MRI with and without IV gadolinium (Eovist) is also suggested, although biopsy may ultimately be required to establish a  tissue diagnosis. 2. 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 3. Trace volume of perihepatic ascites.   09/22/2021 Tumor Marker   CA 19-9 = 190 (^) AFP = 124 (^)   01/19/2022 Tumor Marker   CA 19-9 = 816 (^) AFP = 307 (^)   02/06/2022 Imaging   EXAM: MRI ABDOMEN WITHOUT AND WITH CONTRAST (INCLUDING MRCP)  IMPRESSION: 1. Morphologic features of the liver compatible with cirrhosis. 2. Extensive enhancing liver lesions are identified throughout both lobes of liver which demonstrate marked progression compared with the previous exam. Enhancing lesions demonstrate washout on the delayed images. In the setting of cirrhosis with progressively elevated alpha fetoprotein levels imaging findings are highly worrisome for multifocal hepatocellular carcinoma. 3. Enlarged upper abdominal lymph nodes identified. Cannot exclude underlying nodal metastasis. 4. There are several lesions within the central mesentery in the distribution of previous large pseudo cysts. The largest measures 5.0 x 3.1 cm. This is favored to represent sequelae of prior hemorrhagic pancreatitis with pseudocyst formation.   02/23/2022 Initial Diagnosis   Hepatocellular carcinoma ([place])   03/20/2022 Cancer Staging   Staging form: Liver, AJCC 8th Edition - Clinical stage from 03/20/2022: Stage IVA (cT3, cN1, cM0) - Signed by Truitt Merle, MD on 03/20/2022   03/21/2022 - 04/20/2022 Chemotherapy   Patient is on Treatment Plan : LUNG NSCLC Durvalumab (1500) q28d     04/04/2022 Pathology Results   A. LIVER, RIGHT LOBE, BIOPSY: Well to moderately differentiated CK7 positive adenocarcinoma (see comment)  COMMENT:  Given the cytohistomorphology and this immunohistochemical pattern of cytokeratin 7 positivity, the differential diagnosis would include a pancreaticobiliary or upper gastrointestinal intestinal  tract primary or possibly a lung primary (not favored).     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 She was last seen by me on 06/14/2022. She presents to the clinic accompanied by her husband.  She had a restaging CT scan without contrast at [facility] radiology this morning.  She is clinically doing very well, denies any pain or other complaints.  She is tolerating chemotherapy well with mild fatigue after infusion, no other noticeable side effects.  She is eating well, appetite has been normal, weight is stable.  She denies any significant neuropathy.   All other systems were reviewed with the patient and are negative.  MEDICAL HISTORY:  Past Medical History:  Diagnosis Date   Alcohol abuse    Alcoholic cirrhosis of liver (HCC)    Atrial fibrillation with RVR ([place]) 08/02/2019   Cataract    Chronic kidney disease    Helicobacter pylori gastritis 08/07/2019   Hepatitis C antibody positive in blood - negative RNA    Hypertension    liver ca 01/2022   bile duct ca 05/2022   Pancreatic pseudocyst 03/04/2021    SURGICAL HISTORY: Past Surgical History:  Procedure Laterality Date   BIOPSY  08/03/2019   Procedure: BIOPSY;  Surgeon: Gatha Mayer, MD;  Location: [facility];  Service: Endoscopy;;   ESOPHAGOGASTRODUODENOSCOPY (EGD) WITH PROPOFOL N/A 08/03/2019   Procedure: ESOPHAGOGASTRODUODENOSCOPY (EGD) WITH PROPOFOL;  Surgeon: Gatha Mayer, MD;  Location: [facility] ENDOSCOPY;  Service: Endoscopy;  Laterality: N/A;   IR IMAGING GUIDED PORT INSERTION  05/29/2022   IR RADIOLOGIST EVAL  & MGMT  02/14/2022   IR RADIOLOGIST EVAL & MGMT  03/14/2022    I have reviewed the social history and family history with the patient and they are unchanged from previous note.  ALLERGIES:  has No Known Allergies.  MEDICATIONS:  Current Outpatient Medications  Medication Sig Dispense Refill   acetaminophen (TYLENOL) 500 MG tablet Take 1,000 mg by mouth every 6 (six) hours as needed for mild pain.     amLODipine (NORVASC) 5 MG tablet Take 1 tablet by mouth daily.     diclofenac Sodium (VOLTAREN) 1 % GEL Apply 2 g topically 2 (two) times daily as needed (pain). (Patient not taking: Reported on 04/28/2022)     gabapentin (NEURONTIN) 100 MG capsule Take 1 capsule (100 mg total) by mouth 3 (three) times daily. 90 capsule 2   latanoprost (XALATAN) 0.005 % ophthalmic solution Place 1 drop into both eyes at bedtime.     lidocaine-prilocaine (EMLA) cream Apply 1 Application topically as needed. Apply 60m to Port-a-Cath site 45 mins to 1 hr before port-a-cath access. 30 g 0   lipase/protease/amylase (CREON) 12000-38000 units CPEP capsule Take 1 capsule (12,000 Units total) by mouth 3 (three) times daily with meals. 270 capsule 2   ondansetron (ZOFRAN) 8 MG tablet Take 1 tablet (8 mg total) by mouth every 8 (eight) hours as needed for nausea or vomiting. 20 tablet 2  pantoprazole (PROTONIX) 40 MG tablet Take 1 tablet (40 mg total) by mouth daily. 90 tablet 1   polyethylene glycol (MIRALAX / GLYCOLAX) 17 g packet Take 17 g by mouth 2 (two) times daily. 14 each 0   prochlorperazine (COMPAZINE) 10 MG tablet Take 1 tablet (10 mg total) by mouth every 6 (six) hours as needed for nausea or vomiting. 30 tablet 2   No current facility-administered medications for this visit.   Facility-Administered Medications Ordered in Other Visits  Medication Dose Route Frequency Provider Last Rate Last Admin   iohexol (OMNIPAQUE) 9 MG/ML oral solution             PHYSICAL EXAMINATION: ECOG PERFORMANCE STATUS: 1 -  Symptomatic but completely ambulatory  There were no vitals filed for this visit. Wt Readings from Last 3 Encounters:  06/14/22 228 lb 11.2 oz (103.7 kg)  05/31/22 227 lb 4.8 oz (103.1 kg)  05/29/22 227 lb 1.2 oz (103 kg)     GENERAL:alert, no distress and comfortable SKIN: skin color, texture, turgor are normal, no rashes or significant lesions EYES: normal, Conjunctiva are pink and non-injected, sclera clear Musculoskeletal:no cyanosis of digits and no clubbing  NEURO: alert & oriented x 3 with fluent speech, no focal motor/sensory deficits  LABORATORY DATA:  I have reviewed the data as listed    Latest Ref Rng & Units 06/14/2022   10:37 AM 05/31/2022    7:54 AM 05/19/2022    8:13 AM  CBC  WBC 4.0 - 10.5 K/uL 10.3  9.6  7.1   Hemoglobin 12.0 - 15.0 g/dL 12.2  12.6  13.3   Hematocrit 36.0 - 46.0 % 35.7  36.9  38.9   Platelets 150 - 400 K/uL 192  374  106         Latest Ref Rng & Units 06/14/2022   10:37 AM 05/31/2022    7:54 AM 05/19/2022    8:13 AM  CMP  Glucose 70 - 99 mg/dL 124  91  86   BUN 8 - 23 mg/dL '11  17  14   '$ Creatinine 0.44 - 1.00 mg/dL 1.52  1.71  1.51   Sodium 135 - 145 mmol/L 137  139  136   Potassium 3.5 - 5.1 mmol/L 4.3  4.0  4.3   Chloride 98 - 111 mmol/L 108  107  107   CO2 22 - 32 mmol/L '25  25  23   '$ Calcium 8.9 - 10.3 mg/dL 10.0  11.6  11.6   Total Protein 6.5 - 8.1 g/dL 7.4  7.4  7.3   Total Bilirubin 0.3 - 1.2 mg/dL 0.6  0.5  0.4   Alkaline Phos 38 - 126 U/L 158  136  146   AST 15 - 41 U/L 190  136  176   ALT 0 - 44 U/L 112  84  122       RADIOGRAPHIC STUDIES: I have personally reviewed the radiological images as listed and agreed with the findings in the report. No results found.    No orders of the defined types were placed in this encounter.  All questions were answered. The patient knows to call the clinic with any problems, questions or concerns. No barriers to learning was detected. The total time spent in the appointment was 30  minutes.     Truitt Merle, MD 06/28/2022

## 2022-06-29 ENCOUNTER — Other Ambulatory Visit: Payer: Self-pay

## 2022-06-30 ENCOUNTER — Other Ambulatory Visit: Payer: Self-pay

## 2022-06-30 LAB — AFP TUMOR MARKER: AFP, Serum, Tumor Marker: 1521 ng/mL — ABNORMAL HIGH (ref 0.0–9.2)

## 2022-06-30 LAB — CANCER ANTIGEN 19-9: CA 19-9: 731 U/mL — ABNORMAL HIGH (ref 0–35)

## 2022-07-01 ENCOUNTER — Other Ambulatory Visit: Payer: Self-pay

## 2022-07-11 MED FILL — Dexamethasone Sodium Phosphate Inj 100 MG/10ML: INTRAMUSCULAR | Qty: 1 | Status: AC

## 2022-07-11 NOTE — Assessment & Plan Note (Signed)
-  will monitor renal function closely during chemo     

## 2022-07-11 NOTE — Assessment & Plan Note (Signed)
-  f/u with GI, will watch her LFTs closely on chemo     

## 2022-07-11 NOTE — Assessment & Plan Note (Signed)
-  Probably related to her underlying malignancy -She previously received Aredia 2 doses -Calcium level has come down significantly since she started chemotherapy. 

## 2022-07-11 NOTE — Assessment & Plan Note (Signed)
-  Stage IVA (cT3, cN1, cM0)  with diffuse liver mets, unresectable  -Liver cirrhosis and a 2.5cm mass was found in 07/2019 but she did not follow up -due to rising AFP, she had MRI on 02/06/2022 which showed extensive liver lesions (largest 13cm) throughout both lobes with marked progression from prior MRI; enlarged upper abdominal lymph nodes, no distant mets -she started Durvaluamb every 4 weeks, and Tremelimumab 300mg  once on day 1 of cycle 1, on 03/21/2022.  -Although she had typical image findings of [place], she underwent liver biopsy since CA 19-9 was also elevated  -liver biopsy showed well to moderately differentiated CK7 positive adenocarcinoma and her EGD was negative for upper GI tract malignancy. Tumor cells were also positive for Glycipan 3, supporting mixed HCC and cholagiocarcinoma. -she started GEMOX (cisplatin is contraindicated due to her chronic kidney disease) and continued durvalumab on 05/10/22, she has been tolerating treatment well overall  -restaging CT scan from 06/28/22 showed overall stable disease, will continue current therapy

## 2022-07-12 ENCOUNTER — Other Ambulatory Visit: Payer: Self-pay

## 2022-07-12 ENCOUNTER — Inpatient Hospital Stay: Payer: 59

## 2022-07-12 ENCOUNTER — Encounter: Payer: Self-pay | Admitting: Hematology

## 2022-07-12 ENCOUNTER — Inpatient Hospital Stay: Payer: 59 | Attending: Hematology | Admitting: Hematology

## 2022-07-12 VITALS — BP 108/68 | HR 58 | Temp 97.7°F | Resp 15 | Ht 65.0 in | Wt 229.6 lb

## 2022-07-12 DIAGNOSIS — N1831 Chronic kidney disease, stage 3a: Secondary | ICD-10-CM

## 2022-07-12 DIAGNOSIS — I129 Hypertensive chronic kidney disease with stage 1 through stage 4 chronic kidney disease, or unspecified chronic kidney disease: Secondary | ICD-10-CM | POA: Diagnosis not present

## 2022-07-12 DIAGNOSIS — N183 Chronic kidney disease, stage 3 unspecified: Secondary | ICD-10-CM | POA: Insufficient documentation

## 2022-07-12 DIAGNOSIS — D6959 Other secondary thrombocytopenia: Secondary | ICD-10-CM | POA: Diagnosis not present

## 2022-07-12 DIAGNOSIS — C221 Intrahepatic bile duct carcinoma: Secondary | ICD-10-CM

## 2022-07-12 DIAGNOSIS — Z5112 Encounter for antineoplastic immunotherapy: Secondary | ICD-10-CM | POA: Diagnosis not present

## 2022-07-12 DIAGNOSIS — C220 Liver cell carcinoma: Secondary | ICD-10-CM

## 2022-07-12 DIAGNOSIS — Z5111 Encounter for antineoplastic chemotherapy: Secondary | ICD-10-CM | POA: Insufficient documentation

## 2022-07-12 DIAGNOSIS — K7031 Alcoholic cirrhosis of liver with ascites: Secondary | ICD-10-CM | POA: Diagnosis not present

## 2022-07-12 DIAGNOSIS — Z95828 Presence of other vascular implants and grafts: Secondary | ICD-10-CM

## 2022-07-12 LAB — CBC WITH DIFFERENTIAL (CANCER CENTER ONLY)
Abs Immature Granulocytes: 0.03 10*3/uL (ref 0.00–0.07)
Basophils Absolute: 0 10*3/uL (ref 0.0–0.1)
Basophils Relative: 0 %
Eosinophils Absolute: 0.3 10*3/uL (ref 0.0–0.5)
Eosinophils Relative: 5 %
HCT: 32.5 % — ABNORMAL LOW (ref 36.0–46.0)
Hemoglobin: 11 g/dL — ABNORMAL LOW (ref 12.0–15.0)
Immature Granulocytes: 1 %
Lymphocytes Relative: 31 %
Lymphs Abs: 2 10*3/uL (ref 0.7–4.0)
MCH: 34.1 pg — ABNORMAL HIGH (ref 26.0–34.0)
MCHC: 33.8 g/dL (ref 30.0–36.0)
MCV: 100.6 fL — ABNORMAL HIGH (ref 80.0–100.0)
Monocytes Absolute: 1.1 10*3/uL — ABNORMAL HIGH (ref 0.1–1.0)
Monocytes Relative: 17 %
Neutro Abs: 3 10*3/uL (ref 1.7–7.7)
Neutrophils Relative %: 46 %
Platelet Count: 76 10*3/uL — ABNORMAL LOW (ref 150–400)
RBC: 3.23 MIL/uL — ABNORMAL LOW (ref 3.87–5.11)
RDW: 18.6 % — ABNORMAL HIGH (ref 11.5–15.5)
WBC Count: 6.3 10*3/uL (ref 4.0–10.5)
nRBC: 0 % (ref 0.0–0.2)

## 2022-07-12 LAB — CMP (CANCER CENTER ONLY)
ALT: 29 U/L (ref 0–44)
AST: 67 U/L — ABNORMAL HIGH (ref 15–41)
Albumin: 3.1 g/dL — ABNORMAL LOW (ref 3.5–5.0)
Alkaline Phosphatase: 116 U/L (ref 38–126)
Anion gap: 4 — ABNORMAL LOW (ref 5–15)
BUN: 7 mg/dL — ABNORMAL LOW (ref 8–23)
CO2: 27 mmol/L (ref 22–32)
Calcium: 10.2 mg/dL (ref 8.9–10.3)
Chloride: 109 mmol/L (ref 98–111)
Creatinine: 1.19 mg/dL — ABNORMAL HIGH (ref 0.44–1.00)
GFR, Estimated: 50 mL/min — ABNORMAL LOW (ref >60)
Glucose, Bld: 97 mg/dL (ref 70–99)
Potassium: 4.1 mmol/L (ref 3.5–5.1)
Sodium: 140 mmol/L (ref 135–145)
Total Bilirubin: 0.6 mg/dL (ref 0.3–1.2)
Total Protein: 6.9 g/dL (ref 6.5–8.1)

## 2022-07-12 MED ORDER — OXALIPLATIN CHEMO INJECTION 100 MG/20ML
85.0000 mg/m2 | Freq: Once | INTRAVENOUS | Status: AC
  Administered 2022-07-12: 200 mg via INTRAVENOUS
  Filled 2022-07-12: qty 40

## 2022-07-12 MED ORDER — SODIUM CHLORIDE 0.9 % IV SOLN
600.0000 mg/m2 | Freq: Once | INTRAVENOUS | Status: AC
  Administered 2022-07-12: 1330 mg via INTRAVENOUS
  Filled 2022-07-12: qty 34.98

## 2022-07-12 MED ORDER — HEPARIN SOD (PORK) LOCK FLUSH 100 UNIT/ML IV SOLN
500.0000 [IU] | Freq: Once | INTRAVENOUS | Status: AC | PRN
  Administered 2022-07-12: 500 [IU]

## 2022-07-12 MED ORDER — SODIUM CHLORIDE 0.9% FLUSH
10.0000 mL | INTRAVENOUS | Status: DC | PRN
  Administered 2022-07-12: 10 mL

## 2022-07-12 MED ORDER — DEXTROSE 5 % IV SOLN: Freq: Once | INTRAVENOUS | Status: AC

## 2022-07-12 MED ORDER — SODIUM CHLORIDE 0.9% FLUSH
10.0000 mL | Freq: Once | INTRAVENOUS | Status: AC
  Administered 2022-07-12: 10 mL

## 2022-07-12 MED ORDER — SODIUM CHLORIDE 0.9 % IV SOLN
1500.0000 mg | Freq: Once | INTRAVENOUS | Status: AC
  Administered 2022-07-12: 1500 mg via INTRAVENOUS
  Filled 2022-07-12: qty 30

## 2022-07-12 MED ORDER — SODIUM CHLORIDE 0.9 % IV SOLN
16.0000 mg | Freq: Once | INTRAVENOUS | Status: AC
  Administered 2022-07-12: 16 mg via INTRAVENOUS
  Filled 2022-07-12: qty 8

## 2022-07-12 MED ORDER — SODIUM CHLORIDE 0.9 % IV SOLN: Freq: Once | INTRAVENOUS | Status: AC

## 2022-07-12 MED ORDER — ONDANSETRON HCL 8 MG PO TABS: 8.0000 mg | ORAL_TABLET | Freq: Three times a day (TID) | ORAL | 2 refills | Status: DC | PRN

## 2022-07-12 MED ORDER — SODIUM CHLORIDE 0.9 % IV SOLN
10.0000 mg | Freq: Once | INTRAVENOUS | Status: AC
  Administered 2022-07-12: 10 mg via INTRAVENOUS
  Filled 2022-07-12: qty 10

## 2022-07-12 NOTE — Progress Notes (Signed)
Patient seen by MD today  Vitals are within treatment parameters.  Labs reviewed: and are not all within treatment parameters.    Platelet count 76 ok to treat  Per physician team, patient is ready for treatment. Please note that modifications are being made to the treatment plan including   reduce of gemcitabine.

## 2022-07-12 NOTE — Progress Notes (Signed)
[place]   [phone] [phone]   Clinic Follow up Note   Patient Care Team: Arthur Holms, NP as PCP - General (Nurse Practitioner) Sanda Klein, MD as PCP - Cardiology (Cardiology) Roosevelt Locks, [place] as Nurse Practitioner (Transplant Hepatology) Sandi Mariscal, MD as Consulting Physician (Interventional Radiology) Truitt Merle, MD as Consulting Physician (Hematology and Oncology)  Date of Service:  07/12/2022  CHIEF COMPLAINT: f/u of mixed liver and bile duct cancer   CURRENT THERAPY:  GEMOX every 2 weeks, durvalumab every 4 weeks   ASSESSMENT:  Connie [name] is a 67 y.o. 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  -Although she had typical image findings of [place], she underwent liver biopsy since CA 19-9 was also elevated  -liver biopsy showed well to moderately differentiated CK7 positive adenocarcinoma and her EGD was negative for upper GI tract malignancy. Tumor cells were also positive for Glycipan 3, supporting mixed HCC and cholagiocarcinoma. -she started GEMOX (cisplatin is contraindicated due to her chronic kidney disease) and continued durvalumab on 05/10/22, she has been tolerating treatment well overall  -restaging CT scan from 06/28/22 showed overall stable disease, will continue current therapy   Alcoholic cirrhosis of liver with ascites ([place]) -f/u with GI, will watch her LFTs closely on chemo      CKD (chronic kidney disease) stage 3, GFR 30-59 ml/min  (HCC) -will monitor renal function closely during chemo      Hypercalcemia -Probably related to her underlying malignancy -She previously received Aredia 2 doses -Calcium level has come down significantly since she started chemotherapy.   Thrombocytopenia -Secondary to chemotherapy, platelet is 76 today -Will reduce chemo dose, she knows to watch signs of bleeding.    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  Underlying hepatic cirrhosis suspected.   2.5 cm indeterminate central hypoechoic hepatic lesion. See above comment.   02/22/2021 Imaging   CLINICAL DATA:  Right-sided abdominal pain.   EXAM: ABDOMEN ULTRASOUND COMPLETE  IMPRESSION: 1. Cirrhosis and small ascites. 2. Gallbladder sludge. No sonographic evidence of acute cholecystitis.   02/22/2021 Imaging   CLINICAL DATA:  Abdominal pain.  Concern for diverticulitis.   EXAM: CT ABDOMEN AND PELVIS WITH CONTRAST  IMPRESSION: 1. Acute pancreatitis. No abscess or pseudocyst. 2. Cirrhosis with small ascites. 3. Colonic diverticulosis. No bowel obstruction. Normal appendix. 4. Aortic Atherosclerosis (ICD10-I70.0).  03/02/2021 Tumor Marker   CA 19-9 = 45 (^) AFP = 11 (^)   03/03/2021 Imaging   CLINICAL DATA:  Pancreatitis, worsening abdominal  pain.   EXAM: CT ABDOMEN AND PELVIS WITH CONTRAST  IMPRESSION: 1. Multiple suspicious lesions in the right hepatic lobe. Patient has underlying cirrhosis and findings are concerning for multifocal hepatocellular carcinoma. Metastatic disease is also in the differential diagnosis. Recommend MRI of the abdomen with and without contrast. If the patient cannot tolerate a MRI, consider a multiphase liver CT. 2. Increased inflammatory changes in the upper abdomen compatible with pancreatitis and a large developing pseudocyst. This is developing pseudocyst measures up to 14 cm in size. 3. Cirrhosis with evidence of portal hypertension demonstrated by esophageal varices and varices draining into the IVC. 4. Indeterminate 3 mm pulmonary nodule in the lingula. Management of this pulmonary nodule depends on the etiology of the liver lesions.   03/04/2021 Imaging   EXAM: MRI ABDOMEN WITHOUT AND WITH CONTRAST (INCLUDING MRCP)  IMPRESSION: 1. Multiple enhancing lesions are noted throughout the right lobe of the liver, as detailed above. These have indeterminate imaging characteristics. Given the background of hepatic cirrhosis, the possibility of multifocal infiltrative hepatocellular carcinoma should be considered, although none of these lesions meet definite imaging criteria to establish that diagnosis. Correlation with AFP level is recommended. Other differential considerations include malignant and benign etiologies such as fibrolamellar hepatocellular carcinoma and multifocal focal nodular hyperplasia ([place]). Consideration for repeat abdominal MRI with and without IV gadolinium (Eovist) is also suggested, although biopsy may ultimately be required to establish a tissue diagnosis. 2. 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 3. Trace volume of perihepatic ascites.   09/22/2021 Tumor Marker   CA 19-9 = 190 (^) AFP = 124 (^)    01/19/2022 Tumor Marker   CA 19-9 = 816 (^) AFP = 307 (^)   02/06/2022 Imaging   EXAM: MRI ABDOMEN WITHOUT AND WITH CONTRAST (INCLUDING MRCP)  IMPRESSION: 1. Morphologic features of the liver compatible with cirrhosis. 2. Extensive enhancing liver lesions are identified throughout both lobes of liver which demonstrate marked progression compared with the previous exam. Enhancing lesions demonstrate washout on the delayed images. In the setting of cirrhosis with progressively elevated alpha fetoprotein levels imaging findings are highly worrisome for multifocal hepatocellular carcinoma. 3. Enlarged upper abdominal lymph nodes identified. Cannot exclude underlying nodal metastasis. 4. There are several lesions within the central mesentery in the distribution of previous large pseudo cysts. The largest measures 5.0 x 3.1 cm. This is favored to represent sequelae of prior hemorrhagic pancreatitis with pseudocyst formation.   02/23/2022 Initial Diagnosis   Hepatocellular carcinoma ([place])   03/20/2022 Cancer Staging   Staging form: Liver, AJCC 8th Edition - Clinical stage from 03/20/2022: Stage IVA (cT3, cN1, cM0) - Signed by Truitt Merle, MD on 03/20/2022   03/21/2022 - 04/20/2022 Chemotherapy   Patient is on Treatment Plan : LUNG NSCLC Durvalumab (1500) q28d     04/04/2022 Pathology Results   A. LIVER, RIGHT LOBE, BIOPSY: Well to moderately differentiated CK7 positive adenocarcinoma (see comment)  COMMENT:  Given the cytohistomorphology and this immunohistochemical pattern of cytokeratin 7 positivity, the differential diagnosis would include a pancreaticobiliary or upper gastrointestinal intestinal tract primary or possibly a lung primary (not favored).    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 Pt state she feels sick a day after chemo but recovers well. Pt state that she has cold sensitivity. Pt state that her feet her swollen. Pt state that she notice a week ago. Pt denies having pain in her leg.      All other systems were reviewed with the patient and are negative.  MEDICAL HISTORY:  Past Medical History:  Diagnosis Date   Alcohol abuse    Alcoholic cirrhosis of liver (HCC)    Atrial fibrillation with RVR ([place]) 08/02/2019   Cataract    Chronic kidney disease    Helicobacter pylori gastritis 08/07/2019   Hepatitis C antibody positive in blood - negative RNA    Hypertension    liver ca 01/2022   bile duct ca 05/2022   Pancreatic pseudocyst 03/04/2021    SURGICAL HISTORY: Past Surgical History:  Procedure Laterality Date   BIOPSY  08/03/2019   Procedure: BIOPSY;  Surgeon: Gatha Mayer, MD;  Location: [facility];  Service: Endoscopy;;   ESOPHAGOGASTRODUODENOSCOPY (EGD) WITH PROPOFOL N/A 08/03/2019   Procedure: ESOPHAGOGASTRODUODENOSCOPY (EGD) WITH PROPOFOL;  Surgeon: Gatha Mayer, MD;  Location: [facility] ENDOSCOPY;  Service: Endoscopy;  Laterality: N/A;   IR IMAGING GUIDED PORT INSERTION  05/29/2022   IR RADIOLOGIST EVAL & MGMT  02/14/2022   IR RADIOLOGIST EVAL & MGMT  03/14/2022    I have reviewed the social history and family history with the patient and they are unchanged from previous note.  ALLERGIES:  has No Known Allergies.  MEDICATIONS:  Current Outpatient Medications  Medication Sig Dispense Refill   acetaminophen (TYLENOL) 500 MG tablet Take 1,000 mg by mouth every 6 (six) hours as needed  for mild pain.     amLODipine (NORVASC) 5 MG tablet Take 1 tablet by mouth daily.     diclofenac Sodium (VOLTAREN) 1 % GEL Apply 2 g topically 2 (two) times daily as needed (pain). (Patient not taking: Reported on 04/28/2022)     gabapentin (NEURONTIN) 100 MG capsule Take 1 capsule (100 mg total) by mouth 3 (three) times daily. 90 capsule 2   latanoprost (XALATAN) 0.005 % ophthalmic solution Place 1 drop into both eyes at bedtime.     lidocaine-prilocaine (EMLA) cream Apply 1 Application topically as needed. Apply 43m to Port-a-Cath site 45 mins to 1 hr before port-a-cath access. 30 g 0   lipase/protease/amylase (CREON) 12000-38000 units CPEP capsule Take 1 capsule (12,000 Units total) by mouth 3 (three) times daily with meals. 270 capsule 2   ondansetron (ZOFRAN) 8 MG tablet Take 1 tablet (8 mg total) by mouth every 8 (eight) hours as needed for nausea or vomiting. 30 tablet 2   pantoprazole (PROTONIX) 40 MG tablet Take 1 tablet (40 mg total) by mouth daily. 90 tablet 1   polyethylene glycol (MIRALAX / GLYCOLAX) 17 g packet Take 17 g by mouth 2 (two) times daily. 14 each 0   prochlorperazine (COMPAZINE) 10 MG tablet Take 1 tablet (10 mg total) by mouth every 6 (six)  hours as needed for nausea or vomiting. 30 tablet 2   No current facility-administered medications for this visit.   Facility-Administered Medications Ordered in Other Visits  Medication Dose Route Frequency Provider Last Rate Last Admin   sodium chloride flush (NS) 0.9 % injection 10 mL  10 mL Intracatheter PRN Alvy Bimler, Ni, MD   10 mL at 07/12/22 1537    PHYSICAL EXAMINATION: ECOG PERFORMANCE STATUS: 1 - Symptomatic but completely ambulatory  Vitals:   07/12/22 0950  BP: 108/68  Pulse: (!) 58  Resp: 15  Temp: 97.7 F (36.5 C)  SpO2: 100%   Wt Readings from Last 3 Encounters:  07/12/22 229 lb 9.6 oz (104.1 kg)  06/14/22 228 lb 11.2 oz (103.7 kg)  05/31/22 227 lb 4.8 oz (103.1 kg)     GENERAL:alert, no distress and  comfortable SKIN: skin color normal, no rashes or significant lesions EYES: normal, Conjunctiva are pink and non-injected, sclera clear  NEURO: alert & oriented x 3 with fluent speech   LABORATORY DATA:  I have reviewed the data as listed    Latest Ref Rng & Units 07/12/2022    9:00 AM 06/28/2022   11:33 AM 06/14/2022   10:37 AM  CBC  WBC 4.0 - 10.5 K/uL 6.3  8.2  10.3   Hemoglobin 12.0 - 15.0 g/dL 11.0  11.9  12.2   Hematocrit 36.0 - 46.0 % 32.5  34.3  35.7   Platelets 150 - 400 K/uL 76  114  192         Latest Ref Rng & Units 07/12/2022    9:00 AM 06/28/2022   11:33 AM 06/14/2022   10:37 AM  CMP  Glucose 70 - 99 mg/dL 97  83  124   BUN 8 - 23 mg/dL '7  9  11   '$ Creatinine 0.44 - 1.00 mg/dL 1.19  0.97  1.52   Sodium 135 - 145 mmol/L 140  137  137   Potassium 3.5 - 5.1 mmol/L 4.1  4.0  4.3   Chloride 98 - 111 mmol/L 109  106  108   CO2 22 - 32 mmol/L '27  25  25   '$ Calcium 8.9 - 10.3 mg/dL 10.2  9.7  10.0   Total Protein 6.5 - 8.1 g/dL 6.9  7.0  7.4   Total Bilirubin 0.3 - 1.2 mg/dL 0.6  0.7  0.6   Alkaline Phos 38 - 126 U/L 116  136  158   AST 15 - 41 U/L 67  119  190   ALT 0 - 44 U/L 29  55  112       RADIOGRAPHIC STUDIES: I have personally reviewed the radiological images as listed and agreed with the findings in the report. No results found.    Orders Placed This Encounter  Procedures   MR Abdomen W Wo Contrast    Standing Status:   Future    Standing Expiration Date:   07/12/2023    Order Specific Question:   If indicated for the ordered procedure, I authorize the administration of contrast media per Radiology protocol    Answer:   Yes    Order Specific Question:   What is the patient's sedation requirement?    Answer:   No Sedation    Order Specific Question:   Does the patient have a pacemaker or implanted devices?    Answer:   No    Order Specific Question:   Preferred imaging location?    Answer:  [facility] (table limit - 550 lbs)   CBC with  Differential ([place] Only)    Standing Status:   Future    Standing Expiration Date:   08/25/2023   CMP ([place] only)    Standing Status:   Future    Standing Expiration Date:   08/25/2023   All questions were answered. The patient knows to call the clinic with any problems, questions or concerns. No barriers to learning was detected. The total time spent in the appointment was 30 minutes.     Truitt Merle, MD 07/12/2022   Felicity Coyer, CMA, am acting as scribe for Truitt Merle, MD.   I have reviewed the above documentation for accuracy and completeness, and I agree with the above.

## 2022-07-12 NOTE — Patient Instructions (Signed)
[facility]  Discharge Instructions: Thank you for choosing [facility] to provide your oncology and hematology care.   If you have a lab appointment with the Cancer Center, please go directly to the Cancer Center and check in at the registration area.   Wear comfortable clothing and clothing appropriate for easy access to any Portacath or PICC line.   We strive to give you quality time with your provider. You may need to reschedule your appointment if you arrive late (15 or more minutes).  Arriving late affects you and other patients whose appointments are after yours.  Also, if you miss three or more appointments without notifying the office, you may be dismissed from the clinic at the provider's discretion.      For prescription refill requests, have your pharmacy contact our office and allow 72 hours for refills to be completed.    Today you received the following chemotherapy and/or immunotherapy agents: Imfinzi/Gemzar/Oxaliplatin   To help prevent nausea and vomiting after your treatment, we encourage you to take your nausea medication as directed.  BELOW ARE SYMPTOMS THAT SHOULD BE REPORTED IMMEDIATELY: *FEVER GREATER THAN 100.4 F (38 C) OR HIGHER *CHILLS OR SWEATING *NAUSEA AND VOMITING THAT IS NOT CONTROLLED WITH YOUR NAUSEA MEDICATION *UNUSUAL SHORTNESS OF BREATH *UNUSUAL BRUISING OR BLEEDING *URINARY PROBLEMS (pain or burning when urinating, or frequent urination) *BOWEL PROBLEMS (unusual diarrhea, constipation, pain near the anus) TENDERNESS IN MOUTH AND THROAT WITH OR WITHOUT PRESENCE OF ULCERS (sore throat, sores in mouth, or a toothache) UNUSUAL RASH, SWELLING OR PAIN  UNUSUAL VAGINAL DISCHARGE OR ITCHING   Items with * indicate a potential emergency and should be followed up as soon as possible or go to the Emergency Department if any problems should occur.  Please show the CHEMOTHERAPY ALERT CARD or IMMUNOTHERAPY ALERT  CARD at check-in to the Emergency Department and triage nurse.  Should you have questions after your visit or need to cancel or reschedule your appointment, please contact [facility]  Dept: [phone]  and follow the prompts.  Office hours are 8:00 a.m. to 4:30 p.m. Monday - Friday. Please note that voicemails left after 4:00 p.m. may not be returned until the following business day.  We are closed weekends and major holidays. You have access to a nurse at all times for urgent questions. Please call the main number to the clinic Dept: [phone] and follow the prompts.   For any non-urgent questions, you may also contact your provider using MyChart. We now offer e-Visits for anyone 18 and older to request care online for non-urgent symptoms. For details visit [email or web address].   Also download the MyChart app! Go to the app store, search "MyChart", open the app, select , and log in with your MyChart username and password.   

## 2022-07-26 MED FILL — Dexamethasone Sodium Phosphate Inj 100 MG/10ML: INTRAMUSCULAR | Qty: 1 | Status: AC

## 2022-07-26 NOTE — Progress Notes (Unsigned)
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  Underlying hepatic cirrhosis suspected.   2.5 cm indeterminate central hypoechoic hepatic lesion. See above comment.   02/22/2021 Imaging   CLINICAL DATA:  Right-sided abdominal pain.   EXAM: ABDOMEN ULTRASOUND COMPLETE  IMPRESSION: 1. Cirrhosis and small ascites. 2. Gallbladder sludge. No sonographic evidence of acute cholecystitis.   02/22/2021 Imaging   CLINICAL DATA:  Abdominal pain.  Concern for diverticulitis.   EXAM: CT ABDOMEN AND PELVIS WITH CONTRAST  IMPRESSION: 1. Acute pancreatitis. No abscess or pseudocyst. 2. Cirrhosis with small ascites. 3. Colonic diverticulosis. No bowel obstruction. Normal appendix. 4. Aortic Atherosclerosis (ICD10-I70.0).   03/02/2021 Tumor Marker   CA 19-9 = 45 (^) AFP = 11 (^)   03/03/2021 Imaging   CLINICAL DATA:  Pancreatitis, worsening abdominal pain.   EXAM: CT ABDOMEN AND PELVIS WITH CONTRAST  IMPRESSION: 1. Multiple suspicious lesions in the right hepatic lobe. Patient has underlying cirrhosis and findings are concerning for  multifocal hepatocellular carcinoma. Metastatic disease is also in the differential diagnosis. Recommend MRI of the abdomen with and without contrast. If the patient cannot tolerate a MRI, consider a multiphase liver CT. 2. Increased inflammatory changes in the upper abdomen compatible with pancreatitis and a large developing pseudocyst. This is developing pseudocyst measures up to 14 cm in size. 3. Cirrhosis with evidence of portal hypertension demonstrated by esophageal varices and varices draining into the IVC. 4. Indeterminate 3 mm pulmonary nodule in the lingula. Management of this pulmonary nodule depends on the etiology of the liver lesions.   03/04/2021 Imaging   EXAM: MRI ABDOMEN WITHOUT AND WITH CONTRAST (INCLUDING MRCP)  IMPRESSION: 1. Multiple enhancing lesions are noted throughout the right lobe of the liver, as detailed above. These have indeterminate imaging characteristics. Given the background of hepatic cirrhosis, the possibility of multifocal infiltrative hepatocellular carcinoma should be considered, although none of these lesions meet definite imaging criteria to establish that diagnosis. Correlation with AFP level is recommended. Other differential considerations include malignant and benign etiologies such as fibrolamellar hepatocellular carcinoma and multifocal focal nodular hyperplasia ([place]). Consideration for repeat abdominal MRI with and without IV gadolinium (Eovist) is also suggested, although biopsy may ultimately be required to establish a tissue diagnosis. 2. 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 3. Trace volume of perihepatic ascites.   09/22/2021 Tumor Marker   CA 19-9 = 190 (^) AFP = 124 (^)   01/19/2022 Tumor Marker   CA 19-9 = 816 (^) AFP = 307 (^)   02/06/2022 Imaging   EXAM: MRI ABDOMEN WITHOUT AND WITH CONTRAST (INCLUDING MRCP)  IMPRESSION: 1. Morphologic features of the liver  compatible with cirrhosis. 2.  Extensive enhancing liver lesions are identified throughout both lobes of liver which demonstrate marked progression compared with the previous exam. Enhancing lesions demonstrate washout on the delayed images. In the setting of cirrhosis with progressively elevated alpha fetoprotein levels imaging findings are highly worrisome for multifocal hepatocellular carcinoma. 3. Enlarged upper abdominal lymph nodes identified. Cannot exclude underlying nodal metastasis. 4. There are several lesions within the central mesentery in the distribution of previous large pseudo cysts. The largest measures 5.0 x 3.1 cm. This is favored to represent sequelae of prior hemorrhagic pancreatitis with pseudocyst formation.   02/23/2022 Initial Diagnosis   Hepatocellular carcinoma ([place])   03/20/2022 Cancer Staging   Staging form: Liver, AJCC 8th Edition - Clinical stage from 03/20/2022: Stage IVA (cT3, cN1, cM0) - Signed by Truitt Merle, MD on 03/20/2022   03/21/2022 - 04/20/2022 Chemotherapy   Patient is on Treatment Plan : LUNG NSCLC Durvalumab (1500) q28d     04/04/2022 Pathology Results   A. LIVER, RIGHT LOBE, BIOPSY: Well to moderately differentiated CK7 positive adenocarcinoma (see comment)  COMMENT:  Given the cytohistomorphology and this immunohistochemical pattern of cytokeratin 7 positivity, the differential diagnosis would include a pancreaticobiliary or upper gastrointestinal intestinal tract primary or possibly a lung primary (not favored).    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 Burr Medico 07/12/2022 and proceeded with cycle 5 gemox with dose reduced gemcitabine for low platelet count. She continues to tolerate treatment.  Energy and appetite are adequate.  Bowels moving normally.  She has occasional nausea without vomiting well-managed with medication.  Cold sensitivity last 1.5 weeks without residual neuropathy in the absence of cold.  She continues to have a mild dry nagging cough which began with treatment, no chest pain, dyspnea, fever, chills.  She continues to cut back smoking.  Denies pain or any other new specific complaints.  ROS  All other systems reviewed and negative  Past Medical History:  Diagnosis Date   Alcohol abuse    Alcoholic cirrhosis of liver (HCC)    Atrial fibrillation with RVR ([place]) 08/02/2019   Cataract    Chronic kidney disease    Helicobacter pylori gastritis 08/07/2019   Hepatitis C antibody positive in blood - negative RNA    Hypertension    liver ca 01/2022   bile duct ca 05/2022   Pancreatic pseudocyst 03/04/2021     Past Surgical History:  Procedure Laterality Date   BIOPSY  08/03/2019   Procedure: BIOPSY;  Surgeon: Gatha Mayer, MD;  Location: [facility];  Service: Endoscopy;;   ESOPHAGOGASTRODUODENOSCOPY (EGD) WITH PROPOFOL N/A 08/03/2019   Procedure: ESOPHAGOGASTRODUODENOSCOPY (EGD) WITH PROPOFOL;  Surgeon: Gatha Mayer, MD;  Location: [facility] ENDOSCOPY;  Service: Endoscopy;  Laterality: N/A;   IR IMAGING GUIDED PORT INSERTION  05/29/2022   IR RADIOLOGIST EVAL & MGMT  02/14/2022   IR RADIOLOGIST EVAL & MGMT  03/14/2022     Outpatient Encounter Medications  as of 07/27/2022  Medication Sig   acetaminophen (TYLENOL) 500 MG tablet Take 1,000 mg by mouth every 6 (six) hours as needed for mild pain.   amLODipine (NORVASC) 5 MG tablet Take 1 tablet by mouth daily.   diclofenac Sodium  (VOLTAREN) 1 % GEL Apply 2 g topically 2 (two) times daily as needed (pain).   gabapentin (NEURONTIN) 100 MG capsule Take 1 capsule (100 mg total) by mouth 3 (three) times daily.   latanoprost (XALATAN) 0.005 % ophthalmic solution Place 1 drop into both eyes at bedtime.   lidocaine-prilocaine (EMLA) cream Apply 1 Application topically as needed. Apply 71ml to Port-a-Cath site 45 mins to 1 hr before port-a-cath access.   lipase/protease/amylase (CREON) 12000-38000 units CPEP capsule Take 1 capsule (12,000 Units total) by mouth 3 (three) times daily with meals.   ondansetron (ZOFRAN) 8 MG tablet Take 1 tablet (8 mg total) by mouth every 8 (eight) hours as needed for nausea or vomiting.   pantoprazole (PROTONIX) 40 MG tablet Take 1 tablet (40 mg total) by mouth daily.   polyethylene glycol (MIRALAX / GLYCOLAX) 17 g packet Take 17 g by mouth 2 (two) times daily.   prochlorperazine (COMPAZINE) 10 MG tablet Take 1 tablet (10 mg total) by mouth every 6 (six) hours as needed for nausea or vomiting.   No facility-administered encounter medications on file as of 07/27/2022.     Today's Vitals   07/27/22 0910 07/27/22 0913  BP: 133/79   Pulse: 61   Resp: 18   Temp: 97.8 F (36.6 C)   TempSrc: Oral   SpO2: 98%   Weight: 228 lb 2 oz (103.5 kg)   PainSc:  0-No pain   Body mass index is 37.96 kg/m.   PHYSICAL EXAM GENERAL:alert, no distress and comfortable SKIN: no rash  EYES: sclera clear NECK: without mass LUNGS: clear with normal breathing effort HEART: regular rate & rhythm, mild bilateral lower extremity edema ABDOMEN: abdomen soft, non-tender and normal bowel sounds NEURO: alert & oriented x 3 with fluent speech PAC without erythema    CBC    Component Value Date/Time   WBC 6.5 07/27/2022 0845   WBC 8.2 06/28/2022 1133   RBC 3.18 (L) 07/27/2022 0845   HGB 10.9 (L) 07/27/2022 0845   HCT 32.3 (L) 07/27/2022 0845   PLT 92 (L) 07/27/2022 0845   MCV 101.6 (H) 07/27/2022 0845   MCH  34.3 (H) 07/27/2022 0845   MCHC 33.7 07/27/2022 0845   RDW 18.8 (H) 07/27/2022 0845   LYMPHSABS 2.2 07/27/2022 0845   MONOABS 1.3 (H) 07/27/2022 0845   EOSABS 0.3 07/27/2022 0845   BASOSABS 0.0 07/27/2022 0845     CMP     Component Value Date/Time   NA 138 07/27/2022 0845   K 4.1 07/27/2022 0845   CL 108 07/27/2022 0845   CO2 26 07/27/2022 0845   GLUCOSE 86 07/27/2022 0845   BUN 7 (L) 07/27/2022 0845   CREATININE 1.21 (H) 07/27/2022 0845   CALCIUM 10.3 07/27/2022 0845   CALCIUM 11.1 (H) 03/30/2022 1051   PROT 7.6 07/27/2022 0845   ALBUMIN 3.0 (L) 07/27/2022 0845   AST 60 (H) 07/27/2022 0845   ALT 23 07/27/2022 0845   ALKPHOS 104 07/27/2022 0845   BILITOT 0.7 07/27/2022 0845   GFRNONAA 49 (L) 07/27/2022 0845   GFRAA 45 (L) 08/04/2019 0337     ASSESSMENT & PLAN:Malon Connie [name] is a 67 y.o. female with    Next HCC and bile duct cancer  suspected Hepatocellular carcinoma ([place]) -Liver cirrhosis and a 2.5cm mass was found in 07/2019 but she did not follow up -due to rising AFP, she had MRI on 02/06/2022 which showed extensive liver lesions (largest 13cm) throughout both lobes with marked progression from prior MRI; enlarged upper abdominal lymph nodes, no distant mets -she started Durvaluamb every 4 weeks, and Tremelimumab 300mg  once on day 1 of cycle 1, on 03/21/2022.  -Although she had typical image findings of [place], she underwent liver biopsy since CA 19-9 was also elevated; biopsy showed well to moderately differentiated CK7 positive adenocarcinoma; the IHC staining profile includes pancreaticobiliary or upper GI primary, and possibly lung but not favored. EGD was negative -She has mixed HCC and cholangiocarcinoma features.  -Began GEMOX q2 weeks on 05/10/22 and continues durvalumab q4 weeks  -CT 06/28/22 showed stable disease -Ms. Lince appears stable. S/p cycle 5 Gemox, tolerating well with mild nausea and cold sensitivity. A chronic cough is unchanged. Side effects are adequately  controlled with supportive care at home. She is able to recover and function well. 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 The patient knows to call the clinic with any problems, questions or concerns. No barriers to learning were detected.   Cira Rue, NP-C 07/27/2022

## 2022-07-27 ENCOUNTER — Other Ambulatory Visit: Payer: Self-pay

## 2022-07-27 ENCOUNTER — Inpatient Hospital Stay: Payer: 59

## 2022-07-27 ENCOUNTER — Encounter: Payer: Self-pay | Admitting: Nurse Practitioner

## 2022-07-27 ENCOUNTER — Inpatient Hospital Stay (HOSPITAL_BASED_OUTPATIENT_CLINIC_OR_DEPARTMENT_OTHER): Payer: 59 | Admitting: Nurse Practitioner

## 2022-07-27 VITALS — BP 157/87 | HR 91 | Temp 97.9°F | Resp 18

## 2022-07-27 VITALS — BP 133/79 | HR 61 | Temp 97.8°F | Resp 18 | Wt 228.1 lb

## 2022-07-27 DIAGNOSIS — C220 Liver cell carcinoma: Secondary | ICD-10-CM | POA: Diagnosis not present

## 2022-07-27 DIAGNOSIS — Z5111 Encounter for antineoplastic chemotherapy: Secondary | ICD-10-CM | POA: Diagnosis not present

## 2022-07-27 DIAGNOSIS — C221 Intrahepatic bile duct carcinoma: Secondary | ICD-10-CM

## 2022-07-27 LAB — CBC WITH DIFFERENTIAL (CANCER CENTER ONLY)
Abs Immature Granulocytes: 0.03 10*3/uL (ref 0.00–0.07)
Basophils Absolute: 0 10*3/uL (ref 0.0–0.1)
Basophils Relative: 0 %
Eosinophils Absolute: 0.3 10*3/uL (ref 0.0–0.5)
Eosinophils Relative: 5 %
HCT: 32.3 % — ABNORMAL LOW (ref 36.0–46.0)
Hemoglobin: 10.9 g/dL — ABNORMAL LOW (ref 12.0–15.0)
Immature Granulocytes: 1 %
Lymphocytes Relative: 34 %
Lymphs Abs: 2.2 10*3/uL (ref 0.7–4.0)
MCH: 34.3 pg — ABNORMAL HIGH (ref 26.0–34.0)
MCHC: 33.7 g/dL (ref 30.0–36.0)
MCV: 101.6 fL — ABNORMAL HIGH (ref 80.0–100.0)
Monocytes Absolute: 1.3 10*3/uL — ABNORMAL HIGH (ref 0.1–1.0)
Monocytes Relative: 19 %
Neutro Abs: 2.7 10*3/uL (ref 1.7–7.7)
Neutrophils Relative %: 41 %
Platelet Count: 92 10*3/uL — ABNORMAL LOW (ref 150–400)
RBC: 3.18 MIL/uL — ABNORMAL LOW (ref 3.87–5.11)
RDW: 18.8 % — ABNORMAL HIGH (ref 11.5–15.5)
Smear Review: NORMAL
WBC Count: 6.5 10*3/uL (ref 4.0–10.5)
nRBC: 0 % (ref 0.0–0.2)

## 2022-07-27 LAB — CMP (CANCER CENTER ONLY)
ALT: 23 U/L (ref 0–44)
AST: 60 U/L — ABNORMAL HIGH (ref 15–41)
Albumin: 3 g/dL — ABNORMAL LOW (ref 3.5–5.0)
Alkaline Phosphatase: 104 U/L (ref 38–126)
Anion gap: 4 — ABNORMAL LOW (ref 5–15)
BUN: 7 mg/dL — ABNORMAL LOW (ref 8–23)
CO2: 26 mmol/L (ref 22–32)
Calcium: 10.3 mg/dL (ref 8.9–10.3)
Chloride: 108 mmol/L (ref 98–111)
Creatinine: 1.21 mg/dL — ABNORMAL HIGH (ref 0.44–1.00)
GFR, Estimated: 49 mL/min — ABNORMAL LOW (ref >60)
Glucose, Bld: 86 mg/dL (ref 70–99)
Potassium: 4.1 mmol/L (ref 3.5–5.1)
Sodium: 138 mmol/L (ref 135–145)
Total Bilirubin: 0.7 mg/dL (ref 0.3–1.2)
Total Protein: 7.6 g/dL (ref 6.5–8.1)

## 2022-07-27 MED ORDER — OXALIPLATIN CHEMO INJECTION 100 MG/20ML
85.0000 mg/m2 | Freq: Once | INTRAVENOUS | Status: AC
  Administered 2022-07-27: 200 mg via INTRAVENOUS
  Filled 2022-07-27: qty 40

## 2022-07-27 MED ORDER — SODIUM CHLORIDE 0.9 % IV SOLN
10.0000 mg | Freq: Once | INTRAVENOUS | Status: AC
  Administered 2022-07-27: 10 mg via INTRAVENOUS
  Filled 2022-07-27: qty 10

## 2022-07-27 MED ORDER — SODIUM CHLORIDE 0.9 % IV SOLN: Freq: Once | INTRAVENOUS | Status: AC

## 2022-07-27 MED ORDER — SODIUM CHLORIDE 0.9% FLUSH
10.0000 mL | INTRAVENOUS | Status: DC | PRN
  Administered 2022-07-27: 10 mL

## 2022-07-27 MED ORDER — DEXTROSE 5 % IV SOLN: Freq: Once | INTRAVENOUS | Status: AC

## 2022-07-27 MED ORDER — HEPARIN SOD (PORK) LOCK FLUSH 100 UNIT/ML IV SOLN
500.0000 [IU] | Freq: Once | INTRAVENOUS | Status: AC | PRN
  Administered 2022-07-27: 500 [IU]

## 2022-07-27 MED ORDER — SODIUM CHLORIDE 0.9 % IV SOLN
600.0000 mg/m2 | Freq: Once | INTRAVENOUS | Status: AC
  Administered 2022-07-27: 1330 mg via INTRAVENOUS
  Filled 2022-07-27: qty 34.98

## 2022-07-27 MED ORDER — SODIUM CHLORIDE 0.9 % IV SOLN
16.0000 mg | Freq: Once | INTRAVENOUS | Status: AC
  Administered 2022-07-27: 16 mg via INTRAVENOUS
  Filled 2022-07-27: qty 8

## 2022-07-27 NOTE — Patient Instructions (Signed)
[facility]  Discharge Instructions: Thank you for choosing [facility] to provide your oncology and hematology care.   If you have a lab appointment with the [facility], please go directly to the [facility] and check in at the registration area.   Wear comfortable clothing and clothing appropriate for easy access to any Portacath or PICC line.   We strive to give you quality time with your provider. You may need to reschedule your appointment if you arrive late (15 or more minutes).  Arriving late affects you and other patients whose appointments are after yours.  Also, if you miss three or more appointments without notifying the office, you may be dismissed from the clinic at the provider's discretion.      For prescription refill requests, have your pharmacy contact our office and allow 72 hours for refills to be completed.    Today you received the following chemotherapy and/or immunotherapy agents: Gemzar/Oxaliplatin   To help prevent nausea and vomiting after your treatment, we encourage you to take your nausea medication as directed.  BELOW ARE SYMPTOMS THAT SHOULD BE REPORTED IMMEDIATELY: *FEVER GREATER THAN 100.4 F (38 C) OR HIGHER *CHILLS OR SWEATING *NAUSEA AND VOMITING THAT IS NOT CONTROLLED WITH YOUR NAUSEA MEDICATION *UNUSUAL SHORTNESS OF BREATH *UNUSUAL BRUISING OR BLEEDING *URINARY PROBLEMS (pain or burning when urinating, or frequent urination) *BOWEL PROBLEMS (unusual diarrhea, constipation, pain near the anus) TENDERNESS IN MOUTH AND THROAT WITH OR WITHOUT PRESENCE OF ULCERS (sore throat, sores in mouth, or a toothache) UNUSUAL RASH, SWELLING OR PAIN  UNUSUAL VAGINAL DISCHARGE OR ITCHING   Items with * indicate a potential emergency and should be followed up as soon as possible or go to the Emergency Department if any problems should occur.  Please show the CHEMOTHERAPY ALERT CARD or IMMUNOTHERAPY ALERT CARD at  check-in to the Emergency Department and triage nurse.  Should you have questions after your visit or need to cancel or reschedule your appointment, please contact [facility]  Dept: [phone]  and follow the prompts.  Office hours are 8:00 a.m. to 4:30 p.m. Monday - Friday. Please note that voicemails left after 4:00 p.m. may not be returned until the following business day.  We are closed weekends and major holidays. You have access to a nurse at all times for urgent questions. Please call the main number to the clinic Dept: [phone] and follow the prompts.   For any non-urgent questions, you may also contact your provider using MyChart. We now offer e-Visits for anyone 86 and older to request care online for non-urgent symptoms. For details visit [email or web address].   Also download the MyChart app! Go to the app store, search "MyChart", open the app, select [facility], and log in with your MyChart username and password.

## 2022-07-27 NOTE — Progress Notes (Signed)
Per Silvio Clayman, NP- ok to treat today with platelets of 92.

## 2022-07-27 NOTE — Progress Notes (Signed)
Continue Gemzar 600mg /m2 with treatment today per Lacie.  Raul Del Ivor, [place], BCPS, BCOP 07/27/2022 10:07 AM

## 2022-08-01 ENCOUNTER — Other Ambulatory Visit: Payer: Self-pay

## 2022-08-01 ENCOUNTER — Other Ambulatory Visit: Payer: Self-pay | Admitting: Hematology

## 2022-08-01 ENCOUNTER — Ambulatory Visit (HOSPITAL_COMMUNITY)
Admission: RE | Admit: 2022-08-01 | Discharge: 2022-08-01 | Disposition: A | Discharge status: Home / Self Care | Payer: 59 | Source: Ambulatory Visit | Attending: Hematology | Admitting: Hematology

## 2022-08-01 DIAGNOSIS — C220 Liver cell carcinoma: Secondary | ICD-10-CM

## 2022-08-01 DIAGNOSIS — C221 Intrahepatic bile duct carcinoma: Secondary | ICD-10-CM

## 2022-08-01 MED ORDER — GADOBUTROL 1 MMOL/ML IV SOLN
10.0000 mL | Freq: Once | INTRAVENOUS | Status: AC | PRN
  Administered 2022-08-01: 10 mL via INTRAVENOUS

## 2022-08-04 ENCOUNTER — Other Ambulatory Visit: Payer: Self-pay

## 2022-08-07 ENCOUNTER — Ambulatory Visit (HOSPITAL_COMMUNITY): Payer: 59

## 2022-08-09 MED FILL — Dexamethasone Sodium Phosphate Inj 100 MG/10ML: INTRAMUSCULAR | Qty: 1 | Status: AC

## 2022-08-10 ENCOUNTER — Inpatient Hospital Stay: Payer: 59

## 2022-08-10 ENCOUNTER — Inpatient Hospital Stay: Payer: 59 | Admitting: Hematology

## 2022-08-10 NOTE — Assessment & Plan Note (Deleted)
-  Stage IVA (cT3, cN1, cM0)  with diffuse liver mets, unresectable  -Liver cirrhosis and a 2.5cm mass was found in 07/2019 but she did not follow up -due to rising AFP, she had MRI on 02/06/2022 which showed extensive liver lesions (largest 13cm) throughout both lobes with marked progression from prior MRI; enlarged upper abdominal lymph nodes, no distant mets -she started Durvaluamb every 4 weeks, and Tremelimumab 300mg  once on day 1 of cycle 1, on 03/21/2022.  -Although she had typical image findings of HCC, she underwent liver biopsy since CA 19-9 was also elevated  -liver biopsy showed well to moderately differentiated CK7 positive adenocarcinoma and her EGD was negative for upper GI tract malignancy. Tumor cells were also positive for Glycipan 3, supporting mixed HCC and cholagiocarcinoma. -she started GEMOX (cisplatin is contraindicated due to her chronic kidney disease) and continued durvalumab on 05/10/22, she has been tolerating treatment well overall  -restaging CT scan from 06/28/22 showed overall stable disease -restaging abdominal MRI on 08/01/2022 unfortunately showed marked interval progression of hepatic neoplastic process.Extensive involvement the liver, and stable borderline enlarged periportal and celiac axis lymph nodes.  I reviewed her images and discussed the findings with patient. -I discussed next line therapy options.  Her kidney function has much improved lately, GFR around 50, will let her try cisplatin and gemcitabine first, if her kidney function deteriorates, will change to FOLFOX.,  Will continue durvalumab if her insurance approves.

## 2022-08-10 NOTE — Assessment & Plan Note (Deleted)
-  Probably related to her underlying malignancy -She previously received Aredia 2 doses -Calcium level has come down significantly since she started chemotherapy. 

## 2022-08-10 NOTE — Assessment & Plan Note (Deleted)
-  will monitor renal function closely during chemo     

## 2022-08-10 NOTE — Assessment & Plan Note (Deleted)
-  f/u with GI, will watch her LFTs closely on chemo     

## 2022-08-11 ENCOUNTER — Other Ambulatory Visit: Payer: Self-pay

## 2022-08-15 NOTE — Progress Notes (Unsigned)
[facility]   [phone] [phone]) [phone]   Clinic Follow up Note   Patient Care Team: Loura Back, NP as PCP - General (Nurse Practitioner) Thurmon Fair, MD as PCP - Cardiology (Cardiology) Annamarie Major, CRNP as Nurse Practitioner (Transplant Hepatology) Simonne Come, MD as Consulting Physician (Interventional Radiology) Malachy Mood, MD as Consulting Physician (Hematology and Oncology)  Date of Service:  08/16/2022  CHIEF COMPLAINT: f/u of mixed HCC and bile duct cancer   CURRENT THERAPY: GEMOX every 2 weeks, durvalumab every 4 weeks    ASSESSMENT: Connie [name] is a 67 y.o. 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  -Although she had typical image findings of HCC, she underwent liver biopsy since CA 19-9 was also elevated  -liver biopsy showed well to moderately differentiated CK7 positive adenocarcinoma and her EGD was negative for upper GI tract malignancy. Tumor cells were also positive for Glycipan 3, supporting mixed HCC and cholagiocarcinoma. -she started GEMOX (cisplatin is contraindicated due to her chronic kidney disease) and continued durvalumab on 05/10/22, she has been tolerating treatment well overall  -restaging MRI from 08/01/22 showed disease progression in liver. I reviewed her scan myself and discussed with radiologist (addendum was made today). I personally reviewed with pt also  -her tumor marker AFP has significantly increased lately, but her CA 19.9 is  trending down.  -Her renal function has improved lately.  I recommend to change her treatment to cisplatin, gemcitabine, atezolizumab and bevacizumab, starting next week -chemo consent obtained today    Alcoholic cirrhosis of liver with ascites (HCC) -f/u with GI, will watch her LFTs closely on chemo      CKD (chronic kidney disease) stage 3, GFR 30-59 ml/min (HCC) -will monitor renal function closely during chemo    -improved lately      PLAN: -lab reviewed -discuss Liver MR scan-Progression -Discuss Tumor marker results -Discuss changing chemo regiment immunotherapy and biological agent and its side effects. -cancel treatment today due to change of treatment.  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  Underlying hepatic cirrhosis suspected.   2.5 cm indeterminate central hypoechoic hepatic lesion. See above comment.   02/22/2021 Imaging   CLINICAL DATA:  Right-sided abdominal pain.   EXAM: ABDOMEN ULTRASOUND COMPLETE  IMPRESSION: 1. Cirrhosis and small ascites. 2. Gallbladder sludge. No sonographic evidence of acute cholecystitis.   02/22/2021 Imaging   CLINICAL DATA:  Abdominal pain.  Concern for diverticulitis.   EXAM: CT ABDOMEN AND PELVIS WITH CONTRAST  IMPRESSION: 1. Acute pancreatitis. No abscess or pseudocyst. 2. Cirrhosis  with small ascites. 3. Colonic diverticulosis. No bowel obstruction. Normal appendix. 4. Aortic Atherosclerosis (ICD10-I70.0).   03/02/2021  Tumor Marker   CA 19-9 = 45 (^) AFP = 11 (^)   03/03/2021 Imaging   CLINICAL DATA:  Pancreatitis, worsening abdominal pain.   EXAM: CT ABDOMEN AND PELVIS WITH CONTRAST  IMPRESSION: 1. Multiple suspicious lesions in the right hepatic lobe. Patient has underlying cirrhosis and findings are concerning for multifocal hepatocellular carcinoma. Metastatic disease is also in the differential diagnosis. Recommend MRI of the abdomen with and without contrast. If the patient cannot tolerate a MRI, consider a multiphase liver CT. 2. Increased inflammatory changes in the upper abdomen compatible with pancreatitis and a large developing pseudocyst. This is developing pseudocyst measures up to 14 cm in size. 3. Cirrhosis with evidence of portal hypertension demonstrated by esophageal varices and varices draining into the IVC. 4. Indeterminate 3 mm pulmonary nodule in the lingula. Management of this pulmonary nodule depends on the etiology of the liver lesions.   03/04/2021 Imaging   EXAM: MRI ABDOMEN WITHOUT AND WITH CONTRAST (INCLUDING MRCP)  IMPRESSION: 1. Multiple enhancing lesions are noted throughout the right lobe of the liver, as detailed above. These have indeterminate imaging characteristics. Given the background of hepatic cirrhosis, the possibility of multifocal infiltrative hepatocellular carcinoma should be considered, although none of these lesions meet definite imaging criteria to establish that diagnosis. Correlation with AFP level is recommended. Other differential considerations include malignant and benign etiologies such as fibrolamellar hepatocellular carcinoma and multifocal focal nodular hyperplasia (FNH). Consideration for repeat abdominal MRI with and without IV gadolinium (Eovist) is also suggested, although biopsy may ultimately be required to establish a tissue diagnosis. 2. Pancreatitis with large infiltrative pancreatic pseudocysts in the root of the small bowel  mesentery. 3. Trace volume of perihepatic ascites.   09/22/2021 Tumor Marker   CA 19-9 = 190 (^) AFP = 124 (^)   01/19/2022 Tumor Marker   CA 19-9 = 816 (^) AFP = 307 (^)   02/06/2022 Imaging   EXAM: MRI ABDOMEN WITHOUT AND WITH CONTRAST (INCLUDING MRCP)  IMPRESSION: 1. Morphologic features of the liver compatible with cirrhosis. 2. Extensive enhancing liver lesions are identified throughout both lobes of liver which demonstrate marked progression compared with the previous exam. Enhancing lesions demonstrate washout on the delayed images. In the setting of cirrhosis with progressively elevated alpha fetoprotein levels imaging findings are highly worrisome for multifocal hepatocellular carcinoma. 3. Enlarged upper abdominal lymph nodes identified. Cannot exclude underlying nodal metastasis. 4. There are several lesions within the central mesentery in the distribution of previous large pseudo cysts. The largest measures 5.0 x 3.1 cm. This is favored to represent sequelae of prior hemorrhagic pancreatitis with pseudocyst formation.   02/23/2022 Initial Diagnosis   Hepatocellular carcinoma (HCC)   03/20/2022 Cancer Staging   Staging form: Liver, AJCC 8th Edition - Clinical stage from 03/20/2022: Stage IVA (cT3, cN1, cM0) - Signed by Malachy Mood, MD on 03/20/2022   03/21/2022 - 04/20/2022 Chemotherapy   Patient is on Treatment Plan : LUNG NSCLC Durvalumab (1500) q28d     04/04/2022 Pathology Results   A. LIVER, RIGHT LOBE, BIOPSY: Well to moderately differentiated CK7 positive adenocarcinoma (see comment)  COMMENT:  Given the cytohistomorphology and this immunohistochemical pattern of cytokeratin 7 positivity, the differential diagnosis would include a pancreaticobiliary or upper gastrointestinal intestinal tract primary or possibly a lung primary (not favored).    05/11/2022 - 05/11/2022 Chemotherapy   Patient is on Treatment Plan : BLADDER  Durvalumab (10) q14d      Cholangiocarcinoma of liver  04/23/2022 Initial Diagnosis   Cholangiocarcinoma of liver (HCC)   05/11/2022 - 05/11/2022 Chemotherapy   Patient is on Treatment Plan : BILIARY TRACT Cisplatin + Gemcitabine D1,8 q21d     05/11/2022 - 07/27/2022 Chemotherapy   Patient is on Treatment Plan : PANCREAS GemOx q14d     08/23/2022 -  Chemotherapy   Patient is on Treatment Plan : BILIARY TRACT Cisplatin + Gemcitabine D1,8 + Durvalumab (1500) D1 q21d / Durvalumab (1500) q28d        INTERVAL HISTORY:  Connie [name] is here for a follow up of mixed HCC and bile duct cancer  She was last seen by  NP Lacie on 07/27/2022. She presents to the clinic accompanied by her husband. Pt report she was sick after her last treatment. Pt reports of being fatigue. She is able to house work. Pt appetite is very good.      All other systems were reviewed with the patient and are negative.  MEDICAL HISTORY:  Past Medical History:  Diagnosis Date   Alcohol abuse    Alcoholic cirrhosis of liver (HCC)    Atrial fibrillation with RVR (HCC) 08/02/2019   Cataract    Chronic kidney disease    Helicobacter pylori gastritis 08/07/2019   Hepatitis C antibody positive in blood - negative RNA    Hypertension    liver ca 01/2022   bile duct ca 05/2022   Pancreatic pseudocyst 03/04/2021    SURGICAL HISTORY: Past Surgical History:  Procedure Laterality Date   BIOPSY  08/03/2019   Procedure: BIOPSY;  Surgeon: Iva Boop, MD;  Location: [facility] ENDOSCOPY;  Service: Endoscopy;;   ESOPHAGOGASTRODUODENOSCOPY (EGD) WITH PROPOFOL N/A 08/03/2019   Procedure: ESOPHAGOGASTRODUODENOSCOPY (EGD) WITH PROPOFOL;  Surgeon: Iva Boop, MD;  Location: [facility] ENDOSCOPY;  Service: Endoscopy;  Laterality: N/A;   IR IMAGING GUIDED PORT INSERTION  05/29/2022   IR RADIOLOGIST EVAL & MGMT  02/14/2022   IR RADIOLOGIST EVAL & MGMT  03/14/2022    I have reviewed the social history and family history with the patient and they are unchanged from  previous note.  ALLERGIES:  has No Known Allergies.  MEDICATIONS:  Current Outpatient Medications  Medication Sig Dispense Refill   acetaminophen (TYLENOL) 500 MG tablet Take 1,000 mg by mouth every 6 (six) hours as needed for mild pain.     amLODipine (NORVASC) 5 MG tablet Take 1 tablet by mouth daily.     diclofenac Sodium (VOLTAREN) 1 % GEL Apply 2 g topically 2 (two) times daily as needed (pain).     gabapentin (NEURONTIN) 100 MG capsule Take 1 capsule (100 mg total) by mouth 3 (three) times daily. 90 capsule 2   latanoprost (XALATAN) 0.005 % ophthalmic solution Place 1 drop into both eyes at bedtime.     lidocaine-prilocaine (EMLA) cream Apply 1 Application topically as needed. Apply 5ml to Port-a-Cath site 45 mins to 1 hr before port-a-cath access. 30 g 0   lipase/protease/amylase (CREON) 12000-38000 units CPEP capsule Take 1 capsule (12,000 Units total) by mouth 3 (three) times daily with meals. 270 capsule 2   ondansetron (ZOFRAN) 8 MG tablet Take 1 tablet (8 mg total) by mouth every 8 (eight) hours as needed for nausea or vomiting. 30 tablet 2   pantoprazole (PROTONIX) 40 MG tablet Take 1 tablet (40 mg total) by mouth daily. 90 tablet 1   polyethylene glycol (MIRALAX / GLYCOLAX) 17 g packet Take 17  g by mouth 2 (two) times daily. 14 each 0   prochlorperazine (COMPAZINE) 10 MG tablet Take 1 tablet (10 mg total) by mouth every 6 (six) hours as needed for nausea or vomiting. 30 tablet 2   No current facility-administered medications for this visit.    PHYSICAL EXAMINATION: ECOG PERFORMANCE STATUS: 1 - Symptomatic but completely ambulatory  Vitals:   08/16/22 0800  BP: 113/67  Pulse: (!) 57  Resp: 17  Temp: 98 F (36.7 C)  SpO2: 100%   Wt Readings from Last 3 Encounters:  08/16/22 234 lb (106.1 kg)  07/27/22 228 lb 2 oz (103.5 kg)  07/12/22 229 lb 9.6 oz (104.1 kg)     GENERAL:alert, no distress and comfortable SKIN: skin color normal, no rashes or significant  lesions EYES: normal, Conjunctiva are pink and non-injected, sclera clear  NEURO: alert & oriented x 3 with fluent speech  LABORATORY DATA:  I have reviewed the data as listed    Latest Ref Rng & Units 08/16/2022    7:38 AM 07/27/2022    8:45 AM 07/12/2022    9:00 AM  CBC  WBC 4.0 - 10.5 K/uL 6.1  6.5  6.3   Hemoglobin 12.0 - 15.0 g/dL 16.1  09.6  04.5   Hematocrit 36.0 - 46.0 % 30.9  32.3  32.5   Platelets 150 - 400 K/uL 141  92  76         Latest Ref Rng & Units 08/16/2022    7:38 AM 07/27/2022    8:45 AM 07/12/2022    9:00 AM  CMP  Glucose 70 - 99 mg/dL 93  86  97   BUN 8 - 23 mg/dL 8  7  7    Creatinine 0.44 - 1.00 mg/dL 4.09  8.11  9.14   Sodium 135 - 145 mmol/L 140  138  140   Potassium 3.5 - 5.1 mmol/L 3.8  4.1  4.1   Chloride 98 - 111 mmol/L 110  108  109   CO2 22 - 32 mmol/L 26  26  27    Calcium 8.9 - 10.3 mg/dL 9.9  78.2  95.6   Total Protein 6.5 - 8.1 g/dL 6.7  7.6  6.9   Total Bilirubin 0.3 - 1.2 mg/dL 0.8  0.7  0.6   Alkaline Phos 38 - 126 U/L 110  104  116   AST 15 - 41 U/L 63  60  67   ALT 0 - 44 U/L 22  23  29        RADIOGRAPHIC STUDIES: I have personally reviewed the radiological images as listed and agreed with the findings in the report. No results found.    Orders Placed This Encounter  Procedures   CBC with Differential (Cancer Center Only)    Standing Status:   Future    Standing Expiration Date:   08/23/2023   CMP (Cancer Center only)    Standing Status:   Future    Standing Expiration Date:   08/23/2023   T4    Standing Status:   Future    Standing Expiration Date:   08/23/2023   TSH    Standing Status:   Future    Standing Expiration Date:   08/23/2023   Magnesium    Standing Status:   Future    Standing Expiration Date:   08/23/2023   CBC with Differential (Cancer Center Only)    Standing Status:   Future    Standing Expiration Date:  08/30/2023   CMP (Cancer Center only)    Standing Status:   Future    Standing Expiration Date:    08/30/2023   Magnesium    Standing Status:   Future    Standing Expiration Date:   08/30/2023   CBC with Differential (Cancer Center Only)    Standing Status:   Future    Standing Expiration Date:   09/13/2023   CMP (Cancer Center only)    Standing Status:   Future    Standing Expiration Date:   09/13/2023   Magnesium    Standing Status:   Future    Standing Expiration Date:   09/13/2023   CBC with Differential (Cancer Center Only)    Standing Status:   Future    Standing Expiration Date:   09/20/2023   CMP (Cancer Center only)    Standing Status:   Future    Standing Expiration Date:   09/20/2023   Magnesium    Standing Status:   Future    Standing Expiration Date:   09/20/2023   All questions were answered. The patient knows to call the clinic with any problems, questions or concerns. No barriers to learning was detected. The total time spent in the appointment was 40 minutes.     Malachy Mood, MD 08/16/2022   Carolin Coy, CMA, am acting as scribe for Malachy Mood, MD.   I have reviewed the above documentation for accuracy and completeness, and I agree with the above.

## 2022-08-15 NOTE — Assessment & Plan Note (Signed)
-  f/u with GI, will watch her LFTs closely on chemo     

## 2022-08-15 NOTE — Assessment & Plan Note (Signed)
Stage IVA (cT3, cN1, cM0)  with diffuse liver mets, unresectable  -Liver cirrhosis and a 2.5cm mass was found in 07/2019 but she did not follow up -due to rising AFP, she had MRI on 02/06/2022 which showed extensive liver lesions (largest 13cm) throughout both lobes with marked progression from prior MRI; enlarged upper abdominal lymph nodes, no distant mets -she started Durvaluamb every 4 weeks, and Tremelimumab  once on day 1 of cycle 1, on 03/21/2022.  -Although she had typical image findings of HCC, she underwent liver biopsy since CA 19-9 was also elevated  -liver biopsy showed well to moderately differentiated CK7 positive adenocarcinoma and her EGD was negative for upper GI tract malignancy. Tumor cells were also positive for Glycipan 3, supporting mixed HCC and cholagiocarcinoma. -she started GEMOX (cisplatin is contraindicated due to her chronic kidney disease) and continued durvalumab on 05/10/22, she has been tolerating treatment well overall  -restaging MRI from 08/01/22 showed disease progression in liver. I reviewed her scan myself and discussed with radiologist (addendum was made today). I personally reviewed with pt also  -her tumor marker AFP has significantly increased lately, but her CA 19.9 is trending down.  -Her renal function has improved lately.  I recommend to change her treatment to cisplatin, gemcitabine, atezolizumab and bevacizumab, started in 2 weeks

## 2022-08-15 NOTE — Assessment & Plan Note (Signed)
-  will monitor renal function closely during chemo    -improved lately

## 2022-08-16 ENCOUNTER — Other Ambulatory Visit: Payer: Self-pay

## 2022-08-16 ENCOUNTER — Encounter: Payer: Self-pay | Admitting: Hematology

## 2022-08-16 ENCOUNTER — Inpatient Hospital Stay: Payer: 59

## 2022-08-16 ENCOUNTER — Inpatient Hospital Stay: Payer: 59 | Attending: Hematology

## 2022-08-16 ENCOUNTER — Inpatient Hospital Stay (HOSPITAL_BASED_OUTPATIENT_CLINIC_OR_DEPARTMENT_OTHER): Payer: 59 | Admitting: Hematology

## 2022-08-16 VITALS — BP 113/67 | HR 57 | Temp 98.0°F | Resp 17 | Ht 65.0 in | Wt 234.0 lb

## 2022-08-16 DIAGNOSIS — C220 Liver cell carcinoma: Secondary | ICD-10-CM | POA: Diagnosis present

## 2022-08-16 DIAGNOSIS — I129 Hypertensive chronic kidney disease with stage 1 through stage 4 chronic kidney disease, or unspecified chronic kidney disease: Secondary | ICD-10-CM | POA: Insufficient documentation

## 2022-08-16 DIAGNOSIS — Z95828 Presence of other vascular implants and grafts: Secondary | ICD-10-CM

## 2022-08-16 DIAGNOSIS — C221 Intrahepatic bile duct carcinoma: Secondary | ICD-10-CM

## 2022-08-16 DIAGNOSIS — Z79899 Other long term (current) drug therapy: Secondary | ICD-10-CM | POA: Diagnosis not present

## 2022-08-16 DIAGNOSIS — N1831 Chronic kidney disease, stage 3a: Secondary | ICD-10-CM | POA: Diagnosis not present

## 2022-08-16 DIAGNOSIS — K7031 Alcoholic cirrhosis of liver with ascites: Secondary | ICD-10-CM | POA: Diagnosis not present

## 2022-08-16 DIAGNOSIS — R978 Other abnormal tumor markers: Secondary | ICD-10-CM | POA: Insufficient documentation

## 2022-08-16 DIAGNOSIS — N183 Chronic kidney disease, stage 3 unspecified: Secondary | ICD-10-CM | POA: Insufficient documentation

## 2022-08-16 DIAGNOSIS — I4891 Unspecified atrial fibrillation: Secondary | ICD-10-CM | POA: Diagnosis not present

## 2022-08-16 LAB — CMP (CANCER CENTER ONLY)
ALT: 22 U/L (ref 0–44)
AST: 63 U/L — ABNORMAL HIGH (ref 15–41)
Albumin: 2.6 g/dL — ABNORMAL LOW (ref 3.5–5.0)
Alkaline Phosphatase: 110 U/L (ref 38–126)
Anion gap: 4 — ABNORMAL LOW (ref 5–15)
BUN: 8 mg/dL (ref 8–23)
CO2: 26 mmol/L (ref 22–32)
Calcium: 9.9 mg/dL (ref 8.9–10.3)
Chloride: 110 mmol/L (ref 98–111)
Creatinine: 1.06 mg/dL — ABNORMAL HIGH (ref 0.44–1.00)
GFR, Estimated: 58 mL/min — ABNORMAL LOW (ref >60)
Glucose, Bld: 93 mg/dL (ref 70–99)
Potassium: 3.8 mmol/L (ref 3.5–5.1)
Sodium: 140 mmol/L (ref 135–145)
Total Bilirubin: 0.8 mg/dL (ref 0.3–1.2)
Total Protein: 6.7 g/dL (ref 6.5–8.1)

## 2022-08-16 LAB — CBC WITH DIFFERENTIAL (CANCER CENTER ONLY)
Abs Immature Granulocytes: 0.02 10*3/uL (ref 0.00–0.07)
Basophils Absolute: 0 10*3/uL (ref 0.0–0.1)
Basophils Relative: 0 %
Eosinophils Absolute: 0.1 10*3/uL (ref 0.0–0.5)
Eosinophils Relative: 2 %
HCT: 30.9 % — ABNORMAL LOW (ref 36.0–46.0)
Hemoglobin: 10.3 g/dL — ABNORMAL LOW (ref 12.0–15.0)
Immature Granulocytes: 0 %
Lymphocytes Relative: 39 %
Lymphs Abs: 2.4 10*3/uL (ref 0.7–4.0)
MCH: 35.4 pg — ABNORMAL HIGH (ref 26.0–34.0)
MCHC: 33.3 g/dL (ref 30.0–36.0)
MCV: 106.2 fL — ABNORMAL HIGH (ref 80.0–100.0)
Monocytes Absolute: 1.4 10*3/uL — ABNORMAL HIGH (ref 0.1–1.0)
Monocytes Relative: 23 %
Neutro Abs: 2.2 10*3/uL (ref 1.7–7.7)
Neutrophils Relative %: 36 %
Platelet Count: 141 10*3/uL — ABNORMAL LOW (ref 150–400)
RBC: 2.91 MIL/uL — ABNORMAL LOW (ref 3.87–5.11)
RDW: 21.2 % — ABNORMAL HIGH (ref 11.5–15.5)
WBC Count: 6.1 10*3/uL (ref 4.0–10.5)
nRBC: 0.3 % — ABNORMAL HIGH (ref 0.0–0.2)

## 2022-08-16 LAB — TSH: TSH: 4.001 u[IU]/mL (ref 0.350–4.500)

## 2022-08-16 MED ORDER — SODIUM CHLORIDE 0.9% FLUSH
10.0000 mL | Freq: Once | INTRAVENOUS | Status: AC
  Administered 2022-08-16: 10 mL

## 2022-08-16 MED ORDER — HEPARIN SOD (PORK) LOCK FLUSH 100 UNIT/ML IV SOLN
500.0000 [IU] | Freq: Once | INTRAVENOUS | Status: AC | PRN
  Administered 2022-08-16: 500 [IU]

## 2022-08-16 MED ORDER — SODIUM CHLORIDE 0.9% FLUSH
10.0000 mL | Freq: Once | INTRAVENOUS | Status: AC | PRN
  Administered 2022-08-16: 10 mL

## 2022-08-16 NOTE — Progress Notes (Signed)
Pharmacist Chemotherapy Monitoring - Initial Assessment    Anticipated start date: 08/23/22   The following has been reviewed per standard work regarding the patient's treatment regimen: The patient's diagnosis, treatment plan and drug doses, and organ/hematologic function Lab orders and baseline tests specific to treatment regimen  The treatment plan start date, drug sequencing, and pre-medications Prior authorization status  Patient's documented medication list, including drug-drug interaction screen and prescriptions for anti-emetics and supportive care specific to the treatment regimen The drug concentrations, fluid compatibility, administration routes, and timing of the medications to be used The patient's access for treatment and lifetime cumulative dose history, if applicable  The patient's medication allergies and previous infusion related reactions, if applicable   Changes made to treatment plan:  drug offset times  Follow up needed:  Pending authorization for treatment    Ebony Hail, Pharm.D., CPP 08/16/2022@1 :52 PM

## 2022-08-18 ENCOUNTER — Other Ambulatory Visit: Payer: Self-pay

## 2022-08-18 LAB — CANCER ANTIGEN 19-9: CA 19-9: 421 U/mL — ABNORMAL HIGH (ref 0–35)

## 2022-08-18 LAB — AFP TUMOR MARKER: AFP, Serum, Tumor Marker: 1538 ng/mL — ABNORMAL HIGH (ref 0.0–9.2)

## 2022-08-22 ENCOUNTER — Other Ambulatory Visit: Payer: Self-pay

## 2022-08-22 ENCOUNTER — Inpatient Hospital Stay: Payer: 59 | Admitting: Nurse Practitioner

## 2022-08-22 ENCOUNTER — Inpatient Hospital Stay: Payer: 59

## 2022-08-23 ENCOUNTER — Inpatient Hospital Stay: Payer: 59

## 2022-08-24 ENCOUNTER — Inpatient Hospital Stay: Payer: 59

## 2022-08-24 ENCOUNTER — Telehealth: Payer: Self-pay

## 2022-08-24 ENCOUNTER — Inpatient Hospital Stay: Payer: 59 | Admitting: Hematology

## 2022-08-24 NOTE — Telephone Encounter (Signed)
LVM for pt and spouse stating that Dr. Latanya Maudlin office needs for pt to come by the [facility] to complete an authorization for her insurance stating that Dr. Latanya Maudlin office can speak on behalf of pt's cancer treatment in order to get pt's tx for [facility] approved through P2P with insurance.  Instructed pt or spouse to contact Dr. Latanya Maudlin office when they receive this message to state what day and time can they come to sign the forms so Santiago Glad, NP can complete the pt's P2P.  Awaiting pt's response.

## 2022-08-25 ENCOUNTER — Other Ambulatory Visit: Payer: Self-pay | Admitting: Hematology

## 2022-08-29 ENCOUNTER — Other Ambulatory Visit: Payer: Self-pay

## 2022-08-29 NOTE — Assessment & Plan Note (Signed)
-  f/u with GI, will watch her LFTs closely on chemo     

## 2022-08-29 NOTE — Assessment & Plan Note (Signed)
Stage IVA (cT3, cN1, cM0)  with diffuse liver mets, unresectable  -Liver cirrhosis and a 2.5cm mass was found in 07/2019 but she did not follow up -due to rising AFP, she had MRI on 02/06/2022 which showed extensive liver lesions (largest 13cm) throughout both lobes with marked progression from prior MRI; enlarged upper abdominal lymph nodes, no distant mets -she started Durvaluamb every 4 weeks, and Tremelimumab 300mg  once on day 1 of cycle 1, on 03/21/2022.  -Although she had typical image findings of HCC, she underwent liver biopsy since CA 19-9 was also elevated  -liver biopsy showed well to moderately differentiated CK7 positive adenocarcinoma and her EGD was negative for upper GI tract malignancy. Tumor cells were also positive for Glycipan 3, supporting mixed HCC and cholagiocarcinoma. -she started GEMOX (cisplatin is contraindicated due to her chronic kidney disease) and continued durvalumab on 05/10/22, she has been tolerating treatment well overall  -restaging MRI from 08/01/22 showed disease progression in liver. I reviewed her scan myself and discussed with radiologist (addendum was made today). I personally reviewed with pt also  -her tumor marker AFP has significantly increased lately, but her CA 19.9 is trending down.  -Her renal function has improved lately.  I recommend to change her treatment to cisplatin, gemcitabine, atezolizumab and bevacizumab. Her insurance initially denied her therapy, but approved after appeal. Plan to start today

## 2022-08-30 ENCOUNTER — Inpatient Hospital Stay: Payer: 59

## 2022-08-30 ENCOUNTER — Inpatient Hospital Stay: Payer: 59 | Attending: Hematology | Admitting: Nurse Practitioner

## 2022-08-30 ENCOUNTER — Encounter: Payer: Self-pay | Admitting: Nurse Practitioner

## 2022-08-30 VITALS — BP 120/67 | HR 59 | Temp 97.7°F | Resp 18 | Wt 229.3 lb

## 2022-08-30 DIAGNOSIS — Z5189 Encounter for other specified aftercare: Secondary | ICD-10-CM | POA: Insufficient documentation

## 2022-08-30 DIAGNOSIS — R600 Localized edema: Secondary | ICD-10-CM | POA: Insufficient documentation

## 2022-08-30 DIAGNOSIS — K863 Pseudocyst of pancreas: Secondary | ICD-10-CM | POA: Diagnosis not present

## 2022-08-30 DIAGNOSIS — N1831 Chronic kidney disease, stage 3a: Secondary | ICD-10-CM

## 2022-08-30 DIAGNOSIS — Z79899 Other long term (current) drug therapy: Secondary | ICD-10-CM | POA: Diagnosis not present

## 2022-08-30 DIAGNOSIS — R590 Localized enlarged lymph nodes: Secondary | ICD-10-CM | POA: Insufficient documentation

## 2022-08-30 DIAGNOSIS — C221 Intrahepatic bile duct carcinoma: Secondary | ICD-10-CM

## 2022-08-30 DIAGNOSIS — K5900 Constipation, unspecified: Secondary | ICD-10-CM | POA: Diagnosis not present

## 2022-08-30 DIAGNOSIS — I8510 Secondary esophageal varices without bleeding: Secondary | ICD-10-CM | POA: Insufficient documentation

## 2022-08-30 DIAGNOSIS — K5730 Diverticulosis of large intestine without perforation or abscess without bleeding: Secondary | ICD-10-CM | POA: Diagnosis not present

## 2022-08-30 DIAGNOSIS — I700 Atherosclerosis of aorta: Secondary | ICD-10-CM | POA: Diagnosis not present

## 2022-08-30 DIAGNOSIS — I129 Hypertensive chronic kidney disease with stage 1 through stage 4 chronic kidney disease, or unspecified chronic kidney disease: Secondary | ICD-10-CM | POA: Diagnosis not present

## 2022-08-30 DIAGNOSIS — R7401 Elevation of levels of liver transaminase levels: Secondary | ICD-10-CM | POA: Diagnosis not present

## 2022-08-30 DIAGNOSIS — K7031 Alcoholic cirrhosis of liver with ascites: Secondary | ICD-10-CM | POA: Insufficient documentation

## 2022-08-30 DIAGNOSIS — M7989 Other specified soft tissue disorders: Secondary | ICD-10-CM | POA: Insufficient documentation

## 2022-08-30 DIAGNOSIS — G629 Polyneuropathy, unspecified: Secondary | ICD-10-CM | POA: Diagnosis not present

## 2022-08-30 DIAGNOSIS — I4891 Unspecified atrial fibrillation: Secondary | ICD-10-CM | POA: Diagnosis not present

## 2022-08-30 DIAGNOSIS — C220 Liver cell carcinoma: Secondary | ICD-10-CM | POA: Diagnosis not present

## 2022-08-30 DIAGNOSIS — N183 Chronic kidney disease, stage 3 unspecified: Secondary | ICD-10-CM | POA: Diagnosis not present

## 2022-08-30 DIAGNOSIS — Z5111 Encounter for antineoplastic chemotherapy: Secondary | ICD-10-CM | POA: Insufficient documentation

## 2022-08-30 DIAGNOSIS — K859 Acute pancreatitis without necrosis or infection, unspecified: Secondary | ICD-10-CM | POA: Diagnosis not present

## 2022-08-30 DIAGNOSIS — R772 Abnormality of alphafetoprotein: Secondary | ICD-10-CM | POA: Insufficient documentation

## 2022-08-30 DIAGNOSIS — Z95828 Presence of other vascular implants and grafts: Secondary | ICD-10-CM

## 2022-08-30 LAB — CBC WITH DIFFERENTIAL (CANCER CENTER ONLY)
Abs Immature Granulocytes: 0.03 10*3/uL (ref 0.00–0.07)
Basophils Absolute: 0 10*3/uL (ref 0.0–0.1)
Basophils Relative: 0 %
Eosinophils Absolute: 0.3 10*3/uL (ref 0.0–0.5)
Eosinophils Relative: 5 %
HCT: 33.6 % — ABNORMAL LOW (ref 36.0–46.0)
Hemoglobin: 11.3 g/dL — ABNORMAL LOW (ref 12.0–15.0)
Immature Granulocytes: 0 %
Lymphocytes Relative: 34 %
Lymphs Abs: 2.5 10*3/uL (ref 0.7–4.0)
MCH: 36.3 pg — ABNORMAL HIGH (ref 26.0–34.0)
MCHC: 33.6 g/dL (ref 30.0–36.0)
MCV: 108 fL — ABNORMAL HIGH (ref 80.0–100.0)
Monocytes Absolute: 1 10*3/uL (ref 0.1–1.0)
Monocytes Relative: 13 %
Neutro Abs: 3.5 10*3/uL (ref 1.7–7.7)
Neutrophils Relative %: 48 %
Platelet Count: 107 10*3/uL — ABNORMAL LOW (ref 150–400)
RBC: 3.11 MIL/uL — ABNORMAL LOW (ref 3.87–5.11)
RDW: 18.5 % — ABNORMAL HIGH (ref 11.5–15.5)
WBC Count: 7.4 10*3/uL (ref 4.0–10.5)
nRBC: 0 % (ref 0.0–0.2)

## 2022-08-30 LAB — CMP (CANCER CENTER ONLY)
ALT: 20 U/L (ref 0–44)
AST: 57 U/L — ABNORMAL HIGH (ref 15–41)
Albumin: 2.7 g/dL — ABNORMAL LOW (ref 3.5–5.0)
Alkaline Phosphatase: 115 U/L (ref 38–126)
Anion gap: 3 — ABNORMAL LOW (ref 5–15)
BUN: 11 mg/dL (ref 8–23)
CO2: 29 mmol/L (ref 22–32)
Calcium: 10.3 mg/dL (ref 8.9–10.3)
Chloride: 107 mmol/L (ref 98–111)
Creatinine: 1.07 mg/dL — ABNORMAL HIGH (ref 0.44–1.00)
GFR, Estimated: 57 mL/min — ABNORMAL LOW (ref >60)
Glucose, Bld: 92 mg/dL (ref 70–99)
Potassium: 3.8 mmol/L (ref 3.5–5.1)
Sodium: 139 mmol/L (ref 135–145)
Total Bilirubin: 0.9 mg/dL (ref 0.3–1.2)
Total Protein: 6.7 g/dL (ref 6.5–8.1)

## 2022-08-30 LAB — TSH: TSH: 3.015 u[IU]/mL (ref 0.350–4.500)

## 2022-08-30 LAB — MAGNESIUM: Magnesium: 1.8 mg/dL (ref 1.7–2.4)

## 2022-08-30 MED ORDER — HEPARIN SOD (PORK) LOCK FLUSH 100 UNIT/ML IV SOLN
500.0000 [IU] | Freq: Once | INTRAVENOUS | Status: AC
  Administered 2022-08-30: 500 [IU]

## 2022-08-30 MED ORDER — SODIUM CHLORIDE 0.9% FLUSH
10.0000 mL | Freq: Once | INTRAVENOUS | Status: AC
  Administered 2022-08-30: 10 mL

## 2022-08-30 MED FILL — Fosaprepitant Dimeglumine For IV Infusion 150 MG (Base Eq): INTRAVENOUS | Qty: 5 | Status: AC

## 2022-08-30 NOTE — Progress Notes (Signed)
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  Underlying hepatic cirrhosis suspected.   2.5 cm indeterminate central hypoechoic hepatic lesion. See above comment.   02/22/2021 Imaging   CLINICAL DATA:  Right-sided abdominal pain.   EXAM: ABDOMEN ULTRASOUND COMPLETE  IMPRESSION: 1. Cirrhosis and small ascites. 2. Gallbladder sludge. No sonographic evidence of acute cholecystitis.   02/22/2021 Imaging   CLINICAL DATA:  Abdominal pain.  Concern for diverticulitis.   EXAM: CT ABDOMEN AND PELVIS WITH CONTRAST  IMPRESSION: 1. Acute pancreatitis. No abscess or pseudocyst. 2. Cirrhosis with small ascites. 3. Colonic diverticulosis. No bowel obstruction. Normal appendix. 4. Aortic Atherosclerosis (ICD10-I70.0).   03/02/2021 Tumor Marker   CA 19-9 = 45 (^) AFP = 11 (^)   03/03/2021 Imaging   CLINICAL DATA:  Pancreatitis, worsening abdominal pain.   EXAM: CT ABDOMEN AND PELVIS WITH CONTRAST  IMPRESSION: 1. Multiple suspicious lesions in Connie right hepatic lobe. [name] has underlying cirrhosis and findings are concerning for  multifocal hepatocellular carcinoma. Metastatic disease is also in Connie differential diagnosis. Recommend MRI of Connie abdomen with and without contrast. If Connie [name] cannot tolerate a MRI, consider a multiphase liver CT. 2. Increased inflammatory changes in Connie upper abdomen compatible with pancreatitis and a large developing pseudocyst. This is developing pseudocyst measures up to 14 cm in size. 3. Cirrhosis with evidence of portal hypertension demonstrated by esophageal varices and varices draining into Connie IVC. 4. Indeterminate 3 mm pulmonary nodule in Connie lingula. Management of this pulmonary nodule depends on Connie etiology of Connie liver lesions.   03/04/2021 Imaging   EXAM: MRI ABDOMEN WITHOUT AND WITH CONTRAST (INCLUDING MRCP)  IMPRESSION: 1. Multiple enhancing lesions are noted throughout Connie right lobe of Connie liver, as detailed above. These have indeterminate imaging characteristics. Given Connie background of hepatic cirrhosis, Connie possibility of multifocal infiltrative hepatocellular carcinoma should be considered, although none of these lesions meet definite imaging criteria to establish that diagnosis. Correlation with AFP level is recommended. Other differential considerations include malignant and benign etiologies such as fibrolamellar hepatocellular carcinoma and multifocal focal nodular hyperplasia (FNH). Consideration for repeat abdominal MRI with and without IV gadolinium (Eovist) is also suggested, although biopsy may ultimately be required to establish a tissue diagnosis. 2. Pancreatitis with large infiltrative pancreatic pseudocysts in Connie root of Connie small bowel mesentery. 3. Trace volume of perihepatic ascites.   09/22/2021 Tumor Marker   CA 19-9 = 190 (^) AFP = 124 (^)   01/19/2022 Tumor Marker   CA 19-9 = 816 (^) AFP = 307 (^)   02/06/2022 Imaging   EXAM: MRI ABDOMEN WITHOUT AND WITH CONTRAST (INCLUDING MRCP)  IMPRESSION: 1. Morphologic features of Connie liver  compatible with cirrhosis. 2.  Extensive enhancing liver lesions are identified throughout both lobes of liver which demonstrate marked progression compared with Connie previous exam. Enhancing lesions demonstrate washout on Connie delayed images. In Connie setting of cirrhosis with progressively elevated alpha fetoprotein levels imaging findings are highly worrisome for multifocal hepatocellular carcinoma. 3. Enlarged upper abdominal lymph nodes identified. Cannot exclude underlying nodal metastasis. 4. There are several lesions within Connie central mesentery in Connie distribution of previous large pseudo cysts. Connie largest measures 5.0 x 3.1 cm. This is favored to represent sequelae of prior hemorrhagic pancreatitis with pseudocyst formation.   02/23/2022 Initial Diagnosis   Hepatocellular carcinoma (HCC)   03/20/2022 Cancer Staging   Staging form: Liver, AJCC 8th Edition - Clinical stage from 03/20/2022: Stage IVA (cT3, cN1, cM0) - Signed by Malachy Mood, MD on 03/20/2022   03/21/2022 - 04/20/2022 Chemotherapy   [name] is on Treatment Plan : LUNG NSCLC Durvalumab (1500) q28d     04/04/2022 Pathology Results   A. LIVER, RIGHT LOBE, BIOPSY: Well to moderately differentiated CK7 positive adenocarcinoma (see comment)  COMMENT:  Given Connie cytohistomorphology and this immunohistochemical pattern of cytokeratin 7 positivity, Connie differential diagnosis would include a pancreaticobiliary or upper gastrointestinal intestinal tract primary or possibly a lung primary (not favored).    05/11/2022 - 05/11/2022 Chemotherapy   [name] is on Treatment Plan : BLADDER Durvalumab (10) q14d     Cholangiocarcinoma of liver (HCC)  04/23/2022 Initial Diagnosis   Cholangiocarcinoma of liver (HCC)   05/11/2022 - 05/11/2022 Chemotherapy   [name] is on Treatment Plan : BILIARY TRACT Cisplatin + Gemcitabine D1,8 q21d     05/11/2022 - 07/27/2022 Chemotherapy   [name] is on Treatment Plan : PANCREAS GemOx q14d     08/30/2022  -  Chemotherapy   [name] is on Treatment Plan : BILIARY TRACT Cisplatin + Gemcitabine D1,8 + Durvalumab (1500) D1 q21d / Durvalumab (1500) q28d        CURRENT THERAPY: Pending second line Cisplatin/Gemcitabine + Atezolizumab/Bevacizumab q21 days  INTERVAL HISTORY Connie [name] returns with Connie [name] spouse for follow up.  Last chemo given 3/28, and last visit 4/13.  Connie [name] has been doing well in Connie interim, still a little tired but out of bed and active at home.  Connie [name] has stable equal leg swelling; when Connie [name] rests Connie [name] legs are down.  Eating and drinking well.  Bowels moving without difficulty.  Neuropathy has improved with gabapentin.  Denies pain, nausea/vomiting, or any other new specific complaints.  ROS  All other systems reviewed and negative  Past Medical History:  Diagnosis Date   Alcohol abuse    Alcoholic cirrhosis of liver (HCC)    Atrial fibrillation with RVR (HCC) 08/02/2019   Cataract    Chronic kidney disease    Helicobacter pylori gastritis 08/07/2019   Hepatitis C antibody positive in blood - negative RNA    Hypertension    liver ca 01/2022   bile duct ca 05/2022   Pancreatic pseudocyst 03/04/2021     Past Surgical History:  Procedure Laterality Date   BIOPSY  08/03/2019   Procedure: BIOPSY;  Surgeon: Iva Boop, MD;  Location: [facility] ENDOSCOPY;  Service: Endoscopy;;   ESOPHAGOGASTRODUODENOSCOPY (EGD) WITH PROPOFOL N/A 08/03/2019   Procedure: ESOPHAGOGASTRODUODENOSCOPY (EGD) WITH PROPOFOL;  Surgeon: Iva Boop, MD;  Location[facility] ENDOSCOPY;  Service: Endoscopy;  Laterality: N/A;   IR IMAGING GUIDED PORT INSERTION  05/29/2022   IR RADIOLOGIST EVAL & MGMT  02/14/2022   IR RADIOLOGIST EVAL & MGMT  03/14/2022  Outpatient Encounter Medications as of 08/30/2022  Medication Sig   acetaminophen (TYLENOL) 500 MG tablet Take 1,000 mg by mouth every 6 (six) hours as needed for mild pain.   amLODipine (NORVASC) 5 MG tablet Take 1 tablet by mouth daily.   diclofenac Sodium (VOLTAREN)  1 % GEL Apply 2 g topically 2 (two) times daily as needed (pain).   gabapentin (NEURONTIN) 100 MG capsule Take 1 capsule (100 mg total) by mouth 3 (three) times daily.   latanoprost (XALATAN) 0.005 % ophthalmic solution Place 1 drop into both eyes at bedtime.   lidocaine-prilocaine (EMLA) cream Apply 1 Application topically as needed. Apply 5ml to Port-a-Cath site 45 mins to 1 hr before port-a-cath access.   lipase/protease/amylase (CREON) 12000-38000 units CPEP capsule Take 1 capsule (12,000 Units total) by mouth 3 (three) times daily with meals.   ondansetron (ZOFRAN) 8 MG tablet Take 1 tablet (8 mg total) by mouth every 8 (eight) hours as needed for nausea or vomiting.   pantoprazole (PROTONIX) 40 MG tablet Take 1 tablet (40 mg total) by mouth daily.   polyethylene glycol (MIRALAX / GLYCOLAX) 17 g packet Take 17 g by mouth 2 (two) times daily.   prochlorperazine (COMPAZINE) 10 MG tablet Take 1 tablet (10 mg total) by mouth every 6 (six) hours as needed for nausea or vomiting.   No facility-administered encounter medications on file as of 08/30/2022.     Today's Vitals   08/30/22 0855  BP: 120/67  Pulse: (!) 59  Resp: 18  Temp: 97.7 F (36.5 C)  SpO2: 99%  Weight: 229 lb 4.8 oz (104 kg)   Body mass index is 38.16 kg/m.   PHYSICAL EXAM GENERAL:alert, no distress and comfortable SKIN: no rash  EYES: sclera clear LUNGS: clear with normal breathing effort HEART: regular rate & rhythm, bilateral symmetrical lower extremity edema ABDOMEN: abdomen soft, non-tender and normal bowel sounds NEURO: alert & oriented x 3 with fluent speech, no focal motor/sensory deficits PAC covered with Band-Aid  CBC    Component Value Date/Time   WBC 7.4 08/30/2022 0758   WBC 8.2 06/28/2022 1133   RBC 3.11 (L) 08/30/2022 0758   HGB 11.3 (L) 08/30/2022 0758   HCT 33.6 (L) 08/30/2022 0758   PLT 107 (L) 08/30/2022 0758   MCV 108.0 (H) 08/30/2022 0758   MCH 36.3 (H) 08/30/2022 0758   MCHC 33.6  08/30/2022 0758   RDW 18.5 (H) 08/30/2022 0758   LYMPHSABS 2.5 08/30/2022 0758   MONOABS 1.0 08/30/2022 0758   EOSABS 0.3 08/30/2022 0758   BASOSABS 0.0 08/30/2022 0758     CMP     Component Value Date/Time   NA 139 08/30/2022 0758   K 3.8 08/30/2022 0758   CL 107 08/30/2022 0758   CO2 29 08/30/2022 0758   GLUCOSE 92 08/30/2022 0758   BUN 11 08/30/2022 0758   CREATININE 1.07 (H) 08/30/2022 0758   CALCIUM 10.3 08/30/2022 0758   CALCIUM 11.1 (H) 03/30/2022 1051   PROT 6.7 08/30/2022 0758   ALBUMIN 2.7 (L) 08/30/2022 0758   AST 57 (H) 08/30/2022 0758   ALT 20 08/30/2022 0758   ALKPHOS 115 08/30/2022 0758   BILITOT 0.9 08/30/2022 0758   GFRNONAA 57 (L) 08/30/2022 0758   GFRAA 45 (L) 08/04/2019 0337     ASSESSMENT & PLAN:Connie [name] is a 67 y.o. female with    Next HCC and bile duct cancer suspected Hepatocellular carcinoma (HCC) -Liver cirrhosis and a 2.5cm mass was found in 07/2019 but  Connie [name] did not follow up -due to rising AFP, Connie [name] had MRI on 02/06/2022 which showed extensive liver lesions (largest 13cm) throughout both lobes with marked progression from prior MRI; enlarged upper abdominal lymph nodes, no distant mets -Connie [name] started Durvaluamb every 4 weeks, and Tremelimumab 300mg  once on day 1 of cycle 1, on 03/21/2022.  -Although Connie [name] had typical image findings of HCC, Connie [name] underwent liver biopsy since CA 19-9 was also elevated; biopsy showed well to moderately differentiated CK7 positive adenocarcinoma; Connie IHC staining profile includes pancreaticobiliary or upper GI primary, and possibly lung but not favored. EGD was negative -Connie [name] has mixed HCC and cholangiocarcinoma features.  -Began GEMOX q2 weeks on 05/10/22 and continues durvalumab q4 weeks  -CT 06/28/22 showed stable disease; Connie [name] completed a total of 6 cycles Connie [name] AFP tumor marker has significantly increased, but CA 19-9 is trending down.  Restaging MRI 08/01/2022 showed disease progression in Connie liver -Connie [name] appears  stable.  Connie [name] has recovered from last GEMOX/Imfinzi.  Mild fatigue and intermittent neuropathy are stable.  Connie [name] has good performance status at home. -Labs reviewed, adequate to proceed with second line C1D1 cisplatin/gemcitabine + atezolizumab/bevacizumab as planned. Connie [name].  - We again reviewed Connie potential risk/benefit, goal, side effects, and symptom management.  Connie [name] will start tomorrow -Follow-up next week with cycle 1 day 8   Alcoholic cirrhosis of liver with ascites (HCC) -f/u with liver clinic NP Dawn Drazak  -Connie [name] also had HCV infection    Hypercalcemia -PTH level low, so not primary hyperparathyroidism  -rpPTH normal -Connie [name] received pamidronate x2, Ca normalized   PLAN: -Labs reviewed -Reviewed treatment regimen schedule, SE's, and symptom management -C1D1 cis/gem/atezo/beva tomorrow -Return for lab, f/up, and C1D8 cis/gem next week as scheduled -Avoid steroids while on immunotherapy -Reviewed Connie plan with Dr. Mosetta Putt    All questions were answered. Connie [name] knows to call Connie clinic with any problems, questions or concerns. No barriers to learning were detected. I spent 20 minutes counseling Connie [name] face to face. Connie total time spent in Connie appointment was 30 minutes and more than 50% was on counseling, review of test results, and coordination of care.   Santiago Glad, NP-C 08/30/2022

## 2022-08-31 ENCOUNTER — Other Ambulatory Visit (HOSPITAL_COMMUNITY): Payer: Self-pay

## 2022-08-31 ENCOUNTER — Encounter: Payer: Self-pay | Admitting: Hematology

## 2022-08-31 ENCOUNTER — Other Ambulatory Visit: Payer: Self-pay | Admitting: Hematology

## 2022-08-31 ENCOUNTER — Telehealth: Payer: Self-pay

## 2022-08-31 ENCOUNTER — Other Ambulatory Visit: Payer: Self-pay

## 2022-08-31 ENCOUNTER — Ambulatory Visit: Payer: 59

## 2022-08-31 ENCOUNTER — Other Ambulatory Visit: Payer: 59

## 2022-08-31 ENCOUNTER — Inpatient Hospital Stay: Payer: 59

## 2022-08-31 ENCOUNTER — Ambulatory Visit: Payer: 59 | Admitting: Nurse Practitioner

## 2022-08-31 VITALS — BP 131/62 | HR 75 | Temp 97.7°F | Resp 14 | Wt 230.5 lb

## 2022-08-31 DIAGNOSIS — C221 Intrahepatic bile duct carcinoma: Secondary | ICD-10-CM

## 2022-08-31 DIAGNOSIS — C220 Liver cell carcinoma: Secondary | ICD-10-CM | POA: Diagnosis not present

## 2022-08-31 LAB — TOTAL PROTEIN, URINE DIPSTICK: Protein, ur: NEGATIVE mg/dL

## 2022-08-31 MED ORDER — POTASSIUM CHLORIDE CRYS ER 20 MEQ PO TBCR
20.0000 meq | EXTENDED_RELEASE_TABLET | Freq: Once | ORAL | Status: AC
  Administered 2022-08-31: 20 meq via ORAL
  Filled 2022-08-31: qty 1

## 2022-08-31 MED ORDER — SODIUM CHLORIDE 0.9 % IV SOLN
25.0000 mg/m2 | Freq: Once | INTRAVENOUS | Status: AC
  Administered 2022-08-31: 55 mg via INTRAVENOUS
  Filled 2022-08-31: qty 55

## 2022-08-31 MED ORDER — POTASSIUM CHLORIDE IN NACL 20-0.9 MEQ/L-% IV SOLN
Freq: Once | INTRAVENOUS | Status: AC
  Filled 2022-08-31: qty 1000

## 2022-08-31 MED ORDER — SODIUM CHLORIDE 0.9 % IV SOLN
1000.0000 mg/m2 | Freq: Once | INTRAVENOUS | Status: AC
  Administered 2022-08-31: 2204 mg via INTRAVENOUS
  Filled 2022-08-31: qty 57.97

## 2022-08-31 MED ORDER — SODIUM CHLORIDE 0.9 % IV SOLN: Freq: Once | INTRAVENOUS | Status: AC

## 2022-08-31 MED ORDER — FUROSEMIDE 20 MG PO TABS: 20.0000 mg | ORAL_TABLET | Freq: Every day | ORAL | 0 refills | Status: DC

## 2022-08-31 MED ORDER — SODIUM CHLORIDE 0.9 % IV SOLN: Freq: Once | INTRAVENOUS | Status: DC

## 2022-08-31 MED ORDER — POTASSIUM CHLORIDE CRYS ER 20 MEQ PO TBCR
20.0000 meq | EXTENDED_RELEASE_TABLET | Freq: Two times a day (BID) | ORAL | 0 refills | Status: DC
  Filled 2022-08-31: qty 20, 10d supply, fill #0

## 2022-08-31 MED ORDER — SODIUM CHLORIDE 0.9 % IV SOLN
16.0000 mg | Freq: Once | INTRAVENOUS | Status: AC
  Administered 2022-08-31: 16 mg via INTRAVENOUS
  Filled 2022-08-31: qty 8

## 2022-08-31 MED ORDER — FUROSEMIDE 10 MG/ML IJ SOLN
10.0000 mg | Freq: Once | INTRAMUSCULAR | Status: AC
  Administered 2022-08-31: 10 mg via INTRAMUSCULAR
  Filled 2022-08-31: qty 2

## 2022-08-31 MED ORDER — SODIUM CHLORIDE 0.9 % IV SOLN
150.0000 mg | Freq: Once | INTRAVENOUS | Status: AC
  Administered 2022-08-31: 150 mg via INTRAVENOUS
  Filled 2022-08-31: qty 150

## 2022-08-31 MED ORDER — HEPARIN SOD (PORK) LOCK FLUSH 100 UNIT/ML IV SOLN
500.0000 [IU] | Freq: Once | INTRAVENOUS | Status: AC | PRN
  Administered 2022-08-31: 500 [IU]

## 2022-08-31 MED ORDER — SODIUM CHLORIDE 0.9 % IV SOLN
7.5000 mg/kg | Freq: Once | INTRAVENOUS | Status: AC
  Administered 2022-08-31: 800 mg via INTRAVENOUS
  Filled 2022-08-31: qty 32

## 2022-08-31 MED ORDER — POTASSIUM CHLORIDE CRYS ER 20 MEQ PO TBCR: 20.0000 meq | EXTENDED_RELEASE_TABLET | Freq: Two times a day (BID) | ORAL | 0 refills | Status: DC

## 2022-08-31 MED ORDER — MAGNESIUM SULFATE 2 GM/50ML IV SOLN
2.0000 g | Freq: Once | INTRAVENOUS | Status: AC
  Administered 2022-08-31: 2 g via INTRAVENOUS
  Filled 2022-08-31: qty 50

## 2022-08-31 MED ORDER — SODIUM CHLORIDE 0.9% FLUSH
10.0000 mL | INTRAVENOUS | Status: DC | PRN
  Administered 2022-08-31: 10 mL

## 2022-08-31 MED ORDER — SODIUM CHLORIDE 0.9 % IV SOLN
1200.0000 mg | Freq: Once | INTRAVENOUS | Status: AC
  Administered 2022-08-31: 1200 mg via INTRAVENOUS
  Filled 2022-08-31: qty 20

## 2022-08-31 MED ORDER — FUROSEMIDE 20 MG PO TABS
20.0000 mg | ORAL_TABLET | Freq: Every day | ORAL | 0 refills | Status: DC
  Filled 2022-08-31: qty 20, 20d supply, fill #0

## 2022-08-31 MED ORDER — FUROSEMIDE 10 MG/ML IJ SOLN
20.0000 mg | Freq: Once | INTRAMUSCULAR | Status: AC
  Administered 2022-08-31: 20 mg via INTRAMUSCULAR
  Filled 2022-08-31: qty 2

## 2022-08-31 NOTE — Progress Notes (Signed)
At 1000 Pt has c/o bilateral pain in lower extremities. This RN assessed Pt and found +2 pitting edema and blister like spots present. Pt has c/o pain when legs are touched. This RN made Dr. Mosetta Putt aware. This RN received V/O for Lasix 20 mg IV to be given in infusion. This RN educated Pt on lasix and MD recommendation. Pt verbalized understanding and was agreeable.   This RN confirmed with Dr. Mosetta Putt that Pt is to receive all post hydration fluids today. This RN received a V/O for Lasix 10 mg IV to be given if lower leg edema worsens during post hydration. At 1550 Pt c/o worsening tighness in lower legs and ankles, upon assessment edema still +2 and appears to be improving. This RN made MD aware. Per Dr. Mosetta Putt proceed with Lasix 10 mg IV. This RN received V/O for PO potassium chloride 20 mEq to be given in infusion.   Dr. Mosetta Putt sent in prescription for oral Lasix and potassium to be taken at home.  This RN educated Pt and Pt's family on newly prescribed home medications. Pt and family verbalized understanding and were agreeable.

## 2022-08-31 NOTE — Patient Instructions (Signed)
[place] [facility]  Discharge Instructions: Thank you for choosing [facility] to provide your oncology and hematology care.   If you have a lab appointment with the Cancer Center, please go directly to the Cancer Center and check in at the registration area.   Wear comfortable clothing and clothing appropriate for easy access to any Portacath or PICC line.   We strive to give you quality time with your provider. You may need to reschedule your appointment if you arrive late (15 or more minutes).  Arriving late affects you and other patients whose appointments are after yours.  Also, if you miss three or more appointments without notifying the office, you may be dismissed from the clinic at the provider's discretion.      For prescription refill requests, have your pharmacy contact our office and allow 72 hours for refills to be completed.    Today you received the following chemotherapy and/or immunotherapy agents: Atezolizumab (Tecentriq), Bevacizumab (MVASI), Gemcitabine (Gemzar), Cisplatin   To help prevent nausea and vomiting after your treatment, we encourage you to take your nausea medication as directed.  BELOW ARE SYMPTOMS THAT SHOULD BE REPORTED IMMEDIATELY: *FEVER GREATER THAN 100.4 F (38 C) OR HIGHER *CHILLS OR SWEATING *NAUSEA AND VOMITING THAT IS NOT CONTROLLED WITH YOUR NAUSEA MEDICATION *UNUSUAL SHORTNESS OF BREATH *UNUSUAL BRUISING OR BLEEDING *URINARY PROBLEMS (pain or burning when urinating, or frequent urination) *BOWEL PROBLEMS (unusual diarrhea, constipation, pain near the anus) TENDERNESS IN MOUTH AND THROAT WITH OR WITHOUT PRESENCE OF ULCERS (sore throat, sores in mouth, or a toothache) UNUSUAL RASH, SWELLING OR PAIN  UNUSUAL VAGINAL DISCHARGE OR ITCHING   Items with * indicate a potential emergency and should be followed up as soon as possible or go to the Emergency Department if any problems should occur.  Please  show the CHEMOTHERAPY ALERT CARD or IMMUNOTHERAPY ALERT CARD at check-in to the Emergency Department and triage nurse.  Should you have questions after your visit or need to cancel or reschedule your appointment, please contact [place] [facility]  Dept: [phone]  and follow the prompts.  Office hours are 8:00 a.m. to 4:30 p.m. Monday - Friday. Please note that voicemails left after 4:00 p.m. may not be returned until the following business day.  We are closed weekends and major holidays. You have access to a nurse at all times for urgent questions. Please call the main number to the clinic Dept: [phone] and follow the prompts.   For any non-urgent questions, you may also contact your provider using MyChart. We now offer e-Visits for anyone 8 and older to request care online for non-urgent symptoms. For details visit [email or web address].   Also download the MyChart app! Go to the app store, search "MyChart", open the app, select [place], and log in with your MyChart username and password.

## 2022-08-31 NOTE — Telephone Encounter (Signed)
Faxed back the Completed Appointment of Representative form for this patient to [facility] per Santiago Glad NP. Received fax conformation of receipt.

## 2022-09-01 ENCOUNTER — Inpatient Hospital Stay: Payer: 59

## 2022-09-01 ENCOUNTER — Telehealth: Payer: Self-pay

## 2022-09-01 LAB — T4: T4, Total: 9.9 ug/dL (ref 4.5–12.0)

## 2022-09-01 NOTE — Telephone Encounter (Signed)
Follow-up on new chemo regimen:  Spoke with pt via telephone.  Pt denied n/v but did have some diarrhea on 08/31/2022 when she got home from treatment.  Pt stated diarrhea has resolved and she's actually feeling fine.  Pt confirmed having antiemetics home prescriptions on hand.  Pt stated she's eating and drinking w/out complications.  Pt stated she's drinking Apple juice at this time but will drink a bottle water after she finishes the apple juice.  Recommended the pt start drinking Liquid IV, Pedialyte Rehydrate, or Gatorade for Rehydrate to remain hydrated.  Informed pt that the new chemo regimen can cause renal injury or failure; therefore, it's highly recommended that the pt drink enough fluids to flush out the kidneys.  Pt verbalized understanding and stated she will pickup one of these when she goes to the grocery store later today.  Pt asked if she could get a refill on the Furosemide that Dr. Mosetta Putt prescribed to the pt on 08/31/2022 because she was only given a 5 day supply.  Informed pt that a refill is not appropriate right now for this medication.  Pt confirmed that the edema in her lower extremities has greatly started to resolve.  Informed pt that Dr. Mosetta Putt will need to reassess her lower extremities and if they are still edematous, Dr. Mosetta Putt will either refill or recommend something.  Pt confirmed taking the K+ as prescribed by Dr. Mosetta Putt.  Explained to pt why the K+ was prescribed d/t Furosemide depletes the pt's K+ with can cause the pt's heart to beat irregularly.  Pt verbalized understanding and had no further questions or concerns at this time.

## 2022-09-06 ENCOUNTER — Other Ambulatory Visit: Payer: Self-pay

## 2022-09-06 MED FILL — Fosaprepitant Dimeglumine For IV Infusion 150 MG (Base Eq): INTRAVENOUS | Qty: 5 | Status: AC

## 2022-09-06 NOTE — Assessment & Plan Note (Signed)
-  f/u with GI, will watch her LFTs closely on chemo     

## 2022-09-06 NOTE — Assessment & Plan Note (Signed)
Stage IVA (cT3, cN1, cM0)  with diffuse liver mets, unresectable  -Liver cirrhosis and a 2.5cm mass was found in 07/2019 but she did not follow up -due to rising AFP, she had MRI on 02/06/2022 which showed extensive liver lesions (largest 13cm) throughout both lobes with marked progression from prior MRI; enlarged upper abdominal lymph nodes, no distant mets -she started Durvaluamb every 4 weeks, and Tremelimumab 300mg  once on day 1 of cycle 1, on 03/21/2022.  -Although she had typical image findings of HCC, she underwent liver biopsy since CA 19-9 was also elevated  -liver biopsy showed well to moderately differentiated CK7 positive adenocarcinoma and her EGD was negative for upper GI tract malignancy. Tumor cells were also positive for Glycipan 3, supporting mixed HCC and cholagiocarcinoma. -she started GEMOX (cisplatin is contraindicated due to her chronic kidney disease) and continued durvalumab on 05/10/22, she tolerated treatment well overall  -restaging MRI from 08/01/22 showed disease progression in liver.  -her tumor marker AFP has significantly increased lately, but her CA 19.9 is trending down.  -I have changed her treatment to cisplatin, gemcitabine, atezolizumab and bevacizumab, started on 08/30/2022.

## 2022-09-06 NOTE — Progress Notes (Unsigned)
[facility]   Telephone:(336) [phone] Fax:([phone]) [phone]   Clinic Follow up Note   Patient Care Team: Loura Back, NP as PCP - General (Nurse Practitioner) Thurmon Fair, MD as PCP - Cardiology (Cardiology) Annamarie Major, CRNP as Nurse Practitioner (Transplant Hepatology) Simonne Come, MD as Consulting Physician (Interventional Radiology) Malachy Mood, MD as Consulting Physician (Hematology and Oncology)  Date of Service:  09/06/2022  CHIEF COMPLAINT: f/u of follow-up of mixed cholangiocarcinoma and hepatocellular carcinoma  CURRENT THERAPY:  Cisplatin, gemcitabine on day 1 and 8, Atezo and bevacizumab on day 1, every 21 days  ASSESSMENT:  Connie [name] is a 67 y.o. 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  -Although she had typical image findings of HCC, she underwent liver biopsy since CA 19-9 was also elevated  -liver biopsy showed well to moderately differentiated CK7 positive adenocarcinoma and her EGD was negative for upper GI tract malignancy. Tumor cells were also positive for Glycipan 3, supporting mixed HCC and cholagiocarcinoma. -she started GEMOX (cisplatin is contraindicated due to her chronic kidney disease) and continued durvalumab on 05/10/22, she tolerated treatment well overall  -restaging MRI from 08/01/22 showed disease progression in liver.  -her tumor marker AFP has significantly increased lately, but her CA 19.9 is trending down.  -I have changed her treatment to cisplatin,  gemcitabine, atezolizumab and bevacizumab, started on 08/30/2022.  -She tolerated first cycle chemotherapy well, except significant leg edema.  We gave her IV Lasix during the infusion, and she took oral Lasix for 5 days after treatment.  Her leg edema has improved -If lab adequate adequate, will continue treatment cycle 1 day 8 today  CKD (chronic kidney disease) stage 3, GFR 30-59 ml/min (HCC) -will monitor renal function closely during chemo    -improved lately  -Will monitor closely, especially when she is on furosemide.    Alcoholic cirrhosis of liver with ascites (HCC) -f/u with GI, will watch her LFTs closely on chemo      PLAN: -Lab reviewed, CBC showed slightly worse pancytopenia but adequate for treatment, CMP still pending.  If creatinine is not getting worse, will proceed cycle 1 day 8 cisplatin and gemcitabine with slight dose reduction due to cytopenias. 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  Underlying hepatic cirrhosis suspected.   2.5 cm indeterminate central hypoechoic hepatic lesion. See above comment.   02/22/2021 Imaging   CLINICAL DATA:  Right-sided abdominal pain.   EXAM: ABDOMEN ULTRASOUND COMPLETE  IMPRESSION: 1. Cirrhosis  and small ascites. 2. Gallbladder sludge. No sonographic evidence of acute cholecystitis.   02/22/2021 Imaging   CLINICAL DATA:  Abdominal pain.  Concern for diverticulitis.   EXAM: CT  ABDOMEN AND PELVIS WITH CONTRAST  IMPRESSION: 1. Acute pancreatitis. No abscess or pseudocyst. 2. Cirrhosis with small ascites. 3. Colonic diverticulosis. No bowel obstruction. Normal appendix. 4. Aortic Atherosclerosis (ICD10-I70.0).   03/02/2021 Tumor Marker   CA 19-9 = 45 (^) AFP = 11 (^)   03/03/2021 Imaging   CLINICAL DATA:  Pancreatitis, worsening abdominal pain.   EXAM: CT ABDOMEN AND PELVIS WITH CONTRAST  IMPRESSION: 1. Multiple suspicious lesions in the right hepatic lobe. Patient has underlying cirrhosis and findings are concerning for multifocal hepatocellular carcinoma. Metastatic disease is also in the differential diagnosis. Recommend MRI of the abdomen with and without contrast. If the patient cannot tolerate a MRI, consider a multiphase liver CT. 2. Increased inflammatory changes in the upper abdomen compatible with pancreatitis and a large developing pseudocyst. This is developing pseudocyst measures up to 14 cm in size. 3. Cirrhosis with evidence of portal hypertension demonstrated by esophageal varices and varices draining into the IVC. 4. Indeterminate 3 mm pulmonary nodule in the lingula. Management of this pulmonary nodule depends on the etiology of the liver lesions.   03/04/2021 Imaging   EXAM: MRI ABDOMEN WITHOUT AND WITH CONTRAST (INCLUDING MRCP)  IMPRESSION: 1. Multiple enhancing lesions are noted throughout the right lobe of the liver, as detailed above. These have indeterminate imaging characteristics. Given the background of hepatic cirrhosis, the possibility of multifocal infiltrative hepatocellular carcinoma should be considered, although none of these lesions meet definite imaging criteria to establish that diagnosis. Correlation with AFP level is recommended. Other differential considerations include malignant and benign etiologies such as fibrolamellar hepatocellular carcinoma and multifocal focal nodular hyperplasia (FNH). Consideration for  repeat abdominal MRI with and without IV gadolinium (Eovist) is also suggested, although biopsy may ultimately be required to establish a tissue diagnosis. 2. 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 3. Trace volume of perihepatic ascites.   09/22/2021 Tumor Marker   CA 19-9 = 190 (^) AFP = 124 (^)   01/19/2022 Tumor Marker   CA 19-9 = 816 (^) AFP = 307 (^)   02/06/2022 Imaging   EXAM: MRI ABDOMEN WITHOUT AND WITH CONTRAST (INCLUDING MRCP)  IMPRESSION: 1. Morphologic features of the liver compatible with cirrhosis. 2. Extensive enhancing liver lesions are identified throughout both lobes of liver which demonstrate marked progression compared with the previous exam. Enhancing lesions demonstrate washout on the delayed images. In the setting of cirrhosis with progressively elevated alpha fetoprotein levels imaging findings are highly worrisome for multifocal hepatocellular carcinoma. 3. Enlarged upper abdominal lymph nodes identified. Cannot exclude underlying nodal metastasis. 4. There are several lesions within the central mesentery in the distribution of previous large pseudo cysts. The largest measures 5.0 x 3.1 cm. This is favored to represent sequelae of prior hemorrhagic pancreatitis with pseudocyst formation.   02/23/2022 Initial Diagnosis   Hepatocellular carcinoma (HCC)   03/20/2022 Cancer Staging   Staging form: Liver, AJCC 8th Edition - Clinical stage from 03/20/2022: Stage IVA (cT3, cN1, cM0) - Signed by Malachy Mood, MD on 03/20/2022   03/21/2022 - 04/20/2022 Chemotherapy   Patient is on Treatment Plan : LUNG NSCLC Durvalumab (1500) q28d     04/04/2022 Pathology Results   A. LIVER, RIGHT LOBE, BIOPSY: Well to moderately differentiated CK7 positive adenocarcinoma (see comment)  COMMENT:  Given the cytohistomorphology and this immunohistochemical pattern of cytokeratin 7 positivity, the differential  diagnosis would include a pancreaticobiliary or upper gastrointestinal intestinal tract primary or possibly a lung primary (not favored).    05/11/2022 - 05/11/2022 Chemotherapy   Patient is on Treatment Plan : BLADDER Durvalumab (10) q14d     Cholangiocarcinoma of liver (HCC)  04/23/2022 Initial Diagnosis   Cholangiocarcinoma of liver (HCC)   05/11/2022 - 05/11/2022 Chemotherapy   Patient is on Treatment Plan : BILIARY TRACT Cisplatin + Gemcitabine D1,8 q21d     05/11/2022 - 07/27/2022 Chemotherapy   Patient is on Treatment Plan : PANCREAS GemOx q14d     08/31/2022 -  Chemotherapy   Patient is on Treatment Plan : BILIARY TRACT Cisplatin + Gemcitabine D1,8 + Durvalumab (1500) D1 q21d / Durvalumab (1500) q28d        INTERVAL HISTORY:  Connie [name] is here for a follow up of liver cancer. She was last seen by NP Lacie on 08/30/2022. She presents to the clinic accompanied by her husband.  She tolerated first cycle chemotherapy well last week, except significant leg edema, which started during her chemo.  Will give her IV Lasix during the infusion, and she took oral Lasix daily for 5 days after treatment.  Her leg edema finally improved yesterday, and she was able to put her shoes on today.  She otherwise tolerated well, no nausea, vomiting, or other side effects.  She has been eating well.   All other systems were reviewed with the patient and are negative.  MEDICAL HISTORY:  Past Medical History:  Diagnosis Date   Alcohol abuse    Alcoholic cirrhosis of liver (HCC)    Atrial fibrillation with RVR (HCC) 08/02/2019   Cataract    Chronic kidney disease    Helicobacter pylori gastritis 08/07/2019   Hepatitis C antibody positive in blood - negative RNA    Hypertension    liver ca 01/2022   bile duct ca 05/2022   Pancreatic pseudocyst 03/04/2021    SURGICAL HISTORY: Past Surgical History:  Procedure Laterality Date   BIOPSY  08/03/2019   Procedure: BIOPSY;  Surgeon: Iva Boop, MD;   Location: [facility] ENDOSCOPY;  Service: Endoscopy;;   ESOPHAGOGASTRODUODENOSCOPY (EGD) WITH PROPOFOL N/A 08/03/2019   Procedure: ESOPHAGOGASTRODUODENOSCOPY (EGD) WITH PROPOFOL;  Surgeon: Iva Boop, MD;  Location: [facility] ENDOSCOPY;  Service: Endoscopy;  Laterality: N/A;   IR IMAGING GUIDED PORT INSERTION  05/29/2022   IR RADIOLOGIST EVAL & MGMT  02/14/2022   IR RADIOLOGIST EVAL & MGMT  03/14/2022    I have reviewed the social history and family history with the patient and they are unchanged from previous note.  ALLERGIES:  has No Known Allergies.  MEDICATIONS:  Current Outpatient Medications  Medication Sig Dispense Refill   acetaminophen (TYLENOL) 500 MG tablet Take 1,000 mg by mouth every 6 (six) hours as needed for mild pain.     amLODipine (NORVASC) 5 MG tablet Take 1 tablet by mouth daily.     diclofenac Sodium (VOLTAREN) 1 % GEL Apply 2 g topically 2 (two) times daily as needed (pain).     furosemide (LASIX) 20 MG tablet Take 1 tablet (20 mg total) by mouth daily as needed as directed for leg swelling for no more than 5 days. 20 tablet 0   gabapentin (NEURONTIN) 100 MG capsule Take 1 capsule (100 mg total) by mouth 3 (three) times daily. 90 capsule 2   latanoprost (XALATAN) 0.005 % ophthalmic solution Place 1  drop into both eyes at bedtime.     lidocaine-prilocaine (EMLA) cream Apply 1 Application topically as needed. Apply 5ml to Port-a-Cath site 45 mins to 1 hr before port-a-cath access. 30 g 0   lipase/protease/amylase (CREON) 12000-38000 units CPEP capsule Take 1 capsule (12,000 Units total) by mouth 3 (three) times daily with meals. 270 capsule 2   ondansetron (ZOFRAN) 8 MG tablet Take 1 tablet (8 mg total) by mouth every 8 (eight) hours as needed for nausea or vomiting. 30 tablet 2   pantoprazole (PROTONIX) 40 MG tablet Take 1 tablet (40 mg total) by mouth daily. 90 tablet 1   polyethylene glycol (MIRALAX / GLYCOLAX) 17 g packet Take 17 g by mouth 2 (two) times daily. 14 each 0    potassium chloride SA (KLOR-CON M) 20 MEQ tablet Take 1 tablet (20 mEq total) by mouth 2 (two) times daily.  Take only when taking furosemide (lasix) as directed. 20 tablet 0   prochlorperazine (COMPAZINE) 10 MG tablet Take 1 tablet (10 mg total) by mouth every 6 (six) hours as needed for nausea or vomiting. 30 tablet 2   No current facility-administered medications for this visit.    PHYSICAL EXAMINATION: ECOG PERFORMANCE STATUS: 1 - Symptomatic but completely ambulatory  There were no vitals filed for this visit. Wt Readings from Last 3 Encounters:  08/31/22 230 lb 8 oz (104.6 kg)  08/30/22 229 lb 4.8 oz (104 kg)  08/16/22 234 lb (106.1 kg)     GENERAL:alert, no distress and comfortable SKIN: skin color, texture, turgor are normal, no rashes or significant lesions EYES: normal, Conjunctiva are pink and non-injected, sclera clear NECK: supple, thyroid normal size, non-tender, without nodularity LYMPH:  no palpable lymphadenopathy in the cervical, axillary  LUNGS: clear to auscultation and percussion with normal breathing effort HEART: regular rate & rhythm and no murmurs and (+) pitting edema on bilateral lower extremity edema ABDOMEN:abdomen soft, non-tender and normal bowel sounds Musculoskeletal:no cyanosis of digits and no clubbing  NEURO: alert & oriented x 3 with fluent speech, no focal motor/sensory deficits  LABORATORY DATA:  I have reviewed the data as listed    Latest Ref Rng & Units 08/30/2022    7:58 AM 08/16/2022    7:38 AM 07/27/2022    8:45 AM  CBC  WBC 4.0 - 10.5 K/uL 7.4  6.1  6.5   Hemoglobin 12.0 - 15.0 g/dL 16.1  09.6  04.5   Hematocrit 36.0 - 46.0 % 33.6  30.9  32.3   Platelets 150 - 400 K/uL 107  141  92         Latest Ref Rng & Units 08/30/2022    7:58 AM 08/16/2022    7:38 AM 07/27/2022    8:45 AM  CMP  Glucose 70 - 99 mg/dL 92  93  86   BUN 8 - 23 mg/dL 11  8  7    Creatinine 0.44 - 1.00 mg/dL 4.09  8.11  9.14   Sodium 135 - 145 mmol/L 139  140  138    Potassium 3.5 - 5.1 mmol/L 3.8  3.8  4.1   Chloride 98 - 111 mmol/L 107  110  108   CO2 22 - 32 mmol/L 29  26  26    Calcium 8.9 - 10.3 mg/dL 78.2  9.9  95.6   Total Protein 6.5 - 8.1 g/dL 6.7  6.7  7.6   Total Bilirubin 0.3 - 1.2 mg/dL 0.9  0.8  0.7   Alkaline Phos  38 - 126 U/L 115  110  104   AST 15 - 41 U/L 57  63  60   ALT 0 - 44 U/L 20  22  23        RADIOGRAPHIC STUDIES: I have personally reviewed the radiological images as listed and agreed with the findings in the report. No results found.    No orders of the defined types were placed in this encounter.  All questions were answered. The patient knows to call the clinic with any problems, questions or concerns. No barriers to learning was detected. The total time spent in the appointment was 30 minutes.     Malachy Mood, MD 09/06/2022

## 2022-09-06 NOTE — Assessment & Plan Note (Signed)
-  will monitor renal function closely during chemo    -improved lately  

## 2022-09-07 ENCOUNTER — Inpatient Hospital Stay (HOSPITAL_BASED_OUTPATIENT_CLINIC_OR_DEPARTMENT_OTHER): Payer: 59 | Admitting: Hematology

## 2022-09-07 ENCOUNTER — Ambulatory Visit: Payer: 59

## 2022-09-07 ENCOUNTER — Other Ambulatory Visit: Payer: Self-pay

## 2022-09-07 ENCOUNTER — Other Ambulatory Visit: Payer: 59

## 2022-09-07 ENCOUNTER — Encounter: Payer: Self-pay | Admitting: Hematology

## 2022-09-07 ENCOUNTER — Inpatient Hospital Stay: Payer: 59

## 2022-09-07 ENCOUNTER — Ambulatory Visit: Payer: 59 | Admitting: Hematology

## 2022-09-07 VITALS — BP 137/69 | HR 82 | Resp 18

## 2022-09-07 VITALS — BP 104/57 | HR 58 | Temp 97.7°F | Resp 18 | Ht 65.0 in | Wt 229.7 lb

## 2022-09-07 DIAGNOSIS — N1831 Chronic kidney disease, stage 3a: Secondary | ICD-10-CM | POA: Diagnosis not present

## 2022-09-07 DIAGNOSIS — Z95828 Presence of other vascular implants and grafts: Secondary | ICD-10-CM

## 2022-09-07 DIAGNOSIS — C221 Intrahepatic bile duct carcinoma: Secondary | ICD-10-CM

## 2022-09-07 DIAGNOSIS — C220 Liver cell carcinoma: Secondary | ICD-10-CM

## 2022-09-07 DIAGNOSIS — K7031 Alcoholic cirrhosis of liver with ascites: Secondary | ICD-10-CM

## 2022-09-07 LAB — CBC WITH DIFFERENTIAL (CANCER CENTER ONLY)
Abs Immature Granulocytes: 0.01 10*3/uL (ref 0.00–0.07)
Basophils Absolute: 0 10*3/uL (ref 0.0–0.1)
Basophils Relative: 0 %
Eosinophils Absolute: 0.1 10*3/uL (ref 0.0–0.5)
Eosinophils Relative: 3 %
HCT: 30.9 % — ABNORMAL LOW (ref 36.0–46.0)
Hemoglobin: 10.5 g/dL — ABNORMAL LOW (ref 12.0–15.0)
Immature Granulocytes: 0 %
Lymphocytes Relative: 61 %
Lymphs Abs: 1.9 10*3/uL (ref 0.7–4.0)
MCH: 36.2 pg — ABNORMAL HIGH (ref 26.0–34.0)
MCHC: 34 g/dL (ref 30.0–36.0)
MCV: 106.6 fL — ABNORMAL HIGH (ref 80.0–100.0)
Monocytes Absolute: 0.2 10*3/uL (ref 0.1–1.0)
Monocytes Relative: 5 %
Neutro Abs: 1 10*3/uL — ABNORMAL LOW (ref 1.7–7.7)
Neutrophils Relative %: 31 %
Platelet Count: 72 10*3/uL — ABNORMAL LOW (ref 150–400)
RBC: 2.9 MIL/uL — ABNORMAL LOW (ref 3.87–5.11)
RDW: 17.2 % — ABNORMAL HIGH (ref 11.5–15.5)
Smear Review: NORMAL
WBC Count: 3.1 10*3/uL — ABNORMAL LOW (ref 4.0–10.5)
nRBC: 0 % (ref 0.0–0.2)

## 2022-09-07 LAB — MAGNESIUM: Magnesium: 1.7 mg/dL (ref 1.7–2.4)

## 2022-09-07 LAB — CMP (CANCER CENTER ONLY)
ALT: 29 U/L (ref 0–44)
AST: 62 U/L — ABNORMAL HIGH (ref 15–41)
Albumin: 2.5 g/dL — ABNORMAL LOW (ref 3.5–5.0)
Alkaline Phosphatase: 118 U/L (ref 38–126)
Anion gap: 5 (ref 5–15)
BUN: 8 mg/dL (ref 8–23)
CO2: 26 mmol/L (ref 22–32)
Calcium: 9.7 mg/dL (ref 8.9–10.3)
Chloride: 107 mmol/L (ref 98–111)
Creatinine: 1 mg/dL (ref 0.44–1.00)
GFR, Estimated: >60 mL/min (ref >60)
Glucose, Bld: 101 mg/dL — ABNORMAL HIGH (ref 70–99)
Potassium: 4 mmol/L (ref 3.5–5.1)
Sodium: 138 mmol/L (ref 135–145)
Total Bilirubin: 0.9 mg/dL (ref 0.3–1.2)
Total Protein: 6.7 g/dL (ref 6.5–8.1)

## 2022-09-07 MED ORDER — SODIUM CHLORIDE 0.9% FLUSH
10.0000 mL | INTRAVENOUS | Status: DC | PRN
  Administered 2022-09-07: 10 mL

## 2022-09-07 MED ORDER — SODIUM CHLORIDE 0.9 % IV SOLN
16.0000 mg | Freq: Once | INTRAVENOUS | Status: AC
  Administered 2022-09-07: 16 mg via INTRAVENOUS
  Filled 2022-09-07: qty 8

## 2022-09-07 MED ORDER — SODIUM CHLORIDE 0.9% FLUSH
10.0000 mL | Freq: Once | INTRAVENOUS | Status: AC
  Administered 2022-09-07: 10 mL

## 2022-09-07 MED ORDER — POTASSIUM CHLORIDE IN NACL 20-0.9 MEQ/L-% IV SOLN
Freq: Once | INTRAVENOUS | Status: AC
  Filled 2022-09-07: qty 1000

## 2022-09-07 MED ORDER — SODIUM CHLORIDE 0.9 % IV SOLN
150.0000 mg | Freq: Once | INTRAVENOUS | Status: AC
  Administered 2022-09-07: 150 mg via INTRAVENOUS
  Filled 2022-09-07: qty 150

## 2022-09-07 MED ORDER — ONDANSETRON HCL 8 MG PO TABS: 8.0000 mg | ORAL_TABLET | Freq: Three times a day (TID) | ORAL | 1 refills | Status: DC | PRN

## 2022-09-07 MED ORDER — SODIUM CHLORIDE 0.9 % IV SOLN: Freq: Once | INTRAVENOUS | Status: AC

## 2022-09-07 MED ORDER — FUROSEMIDE 10 MG/ML IJ SOLN
20.0000 mg | Freq: Once | INTRAMUSCULAR | Status: AC
  Administered 2022-09-07: 20 mg via INTRAVENOUS
  Filled 2022-09-07: qty 2

## 2022-09-07 MED ORDER — MAGNESIUM SULFATE 2 GM/50ML IV SOLN
2.0000 g | Freq: Once | INTRAVENOUS | Status: AC
  Administered 2022-09-07: 2 g via INTRAVENOUS
  Filled 2022-09-07: qty 50

## 2022-09-07 MED ORDER — SODIUM CHLORIDE 0.9 % IV SOLN
800.0000 mg/m2 | Freq: Once | INTRAVENOUS | Status: AC
  Administered 2022-09-07: 1786 mg via INTRAVENOUS
  Filled 2022-09-07: qty 46.97

## 2022-09-07 MED ORDER — HEPARIN SOD (PORK) LOCK FLUSH 100 UNIT/ML IV SOLN
500.0000 [IU] | Freq: Once | INTRAVENOUS | Status: AC | PRN
  Administered 2022-09-07: 500 [IU]

## 2022-09-07 MED ORDER — SODIUM CHLORIDE 0.9 % IV SOLN
25.0000 mg/m2 | Freq: Once | INTRAVENOUS | Status: AC
  Administered 2022-09-07: 55 mg via INTRAVENOUS
  Filled 2022-09-07: qty 55

## 2022-09-07 MED ORDER — SODIUM CHLORIDE 0.9 % IV SOLN: Freq: Once | INTRAVENOUS | Status: DC

## 2022-09-07 NOTE — Progress Notes (Signed)
Per Dr. Latanya Maudlin note- ok to proceed with treatment with "slight pancytopenia" today.

## 2022-09-07 NOTE — Patient Instructions (Signed)
[facility]  Discharge Instructions: Thank you for choosing [facility] to provide your oncology and hematology care.   If you have a lab appointment with the Cancer Center, please go directly to the Cancer Center and check in at the registration area.   Wear comfortable clothing and clothing appropriate for easy access to any Portacath or PICC line.   We strive to give you quality time with your provider. You may need to reschedule your appointment if you arrive late (15 or more minutes).  Arriving late affects you and other patients whose appointments are after yours.  Also, if you miss three or more appointments without notifying the office, you may be dismissed from the clinic at the provider's discretion.      For prescription refill requests, have your pharmacy contact our office and allow 72 hours for refills to be completed.    Today you received the following chemotherapy and/or immunotherapy agents: Gemzar/Oxaliplatin   To help prevent nausea and vomiting after your treatment, we encourage you to take your nausea medication as directed.  BELOW ARE SYMPTOMS THAT SHOULD BE REPORTED IMMEDIATELY: *FEVER GREATER THAN 100.4 F (38 C) OR HIGHER *CHILLS OR SWEATING *NAUSEA AND VOMITING THAT IS NOT CONTROLLED WITH YOUR NAUSEA MEDICATION *UNUSUAL SHORTNESS OF BREATH *UNUSUAL BRUISING OR BLEEDING *URINARY PROBLEMS (pain or burning when urinating, or frequent urination) *BOWEL PROBLEMS (unusual diarrhea, constipation, pain near the anus) TENDERNESS IN MOUTH AND THROAT WITH OR WITHOUT PRESENCE OF ULCERS (sore throat, sores in mouth, or a toothache) UNUSUAL RASH, SWELLING OR PAIN  UNUSUAL VAGINAL DISCHARGE OR ITCHING   Items with * indicate a potential emergency and should be followed up as soon as possible or go to the Emergency Department if any problems should occur.  Please show the CHEMOTHERAPY ALERT CARD or IMMUNOTHERAPY ALERT CARD at  check-in to the Emergency Department and triage nurse.  Should you have questions after your visit or need to cancel or reschedule your appointment, please contact [facility]  Dept: [phone]  and follow the prompts.  Office hours are 8:00 a.m. to 4:30 p.m. Monday - Friday. Please note that voicemails left after 4:00 p.m. may not be returned until the following business day.  We are closed weekends and major holidays. You have access to a nurse at all times for urgent questions. Please call the main number to the clinic Dept: [phone] and follow the prompts.   For any non-urgent questions, you may also contact your provider using MyChart. We now offer e-Visits for anyone 18 and older to request care online for non-urgent symptoms. For details visit [email or web address].   Also download the MyChart app! Go to the app store, search "MyChart", open the app, select [facility], and log in with your MyChart username and password. 

## 2022-09-08 ENCOUNTER — Other Ambulatory Visit: Payer: Self-pay

## 2022-09-13 ENCOUNTER — Ambulatory Visit: Payer: 59

## 2022-09-13 ENCOUNTER — Other Ambulatory Visit: Payer: 59

## 2022-09-13 ENCOUNTER — Ambulatory Visit: Payer: 59 | Admitting: Hematology

## 2022-09-14 ENCOUNTER — Inpatient Hospital Stay: Payer: 59

## 2022-09-16 ENCOUNTER — Other Ambulatory Visit: Payer: Self-pay

## 2022-09-20 ENCOUNTER — Ambulatory Visit: Payer: 59 | Admitting: Hematology

## 2022-09-20 ENCOUNTER — Ambulatory Visit: Payer: 59

## 2022-09-20 ENCOUNTER — Other Ambulatory Visit: Payer: 59

## 2022-09-20 MED FILL — Fosaprepitant Dimeglumine For IV Infusion 150 MG (Base Eq): INTRAVENOUS | Qty: 5 | Status: AC

## 2022-09-21 ENCOUNTER — Inpatient Hospital Stay: Payer: 59

## 2022-09-21 ENCOUNTER — Other Ambulatory Visit: Payer: 59

## 2022-09-21 ENCOUNTER — Inpatient Hospital Stay: Payer: 59 | Admitting: Hematology

## 2022-09-21 ENCOUNTER — Ambulatory Visit: Payer: 59 | Admitting: Hematology

## 2022-09-21 ENCOUNTER — Ambulatory Visit: Payer: 59

## 2022-09-21 ENCOUNTER — Telehealth: Payer: Self-pay | Admitting: Hematology

## 2022-09-21 NOTE — Assessment & Plan Note (Deleted)
-  will monitor renal function closely during chemo    -improved lately  -Will monitor closely, especially when she is on cisplatin and furosemide.

## 2022-09-21 NOTE — Progress Notes (Deleted)
   Established Patient Office Visit  Subjective   Patient ID: Connie [name], female    DOB: [birth year]  Age: 67 y.o. MRN: [id number]  No chief complaint on file.   HPI  {History (Optional):23778}  ROS    Objective:     There were no vitals taken for this visit. {Vitals History (Optional):23777}  Physical Exam   No results found for any visits on 09/21/22.  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    Malachy Mood, MD

## 2022-09-21 NOTE — Assessment & Plan Note (Deleted)
Stage IVA (cT3, cN1, cM0)  with diffuse liver mets, unresectable  -Liver cirrhosis and a 2.5cm mass was found in 07/2019 but she did not follow up -due to rising AFP, she had MRI on 02/06/2022 which showed extensive liver lesions (largest 13cm) throughout both lobes with marked progression from prior MRI; enlarged upper abdominal lymph nodes, no distant mets -she started Durvaluamb every 4 weeks, and Tremelimumab 300mg  once on day 1 of cycle 1, on 03/21/2022.  -Although she had typical image findings of HCC, she underwent liver biopsy since CA 19-9 was also elevated  -liver biopsy showed well to moderately differentiated CK7 positive adenocarcinoma and her EGD was negative for upper GI tract malignancy. Tumor cells were also positive for Glycipan 3, supporting mixed HCC and cholagiocarcinoma. -she started GEMOX (cisplatin is contraindicated due to her chronic kidney disease) and continued durvalumab on 05/10/22, she tolerated treatment well overall  -restaging MRI from 08/01/22 showed disease progression in liver.  -her tumor marker AFP has significantly increased lately, but her CA 19.9 is trending down.  -I have changed her treatment to cisplatin, gemcitabine, atezolizumab and bevacizumab, started on 08/30/2022.  -She tolerated first cycle chemotherapy well, except significant leg edema.  We gave her IV Lasix during the infusion, and she took oral Lasix for 5 days after treatment.  Her leg edema has improved

## 2022-09-21 NOTE — Telephone Encounter (Signed)
Contacted patient to scheduled appointments. Patient is aware of appointments that are scheduled.   

## 2022-09-21 NOTE — Assessment & Plan Note (Deleted)
-  f/u with GI, will watch her LFTs closely on chemo     

## 2022-09-21 NOTE — Progress Notes (Deleted)
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 female with   No problem-specific Assessment & Plan notes found for this encounter.  ***   PLAN: {Everything Dr. Mosetta Putt talks to pt about, including reviewing scans and labs. 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  Underlying hepatic cirrhosis suspected.   2.5 cm indeterminate central hypoechoic hepatic lesion. See above comment.   02/22/2021 Imaging   CLINICAL DATA:  Right-sided abdominal pain.   EXAM: ABDOMEN ULTRASOUND COMPLETE  IMPRESSION: 1. Cirrhosis and small ascites. 2. Gallbladder sludge. No sonographic evidence of acute cholecystitis.   02/22/2021 Imaging   CLINICAL DATA:  Abdominal pain.  Concern for diverticulitis.    EXAM: CT ABDOMEN AND PELVIS WITH CONTRAST  IMPRESSION: 1. Acute pancreatitis. No abscess or pseudocyst. 2. Cirrhosis with small ascites. 3. Colonic diverticulosis. No bowel obstruction. Normal appendix. 4. Aortic Atherosclerosis (ICD10-I70.0).   03/02/2021 Tumor Marker   CA 19-9 = 45 (^) AFP = 11 (^)   03/03/2021 Imaging   CLINICAL DATA:  Pancreatitis, worsening abdominal pain.   EXAM: CT ABDOMEN AND PELVIS WITH CONTRAST  IMPRESSION: 1. Multiple suspicious lesions in the right hepatic lobe. Patient has underlying cirrhosis and findings are concerning for multifocal hepatocellular carcinoma. Metastatic disease is also in the differential diagnosis. Recommend MRI of the abdomen with and without contrast. If the patient cannot tolerate a MRI, consider a multiphase liver CT. 2. Increased inflammatory changes in the upper abdomen compatible with pancreatitis and a large developing pseudocyst. This is developing pseudocyst measures up to 14 cm in size. 3. Cirrhosis with evidence of portal hypertension demonstrated by esophageal varices and varices draining into the IVC. 4. Indeterminate 3 mm pulmonary nodule in the lingula. Management of this pulmonary nodule depends on the etiology of the liver lesions.   03/04/2021 Imaging   EXAM: MRI ABDOMEN WITHOUT AND WITH CONTRAST (INCLUDING MRCP)  IMPRESSION: 1. Multiple enhancing lesions are noted throughout the right lobe of the liver, as detailed above. These have indeterminate imaging characteristics. Given the background of hepatic cirrhosis, the possibility of multifocal infiltrative hepatocellular carcinoma should be considered, although none of these lesions meet definite imaging criteria to establish that diagnosis. Correlation with AFP level is recommended. Other differential considerations include malignant and benign etiologies such as fibrolamellar hepatocellular carcinoma and multifocal focal nodular hyperplasia (FNH).  Consideration for repeat abdominal MRI with  and without IV gadolinium (Eovist) is also suggested, although biopsy may ultimately be required to establish a tissue diagnosis. 2. 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 3. Trace volume of perihepatic ascites.   09/22/2021 Tumor Marker   CA 19-9 = 190 (^) AFP = 124 (^)   01/19/2022 Tumor Marker   CA 19-9 = 816 (^) AFP = 307 (^)   02/06/2022 Imaging   EXAM: MRI ABDOMEN WITHOUT AND WITH CONTRAST (INCLUDING MRCP)  IMPRESSION: 1. Morphologic features of the liver compatible with cirrhosis. 2. Extensive enhancing liver lesions are identified throughout both lobes of liver which demonstrate marked progression compared with the previous exam. Enhancing lesions demonstrate washout on the delayed images. In the setting of cirrhosis with progressively elevated alpha fetoprotein levels imaging findings are highly worrisome for multifocal hepatocellular carcinoma. 3. Enlarged upper abdominal lymph nodes identified. Cannot exclude underlying nodal metastasis. 4. There are several lesions within the central mesentery in the distribution of previous large pseudo cysts. The largest measures 5.0 x 3.1 cm. This is favored to represent sequelae of prior hemorrhagic pancreatitis with pseudocyst formation.   02/23/2022 Initial Diagnosis   Hepatocellular carcinoma (HCC)   03/20/2022 Cancer Staging   Staging form: Liver, AJCC 8th Edition - Clinical stage from 03/20/2022: Stage IVA (cT3, cN1, cM0) - Signed by Malachy Mood, MD on 03/20/2022   03/21/2022 - 04/20/2022 Chemotherapy   Patient is on Treatment Plan : LUNG NSCLC Durvalumab (1500) q28d     04/04/2022 Pathology Results   A. LIVER, RIGHT LOBE, BIOPSY: Well to moderately differentiated CK7 positive adenocarcinoma (see comment)  COMMENT:  Given the cytohistomorphology and this immunohistochemical pattern of cytokeratin 7 positivity, the  differential diagnosis would include a pancreaticobiliary or upper gastrointestinal intestinal tract primary or possibly a lung primary (not favored).    05/11/2022 - 05/11/2022 Chemotherapy   Patient is on Treatment Plan : BLADDER Durvalumab (10) q14d     Cholangiocarcinoma of liver (HCC)  04/23/2022 Initial Diagnosis   Cholangiocarcinoma of liver (HCC)   05/11/2022 - 05/11/2022 Chemotherapy   Patient is on Treatment Plan : BILIARY TRACT Cisplatin + Gemcitabine D1,8 q21d     05/11/2022 - 07/27/2022 Chemotherapy   Patient is on Treatment Plan : PANCREAS GemOx q14d     08/31/2022 -  Chemotherapy   Patient is on Treatment Plan : BILIARY TRACT Cisplatin + Gemcitabine D1,8 + Durvalumab (1500) D1 q21d / Durvalumab (1500) q28d        INTERVAL HISTORY: *** Connie [name] is here for a follow up of mixed cholangiocarcinoma and hepatocellular carcinoma  She was last seen by me on 09/06/2022. She presents to the clinic    All other systems were reviewed with the patient and are negative.  MEDICAL HISTORY:  Past Medical History:  Diagnosis Date   Alcohol abuse    Alcoholic cirrhosis of liver (HCC)    Atrial fibrillation with RVR (HCC) 08/02/2019   Cataract    Chronic kidney disease    Helicobacter pylori gastritis 08/07/2019   Hepatitis C antibody positive in blood - negative RNA    Hypertension    liver ca 01/2022   bile duct ca 05/2022   Pancreatic pseudocyst 03/04/2021    SURGICAL HISTORY: Past Surgical History:  Procedure Laterality Date   BIOPSY  08/03/2019   Procedure: BIOPSY;  Surgeon: Iva Boop, MD;  Location: [facility] ENDOSCOPY;  Service: Endoscopy;;   ESOPHAGOGASTRODUODENOSCOPY (EGD) WITH PROPOFOL N/A 08/03/2019   Procedure: ESOPHAGOGASTRODUODENOSCOPY (EGD)  WITH PROPOFOL;  Surgeon: Iva Boop, MD;  Location: [facility] ENDOSCOPY;  Service: Endoscopy;  Laterality: N/A;   IR IMAGING GUIDED PORT INSERTION  05/29/2022   IR RADIOLOGIST EVAL & MGMT  02/14/2022   IR RADIOLOGIST EVAL & MGMT   03/14/2022    I have reviewed the social history and family history with the patient and they are unchanged from previous note.  ALLERGIES:  has No Known Allergies.  MEDICATIONS:  Current Outpatient Medications  Medication Sig Dispense Refill   acetaminophen (TYLENOL) 500 MG tablet Take 1,000 mg by mouth every 6 (six) hours as needed for mild pain.     amLODipine (NORVASC) 5 MG tablet Take 1 tablet by mouth daily.     diclofenac Sodium (VOLTAREN) 1 % GEL Apply 2 g topically 2 (two) times daily as needed (pain).     furosemide (LASIX) 20 MG tablet Take 1 tablet (20 mg total) by mouth daily as needed as directed for leg swelling for no more than 5 days. 20 tablet 0   gabapentin (NEURONTIN) 100 MG capsule Take 1 capsule (100 mg total) by mouth 3 (three) times daily. 90 capsule 2   latanoprost (XALATAN) 0.005 % ophthalmic solution Place 1 drop into both eyes at bedtime.     lidocaine-prilocaine (EMLA) cream Apply 1 Application topically as needed. Apply 5ml to Port-a-Cath site 45 mins to 1 hr before port-a-cath access. 30 g 0   lipase/protease/amylase (CREON) 12000-38000 units CPEP capsule Take 1 capsule (12,000 Units total) by mouth 3 (three) times daily with meals. 270 capsule 2   ondansetron (ZOFRAN) 8 MG tablet Take 1 tablet (8 mg total) by mouth every 8 (eight) hours as needed for nausea or vomiting. 60 tablet 1   pantoprazole (PROTONIX) 40 MG tablet Take 1 tablet (40 mg total) by mouth daily. 90 tablet 1   polyethylene glycol (MIRALAX / GLYCOLAX) 17 g packet Take 17 g by mouth 2 (two) times daily. 14 each 0   potassium chloride SA (KLOR-CON M) 20 MEQ tablet Take 1 tablet (20 mEq total) by mouth 2 (two) times daily.  Take only when taking furosemide (lasix) as directed. 20 tablet 0   prochlorperazine (COMPAZINE) 10 MG tablet Take 1 tablet (10 mg total) by mouth every 6 (six) hours as needed for nausea or vomiting. 30 tablet 2   No current facility-administered medications for this visit.     PHYSICAL EXAMINATION: ECOG PERFORMANCE STATUS: {CHL ONC ECOG PS:[phone]}  There were no vitals filed for this visit. Wt Readings from Last 3 Encounters:  09/07/22 229 lb 11.2 oz (104.2 kg)  08/31/22 230 lb 8 oz (104.6 kg)  08/30/22 229 lb 4.8 oz (104 kg)    {Only keep what was examined. If exam not performed, can use .CEXAM } GENERAL:alert, no distress and comfortable SKIN: skin color, texture, turgor are normal, no rashes or significant lesions EYES: normal, Conjunctiva are pink and non-injected, sclera clear {OROPHARYNX:no exudate, no erythema and lips, buccal mucosa, and tongue normal}  NECK: supple, thyroid normal size, non-tender, without nodularity LYMPH:  no palpable lymphadenopathy in the cervical, axillary {or inguinal} LUNGS: clear to auscultation and percussion with normal breathing effort HEART: regular rate & rhythm and no murmurs and no lower extremity edema ABDOMEN:abdomen soft, non-tender and normal bowel sounds Musculoskeletal:no cyanosis of digits and no clubbing  NEURO: alert & oriented x 3 with fluent speech, no focal motor/sensory deficits  LABORATORY DATA:  I have reviewed the data as listed    Latest  Ref Rng & Units 09/07/2022    7:37 AM 08/30/2022    7:58 AM 08/16/2022    7:38 AM  CBC  WBC 4.0 - 10.5 K/uL 3.1  7.4  6.1   Hemoglobin 12.0 - 15.0 g/dL 40.9  81.1  91.4   Hematocrit 36.0 - 46.0 % 30.9  33.6  30.9   Platelets 150 - 400 K/uL 72  107  141         Latest Ref Rng & Units 09/07/2022    7:37 AM 08/30/2022    7:58 AM 08/16/2022    7:38 AM  CMP  Glucose 70 - 99 mg/dL 782  92  93   BUN 8 - 23 mg/dL 8  11  8    Creatinine 0.44 - 1.00 mg/dL 9.56  2.13  0.86   Sodium 135 - 145 mmol/L 138  139  140   Potassium 3.5 - 5.1 mmol/L 4.0  3.8  3.8   Chloride 98 - 111 mmol/L 107  107  110   CO2 22 - 32 mmol/L 26  29  26    Calcium 8.9 - 10.3 mg/dL 9.7  57.8  9.9   Total Protein 6.5 - 8.1 g/dL 6.7  6.7  6.7   Total Bilirubin 0.3 - 1.2 mg/dL 0.9  0.9  0.8    Alkaline Phos 38 - 126 U/L 118  115  110   AST 15 - 41 U/L 62  57  63   ALT 0 - 44 U/L 29  20  22        RADIOGRAPHIC STUDIES: I have personally reviewed the radiological images as listed and agreed with the findings in the report. No results found.    No orders of the defined types were placed in this encounter.  All questions were answered. The patient knows to call the clinic with any problems, questions or concerns. No barriers to learning was detected. The total time spent in the appointment was {CHL ONC TIME VISIT - IONGE:[id number]}.     Salome Holmes, CMA 09/21/2022   I, Monica Martinez, CMA, am acting as scribe for Malachy Mood, MD.   {Add scribe attestation statement}

## 2022-09-22 ENCOUNTER — Other Ambulatory Visit: Payer: Self-pay

## 2022-09-22 ENCOUNTER — Ambulatory Visit: Payer: 59

## 2022-09-26 MED FILL — Fosaprepitant Dimeglumine For IV Infusion 150 MG (Base Eq): INTRAVENOUS | Qty: 5 | Status: AC

## 2022-09-26 NOTE — Assessment & Plan Note (Signed)
-  f/u with GI, will watch her LFTs closely on chemo     

## 2022-09-26 NOTE — Assessment & Plan Note (Signed)
Stage IVA (cT3, cN1, cM0)  with diffuse liver mets, unresectable  -Liver cirrhosis and a 2.5cm mass was found in 07/2019 but she did not follow up -due to rising AFP, she had MRI on 02/06/2022 which showed extensive liver lesions (largest 13cm) throughout both lobes with marked progression from prior MRI; enlarged upper abdominal lymph nodes, no distant mets -she started Durvaluamb every 4 weeks, and Tremelimumab 300mg once on day 1 of cycle 1, on 03/21/2022.  -Although she had typical image findings of HCC, she underwent liver biopsy since CA 19-9 was also elevated  -liver biopsy showed well to moderately differentiated CK7 positive adenocarcinoma and her EGD was negative for upper GI tract malignancy. Tumor cells were also positive for Glycipan 3, supporting mixed HCC and cholagiocarcinoma. -she started GEMOX (cisplatin is contraindicated due to her chronic kidney disease) and continued durvalumab on 05/10/22, she tolerated treatment well overall  -restaging MRI from 08/01/22 showed disease progression in liver.  -her tumor marker AFP has significantly increased lately, but her CA 19.9 is trending down.  -I have changed her treatment to cisplatin, gemcitabine, atezolizumab and bevacizumab, started on 08/30/2022.  -She tolerated first cycle chemotherapy well, except significant leg edema.  We gave her IV Lasix during the infusion, and she took oral Lasix for 5 days after treatment.  Her leg edema has improved 

## 2022-09-26 NOTE — Progress Notes (Signed)
[facility]   [phone] [phone]   Clinic Follow up Note   Patient Care Team: Loura Back, NP as PCP - General (Nurse Practitioner) Thurmon Fair, MD as PCP - Cardiology (Cardiology) Annamarie Major, CRNP as Nurse Practitioner (Transplant Hepatology) Simonne Come, MD as Consulting Physician (Interventional Radiology) Malachy Mood, MD as Consulting Physician (Hematology and Oncology)  Date of Service:  09/27/2022  CHIEF COMPLAINT: f/u of  follow-up of mixed cholangiocarcinoma and hepatocellular carcinoma   CURRENT THERAPY:  Cisplatin, gemcitabine on day 1 and 8, Atezo and bevacizumab on day 1, every 21 days   ASSESSMENT:  Connie [name] is a 67 y.o. 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  -Although she had typical image findings of HCC, she underwent liver biopsy since CA 19-9 was also elevated  -liver biopsy showed well to moderately differentiated CK7 positive adenocarcinoma and her EGD was negative for upper GI tract malignancy. Tumor cells were also positive for Glycipan 3, supporting mixed HCC and cholagiocarcinoma. -she started GEMOX (cisplatin is contraindicated due to her chronic kidney disease) and continued durvalumab on 05/10/22, she tolerated treatment well overall  -restaging MRI from 08/01/22 showed disease progression in liver.  -her tumor marker AFP has significantly increased lately, but her CA 19.9 is trending down.  -I have changed her treatment to  cisplatin, gemcitabine, atezolizumab and bevacizumab, started on 08/30/2022.  -She tolerated first cycle chemotherapy well, except significant leg edema.  We gave her IV Lasix during the infusion, and she took oral Lasix for 5 days after treatment.  Her leg edema has improved -She complains of low appetite, significant fatigue and drowsiness in the past 4 to 5 days, no fever or signs of infection, I will hold chemo treatment today, and check her ammonia level.  CKD (chronic kidney disease) stage 3, GFR 30-59 ml/min (HCC) -will monitor renal function closely during chemo    -improved lately  -Will monitor closely, especially when she is on furosemide.    Alcoholic cirrhosis of liver with ascites (HCC) -f/u with GI, will watch her LFTs closely on chemo         PLAN: -lab reviewed -cancel treatment due to fatigue and lost appetite.  Will postpone to next week -I recommend Drinking ENSURE -IV hydration today  -I did check her ammonia level, which came back elevated at 56, I will call in lactulose for her. 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  Underlying hepatic cirrhosis suspected.   2.5 cm indeterminate  central hypoechoic hepatic lesion. See above comment.   02/22/2021 Imaging   CLINICAL DATA:  Right-sided abdominal pain.   EXAM: ABDOMEN ULTRASOUND COMPLETE  IMPRESSION: 1. Cirrhosis and small ascites. 2. Gallbladder sludge. No sonographic  evidence of acute cholecystitis.   02/22/2021 Imaging   CLINICAL DATA:  Abdominal pain.  Concern for diverticulitis.   EXAM: CT ABDOMEN AND PELVIS WITH CONTRAST  IMPRESSION: 1. Acute pancreatitis. No abscess or pseudocyst. 2. Cirrhosis with small ascites. 3. Colonic diverticulosis. No bowel obstruction. Normal appendix. 4. Aortic Atherosclerosis (ICD10-I70.0).   03/02/2021 Tumor Marker   CA 19-9 = 45 (^) AFP = 11 (^)   03/03/2021 Imaging   CLINICAL DATA:  Pancreatitis, worsening abdominal pain.   EXAM: CT ABDOMEN AND PELVIS WITH CONTRAST  IMPRESSION: 1. Multiple suspicious lesions in the right hepatic lobe. Patient has underlying cirrhosis and findings are concerning for multifocal hepatocellular carcinoma. Metastatic disease is also in the differential diagnosis. Recommend MRI of the abdomen with and without contrast. If the patient cannot tolerate a MRI, consider a multiphase liver CT. 2. Increased inflammatory changes in the upper abdomen compatible with pancreatitis and a large developing pseudocyst. This is developing pseudocyst measures up to 14 cm in size. 3. Cirrhosis with evidence of portal hypertension demonstrated by esophageal varices and varices draining into the IVC. 4. Indeterminate 3 mm pulmonary nodule in the lingula. Management of this pulmonary nodule depends on the etiology of the liver lesions.   03/04/2021 Imaging   EXAM: MRI ABDOMEN WITHOUT AND WITH CONTRAST (INCLUDING MRCP)  IMPRESSION: 1. Multiple enhancing lesions are noted throughout the right lobe of the liver, as detailed above. These have indeterminate imaging characteristics. Given the background of hepatic cirrhosis, the possibility of multifocal infiltrative hepatocellular carcinoma should be considered, although none of these lesions meet definite imaging criteria to establish that diagnosis. Correlation with AFP level is recommended. Other differential considerations include malignant  and benign etiologies such as fibrolamellar hepatocellular carcinoma and multifocal focal nodular hyperplasia (FNH). Consideration for repeat abdominal MRI with and without IV gadolinium (Eovist) is also suggested, although biopsy may ultimately be required to establish a tissue diagnosis. 2. 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 3. Trace volume of perihepatic ascites.   09/22/2021 Tumor Marker   CA 19-9 = 190 (^) AFP = 124 (^)   01/19/2022 Tumor Marker   CA 19-9 = 816 (^) AFP = 307 (^)   02/06/2022 Imaging   EXAM: MRI ABDOMEN WITHOUT AND WITH CONTRAST (INCLUDING MRCP)  IMPRESSION: 1. Morphologic features of the liver compatible with cirrhosis. 2. Extensive enhancing liver lesions are identified throughout both lobes of liver which demonstrate marked progression compared with the previous exam. Enhancing lesions demonstrate washout on the delayed images. In the setting of cirrhosis with progressively elevated alpha fetoprotein levels imaging findings are highly worrisome for multifocal hepatocellular carcinoma. 3. Enlarged upper abdominal lymph nodes identified. Cannot exclude underlying nodal metastasis. 4. There are several lesions within the central mesentery in the distribution of previous large pseudo cysts. The largest measures 5.0 x 3.1 cm. This is favored to represent sequelae of prior hemorrhagic pancreatitis with pseudocyst formation.   02/23/2022 Initial Diagnosis   Hepatocellular carcinoma (HCC)   03/20/2022 Cancer Staging   Staging form: Liver, AJCC 8th Edition - Clinical stage from 03/20/2022: Stage IVA (cT3, cN1, cM0) - Signed by Malachy Mood, MD on 03/20/2022   03/21/2022 - 04/20/2022 Chemotherapy   Patient is on Treatment Plan : LUNG NSCLC  Durvalumab (1500) q28d     04/04/2022 Pathology Results   A. LIVER, RIGHT LOBE, BIOPSY: Well to moderately differentiated CK7 positive adenocarcinoma (see  comment)  COMMENT:  Given the cytohistomorphology and this immunohistochemical pattern of cytokeratin 7 positivity, the differential diagnosis would include a pancreaticobiliary or upper gastrointestinal intestinal tract primary or possibly a lung primary (not favored).    05/11/2022 - 05/11/2022 Chemotherapy   Patient is on Treatment Plan : BLADDER Durvalumab (10) q14d     Cholangiocarcinoma of liver (HCC)  04/23/2022 Initial Diagnosis   Cholangiocarcinoma of liver (HCC)   05/11/2022 - 05/11/2022 Chemotherapy   Patient is on Treatment Plan : BILIARY TRACT Cisplatin + Gemcitabine D1,8 q21d     05/11/2022 - 07/27/2022 Chemotherapy   Patient is on Treatment Plan : PANCREAS GemOx q14d     08/31/2022 -  Chemotherapy   Patient is on Treatment Plan : BILIARY TRACT Cisplatin + Gemcitabine D1,8 + Durvalumab (1500) D1 q21d / Durvalumab (1500) q28d        INTERVAL HISTORY:  Connie [name] is here for a follow up of  follow-up of mixed cholangiocarcinoma and hepatocellular carcinoma . She was last seen by me on 09/07/2022. She presents to the clinic alone. Pt reports that she does not feeling well and that she has a lot of fatigue. Pt report of not having a good appetite. Pt state that she has been feeling like this for 2-3 days. Pt also reports of being constipation and was able to take miralax for relief. Pt also has lower extremity edema in both legs. Pt is able to work in her yard but not here lately.  they're feeling, complaints and concerns, etc}   All other systems were reviewed with the patient and are negative.  MEDICAL HISTORY:  Past Medical History:  Diagnosis Date   Alcohol abuse    Alcoholic cirrhosis of liver (HCC)    Atrial fibrillation with RVR (HCC) 08/02/2019   Cataract    Chronic kidney disease    Helicobacter pylori gastritis 08/07/2019   Hepatitis C antibody positive in blood - negative RNA    Hypertension    liver ca 01/2022   bile duct ca 05/2022   Pancreatic pseudocyst  03/04/2021    SURGICAL HISTORY: Past Surgical History:  Procedure Laterality Date   BIOPSY  08/03/2019   Procedure: BIOPSY;  Surgeon: Iva Boop, MD;  Location: [facility] ENDOSCOPY;  Service: Endoscopy;;   ESOPHAGOGASTRODUODENOSCOPY (EGD) WITH PROPOFOL N/A 08/03/2019   Procedure: ESOPHAGOGASTRODUODENOSCOPY (EGD) WITH PROPOFOL;  Surgeon: Iva Boop, MD;  Location: [facility] ENDOSCOPY;  Service: Endoscopy;  Laterality: N/A;   IR IMAGING GUIDED PORT INSERTION  05/29/2022   IR RADIOLOGIST EVAL & MGMT  02/14/2022   IR RADIOLOGIST EVAL & MGMT  03/14/2022    I have reviewed the social history and family history with the patient and they are unchanged from previous note.  ALLERGIES:  has No Known Allergies.  MEDICATIONS:  Current Outpatient Medications  Medication Sig Dispense Refill   lactulose (CHRONULAC) 10 GM/15ML solution Take 30 mLs (20 g total) by mouth 2 (two) times daily as needed for mild constipation (titrate dose to have bowel movement 2-3 times a day). 236 mL 0   acetaminophen (TYLENOL) 500 MG tablet Take 1,000 mg by mouth every 6 (six) hours as needed for mild pain.     amLODipine (NORVASC) 5 MG tablet Take 1 tablet by mouth daily.     diclofenac Sodium (VOLTAREN) 1 % GEL Apply 2 g  topically 2 (two) times daily as needed (pain).     furosemide (LASIX) 20 MG tablet Take 1 tablet (20 mg total) by mouth daily as needed as directed for leg swelling for no more than 5 days. 20 tablet 0   gabapentin (NEURONTIN) 100 MG capsule Take 1 capsule (100 mg total) by mouth 3 (three) times daily. 90 capsule 2   latanoprost (XALATAN) 0.005 % ophthalmic solution Place 1 drop into both eyes at bedtime.     lidocaine-prilocaine (EMLA) cream Apply 1 Application topically as needed. Apply 5ml to Port-a-Cath site 45 mins to 1 hr before port-a-cath access. 30 g 0   lipase/protease/amylase (CREON) 12000-38000 units CPEP capsule Take 1 capsule (12,000 Units total) by mouth 3 (three) times daily with meals. 270  capsule 2   ondansetron (ZOFRAN) 8 MG tablet Take 1 tablet (8 mg total) by mouth every 8 (eight) hours as needed for nausea or vomiting. 60 tablet 1   pantoprazole (PROTONIX) 40 MG tablet Take 1 tablet (40 mg total) by mouth daily. 90 tablet 1   polyethylene glycol (MIRALAX / GLYCOLAX) 17 g packet Take 17 g by mouth 2 (two) times daily. 14 each 0   potassium chloride SA (KLOR-CON M) 20 MEQ tablet Take 1 tablet (20 mEq total) by mouth 2 (two) times daily.  Take only when taking furosemide (lasix) as directed. 20 tablet 0   prochlorperazine (COMPAZINE) 10 MG tablet Take 1 tablet (10 mg total) by mouth every 6 (six) hours as needed for nausea or vomiting. 30 tablet 2   No current facility-administered medications for this visit.    PHYSICAL EXAMINATION: ECOG PERFORMANCE STATUS: 2 - Symptomatic, <50% confined to bed  There were no vitals filed for this visit. Wt Readings from Last 3 Encounters:  09/27/22 225 lb (102.1 kg)  09/07/22 229 lb 11.2 oz (104.2 kg)  08/31/22 230 lb 8 oz (104.6 kg)     GENERAL:alert, no distress and comfortable SKIN: skin color, texture, turgor are normal, no rashes or significant lesions EYES: normal, Conjunctiva are pink and non-injected, sclera clear NECK:(-)  supple, thyroid normal size, non-tender, without nodularity LYMPH:  no palpable lymphadenopathy in the cervical, axillary  LUNGS: (-)clear to auscultation and percussion with normal breathing effort HEART: (-) regular rate & rhythm and no murmurs and no lower extremity edema ABDOMEN:(-)abdomen soft, (-)non-tender and(-) normal bowel sounds Musculoskeletal:no cyanosis of digits and no clubbing  NEURO: alert & oriented x 3 with fluent speech, no focal motor/sensory deficits  LABORATORY DATA:  I have reviewed the data as listed    Latest Ref Rng & Units 09/27/2022    8:39 AM 09/07/2022    7:37 AM 08/30/2022    7:58 AM  CBC  WBC 4.0 - 10.5 K/uL 5.2  3.1  7.4   Hemoglobin 12.0 - 15.0 g/dL 9.9  40.9  81.1    Hematocrit 36.0 - 46.0 % 29.4  30.9  33.6   Platelets 150 - 400 K/uL 281  72  107         Latest Ref Rng & Units 09/27/2022    8:39 AM 09/07/2022    7:37 AM 08/30/2022    7:58 AM  CMP  Glucose 70 - 99 mg/dL 914  782  92   BUN 8 - 23 mg/dL 5  8  11    Creatinine 0.44 - 1.00 mg/dL 9.56  2.13  0.86   Sodium 135 - 145 mmol/L 139  138  139   Potassium 3.5 -  5.1 mmol/L 3.9  4.0  3.8   Chloride 98 - 111 mmol/L 109  107  107   CO2 22 - 32 mmol/L 28  26  29    Calcium 8.9 - 10.3 mg/dL 9.6  9.7  16.1   Total Protein 6.5 - 8.1 g/dL 6.6  6.7  6.7   Total Bilirubin 0.3 - 1.2 mg/dL 0.8  0.9  0.9   Alkaline Phos 38 - 126 U/L 143  118  115   AST 15 - 41 U/L 80  62  57   ALT 0 - 44 U/L 32  29  20       RADIOGRAPHIC STUDIES: I have personally reviewed the radiological images as listed and agreed with the findings in the report. No results found.    No orders of the defined types were placed in this encounter.  All questions were answered. The patient knows to call the clinic with any problems, questions or concerns. No barriers to learning was detected. The total time spent in the appointment was 30 minutes.     Malachy Mood, MD 09/27/2022   Carolin Coy, CMA, am acting as scribe for Malachy Mood, MD.   I have reviewed the above documentation for accuracy and completeness, and I agree with the above.

## 2022-09-26 NOTE — Assessment & Plan Note (Signed)
-  will monitor renal function closely during chemo    -improved lately  -Will monitor closely, especially when she is on furosemide.

## 2022-09-27 ENCOUNTER — Other Ambulatory Visit: Payer: 59

## 2022-09-27 ENCOUNTER — Inpatient Hospital Stay: Payer: 59

## 2022-09-27 ENCOUNTER — Ambulatory Visit: Payer: 59

## 2022-09-27 ENCOUNTER — Other Ambulatory Visit: Payer: Self-pay

## 2022-09-27 ENCOUNTER — Inpatient Hospital Stay (HOSPITAL_BASED_OUTPATIENT_CLINIC_OR_DEPARTMENT_OTHER): Payer: 59 | Admitting: Hematology

## 2022-09-27 ENCOUNTER — Ambulatory Visit: Payer: 59 | Admitting: Hematology

## 2022-09-27 VITALS — Ht 65.0 in | Wt 225.0 lb

## 2022-09-27 DIAGNOSIS — C220 Liver cell carcinoma: Secondary | ICD-10-CM

## 2022-09-27 DIAGNOSIS — K7031 Alcoholic cirrhosis of liver with ascites: Secondary | ICD-10-CM | POA: Diagnosis not present

## 2022-09-27 DIAGNOSIS — C221 Intrahepatic bile duct carcinoma: Secondary | ICD-10-CM

## 2022-09-27 DIAGNOSIS — Z95828 Presence of other vascular implants and grafts: Secondary | ICD-10-CM

## 2022-09-27 DIAGNOSIS — N1831 Chronic kidney disease, stage 3a: Secondary | ICD-10-CM

## 2022-09-27 LAB — CMP (CANCER CENTER ONLY)
ALT: 32 U/L (ref 0–44)
AST: 80 U/L — ABNORMAL HIGH (ref 15–41)
Albumin: 2.4 g/dL — ABNORMAL LOW (ref 3.5–5.0)
Alkaline Phosphatase: 143 U/L — ABNORMAL HIGH (ref 38–126)
Anion gap: 2 — ABNORMAL LOW (ref 5–15)
BUN: 5 mg/dL — ABNORMAL LOW (ref 8–23)
CO2: 28 mmol/L (ref 22–32)
Calcium: 9.6 mg/dL (ref 8.9–10.3)
Chloride: 109 mmol/L (ref 98–111)
Creatinine: 0.93 mg/dL (ref 0.44–1.00)
GFR, Estimated: >60 mL/min (ref >60)
Glucose, Bld: 103 mg/dL — ABNORMAL HIGH (ref 70–99)
Potassium: 3.9 mmol/L (ref 3.5–5.1)
Sodium: 139 mmol/L (ref 135–145)
Total Bilirubin: 0.8 mg/dL (ref 0.3–1.2)
Total Protein: 6.6 g/dL (ref 6.5–8.1)

## 2022-09-27 LAB — CBC WITH DIFFERENTIAL (CANCER CENTER ONLY)
Abs Immature Granulocytes: 0.03 10*3/uL (ref 0.00–0.07)
Basophils Absolute: 0 10*3/uL (ref 0.0–0.1)
Basophils Relative: 0 %
Eosinophils Absolute: 0.1 10*3/uL (ref 0.0–0.5)
Eosinophils Relative: 1 %
HCT: 29.4 % — ABNORMAL LOW (ref 36.0–46.0)
Hemoglobin: 9.9 g/dL — ABNORMAL LOW (ref 12.0–15.0)
Immature Granulocytes: 1 %
Lymphocytes Relative: 47 %
Lymphs Abs: 2.4 10*3/uL (ref 0.7–4.0)
MCH: 35.6 pg — ABNORMAL HIGH (ref 26.0–34.0)
MCHC: 33.7 g/dL (ref 30.0–36.0)
MCV: 105.8 fL — ABNORMAL HIGH (ref 80.0–100.0)
Monocytes Absolute: 1.6 10*3/uL — ABNORMAL HIGH (ref 0.1–1.0)
Monocytes Relative: 30 %
Neutro Abs: 1.1 10*3/uL — ABNORMAL LOW (ref 1.7–7.7)
Neutrophils Relative %: 21 %
Platelet Count: 281 10*3/uL (ref 150–400)
RBC: 2.78 MIL/uL — ABNORMAL LOW (ref 3.87–5.11)
RDW: 20 % — ABNORMAL HIGH (ref 11.5–15.5)
Smear Review: NORMAL
WBC Count: 5.2 10*3/uL (ref 4.0–10.5)
nRBC: 0.8 % — ABNORMAL HIGH (ref 0.0–0.2)

## 2022-09-27 LAB — MAGNESIUM: Magnesium: 1.7 mg/dL (ref 1.7–2.4)

## 2022-09-27 LAB — AMMONIA: Ammonia: 56 umol/L — ABNORMAL HIGH (ref 9–35)

## 2022-09-27 MED ORDER — SODIUM CHLORIDE 0.9% FLUSH
10.0000 mL | Freq: Once | INTRAVENOUS | Status: AC
  Administered 2022-09-27: 10 mL via INTRAVENOUS

## 2022-09-27 MED ORDER — LACTULOSE 10 GM/15ML PO SOLN: 20.0000 g | Freq: Two times a day (BID) | ORAL | 0 refills | Status: DC | PRN

## 2022-09-27 MED ORDER — SODIUM CHLORIDE 0.9 % IV SOLN: Freq: Once | INTRAVENOUS | Status: AC

## 2022-09-27 MED ORDER — HEPARIN SOD (PORK) LOCK FLUSH 100 UNIT/ML IV SOLN
500.0000 [IU] | Freq: Once | INTRAVENOUS | Status: AC
  Administered 2022-09-27: 500 [IU] via INTRAVENOUS

## 2022-09-27 MED ORDER — LIDOCAINE-PRILOCAINE 2.5-2.5 % EX CREA: 1.0000 | TOPICAL_CREAM | CUTANEOUS | 0 refills | Status: DC | PRN

## 2022-09-27 MED ORDER — SODIUM CHLORIDE 0.9% FLUSH
10.0000 mL | Freq: Once | INTRAVENOUS | Status: AC | PRN
  Administered 2022-09-27: 10 mL

## 2022-09-27 NOTE — Patient Instructions (Signed)

## 2022-09-27 NOTE — Patient Instructions (Addendum)

## 2022-09-27 NOTE — Progress Notes (Signed)
Pt presented to infusion today for D1C2 tx. Pt had new c/o weakness and fatigue. Pt stated "I do not feel like moving or doing anything". Pt appeared to be have a flat affect, unlike her baseline personality. This RN made Dr. Mosetta Putt aware. Dr. Mosetta Putt assessed Pt chairside in infusion. Dr. Mosetta Putt recommended tx be HELD today for Pt to recover. Pt was agreeable to hold tx and verbalized understanding. This RN received V/O from Dr. Mosetta Putt for 500 cc of NS to be administered over 45 minutes prior to discharge today. Per Dr. Mosetta Putt Pt to be rescheduled for tx next week.

## 2022-09-28 ENCOUNTER — Inpatient Hospital Stay: Payer: 59

## 2022-09-28 ENCOUNTER — Inpatient Hospital Stay: Payer: 59 | Admitting: Hematology

## 2022-09-28 ENCOUNTER — Other Ambulatory Visit: Payer: Self-pay

## 2022-09-28 ENCOUNTER — Telehealth: Payer: Self-pay

## 2022-09-28 MED ORDER — POTASSIUM CHLORIDE CRYS ER 20 MEQ PO TBCR: 20.0000 meq | EXTENDED_RELEASE_TABLET | Freq: Two times a day (BID) | ORAL | 0 refills | Status: DC

## 2022-09-28 NOTE — Telephone Encounter (Signed)
Spoke with pt and spouse regarding Ammonia lab results.  Informed them that pt's ammonia level was elevated and Dr. Mosetta Putt feel this is why the pt is feeling so fatigue and confused.  Stated that Dr. Mosetta Putt has prescribed Lactulose for the pt to take up to 4 times a day starting today until she has a bowel movement.  Continue to take PRN to maintain 2 to 3 BM's daily for 5 days or until mental confusion resolve.  Instructed pt to take PO K+ while having loose/watery stools.  Pt requested refill on PO K+.  Refill prescription sent to [facility] as requested by pt.  Stated that Dr. Mosetta Putt will redraw Ammonia when pt returns to clinic on 10/04/2022.  Pt and spouse verbalized understanding and stated they will pickup the lactulose & PO K+ today from [facility].

## 2022-10-03 MED FILL — Fosaprepitant Dimeglumine For IV Infusion 150 MG (Base Eq): INTRAVENOUS | Qty: 5 | Status: AC

## 2022-10-03 NOTE — Assessment & Plan Note (Signed)
-  will monitor renal function closely during chemo    -improved lately  -Will monitor closely, especially when she is on furosemide.   

## 2022-10-03 NOTE — Assessment & Plan Note (Signed)
-  resolved since she started chemo

## 2022-10-03 NOTE — Assessment & Plan Note (Signed)
Stage IVA (cT3, cN1, cM0)  with diffuse liver mets, unresectable  -Liver cirrhosis and a 2.5cm mass was found in 07/2019 but she did not follow up -due to rising AFP, she had MRI on 02/06/2022 which showed extensive liver lesions (largest 13cm) throughout both lobes with marked progression from prior MRI; enlarged upper abdominal lymph nodes, no distant mets -she started Durvaluamb every 4 weeks, and Tremelimumab 300mg  once on day 1 of cycle 1, on 03/21/2022.  -Although she had typical image findings of HCC, she underwent liver biopsy since CA 19-9 was also elevated  -liver biopsy showed well to moderately differentiated CK7 positive adenocarcinoma and her EGD was negative for upper GI tract malignancy. Tumor cells were also positive for Glycipan 3, supporting mixed HCC and cholagiocarcinoma. -she started GEMOX (cisplatin is contraindicated due to her chronic kidney disease) and continued durvalumab on 05/10/22, she tolerated treatment well overall  -restaging MRI from 08/01/22 showed disease progression in liver.  -her tumor marker AFP has significantly increased lately, but her CA 19.9 is trending down.  -I have changed her treatment to cisplatin, gemcitabine, atezolizumab and bevacizumab, started on 08/30/2022.  -She tolerated first cycle chemotherapy well, except significant leg edema.  We gave her IV Lasix during the infusion, and she took oral Lasix for 5 days after treatment.  Her leg edema has improved -due to severe fatigue and drowsiness, chemo was held last week

## 2022-10-03 NOTE — Assessment & Plan Note (Signed)
-  f/u with GI, will watch her LFTs closely on chemo     

## 2022-10-04 ENCOUNTER — Encounter: Payer: Self-pay | Admitting: Hematology

## 2022-10-04 ENCOUNTER — Inpatient Hospital Stay: Payer: 59 | Attending: Hematology

## 2022-10-04 ENCOUNTER — Inpatient Hospital Stay (HOSPITAL_BASED_OUTPATIENT_CLINIC_OR_DEPARTMENT_OTHER): Payer: 59 | Admitting: Hematology

## 2022-10-04 ENCOUNTER — Inpatient Hospital Stay: Payer: 59

## 2022-10-04 DIAGNOSIS — E860 Dehydration: Secondary | ICD-10-CM | POA: Diagnosis not present

## 2022-10-04 DIAGNOSIS — I4891 Unspecified atrial fibrillation: Secondary | ICD-10-CM | POA: Diagnosis not present

## 2022-10-04 DIAGNOSIS — K863 Pseudocyst of pancreas: Secondary | ICD-10-CM | POA: Diagnosis not present

## 2022-10-04 DIAGNOSIS — I700 Atherosclerosis of aorta: Secondary | ICD-10-CM | POA: Diagnosis not present

## 2022-10-04 DIAGNOSIS — Z5189 Encounter for other specified aftercare: Secondary | ICD-10-CM | POA: Diagnosis not present

## 2022-10-04 DIAGNOSIS — K5730 Diverticulosis of large intestine without perforation or abscess without bleeding: Secondary | ICD-10-CM | POA: Diagnosis not present

## 2022-10-04 DIAGNOSIS — I129 Hypertensive chronic kidney disease with stage 1 through stage 4 chronic kidney disease, or unspecified chronic kidney disease: Secondary | ICD-10-CM | POA: Diagnosis not present

## 2022-10-04 DIAGNOSIS — I8510 Secondary esophageal varices without bleeding: Secondary | ICD-10-CM | POA: Diagnosis not present

## 2022-10-04 DIAGNOSIS — N1831 Chronic kidney disease, stage 3a: Secondary | ICD-10-CM | POA: Diagnosis not present

## 2022-10-04 DIAGNOSIS — Z79899 Other long term (current) drug therapy: Secondary | ICD-10-CM | POA: Diagnosis not present

## 2022-10-04 DIAGNOSIS — Z5111 Encounter for antineoplastic chemotherapy: Secondary | ICD-10-CM | POA: Insufficient documentation

## 2022-10-04 DIAGNOSIS — I952 Hypotension due to drugs: Secondary | ICD-10-CM | POA: Diagnosis not present

## 2022-10-04 DIAGNOSIS — F17210 Nicotine dependence, cigarettes, uncomplicated: Secondary | ICD-10-CM | POA: Diagnosis not present

## 2022-10-04 DIAGNOSIS — C220 Liver cell carcinoma: Secondary | ICD-10-CM

## 2022-10-04 DIAGNOSIS — D61818 Other pancytopenia: Secondary | ICD-10-CM | POA: Insufficient documentation

## 2022-10-04 DIAGNOSIS — N183 Chronic kidney disease, stage 3 unspecified: Secondary | ICD-10-CM | POA: Insufficient documentation

## 2022-10-04 DIAGNOSIS — C221 Intrahepatic bile duct carcinoma: Secondary | ICD-10-CM

## 2022-10-04 DIAGNOSIS — K859 Acute pancreatitis without necrosis or infection, unspecified: Secondary | ICD-10-CM | POA: Diagnosis not present

## 2022-10-04 DIAGNOSIS — R7989 Other specified abnormal findings of blood chemistry: Secondary | ICD-10-CM | POA: Insufficient documentation

## 2022-10-04 DIAGNOSIS — G893 Neoplasm related pain (acute) (chronic): Secondary | ICD-10-CM | POA: Insufficient documentation

## 2022-10-04 DIAGNOSIS — K7031 Alcoholic cirrhosis of liver with ascites: Secondary | ICD-10-CM | POA: Insufficient documentation

## 2022-10-04 DIAGNOSIS — Z801 Family history of malignant neoplasm of trachea, bronchus and lung: Secondary | ICD-10-CM | POA: Diagnosis not present

## 2022-10-04 DIAGNOSIS — K5909 Other constipation: Secondary | ICD-10-CM | POA: Diagnosis not present

## 2022-10-04 LAB — CBC WITH DIFFERENTIAL (CANCER CENTER ONLY)
Abs Immature Granulocytes: 0.05 10*3/uL (ref 0.00–0.07)
Basophils Absolute: 0.1 10*3/uL (ref 0.0–0.1)
Basophils Relative: 1 %
Eosinophils Absolute: 0 10*3/uL (ref 0.0–0.5)
Eosinophils Relative: 1 %
HCT: 30.9 % — ABNORMAL LOW (ref 36.0–46.0)
Hemoglobin: 10.6 g/dL — ABNORMAL LOW (ref 12.0–15.0)
Immature Granulocytes: 1 %
Lymphocytes Relative: 39 %
Lymphs Abs: 3 10*3/uL (ref 0.7–4.0)
MCH: 35.9 pg — ABNORMAL HIGH (ref 26.0–34.0)
MCHC: 34.3 g/dL (ref 30.0–36.0)
MCV: 104.7 fL — ABNORMAL HIGH (ref 80.0–100.0)
Monocytes Absolute: 1.6 10*3/uL — ABNORMAL HIGH (ref 0.1–1.0)
Monocytes Relative: 21 %
Neutro Abs: 2.8 10*3/uL (ref 1.7–7.7)
Neutrophils Relative %: 37 %
Platelet Count: 211 10*3/uL (ref 150–400)
RBC: 2.95 MIL/uL — ABNORMAL LOW (ref 3.87–5.11)
RDW: 19.8 % — ABNORMAL HIGH (ref 11.5–15.5)
WBC Count: 7.6 10*3/uL (ref 4.0–10.5)
nRBC: 0 % (ref 0.0–0.2)

## 2022-10-04 LAB — CMP (CANCER CENTER ONLY)
ALT: 31 U/L (ref 0–44)
AST: 80 U/L — ABNORMAL HIGH (ref 15–41)
Albumin: 2.4 g/dL — ABNORMAL LOW (ref 3.5–5.0)
Alkaline Phosphatase: 137 U/L — ABNORMAL HIGH (ref 38–126)
Anion gap: 3 — ABNORMAL LOW (ref 5–15)
BUN: 7 mg/dL — ABNORMAL LOW (ref 8–23)
CO2: 26 mmol/L (ref 22–32)
Calcium: 9.8 mg/dL (ref 8.9–10.3)
Chloride: 109 mmol/L (ref 98–111)
Creatinine: 0.98 mg/dL (ref 0.44–1.00)
GFR, Estimated: >60 mL/min (ref >60)
Glucose, Bld: 107 mg/dL — ABNORMAL HIGH (ref 70–99)
Potassium: 4.1 mmol/L (ref 3.5–5.1)
Sodium: 138 mmol/L (ref 135–145)
Total Bilirubin: 0.8 mg/dL (ref 0.3–1.2)
Total Protein: 6.5 g/dL (ref 6.5–8.1)

## 2022-10-04 LAB — MAGNESIUM: Magnesium: 1.7 mg/dL (ref 1.7–2.4)

## 2022-10-04 LAB — TSH: TSH: 4.429 u[IU]/mL (ref 0.350–4.500)

## 2022-10-04 MED ORDER — HEPARIN SOD (PORK) LOCK FLUSH 100 UNIT/ML IV SOLN
500.0000 [IU] | Freq: Once | INTRAVENOUS | Status: AC | PRN
  Administered 2022-10-04: 500 [IU]

## 2022-10-04 MED ORDER — PROCHLORPERAZINE MALEATE 10 MG PO TABS: 10.0000 mg | ORAL_TABLET | Freq: Four times a day (QID) | ORAL | 2 refills | Status: DC | PRN

## 2022-10-04 MED ORDER — SODIUM CHLORIDE 0.9 % IV SOLN
16.0000 mg | Freq: Once | INTRAVENOUS | Status: AC
  Administered 2022-10-04: 16 mg via INTRAVENOUS
  Filled 2022-10-04: qty 8

## 2022-10-04 MED ORDER — SODIUM CHLORIDE 0.9 % IV SOLN
800.0000 mg/m2 | Freq: Once | INTRAVENOUS | Status: AC
  Administered 2022-10-04: 1786 mg via INTRAVENOUS
  Filled 2022-10-04: qty 46.97

## 2022-10-04 MED ORDER — SODIUM CHLORIDE 0.9 % IV SOLN
150.0000 mg | Freq: Once | INTRAVENOUS | Status: AC
  Administered 2022-10-04: 150 mg via INTRAVENOUS
  Filled 2022-10-04: qty 150

## 2022-10-04 MED ORDER — SODIUM CHLORIDE 0.9% FLUSH
10.0000 mL | INTRAVENOUS | Status: DC | PRN
  Administered 2022-10-04: 10 mL

## 2022-10-04 MED ORDER — MAGNESIUM SULFATE 2 GM/50ML IV SOLN
2.0000 g | Freq: Once | INTRAVENOUS | Status: AC
  Administered 2022-10-04: 2 g via INTRAVENOUS
  Filled 2022-10-04: qty 50

## 2022-10-04 MED ORDER — POTASSIUM CHLORIDE IN NACL 20-0.9 MEQ/L-% IV SOLN
Freq: Once | INTRAVENOUS | Status: AC
  Filled 2022-10-04: qty 1000

## 2022-10-04 MED ORDER — SODIUM CHLORIDE 0.9 % IV SOLN: Freq: Once | INTRAVENOUS | Status: AC

## 2022-10-04 MED ORDER — FUROSEMIDE 10 MG/ML IJ SOLN
20.0000 mg | Freq: Once | INTRAMUSCULAR | Status: AC
  Administered 2022-10-04: 20 mg via INTRAVENOUS
  Filled 2022-10-04: qty 2

## 2022-10-04 MED ORDER — LACTULOSE 10 GM/15ML PO SOLN: 20.0000 g | Freq: Two times a day (BID) | ORAL | 1 refills | Status: DC | PRN

## 2022-10-04 MED ORDER — SODIUM CHLORIDE 0.9 % IV SOLN
25.0000 mg/m2 | Freq: Once | INTRAVENOUS | Status: AC
  Administered 2022-10-04: 55 mg via INTRAVENOUS
  Filled 2022-10-04: qty 55

## 2022-10-04 NOTE — Progress Notes (Signed)
Cisplatin and gemcitabine only today per Dr Mosetta Putt

## 2022-10-04 NOTE — Progress Notes (Signed)
[facility]   Telephone:(336) [phone] Fax:([phone]) [phone]   Clinic Follow up Note   Patient Care Team: Loura Back, NP as PCP - General (Nurse Practitioner) Thurmon Fair, MD as PCP - Cardiology (Cardiology) Annamarie Major, CRNP as Nurse Practitioner (Transplant Hepatology) Simonne Come, MD as Consulting Physician (Interventional Radiology) Malachy Mood, MD as Consulting Physician (Hematology and Oncology)  Date of Service:  10/04/2022  CHIEF COMPLAINT: f/u of mixed cholangiocarcinoma and hepatocellular carcinoma     CURRENT THERAPY:  Cisplatin, gemcitabine on day 1 and 8, Atezo and bevacizumab on day 1, every 21 days   ASSESSMENT:  Connie [name] is a 67 y.o. 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  -Although she had typical image findings of HCC, she underwent liver biopsy since CA 19-9 was also elevated  -liver biopsy showed well to moderately differentiated CK7 positive adenocarcinoma and her EGD was negative for upper GI tract malignancy. Tumor cells were also positive for Glycipan 3, supporting mixed HCC and cholagiocarcinoma. -she started GEMOX (cisplatin is contraindicated due to her chronic kidney disease) and continued durvalumab on 05/10/22, she tolerated treatment well overall  -restaging MRI from 08/01/22 showed disease progression in liver.  -her tumor marker AFP has significantly increased lately, but her CA 19.9 is trending down.  -I have changed her treatment to cisplatin,  gemcitabine, atezolizumab and bevacizumab, started on 08/30/2022.  -She tolerated first cycle chemotherapy well, except significant leg edema.  We gave her IV Lasix during the infusion, and she took oral Lasix for 5 days after treatment.  Her leg edema has improved -due to severe fatigue and drowsiness, chemo was held last week  -She is clinically doing much better, lab reviewed, adequate for treatment, will proceed chemo today. -plan to repeat scan after next cycle chemo  Alcoholic cirrhosis of liver with ascites (HCC) -f/u with GI, will watch her LFTs closely on chemo      CKD (chronic kidney disease) stage 3, GFR 30-59 ml/min (HCC) -will monitor renal function closely during chemo    -improved lately  -Will monitor closely, especially when she is on furosemide.    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  Underlying hepatic cirrhosis suspected.   2.5 cm indeterminate central hypoechoic hepatic lesion. See above comment.   02/22/2021 Imaging   CLINICAL DATA:  Right-sided abdominal pain.   EXAM: ABDOMEN ULTRASOUND COMPLETE  IMPRESSION:  1. Cirrhosis and small ascites. 2. Gallbladder sludge. No sonographic evidence of acute cholecystitis.   02/22/2021 Imaging   CLINICAL DATA:  Abdominal pain.  Concern for diverticulitis.    EXAM: CT ABDOMEN AND PELVIS WITH CONTRAST  IMPRESSION: 1. Acute pancreatitis. No abscess or pseudocyst. 2. Cirrhosis with small ascites. 3. Colonic diverticulosis. No bowel obstruction. Normal appendix. 4. Aortic Atherosclerosis (ICD10-I70.0).   03/02/2021 Tumor Marker   CA 19-9 = 45 (^) AFP = 11 (^)   03/03/2021 Imaging   CLINICAL DATA:  Pancreatitis, worsening abdominal pain.   EXAM: CT ABDOMEN AND PELVIS WITH CONTRAST  IMPRESSION: 1. Multiple suspicious lesions in the right hepatic lobe. Patient has underlying cirrhosis and findings are concerning for multifocal hepatocellular carcinoma. Metastatic disease is also in the differential diagnosis. Recommend MRI of the abdomen with and without contrast. If the patient cannot tolerate a MRI, consider a multiphase liver CT. 2. Increased inflammatory changes in the upper abdomen compatible with pancreatitis and a large developing pseudocyst. This is developing pseudocyst measures up to 14 cm in size. 3. Cirrhosis with evidence of portal hypertension demonstrated by esophageal varices and varices draining into the IVC. 4. Indeterminate 3 mm pulmonary nodule in the lingula. Management of this pulmonary nodule depends on the etiology of the liver lesions.   03/04/2021 Imaging   EXAM: MRI ABDOMEN WITHOUT AND WITH CONTRAST (INCLUDING MRCP)  IMPRESSION: 1. Multiple enhancing lesions are noted throughout the right lobe of the liver, as detailed above. These have indeterminate imaging characteristics. Given the background of hepatic cirrhosis, the possibility of multifocal infiltrative hepatocellular carcinoma should be considered, although none of these lesions meet definite imaging criteria to establish that diagnosis. Correlation with AFP level is recommended. Other differential considerations include malignant and benign etiologies such as fibrolamellar hepatocellular carcinoma and multifocal focal nodular hyperplasia (FNH).  Consideration for repeat abdominal MRI with and without IV gadolinium (Eovist) is also suggested, although biopsy may ultimately be required to establish a tissue diagnosis. 2. 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 3. Trace volume of perihepatic ascites.   09/22/2021 Tumor Marker   CA 19-9 = 190 (^) AFP = 124 (^)   01/19/2022 Tumor Marker   CA 19-9 = 816 (^) AFP = 307 (^)   02/06/2022 Imaging   EXAM: MRI ABDOMEN WITHOUT AND WITH CONTRAST (INCLUDING MRCP)  IMPRESSION: 1. Morphologic features of the liver compatible with cirrhosis. 2. Extensive enhancing liver lesions are identified throughout both lobes of liver which demonstrate marked progression compared with the previous exam. Enhancing lesions demonstrate washout on the delayed images. In the setting of cirrhosis with progressively elevated alpha fetoprotein levels imaging findings are highly worrisome for multifocal hepatocellular carcinoma. 3. Enlarged upper abdominal lymph nodes identified. Cannot exclude underlying nodal metastasis. 4. There are several lesions within the central mesentery in the distribution of previous large pseudo cysts. The largest measures 5.0 x 3.1 cm. This is favored to represent sequelae of prior hemorrhagic pancreatitis with pseudocyst formation.   02/23/2022 Initial Diagnosis   Hepatocellular carcinoma (HCC)   03/20/2022 Cancer Staging   Staging form: Liver, AJCC 8th Edition - Clinical stage from 03/20/2022: Stage IVA (cT3, cN1, cM0) - Signed by Malachy Mood, MD on 03/20/2022   03/21/2022 - 04/20/2022 Chemotherapy   Patient is on Treatment Plan : LUNG NSCLC Durvalumab (1500) q28d     04/04/2022 Pathology Results   A. LIVER, RIGHT LOBE, BIOPSY: Well to moderately differentiated CK7 positive adenocarcinoma (see comment)  COMMENT:  Given the cytohistomorphology and this immunohistochemical pattern of cytokeratin 7 positivity, the  differential diagnosis would include a pancreaticobiliary or upper gastrointestinal intestinal tract primary or possibly a lung primary (not favored).    05/11/2022 - 05/11/2022 Chemotherapy   Patient is on Treatment Plan : BLADDER Durvalumab (10) q14d     Cholangiocarcinoma of liver (HCC)  04/23/2022 Initial Diagnosis   Cholangiocarcinoma of liver (HCC)   05/11/2022 - 05/11/2022 Chemotherapy   Patient is on Treatment Plan : BILIARY TRACT Cisplatin + Gemcitabine D1,8 q21d     05/11/2022 - 07/27/2022 Chemotherapy   Patient is on Treatment Plan : PANCREAS GemOx q14d     08/31/2022 -  Chemotherapy   Patient is on Treatment Plan : BILIARY TRACT Cisplatin + Gemcitabine D1,8 + Durvalumab (1500) D1 q21d / Durvalumab (1500) q28d        INTERVAL HISTORY:  Connie [name] is here for a follow up of mixed cholangiocarcinoma and hepatocellular carcinoma . She was last seen by me on 09/27/2022. She presents to the clinic alone. Pt state that she is doing well after taking the Lactulose  that was prescribe to her.Pt state that her energy level is ok, and that the swelling in her legs has decrease.    All other systems were reviewed with the patient and are negative.  MEDICAL HISTORY:  Past Medical History:  Diagnosis Date   Alcohol abuse    Alcoholic cirrhosis of liver (HCC)    Atrial fibrillation with RVR (HCC) 08/02/2019   Cataract    Chronic kidney disease    Helicobacter pylori gastritis 08/07/2019   Hepatitis C antibody positive in blood - negative RNA    Hypertension    liver ca 01/2022   bile duct ca 05/2022   Pancreatic pseudocyst 03/04/2021    SURGICAL HISTORY: Past Surgical History:  Procedure Laterality Date   BIOPSY  08/03/2019   Procedure: BIOPSY;  Surgeon: Iva Boop, MD;  Location: [facility] ENDOSCOPY;  Service: Endoscopy;;   ESOPHAGOGASTRODUODENOSCOPY (EGD) WITH PROPOFOL N/A 08/03/2019   Procedure: ESOPHAGOGASTRODUODENOSCOPY (EGD) WITH PROPOFOL;  Surgeon: Iva Boop, MD;   Location: [facility] ENDOSCOPY;  Service: Endoscopy;  Laterality: N/A;   IR IMAGING GUIDED PORT INSERTION  05/29/2022   IR RADIOLOGIST EVAL & MGMT  02/14/2022   IR RADIOLOGIST EVAL & MGMT  03/14/2022    I have reviewed the social history and family history with the patient and they are unchanged from previous note.  ALLERGIES:  has No Known Allergies.  MEDICATIONS:  Current Outpatient Medications  Medication Sig Dispense Refill   acetaminophen (TYLENOL) 500 MG tablet Take 1,000 mg by mouth every 6 (six) hours as needed for mild pain.     amLODipine (NORVASC) 5 MG tablet Take 1 tablet by mouth daily.     diclofenac Sodium (VOLTAREN) 1 % GEL Apply 2 g topically 2 (two) times daily as needed (pain).     furosemide (LASIX) 20 MG tablet Take 1 tablet (20 mg total) by mouth daily as needed as directed for leg swelling for no more than 5 days. 20 tablet 0   gabapentin (NEURONTIN) 100 MG capsule Take 1 capsule (100 mg total) by mouth 3 (three) times daily. 90 capsule 2   lactulose (CHRONULAC) 10 GM/15ML solution Take 30 mLs (20 g total) by mouth 2 (two) times daily as needed for mild constipation (titrate dose to have bowel movement 2-3 times a day). 473 mL 1   latanoprost (XALATAN) 0.005 % ophthalmic solution Place 1  drop into both eyes at bedtime.     lidocaine-prilocaine (EMLA) cream Apply 1 Application topically as needed. Apply 5ml to Port-a-Cath site 45 mins to 1 hr before port-a-cath access. 30 g 0   lipase/protease/amylase (CREON) 12000-38000 units CPEP capsule Take 1 capsule (12,000 Units total) by mouth 3 (three) times daily with meals. 270 capsule 2   ondansetron (ZOFRAN) 8 MG tablet Take 1 tablet (8 mg total) by mouth every 8 (eight) hours as needed for nausea or vomiting. 60 tablet 1   pantoprazole (PROTONIX) 40 MG tablet Take 1 tablet (40 mg total) by mouth daily. 90 tablet 1   polyethylene glycol (MIRALAX / GLYCOLAX) 17 g packet Take 17 g by mouth 2 (two) times daily. 14 each 0   potassium  chloride SA (KLOR-CON M) 20 MEQ tablet Take 1 tablet (20 mEq total) by mouth 2 (two) times daily.  Take only when taking furosemide (lasix) as directed. 20 tablet 0   prochlorperazine (COMPAZINE) 10 MG tablet Take 1 tablet (10 mg total) by mouth every 6 (six) hours as needed for nausea or vomiting. 30 tablet 2   No current facility-administered medications for this visit.   Facility-Administered Medications Ordered in Other Visits  Medication Dose Route Frequency Provider Last Rate Last Admin   CISplatin (PLATINOL) 55 mg in sodium chloride 0.9 % 250 mL chemo infusion  25 mg/m2 (Treatment Plan Recorded) Intravenous Once Malachy Mood, MD       furosemide (LASIX) injection 20 mg  20 mg Intravenous Once Malachy Mood, MD       gemcitabine (GEMZAR) 1,786 mg in sodium chloride 0.9 % 250 mL chemo infusion  800 mg/m2 (Treatment Plan Recorded) Intravenous Once Malachy Mood, MD       heparin lock flush 100 unit/mL  500 Units Intracatheter Once PRN Malachy Mood, MD       sodium chloride flush (NS) 0.9 % injection 10 mL  10 mL Intracatheter PRN Malachy Mood, MD        PHYSICAL EXAMINATION: ECOG PERFORMANCE STATUS: 1 - Symptomatic but completely ambulatory  There were no vitals filed for this visit. Wt Readings from Last 3 Encounters:  10/04/22 226 lb 12 oz (102.9 kg)  09/27/22 225 lb (102.1 kg)  09/07/22 229 lb 11.2 oz (104.2 kg)     GENERAL:alert, no distress and comfortable SKIN: skin color normal, no rashes or significant lesions EYES: normal, Conjunctiva are pink and non-injected, sclera clear  NEURO: alert & oriented x 3 with fluent speech  LABORATORY DATA:  I have reviewed the data as listed    Latest Ref Rng & Units 10/04/2022    7:53 AM 09/27/2022    8:39 AM 09/07/2022    7:37 AM  CBC  WBC 4.0 - 10.5 K/uL 7.6  5.2  3.1   Hemoglobin 12.0 - 15.0 g/dL 16.1  9.9  09.6   Hematocrit 36.0 - 46.0 % 30.9  29.4  30.9   Platelets 150 - 400 K/uL 211  281  72         Latest Ref Rng & Units 10/04/2022    7:53  AM 09/27/2022    8:39 AM 09/07/2022    7:37 AM  CMP  Glucose 70 - 99 mg/dL 045  409  811   BUN 8 - 23 mg/dL 7  5  8    Creatinine 0.44 - 1.00 mg/dL 9.14  7.82  9.56   Sodium 135 - 145 mmol/L 138  139  138   Potassium 3.5 -  5.1 mmol/L 4.1  3.9  4.0   Chloride 98 - 111 mmol/L 109  109  107   CO2 22 - 32 mmol/L 26  28  26    Calcium 8.9 - 10.3 mg/dL 9.8  9.6  9.7   Total Protein 6.5 - 8.1 g/dL 6.5  6.6  6.7   Total Bilirubin 0.3 - 1.2 mg/dL 0.8  0.8  0.9   Alkaline Phos 38 - 126 U/L 137  143  118   AST 15 - 41 U/L 80  80  62   ALT 0 - 44 U/L 31  32  29       RADIOGRAPHIC STUDIES: I have personally reviewed the radiological images as listed and agreed with the findings in the report. No results found.    Orders Placed This Encounter  Procedures   CBC with Differential (Cancer Center Only)    Standing Status:   Future    Standing Expiration Date:   11/08/2023   CMP (Cancer Center only)    Standing Status:   Future    Standing Expiration Date:   11/08/2023   Magnesium    Standing Status:   Future    Standing Expiration Date:   11/08/2023   CBC with Differential (Cancer Center Only)    Standing Status:   Future    Standing Expiration Date:   11/15/2023   CMP (Cancer Center only)    Standing Status:   Future    Standing Expiration Date:   11/15/2023   Magnesium    Standing Status:   Future    Standing Expiration Date:   11/15/2023   CBC with Differential (Cancer Center Only)    Standing Status:   Future    Standing Expiration Date:   11/29/2023   CMP (Cancer Center only)    Standing Status:   Future    Standing Expiration Date:   11/29/2023   Magnesium    Standing Status:   Future    Standing Expiration Date:   11/29/2023   CBC with Differential (Cancer Center Only)    Standing Status:   Future    Standing Expiration Date:   12/06/2023   CMP (Cancer Center only)    Standing Status:   Future    Standing Expiration Date:   12/06/2023   Magnesium    Standing Status:   Future     Standing Expiration Date:   12/06/2023   All questions were answered. The patient knows to call the clinic with any problems, questions or concerns. No barriers to learning was detected. The total time spent in the appointment was 25 minutes.     Malachy Mood, MD 10/04/2022   Carolin Coy, CMA, am acting as scribe for Malachy Mood, MD.   I have reviewed the above documentation for accuracy and completeness, and I agree with the above.

## 2022-10-04 NOTE — Progress Notes (Signed)
Ok to run post hydration along with pt cisplatin today per Dr Mosetta Putt

## 2022-10-04 NOTE — Patient Instructions (Signed)
[facility]  Discharge Instructions: Thank you for choosing [facility] to provide your oncology and hematology care.   If you have a lab appointment with the Cancer Center, please go directly to the Cancer Center and check in at the registration area.   Wear comfortable clothing and clothing appropriate for easy access to any Portacath or PICC line.   We strive to give you quality time with your provider. You may need to reschedule your appointment if you arrive late (15 or more minutes).  Arriving late affects you and other patients whose appointments are after yours.  Also, if you miss three or more appointments without notifying the office, you may be dismissed from the clinic at the provider's discretion.      For prescription refill requests, have your pharmacy contact our office and allow 72 hours for refills to be completed.    Today you received the following chemotherapy and/or immunotherapy agents: Gemzar and Cisplatin      To help prevent nausea and vomiting after your treatment, we encourage you to take your nausea medication as directed.  BELOW ARE SYMPTOMS THAT SHOULD BE REPORTED IMMEDIATELY: *FEVER GREATER THAN 100.4 F (38 C) OR HIGHER *CHILLS OR SWEATING *NAUSEA AND VOMITING THAT IS NOT CONTROLLED WITH YOUR NAUSEA MEDICATION *UNUSUAL SHORTNESS OF BREATH *UNUSUAL BRUISING OR BLEEDING *URINARY PROBLEMS (pain or burning when urinating, or frequent urination) *BOWEL PROBLEMS (unusual diarrhea, constipation, pain near the anus) TENDERNESS IN MOUTH AND THROAT WITH OR WITHOUT PRESENCE OF ULCERS (sore throat, sores in mouth, or a toothache) UNUSUAL RASH, SWELLING OR PAIN  UNUSUAL VAGINAL DISCHARGE OR ITCHING   Items with * indicate a potential emergency and should be followed up as soon as possible or go to the Emergency Department if any problems should occur.  Please show the CHEMOTHERAPY ALERT CARD or IMMUNOTHERAPY ALERT  CARD at check-in to the Emergency Department and triage nurse.  Should you have questions after your visit or need to cancel or reschedule your appointment, please contact [facility]  Dept: [phone]  and follow the prompts.  Office hours are 8:00 a.m. to 4:30 p.m. Monday - Friday. Please note that voicemails left after 4:00 p.m. may not be returned until the following business day.  We are closed weekends and major holidays. You have access to a nurse at all times for urgent questions. Please call the main number to the clinic Dept: [phone] and follow the prompts.   For any non-urgent questions, you may also contact your provider using MyChart. We now offer e-Visits for anyone 18 and older to request care online for non-urgent symptoms. For details visit [email or web address].   Also download the MyChart app! Go to the app store, search "MyChart", open the app, select [facility], and log in with your MyChart username and password.   

## 2022-10-06 LAB — CANCER ANTIGEN 19-9: CA 19-9: 349 U/mL — ABNORMAL HIGH (ref 0–35)

## 2022-10-06 LAB — AFP TUMOR MARKER: AFP, Serum, Tumor Marker: 718 ng/mL — ABNORMAL HIGH (ref 0.0–9.2)

## 2022-10-07 ENCOUNTER — Other Ambulatory Visit: Payer: Self-pay

## 2022-10-12 ENCOUNTER — Inpatient Hospital Stay: Payer: 59

## 2022-10-12 ENCOUNTER — Inpatient Hospital Stay: Payer: 59 | Admitting: Hematology

## 2022-10-12 ENCOUNTER — Other Ambulatory Visit: Payer: Self-pay | Admitting: Hematology

## 2022-10-13 ENCOUNTER — Telehealth: Payer: Self-pay

## 2022-10-13 NOTE — Telephone Encounter (Signed)
Called and LVM that pt will be seeing Georga Kaufmann, PA on 10/25/22 instead of Lillard Anes, NP. Reassured Pt that appt times are staying the same, but will be seeing a different provider. Gave call back number with any questions.

## 2022-10-17 MED FILL — Fosaprepitant Dimeglumine For IV Infusion 150 MG (Base Eq): INTRAVENOUS | Qty: 5 | Status: AC

## 2022-10-18 ENCOUNTER — Inpatient Hospital Stay: Payer: 59

## 2022-10-18 ENCOUNTER — Other Ambulatory Visit: Payer: Self-pay

## 2022-10-18 ENCOUNTER — Inpatient Hospital Stay (HOSPITAL_BASED_OUTPATIENT_CLINIC_OR_DEPARTMENT_OTHER): Payer: 59 | Admitting: Nurse Practitioner

## 2022-10-18 ENCOUNTER — Encounter: Payer: Self-pay | Admitting: Nurse Practitioner

## 2022-10-18 VITALS — BP 136/82 | HR 76 | Resp 16

## 2022-10-18 DIAGNOSIS — C221 Intrahepatic bile duct carcinoma: Secondary | ICD-10-CM

## 2022-10-18 DIAGNOSIS — N1831 Chronic kidney disease, stage 3a: Secondary | ICD-10-CM

## 2022-10-18 DIAGNOSIS — C220 Liver cell carcinoma: Secondary | ICD-10-CM | POA: Diagnosis not present

## 2022-10-18 DIAGNOSIS — Z95828 Presence of other vascular implants and grafts: Secondary | ICD-10-CM

## 2022-10-18 LAB — CBC WITH DIFFERENTIAL (CANCER CENTER ONLY)
Abs Immature Granulocytes: 0.02 10*3/uL (ref 0.00–0.07)
Basophils Absolute: 0.1 10*3/uL (ref 0.0–0.1)
Basophils Relative: 1 %
Eosinophils Absolute: 0.6 10*3/uL — ABNORMAL HIGH (ref 0.0–0.5)
Eosinophils Relative: 8 %
HCT: 31 % — ABNORMAL LOW (ref 36.0–46.0)
Hemoglobin: 10.8 g/dL — ABNORMAL LOW (ref 12.0–15.0)
Immature Granulocytes: 0 %
Lymphocytes Relative: 35 %
Lymphs Abs: 2.6 10*3/uL (ref 0.7–4.0)
MCH: 36.5 pg — ABNORMAL HIGH (ref 26.0–34.0)
MCHC: 34.8 g/dL (ref 30.0–36.0)
MCV: 104.7 fL — ABNORMAL HIGH (ref 80.0–100.0)
Monocytes Absolute: 1.2 10*3/uL — ABNORMAL HIGH (ref 0.1–1.0)
Monocytes Relative: 16 %
Neutro Abs: 3 10*3/uL (ref 1.7–7.7)
Neutrophils Relative %: 40 %
Platelet Count: 97 10*3/uL — ABNORMAL LOW (ref 150–400)
RBC: 2.96 MIL/uL — ABNORMAL LOW (ref 3.87–5.11)
RDW: 20.1 % — ABNORMAL HIGH (ref 11.5–15.5)
WBC Count: 7.4 10*3/uL (ref 4.0–10.5)
nRBC: 0 % (ref 0.0–0.2)

## 2022-10-18 LAB — CMP (CANCER CENTER ONLY)
ALT: 36 U/L (ref 0–44)
AST: 85 U/L — ABNORMAL HIGH (ref 15–41)
Albumin: 2.2 g/dL — ABNORMAL LOW (ref 3.5–5.0)
Alkaline Phosphatase: 144 U/L — ABNORMAL HIGH (ref 38–126)
Anion gap: 3 — ABNORMAL LOW (ref 5–15)
BUN: 8 mg/dL (ref 8–23)
CO2: 28 mmol/L (ref 22–32)
Calcium: 9.1 mg/dL (ref 8.9–10.3)
Chloride: 109 mmol/L (ref 98–111)
Creatinine: 1.04 mg/dL — ABNORMAL HIGH (ref 0.44–1.00)
GFR, Estimated: 59 mL/min — ABNORMAL LOW (ref >60)
Glucose, Bld: 102 mg/dL — ABNORMAL HIGH (ref 70–99)
Potassium: 3.7 mmol/L (ref 3.5–5.1)
Sodium: 140 mmol/L (ref 135–145)
Total Bilirubin: 1 mg/dL (ref 0.3–1.2)
Total Protein: 6.2 g/dL — ABNORMAL LOW (ref 6.5–8.1)

## 2022-10-18 LAB — TSH: TSH: 8.323 u[IU]/mL — ABNORMAL HIGH (ref 0.350–4.500)

## 2022-10-18 LAB — TOTAL PROTEIN, URINE DIPSTICK: Protein, ur: NEGATIVE mg/dL

## 2022-10-18 LAB — MAGNESIUM: Magnesium: 1.8 mg/dL (ref 1.7–2.4)

## 2022-10-18 MED ORDER — SODIUM CHLORIDE 0.9 % IV SOLN
16.0000 mg | Freq: Once | INTRAVENOUS | Status: AC
  Administered 2022-10-18: 16 mg via INTRAVENOUS
  Filled 2022-10-18: qty 8

## 2022-10-18 MED ORDER — POTASSIUM CHLORIDE CRYS ER 20 MEQ PO TBCR: 20.0000 meq | EXTENDED_RELEASE_TABLET | Freq: Two times a day (BID) | ORAL | 0 refills | Status: DC

## 2022-10-18 MED ORDER — FUROSEMIDE 20 MG PO TABS: 20.0000 mg | ORAL_TABLET | Freq: Every day | ORAL | 0 refills | Status: DC

## 2022-10-18 MED ORDER — SODIUM CHLORIDE 0.9 % IV SOLN
150.0000 mg | Freq: Once | INTRAVENOUS | Status: AC
  Administered 2022-10-18: 150 mg via INTRAVENOUS
  Filled 2022-10-18: qty 150

## 2022-10-18 MED ORDER — SODIUM CHLORIDE 0.9% FLUSH
10.0000 mL | Freq: Once | INTRAVENOUS | Status: AC | PRN
  Administered 2022-10-18: 10 mL

## 2022-10-18 MED ORDER — SODIUM CHLORIDE 0.9 % IV SOLN
800.0000 mg/m2 | Freq: Once | INTRAVENOUS | Status: AC
  Administered 2022-10-18: 1786 mg via INTRAVENOUS
  Filled 2022-10-18: qty 46.97

## 2022-10-18 MED ORDER — MAGNESIUM SULFATE 2 GM/50ML IV SOLN
2.0000 g | Freq: Once | INTRAVENOUS | Status: AC
  Administered 2022-10-18: 2 g via INTRAVENOUS
  Filled 2022-10-18: qty 50

## 2022-10-18 MED ORDER — HEPARIN SOD (PORK) LOCK FLUSH 100 UNIT/ML IV SOLN
500.0000 [IU] | Freq: Once | INTRAVENOUS | Status: AC | PRN
  Administered 2022-10-18: 500 [IU]

## 2022-10-18 MED ORDER — FUROSEMIDE 10 MG/ML IJ SOLN
20.0000 mg | Freq: Once | INTRAMUSCULAR | Status: AC
  Administered 2022-10-18: 20 mg via INTRAVENOUS
  Filled 2022-10-18: qty 2

## 2022-10-18 MED ORDER — SODIUM CHLORIDE 0.9 % IV SOLN
7.5000 mg/kg | Freq: Once | INTRAVENOUS | Status: AC
  Administered 2022-10-18: 800 mg via INTRAVENOUS
  Filled 2022-10-18: qty 32

## 2022-10-18 MED ORDER — PROCHLORPERAZINE MALEATE 10 MG PO TABS: 10.0000 mg | ORAL_TABLET | Freq: Four times a day (QID) | ORAL | 2 refills | Status: DC | PRN

## 2022-10-18 MED ORDER — SODIUM CHLORIDE 0.9 % IV SOLN: Freq: Once | INTRAVENOUS | Status: AC

## 2022-10-18 MED ORDER — SODIUM CHLORIDE 0.9 % IV SOLN
1200.0000 mg | Freq: Once | INTRAVENOUS | Status: AC
  Administered 2022-10-18: 1200 mg via INTRAVENOUS
  Filled 2022-10-18: qty 20

## 2022-10-18 MED ORDER — LACTULOSE 10 GM/15ML PO SOLN: 20.0000 g | Freq: Two times a day (BID) | ORAL | 1 refills | Status: DC | PRN

## 2022-10-18 MED ORDER — SODIUM CHLORIDE 0.9% FLUSH
10.0000 mL | INTRAVENOUS | Status: DC | PRN
  Administered 2022-10-18: 10 mL

## 2022-10-18 MED ORDER — SODIUM CHLORIDE 0.9 % IV SOLN
25.0000 mg/m2 | Freq: Once | INTRAVENOUS | Status: AC
  Administered 2022-10-18: 55 mg via INTRAVENOUS
  Filled 2022-10-18: qty 55

## 2022-10-18 MED ORDER — POTASSIUM CHLORIDE IN NACL 20-0.9 MEQ/L-% IV SOLN
Freq: Once | INTRAVENOUS | Status: AC
  Filled 2022-10-18: qty 1000

## 2022-10-18 NOTE — Progress Notes (Signed)
Ok to run post hydration with cisplatin today per Santiago Glad, NP

## 2022-10-18 NOTE — Progress Notes (Signed)
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  Underlying hepatic cirrhosis suspected.   2.5 cm indeterminate central hypoechoic hepatic lesion. See above comment.   02/22/2021 Imaging   CLINICAL DATA:  Right-sided abdominal pain.   EXAM: ABDOMEN ULTRASOUND COMPLETE  IMPRESSION: 1. Cirrhosis and small ascites. 2. Gallbladder sludge. No sonographic evidence of acute cholecystitis.   02/22/2021 Imaging   CLINICAL DATA:  Abdominal pain.  Concern for diverticulitis.   EXAM: CT ABDOMEN AND PELVIS WITH CONTRAST  IMPRESSION: 1. Acute pancreatitis. No abscess or pseudocyst. 2. Cirrhosis with small ascites. 3. Colonic diverticulosis. No bowel obstruction. Normal appendix. 4. Aortic Atherosclerosis (ICD10-I70.0).   03/02/2021 Tumor Marker   CA 19-9 = 45 (^) AFP = 11 (^)   03/03/2021 Imaging   CLINICAL DATA:  Pancreatitis, worsening abdominal pain.   EXAM: CT ABDOMEN AND PELVIS WITH CONTRAST  IMPRESSION: 1. Multiple suspicious lesions in the right hepatic lobe. Patient has underlying cirrhosis and findings are concerning for  multifocal hepatocellular carcinoma. Metastatic disease is also in the differential diagnosis. Recommend MRI of the abdomen with and without contrast. If the patient cannot tolerate a MRI, consider a multiphase liver CT. 2. Increased inflammatory changes in the upper abdomen compatible with pancreatitis and a large developing pseudocyst. This is developing pseudocyst measures up to 14 cm in size. 3. Cirrhosis with evidence of portal hypertension demonstrated by esophageal varices and varices draining into the IVC. 4. Indeterminate 3 mm pulmonary nodule in the lingula. Management of this pulmonary nodule depends on the etiology of the liver lesions.   03/04/2021 Imaging   EXAM: MRI ABDOMEN WITHOUT AND WITH CONTRAST (INCLUDING MRCP)  IMPRESSION: 1. Multiple enhancing lesions are noted throughout the right lobe of the liver, as detailed above. These have indeterminate imaging characteristics. Given the background of hepatic cirrhosis, the possibility of multifocal infiltrative hepatocellular carcinoma should be considered, although none of these lesions meet definite imaging criteria to establish that diagnosis. Correlation with AFP level is recommended. Other differential considerations include malignant and benign etiologies such as fibrolamellar hepatocellular carcinoma and multifocal focal nodular hyperplasia (FNH). Consideration for repeat abdominal MRI with and without IV gadolinium (Eovist) is also suggested, although biopsy may ultimately be required to establish a tissue diagnosis. 2. 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 3. Trace volume of perihepatic ascites.   09/22/2021 Tumor Marker   CA 19-9 = 190 (^) AFP = 124 (^)   01/19/2022 Tumor Marker   CA 19-9 = 816 (^) AFP = 307 (^)   02/06/2022 Imaging   EXAM: MRI ABDOMEN WITHOUT AND WITH CONTRAST (INCLUDING MRCP)  IMPRESSION: 1. Morphologic features of the liver  compatible with cirrhosis. 2.  Extensive enhancing liver lesions are identified throughout both lobes of liver which demonstrate marked progression compared with the previous exam. Enhancing lesions demonstrate washout on the delayed images. In the setting of cirrhosis with progressively elevated alpha fetoprotein levels imaging findings are highly worrisome for multifocal hepatocellular carcinoma. 3. Enlarged upper abdominal lymph nodes identified. Cannot exclude underlying nodal metastasis. 4. There are several lesions within the central mesentery in the distribution of previous large pseudo cysts. The largest measures 5.0 x 3.1 cm. This is favored to represent sequelae of prior hemorrhagic pancreatitis with pseudocyst formation.   02/23/2022 Initial Diagnosis   Hepatocellular carcinoma (HCC)   03/20/2022 Cancer Staging   Staging form: Liver, AJCC 8th Edition - Clinical stage from 03/20/2022: Stage IVA (cT3, cN1, cM0) - Signed by Malachy Mood, MD on 03/20/2022   03/21/2022 - 04/20/2022 Chemotherapy   Patient is on Treatment Plan : LUNG NSCLC Durvalumab (1500) q28d     04/04/2022 Pathology Results   A. LIVER, RIGHT LOBE, BIOPSY: Well to moderately differentiated CK7 positive adenocarcinoma (see comment)  COMMENT:  Given the cytohistomorphology and this immunohistochemical pattern of cytokeratin 7 positivity, the differential diagnosis would include a pancreaticobiliary or upper gastrointestinal intestinal tract primary or possibly a lung primary (not favored).    05/11/2022 - 05/11/2022 Chemotherapy   Patient is on Treatment Plan : BLADDER Durvalumab (10) q14d     Cholangiocarcinoma of liver (HCC)  04/23/2022 Initial Diagnosis   Cholangiocarcinoma of liver (HCC)   05/11/2022 - 05/11/2022 Chemotherapy   Patient is on Treatment Plan : BILIARY TRACT Cisplatin + Gemcitabine D1,8 q21d     05/11/2022 - 07/27/2022 Chemotherapy   Patient is on Treatment Plan : PANCREAS GemOx q14d     08/31/2022  -  Chemotherapy   Patient is on Treatment Plan : BILIARY TRACT Cisplatin + Gemcitabine D1,8 + Durvalumab (1500) D1 q21d / Durvalumab (1500) q28d        CURRENT THERAPY: Cisplatin, gemcitabine on day 1 and 8, Atezo and bevacizumab on day 1, every 21 days; started 08/30/22  INTERVAL HISTORY [name] returns for follow up as scheduled. Last seen by Dr. Mosetta Putt 10/04/22 and completed C2D8 cis/gem (Day 1 was omitted on 5/29). She felt OK after last chemo, no significant n/v or other side effects. Bowels move most days. Out of lactulose, lasix, K, and compazine. Legs still swollen but much better than before. Denies pain, fever, chills, cough, chest pain, dyspnea, or other new/specific complaints except she is just tired overall. It was her birthday yesterday, celebrated and went to bed late.   ROS  All other systems reviewed and negative  Past Medical History:  Diagnosis Date   Alcohol abuse    Alcoholic cirrhosis of liver (HCC)    Atrial fibrillation with RVR (HCC) 08/02/2019   Cataract    Chronic kidney disease    Helicobacter pylori gastritis 08/07/2019   Hepatitis C antibody positive in blood - negative RNA    Hypertension    liver ca 01/2022   bile duct ca 05/2022   Pancreatic pseudocyst 03/04/2021     Past Surgical History:  Procedure Laterality Date   BIOPSY  08/03/2019   Procedure: BIOPSY;  Surgeon: Iva Boop, MD;  Location: [facility] ENDOSCOPY;  Service: Endoscopy;;   ESOPHAGOGASTRODUODENOSCOPY (EGD) WITH PROPOFOL N/A 08/03/2019   Procedure: ESOPHAGOGASTRODUODENOSCOPY (EGD) WITH PROPOFOL;  Surgeon: Iva Boop, MD;  Location: [facility] ENDOSCOPY;  Service: Endoscopy;  Laterality: N/A;   IR IMAGING GUIDED PORT INSERTION  05/29/2022   IR  RADIOLOGIST EVAL & MGMT  02/14/2022   IR RADIOLOGIST EVAL & MGMT  03/14/2022     Outpatient Encounter Medications as of 10/18/2022  Medication Sig   acetaminophen (TYLENOL) 500 MG tablet Take 1,000 mg by mouth every 6 (six) hours as needed for mild pain.    amLODipine (NORVASC) 5 MG tablet Take 1 tablet by mouth daily.   diclofenac Sodium (VOLTAREN) 1 % GEL Apply 2 g topically 2 (two) times daily as needed (pain).   gabapentin (NEURONTIN) 100 MG capsule Take 1 capsule (100 mg total) by mouth 3 (three) times daily.   latanoprost (XALATAN) 0.005 % ophthalmic solution Place 1 drop into both eyes at bedtime.   lidocaine-prilocaine (EMLA) cream Apply 1 Application topically as needed. Apply 5ml to Port-a-Cath site 45 mins to 1 hr before port-a-cath access.   lipase/protease/amylase (CREON) 12000-38000 units CPEP capsule Take 1 capsule (12,000 Units total) by mouth 3 (three) times daily with meals.   ondansetron (ZOFRAN) 8 MG tablet Take 1 tablet (8 mg total) by mouth every 8 (eight) hours as needed for nausea or vomiting.   pantoprazole (PROTONIX) 40 MG tablet TAKE 1 TABLET BY MOUTH DAILY   polyethylene glycol (MIRALAX / GLYCOLAX) 17 g packet Take 17 g by mouth 2 (two) times daily.   [DISCONTINUED] lactulose (CHRONULAC) 10 GM/15ML solution Take 30 mLs (20 g total) by mouth 2 (two) times daily as needed for mild constipation (titrate dose to have bowel movement 2-3 times a day).   [DISCONTINUED] potassium chloride SA (KLOR-CON M) 20 MEQ tablet Take 1 tablet (20 mEq total) by mouth 2 (two) times daily.  Take only when taking furosemide (lasix) as directed.   [DISCONTINUED] prochlorperazine (COMPAZINE) 10 MG tablet Take 1 tablet (10 mg total) by mouth every 6 (six) hours as needed for nausea or vomiting.   furosemide (LASIX) 20 MG tablet Take 1 tablet (20 mg total) by mouth daily as needed as directed for leg swelling for no more than 5 days.   lactulose (CHRONULAC) 10 GM/15ML solution Take 30 mLs (20 g total) by mouth 2 (two) times daily as needed for mild constipation (titrate dose to have bowel movement 2-3 times a day).   potassium chloride SA (KLOR-CON M) 20 MEQ tablet Take 1 tablet (20 mEq total) by mouth 2 (two) times daily.  Take only when taking  furosemide (lasix) as directed.   prochlorperazine (COMPAZINE) 10 MG tablet Take 1 tablet (10 mg total) by mouth every 6 (six) hours as needed for nausea or vomiting.   [DISCONTINUED] furosemide (LASIX) 20 MG tablet Take 1 tablet (20 mg total) by mouth daily as needed as directed for leg swelling for no more than 5 days. (Patient not taking: Reported on 10/18/2022)   No facility-administered encounter medications on file as of 10/18/2022.     Today's Vitals   10/18/22 0821 10/18/22 0829  BP: 108/70   Pulse: 63   Resp: 13   Temp: (!) 97.3 F (36.3 C)   TempSrc: Temporal   SpO2: 98%   Weight: 223 lb 8 oz (101.4 kg)   PainSc:  0-No pain   Body mass index is 37.19 kg/m.   PHYSICAL EXAM GENERAL:alert, no distress and comfortable SKIN: no rash  EYES: sclera clear NECK: without mass LYMPH:  no palpable cervical or supraclavicular lymphadenopathy  LUNGS: clear with normal breathing effort HEART: regular rate & rhythm, significant bilateral equal lower extremity edema ABDOMEN: abdomen soft, non-tender and normal bowel sounds NEURO: alert & oriented x  3 with fluent speech, no focal motor/sensory deficits PAC without erythema    CBC    Component Value Date/Time   WBC 7.4 10/18/2022 0729   WBC 8.2 06/28/2022 1133   RBC 2.96 (L) 10/18/2022 0729   HGB 10.8 (L) 10/18/2022 0729   HCT 31.0 (L) 10/18/2022 0729   PLT 97 (L) 10/18/2022 0729   MCV 104.7 (H) 10/18/2022 0729   MCH 36.5 (H) 10/18/2022 0729   MCHC 34.8 10/18/2022 0729   RDW 20.1 (H) 10/18/2022 0729   LYMPHSABS 2.6 10/18/2022 0729   MONOABS 1.2 (H) 10/18/2022 0729   EOSABS 0.6 (H) 10/18/2022 0729   BASOSABS 0.1 10/18/2022 0729     CMP     Component Value Date/Time   NA 140 10/18/2022 0729   K 3.7 10/18/2022 0729   CL 109 10/18/2022 0729   CO2 28 10/18/2022 0729   GLUCOSE 102 (H) 10/18/2022 0729   BUN 8 10/18/2022 0729   CREATININE 1.04 (H) 10/18/2022 0729   CALCIUM 9.1 10/18/2022 0729   CALCIUM 11.1 (H) 03/30/2022  1051   PROT 6.2 (L) 10/18/2022 0729   ALBUMIN 2.2 (L) 10/18/2022 0729   AST 85 (H) 10/18/2022 0729   ALT 36 10/18/2022 0729   ALKPHOS 144 (H) 10/18/2022 0729   BILITOT 1.0 10/18/2022 0729   GFRNONAA 59 (L) 10/18/2022 0729   GFRAA 45 (L) 08/04/2019 0337     ASSESSMENT & PLAN: Laniqua Hamsher is a 67 y.o. female with    Mixed HCC and cholangiocarcinoma, unresectable, with liver metastasis, stage IV  -Liver cirrhosis and a 2.5cm mass was found in 07/2019 but she did not follow up -due to rising AFP, she had MRI on 02/06/2022 which showed extensive liver lesions (largest 13cm) throughout both lobes with marked progression from prior MRI; enlarged upper abdominal lymph nodes, no distant mets -she started Durvaluamb every 4 weeks, and Tremelimumab 300mg  once on day 1 of cycle 1, on 03/21/2022.  -Although she had typical image findings of HCC, she underwent liver biopsy since CA 19-9 was also elevated; biopsy showed well to moderately differentiated CK7 positive adenocarcinoma; the IHC staining profile includes pancreaticobiliary or upper GI primary, and possibly lung but not favored. EGD was negative -She has mixed HCC and cholangiocarcinoma features.  -Began GEMOX q2 weeks on 05/10/22 and continues durvalumab q4 weeks  -CT 06/28/22 showed stable disease; she completed a total of 6 cycles -Her AFP tumor marker significantly increased, but CA 19-9 was trending down.  Restaging MRI 08/01/2022 showed disease progression in the liver -She began second line cisplatin/gemcitabin on days 1, 8 and atezo/bev on day 1 q21 days on 08/30/22. Tolerated first cycle well. C2D1 was canceled due to side effects, she completed C2D8 cis/gem on 6/5 and tolerated well -Ms. Baratta appears stable. She tolerated C2D8 only (cis/gem) well, mainly with fatigue. She is managing at home with adequate PS. No clinical evidence of disease progression -Labs reviewed, plt 97, normal mag, cmp is stable. Adequate for C3D1 cis/gem + atezo/bev  today as scheduled. Will receive IV lasix and will take oral lasix at home as needed for leg swelling up to 5 days.  -AFP and CA 19-9 trending down on this regimen, c/w a clinical response to treatment. She is pleased. -F/up with C3D8 next week. Restaging after C3 -F/up and C4 in 3 weeks -She wants to take a family cruise in August, will arrange treatment around that once she knows the dates.   Alcoholic cirrhosis of liver with ascites (HCC) -  f/u with liver clinic NP Drema Pry  -she also had HCV infection    Hypercalcemia -PTH level low, so not primary hyperparathyroidism  -rpPTH normal -she received pamidronate x2, Ca normalized   PLAN: -Labs reviewed, plt 97, normal mag, cmp is stable.  -Proceed with C3D1 cis/gem + atezo/bev today as scheduled.  -F/up with C3D8 next week. Restaging MRI after C3 -F/up and C4 in 3 weeks -Refilled compazine, lasix, K, and lactulose -She wants to take a family cruise in August, will arrange treatment around that once she knows the dates.  Orders Placed This Encounter  Procedures   MR Abdomen W Wo Contrast    Standing Status:   Future    Standing Expiration Date:   10/18/2023    Order Specific Question:   If indicated for the ordered procedure, I authorize the administration of contrast media per Radiology protocol    Answer:   Yes    Order Specific Question:   What is the patient's sedation requirement?    Answer:   No Sedation    Order Specific Question:   Does the patient have a pacemaker or implanted devices?    Answer:   No    Order Specific Question:   Preferred imaging location?    Answer:   [facility] (table limit - 550 lbs)   Total Protein, Urine dipstick    Standing Status:   Standing    Number of Occurrences:   10    Standing Expiration Date:   10/18/2023      All questions were answered. The patient knows to call the clinic with any problems, questions or concerns. No barriers to learning were detected.  Santiago Glad,  NP-C 10/18/2022

## 2022-10-18 NOTE — Patient Instructions (Signed)
[facility]  Discharge Instructions: Thank you for choosing [facility] to provide your oncology and hematology care.   If you have a lab appointment with the Cancer Center, please go directly to the Cancer Center and check in at the registration area.   Wear comfortable clothing and clothing appropriate for easy access to any Portacath or PICC line.   We strive to give you quality time with your provider. You may need to reschedule your appointment if you arrive late (15 or more minutes).  Arriving late affects you and other patients whose appointments are after yours.  Also, if you miss three or more appointments without notifying the office, you may be dismissed from the clinic at the provider's discretion.      For prescription refill requests, have your pharmacy contact our office and allow 72 hours for refills to be completed.    Today you received the following chemotherapy and/or immunotherapy agents: atezolizumab, bevacizumab, gemcitabine, and cisplatin      To help prevent nausea and vomiting after your treatment, we encourage you to take your nausea medication as directed.  BELOW ARE SYMPTOMS THAT SHOULD BE REPORTED IMMEDIATELY: *FEVER GREATER THAN 100.4 F (38 C) OR HIGHER *CHILLS OR SWEATING *NAUSEA AND VOMITING THAT IS NOT CONTROLLED WITH YOUR NAUSEA MEDICATION *UNUSUAL SHORTNESS OF BREATH *UNUSUAL BRUISING OR BLEEDING *URINARY PROBLEMS (pain or burning when urinating, or frequent urination) *BOWEL PROBLEMS (unusual diarrhea, constipation, pain near the anus) TENDERNESS IN MOUTH AND THROAT WITH OR WITHOUT PRESENCE OF ULCERS (sore throat, sores in mouth, or a toothache) UNUSUAL RASH, SWELLING OR PAIN  UNUSUAL VAGINAL DISCHARGE OR ITCHING   Items with * indicate a potential emergency and should be followed up as soon as possible or go to the Emergency Department if any problems should occur.  Please show the CHEMOTHERAPY  ALERT CARD or IMMUNOTHERAPY ALERT CARD at check-in to the Emergency Department and triage nurse.  Should you have questions after your visit or need to cancel or reschedule your appointment, please contact [facility]  Dept: [phone]  and follow the prompts.  Office hours are 8:00 a.m. to 4:30 p.m. Monday - Friday. Please note that voicemails left after 4:00 p.m. may not be returned until the following business day.  We are closed weekends and major holidays. You have access to a nurse at all times for urgent questions. Please call the main number to the clinic Dept: [phone] and follow the prompts.   For any non-urgent questions, you may also contact your provider using MyChart. We now offer e-Visits for anyone 71 and older to request care online for non-urgent symptoms. For details visit [email or web address].   Also download the MyChart app! Go to the app store, search "MyChart", open the app, select [place], and log in with your MyChart username and password.

## 2022-10-18 NOTE — Progress Notes (Signed)
Ok to treat with platelets of 97 per Santiago Glad, NP

## 2022-10-20 ENCOUNTER — Other Ambulatory Visit: Payer: Self-pay

## 2022-10-20 LAB — T4: T4, Total: 7.9 ug/dL (ref 4.5–12.0)

## 2022-10-24 ENCOUNTER — Other Ambulatory Visit: Payer: Self-pay | Admitting: Hematology

## 2022-10-24 ENCOUNTER — Other Ambulatory Visit: Payer: Self-pay

## 2022-10-24 MED FILL — Fosaprepitant Dimeglumine For IV Infusion 150 MG (Base Eq): INTRAVENOUS | Qty: 5 | Status: AC

## 2022-10-25 ENCOUNTER — Inpatient Hospital Stay: Payer: 59

## 2022-10-25 ENCOUNTER — Inpatient Hospital Stay: Payer: 59 | Admitting: Physician Assistant

## 2022-10-25 ENCOUNTER — Telehealth: Payer: Self-pay

## 2022-10-25 ENCOUNTER — Telehealth: Payer: Self-pay | Admitting: Hematology

## 2022-10-25 NOTE — Telephone Encounter (Signed)
Spoke with pt via telephone regarding message received via Secure Chat from [employer] in Scheduling.  Pt called stating she would like to cancel her appts today 10/25/2022 d/t not feeling well. Pt requested to have appts rescheduled to 10/26/2022.  Pt stated she's been having diarrhea along with dizziness.  Pt stated she took a muscle relaxer last night and feel extremely tired today.  Asked pt was she still taking the Lactulose since the pt is c/o diarrhea?  Pt stated "Yes" because she just got the Lactulose that Dr. Mosetta Putt ordered for the pt on 09/28/2022.  Re-educated pt on how she's to take the lactulose (see this RN's telephone note from 09/28/2022).  Pt verbalized understanding.  Pt stated she's just drinking water around 1 to 2 glasses a day.  Instructed pt to drink Gatorade, Pedialyte, or Liquid IV to replenish the electrolytes she lost from having diarrhea.  Stated the pt is probably also dehydrated and needs IVFs.  Pt stated she just cannot come today because she's so tired from taking the muscle relaxer.  Pt verbalized that she will come tomorrow if we have availability.  Informed pt that someone from the Cancer Center Scheduling Team will be contacting her today to get her rescheduled for her appts.  Also, stated to the pt that she needs to seen by a provider.  Pt verbalized understanding and stated she will definitely come tomorrow if she can be seen.  Pt had no further questions or concerns.

## 2022-10-26 ENCOUNTER — Inpatient Hospital Stay (HOSPITAL_BASED_OUTPATIENT_CLINIC_OR_DEPARTMENT_OTHER): Payer: 59 | Admitting: Hematology and Oncology

## 2022-10-26 ENCOUNTER — Other Ambulatory Visit (HOSPITAL_COMMUNITY): Payer: Self-pay

## 2022-10-26 ENCOUNTER — Encounter: Payer: Self-pay | Admitting: Hematology and Oncology

## 2022-10-26 ENCOUNTER — Inpatient Hospital Stay: Payer: 59

## 2022-10-26 ENCOUNTER — Other Ambulatory Visit: Payer: Self-pay

## 2022-10-26 ENCOUNTER — Encounter: Payer: Self-pay | Admitting: Hematology

## 2022-10-26 VITALS — BP 148/72 | HR 73 | Temp 98.0°F | Resp 16

## 2022-10-26 VITALS — BP 94/47 | HR 63 | Temp 97.9°F | Resp 18 | Ht 65.0 in | Wt 224.4 lb

## 2022-10-26 DIAGNOSIS — G893 Neoplasm related pain (acute) (chronic): Secondary | ICD-10-CM

## 2022-10-26 DIAGNOSIS — I952 Hypotension due to drugs: Secondary | ICD-10-CM

## 2022-10-26 DIAGNOSIS — E860 Dehydration: Secondary | ICD-10-CM | POA: Diagnosis not present

## 2022-10-26 DIAGNOSIS — C221 Intrahepatic bile duct carcinoma: Secondary | ICD-10-CM | POA: Diagnosis not present

## 2022-10-26 DIAGNOSIS — C220 Liver cell carcinoma: Secondary | ICD-10-CM | POA: Diagnosis not present

## 2022-10-26 DIAGNOSIS — R748 Abnormal levels of other serum enzymes: Secondary | ICD-10-CM | POA: Insufficient documentation

## 2022-10-26 DIAGNOSIS — Z95828 Presence of other vascular implants and grafts: Secondary | ICD-10-CM

## 2022-10-26 DIAGNOSIS — K5900 Constipation, unspecified: Secondary | ICD-10-CM

## 2022-10-26 LAB — CBC WITH DIFFERENTIAL (CANCER CENTER ONLY)
Abs Immature Granulocytes: 0.02 10*3/uL (ref 0.00–0.07)
Basophils Absolute: 0 10*3/uL (ref 0.0–0.1)
Basophils Relative: 0 %
Eosinophils Absolute: 0.1 10*3/uL (ref 0.0–0.5)
Eosinophils Relative: 2 %
HCT: 27.1 % — ABNORMAL LOW (ref 36.0–46.0)
Hemoglobin: 9.5 g/dL — ABNORMAL LOW (ref 12.0–15.0)
Immature Granulocytes: 1 %
Lymphocytes Relative: 56 %
Lymphs Abs: 2.2 10*3/uL (ref 0.7–4.0)
MCH: 36.8 pg — ABNORMAL HIGH (ref 26.0–34.0)
MCHC: 35.1 g/dL (ref 30.0–36.0)
MCV: 105 fL — ABNORMAL HIGH (ref 80.0–100.0)
Monocytes Absolute: 0.7 10*3/uL (ref 0.1–1.0)
Monocytes Relative: 17 %
Neutro Abs: 0.9 10*3/uL — ABNORMAL LOW (ref 1.7–7.7)
Neutrophils Relative %: 24 %
Platelet Count: 90 10*3/uL — ABNORMAL LOW (ref 150–400)
RBC: 2.58 MIL/uL — ABNORMAL LOW (ref 3.87–5.11)
RDW: 19.9 % — ABNORMAL HIGH (ref 11.5–15.5)
Smear Review: NORMAL
WBC Count: 3.9 10*3/uL — ABNORMAL LOW (ref 4.0–10.5)
nRBC: 1.8 % — ABNORMAL HIGH (ref 0.0–0.2)

## 2022-10-26 LAB — CMP (CANCER CENTER ONLY)
ALT: 32 U/L (ref 0–44)
AST: 83 U/L — ABNORMAL HIGH (ref 15–41)
Albumin: 2.1 g/dL — ABNORMAL LOW (ref 3.5–5.0)
Alkaline Phosphatase: 140 U/L — ABNORMAL HIGH (ref 38–126)
Anion gap: 3 — ABNORMAL LOW (ref 5–15)
BUN: 7 mg/dL — ABNORMAL LOW (ref 8–23)
CO2: 30 mmol/L (ref 22–32)
Calcium: 9.7 mg/dL (ref 8.9–10.3)
Chloride: 107 mmol/L (ref 98–111)
Creatinine: 0.85 mg/dL (ref 0.44–1.00)
GFR, Estimated: >60 mL/min (ref >60)
Glucose, Bld: 93 mg/dL (ref 70–99)
Potassium: 3.5 mmol/L (ref 3.5–5.1)
Sodium: 140 mmol/L (ref 135–145)
Total Bilirubin: 1.3 mg/dL — ABNORMAL HIGH (ref 0.3–1.2)
Total Protein: 6.1 g/dL — ABNORMAL LOW (ref 6.5–8.1)

## 2022-10-26 LAB — MAGNESIUM: Magnesium: 1.2 mg/dL — ABNORMAL LOW (ref 1.7–2.4)

## 2022-10-26 MED ORDER — SODIUM CHLORIDE 0.9 % IV SOLN
16.0000 mg | Freq: Once | INTRAVENOUS | Status: AC
  Administered 2022-10-26: 16 mg via INTRAVENOUS
  Filled 2022-10-26: qty 8

## 2022-10-26 MED ORDER — SODIUM CHLORIDE 0.9% FLUSH
10.0000 mL | Freq: Once | INTRAVENOUS | Status: AC
  Administered 2022-10-26: 10 mL

## 2022-10-26 MED ORDER — MAGNESIUM OXIDE -MG SUPPLEMENT 400 (240 MG) MG PO TABS
400.0000 mg | ORAL_TABLET | Freq: Every day | ORAL | 0 refills | Status: DC
  Filled 2022-10-26 (×2): qty 30, 30d supply, fill #0

## 2022-10-26 MED ORDER — SODIUM CHLORIDE 0.9 % IV SOLN
600.0000 mg/m2 | Freq: Once | INTRAVENOUS | Status: AC
  Administered 2022-10-26: 1292 mg via INTRAVENOUS
  Filled 2022-10-26: qty 33.98

## 2022-10-26 MED ORDER — SODIUM CHLORIDE 0.9 % IV SOLN: Freq: Once | INTRAVENOUS | Status: AC

## 2022-10-26 MED ORDER — FUROSEMIDE 10 MG/ML IJ SOLN: 20.0000 mg | Freq: Once | INTRAMUSCULAR | Status: DC

## 2022-10-26 MED ORDER — SODIUM CHLORIDE 0.9 % IV SOLN
150.0000 mg | Freq: Once | INTRAVENOUS | Status: DC
  Filled 2022-10-26: qty 5

## 2022-10-26 MED ORDER — HEPARIN SOD (PORK) LOCK FLUSH 100 UNIT/ML IV SOLN
500.0000 [IU] | Freq: Once | INTRAVENOUS | Status: AC | PRN
  Administered 2022-10-26: 500 [IU]

## 2022-10-26 MED ORDER — SODIUM CHLORIDE 0.9% FLUSH
10.0000 mL | INTRAVENOUS | Status: DC | PRN
  Administered 2022-10-26: 10 mL

## 2022-10-26 MED ORDER — SODIUM CHLORIDE 0.9 % IV SOLN
25.0000 mg/m2 | Freq: Once | INTRAVENOUS | Status: AC
  Administered 2022-10-26: 50 mg via INTRAVENOUS
  Filled 2022-10-26: qty 50

## 2022-10-26 MED ORDER — POTASSIUM CHLORIDE IN NACL 20-0.9 MEQ/L-% IV SOLN
Freq: Once | INTRAVENOUS | Status: AC
  Filled 2022-10-26: qty 1000

## 2022-10-26 MED ORDER — MAGNESIUM SULFATE 4 GM/100ML IV SOLN
4.0000 g | Freq: Once | INTRAVENOUS | Status: AC
  Administered 2022-10-26: 4 g via INTRAVENOUS
  Filled 2022-10-26: qty 100

## 2022-10-26 MED ORDER — OXYCODONE HCL 5 MG PO TABS
5.0000 mg | ORAL_TABLET | Freq: Four times a day (QID) | ORAL | 0 refills | Status: DC | PRN
  Filled 2022-10-26: qty 28, 7d supply, fill #0

## 2022-10-26 MED ORDER — SODIUM CHLORIDE 0.9 % IV SOLN
150.0000 mg | Freq: Once | INTRAVENOUS | Status: AC
  Administered 2022-10-26: 150 mg via INTRAVENOUS
  Filled 2022-10-26: qty 150

## 2022-10-26 NOTE — Progress Notes (Signed)
Picked up Magnesium and oxycodone Rx at [facility] pharmacy. They applied Rx's to grant money. Given both Rx's to her in infusion room while getting treatment.

## 2022-10-26 NOTE — Patient Instructions (Signed)
[facility]  Discharge Instructions: Thank you for choosing [facility] to provide your oncology and hematology care.   If you have a lab appointment with the Cancer Center, please go directly to the Cancer Center and check in at the registration area.   Wear comfortable clothing and clothing appropriate for easy access to any Portacath or PICC line.   We strive to give you quality time with your provider. You may need to reschedule your appointment if you arrive late (15 or more minutes).  Arriving late affects you and other patients whose appointments are after yours.  Also, if you miss three or more appointments without notifying the office, you may be dismissed from the clinic at the provider's discretion.      For prescription refill requests, have your pharmacy contact our office and allow 72 hours for refills to be completed.    Today you received the following chemotherapy and/or immunotherapy agents: Gemzar and Cisplatin      To help prevent nausea and vomiting after your treatment, we encourage you to take your nausea medication as directed.  BELOW ARE SYMPTOMS THAT SHOULD BE REPORTED IMMEDIATELY: *FEVER GREATER THAN 100.4 F (38 C) OR HIGHER *CHILLS OR SWEATING *NAUSEA AND VOMITING THAT IS NOT CONTROLLED WITH YOUR NAUSEA MEDICATION *UNUSUAL SHORTNESS OF BREATH *UNUSUAL BRUISING OR BLEEDING *URINARY PROBLEMS (pain or burning when urinating, or frequent urination) *BOWEL PROBLEMS (unusual diarrhea, constipation, pain near the anus) TENDERNESS IN MOUTH AND THROAT WITH OR WITHOUT PRESENCE OF ULCERS (sore throat, sores in mouth, or a toothache) UNUSUAL RASH, SWELLING OR PAIN  UNUSUAL VAGINAL DISCHARGE OR ITCHING   Items with * indicate a potential emergency and should be followed up as soon as possible or go to the Emergency Department if any problems should occur.  Please show the CHEMOTHERAPY ALERT CARD or IMMUNOTHERAPY ALERT  CARD at check-in to the Emergency Department and triage nurse.  Should you have questions after your visit or need to cancel or reschedule your appointment, please contact [facility]  Dept: [phone]  and follow the prompts.  Office hours are 8:00 a.m. to 4:30 p.m. Monday - Friday. Please note that voicemails left after 4:00 p.m. may not be returned until the following business day.  We are closed weekends and major holidays. You have access to a nurse at all times for urgent questions. Please call the main number to the clinic Dept: [phone] and follow the prompts.   For any non-urgent questions, you may also contact your provider using MyChart. We now offer e-Visits for anyone 18 and older to request care online for non-urgent symptoms. For details visit [email or web address].   Also download the MyChart app! Go to the app store, search "MyChart", open the app, select [place], and log in with your MyChart username and password.   

## 2022-10-26 NOTE — Assessment & Plan Note (Signed)
She has cancer associated pain Recommend low-dose pain medicine I warned her about risk of constipation We discussed narcotic refill policy

## 2022-10-26 NOTE — Progress Notes (Signed)
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 female is seen on behalf of her primary oncologist who is away She missed her appointment yesterday She did not feel good She is weak overall She has been constipated for 3 to 4 days before having bowel movement yesterday She denies nausea Her oral intake is poor At most, she might drink 40 ounces of liquid per day She does not have much appetite The patient denies any recent signs or symptoms of bleeding such as spontaneous epistaxis, hematuria or hematochezia. She complained of diffuse pain especially in her abdomen Her blood pressure is low despite not taking her blood pressure medications today  I reviewed the patient's records extensive and collaborated the history with the patient. 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  Underlying hepatic cirrhosis suspected.   2.5 cm indeterminate central hypoechoic hepatic lesion. See above comment.   02/22/2021  Imaging   CLINICAL DATA:  Right-sided abdominal pain.   EXAM: ABDOMEN ULTRASOUND COMPLETE  IMPRESSION: 1. Cirrhosis and small ascites. 2. Gallbladder sludge. No sonographic evidence of acute cholecystitis.   02/22/2021 Imaging   CLINICAL DATA:  Abdominal pain.  Concern for diverticulitis.   EXAM: CT ABDOMEN AND PELVIS WITH CONTRAST  IMPRESSION: 1. Acute pancreatitis. No abscess or pseudocyst. 2. Cirrhosis with small ascites. 3. Colonic diverticulosis. No bowel obstruction. Normal appendix. 4. Aortic Atherosclerosis (ICD10-I70.0).   03/02/2021 Tumor Marker   CA 19-9 = 45 (^) AFP = 11 (^)   03/03/2021 Imaging   CLINICAL DATA:  Pancreatitis, worsening abdominal pain.   EXAM: CT ABDOMEN AND PELVIS WITH CONTRAST  IMPRESSION: 1. Multiple suspicious lesions in the right hepatic lobe. Patient has underlying cirrhosis and findings are concerning for multifocal hepatocellular carcinoma. Metastatic disease is also in the differential diagnosis. Recommend MRI of the abdomen with and without contrast. If the patient cannot tolerate a MRI, consider a multiphase liver CT. 2. Increased inflammatory changes in the upper abdomen compatible with pancreatitis and a large developing pseudocyst. This is developing pseudocyst measures up to 14 cm in size. 3. Cirrhosis with evidence of portal hypertension demonstrated by esophageal varices and varices draining into the IVC. 4. Indeterminate 3 mm pulmonary nodule in the lingula. Management of this pulmonary nodule depends on the etiology of the liver lesions.   03/04/2021 Imaging   EXAM: MRI ABDOMEN WITHOUT AND WITH CONTRAST (INCLUDING MRCP)  IMPRESSION: 1. Multiple enhancing lesions are noted throughout the right lobe of the liver, as detailed above. These have indeterminate imaging characteristics. Given the background of hepatic cirrhosis, the possibility of multifocal infiltrative hepatocellular carcinoma should be considered, although  none of these lesions meet definite imaging criteria to establish that diagnosis. Correlation with AFP level is recommended. Other differential considerations include malignant and benign etiologies such as fibrolamellar hepatocellular carcinoma and multifocal focal nodular hyperplasia (FNH). Consideration for repeat abdominal MRI with and without IV gadolinium (Eovist) is also suggested, although biopsy may ultimately be required to establish a tissue diagnosis. 2. 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 3. Trace volume of perihepatic ascites.   09/22/2021 Tumor Marker   CA 19-9 = 190 (^) AFP = 124 (^)   01/19/2022 Tumor Marker   CA 19-9 = 816 (^) AFP = 307 (^)   02/06/2022 Imaging   EXAM: MRI ABDOMEN WITHOUT AND WITH CONTRAST (INCLUDING MRCP)  IMPRESSION: 1. Morphologic features of the liver compatible with cirrhosis. 2. Extensive enhancing liver lesions are identified throughout both lobes of liver which demonstrate marked progression compared with the previous exam. Enhancing lesions demonstrate washout on the delayed images. In the setting of cirrhosis with progressively elevated alpha fetoprotein levels imaging findings are highly worrisome for multifocal hepatocellular carcinoma. 3. Enlarged upper abdominal lymph nodes identified. Cannot exclude underlying nodal metastasis. 4. There are several lesions within the central mesentery in the distribution of previous large pseudo cysts. The largest measures 5.0 x 3.1 cm. This is favored to represent sequelae of prior hemorrhagic pancreatitis with pseudocyst formation.   02/23/2022 Initial Diagnosis   Hepatocellular carcinoma (HCC)   03/20/2022 Cancer Staging   Staging form: Liver, AJCC 8th Edition - Clinical stage from 03/20/2022: Stage IVA (cT3, cN1, cM0) - Signed by Malachy Mood, MD on 03/20/2022   03/21/2022 - 04/20/2022 Chemotherapy   Patient is on Treatment Plan  : LUNG NSCLC Durvalumab (1500) q28d     04/04/2022 Pathology Results   A. LIVER, RIGHT LOBE, BIOPSY: Well to moderately differentiated CK7 positive adenocarcinoma (see comment)  COMMENT:  Given the cytohistomorphology and this immunohistochemical pattern of cytokeratin 7 positivity, the differential diagnosis would include a pancreaticobiliary or upper gastrointestinal intestinal tract primary or possibly a lung primary (not favored).    05/11/2022 - 05/11/2022 Chemotherapy   Patient is on Treatment Plan : BLADDER Durvalumab (10) q14d     Cholangiocarcinoma of liver (HCC)  04/23/2022 Initial Diagnosis   Cholangiocarcinoma of liver (HCC)   05/11/2022 - 05/11/2022 Chemotherapy   Patient is on Treatment Plan : BILIARY TRACT Cisplatin + Gemcitabine D1,8 q21d     05/11/2022 - 07/27/2022 Chemotherapy   Patient is on Treatment Plan : PANCREAS GemOx q14d     08/31/2022 -  Chemotherapy   Patient is on Treatment Plan : BILIARY TRACT Cisplatin + Gemcitabine D1,8 + Durvalumab (1500) D1 q21d / Durvalumab (1500) q28d       MEDICAL HISTORY:  Past Medical History:  Diagnosis Date   Alcohol abuse    Alcoholic cirrhosis of liver (HCC)    Atrial fibrillation with RVR (HCC) 08/02/2019   Cataract    Chronic kidney disease    Helicobacter pylori gastritis 08/07/2019   Hepatitis C antibody positive in blood - negative RNA    Hypertension    liver ca 01/2022   bile duct ca 05/2022   Pancreatic pseudocyst 03/04/2021    SURGICAL HISTORY: Past Surgical History:  Procedure Laterality Date   BIOPSY  08/03/2019   Procedure: BIOPSY;  Surgeon: Iva Boop, MD;  Location[facility] ENDOSCOPY;  Service: Endoscopy;;   ESOPHAGOGASTRODUODENOSCOPY (EGD) WITH PROPOFOL N/A 08/03/2019   Procedure: ESOPHAGOGASTRODUODENOSCOPY (EGD) WITH PROPOFOL;  Surgeon:  Iva Boop, MD;  Location: [facility] ENDOSCOPY;  Service: Endoscopy;  Laterality: N/A;   IR IMAGING GUIDED PORT INSERTION  05/29/2022   IR RADIOLOGIST EVAL & MGMT  02/14/2022    IR RADIOLOGIST EVAL & MGMT  03/14/2022    SOCIAL HISTORY: Social History   Socioeconomic History   Marital status: Married    Spouse name: Not on file   Number of children: 3   Years of education: Not on file   Highest education level: Not on file  Occupational History   Not on file  Tobacco Use   Smoking status: Every Day    Packs/day: 0.50    Years: 50.00    Additional pack years: 0.00    Total pack years: 25.00    Types: Cigarettes   Smokeless tobacco: Not on file  Substance and Sexual Activity   Alcohol use: Yes    Alcohol/week: 1.0 standard drink of alcohol    Types: 1 Glasses of wine per week    Comment: she used to drink alcohol heavily, stopped in 01/2021   Drug use: Not Currently   Sexual activity: Not on file  Other Topics Concern   Not on file  Social History Narrative   ** Merged History Encounter ** Married   Use to live in [place], moved to [place] in 09/2018   Daughter in [place]   Works [employer]   +EtOH, Smoker   Social Determinants of Corporate investment banker Strain: Not on file  Food Insecurity: Not on file  Transportation Needs: Not on file  Physical Activity: Not on file  Stress: Not on file  Social Connections: Not on file  Intimate Partner Violence: Not on file    FAMILY HISTORY: Family History  Problem Relation Age of Onset   Lung cancer Mother    Colon cancer Neg Hx    Esophageal cancer Neg Hx    Rectal cancer Neg Hx    Stomach cancer Neg Hx     ALLERGIES:  has No Known Allergies.  MEDICATIONS:  Current Outpatient Medications  Medication Sig Dispense Refill   magnesium oxide (MAG-OX) 400 (240 Mg) MG tablet Take 1 tablet (400 mg total) by mouth daily. 30 tablet 0   oxyCODONE (OXY IR/ROXICODONE) 5 MG immediate release tablet Take 1 tablet (5 mg total) by mouth every 6 (six) hours as needed for severe pain. 30 tablet 0   acetaminophen (TYLENOL) 500 MG tablet Take 1,000 mg by mouth every 6 (six) hours as needed for mild pain.      amLODipine (NORVASC) 5 MG tablet Take 1 tablet by mouth daily.     diclofenac Sodium (VOLTAREN) 1 % GEL Apply 2 g topically 2 (two) times daily as needed (pain).     furosemide (LASIX) 20 MG tablet Take 1 tablet (20 mg total) by mouth daily as needed as directed for leg swelling for no more than 5 days. 20 tablet 0   gabapentin (NEURONTIN) 100 MG capsule TAKE 1 CAPSULE BY MOUTH 3 TIMES  DAILY 270 capsule 3   lactulose (CHRONULAC) 10 GM/15ML solution Take 30 mLs (20 g total) by mouth 2 (two) times daily as needed for mild constipation (titrate dose to have bowel movement 2-3 times a day). 473 mL 1   latanoprost (XALATAN) 0.005 % ophthalmic solution Place 1 drop into both eyes at bedtime.     lidocaine-prilocaine (EMLA) cream Apply 1 Application topically as needed. Apply 5ml to Port-a-Cath site 45 mins to 1 hr  before port-a-cath access. 30 g 0   lipase/protease/amylase (CREON) 12000-38000 units CPEP capsule Take 1 capsule (12,000 Units total) by mouth 3 (three) times daily with meals. 270 capsule 2   ondansetron (ZOFRAN) 8 MG tablet Take 1 tablet (8 mg total) by mouth every 8 (eight) hours as needed for nausea or vomiting. 60 tablet 1   pantoprazole (PROTONIX) 40 MG tablet TAKE 1 TABLET BY MOUTH DAILY 100 tablet 2   polyethylene glycol (MIRALAX / GLYCOLAX) 17 g packet Take 17 g by mouth 2 (two) times daily. 14 each 0   potassium chloride SA (KLOR-CON M) 20 MEQ tablet Take 1 tablet (20 mEq total) by mouth 2 (two) times daily.  Take only when taking furosemide (lasix) as directed. 20 tablet 0   prochlorperazine (COMPAZINE) 10 MG tablet Take 1 tablet (10 mg total) by mouth every 6 (six) hours as needed for nausea or vomiting. 30 tablet 2   No current facility-administered medications for this visit.   Facility-Administered Medications Ordered in Other Visits  Medication Dose Route Frequency Provider Last Rate Last Admin   0.9 %  sodium chloride infusion   Intravenous Once Malachy Mood, MD       0.9 %  NaCl with KCl 20 mEq/ L  infusion   Intravenous Once Malachy Mood, MD       CISplatin (PLATINOL) 50 mg in sodium chloride 0.9 % 250 mL chemo infusion  25 mg/m2 (Treatment Plan Recorded) Intravenous Once Artis Delay, MD       fosaprepitant (EMEND) 150 mg in sodium chloride 0.9 % 145 mL IVPB  150 mg Intravenous Once Malachy Mood, MD       furosemide (LASIX) injection 20 mg  20 mg Intravenous Once Malachy Mood, MD       gemcitabine (GEMZAR) 1,292 mg in sodium chloride 0.9 % 250 mL chemo infusion  600 mg/m2 (Treatment Plan Recorded) Intravenous Once Artis Delay, MD       heparin lock flush 100 unit/mL  500 Units Intracatheter Once PRN Malachy Mood, MD       magnesium sulfate IVPB 4 g 100 mL  4 g Intravenous Once Bertis Ruddy, Rael Tilly, MD       ondansetron (ZOFRAN) 16 mg in sodium chloride 0.9 % 50 mL IVPB  16 mg Intravenous Once Malachy Mood, MD       sodium chloride flush (NS) 0.9 % injection 10 mL  10 mL Intracatheter PRN Malachy Mood, MD        REVIEW OF SYSTEMS:   Constitutional: Denies fevers, chills or abnormal night sweats Eyes: Denies blurriness of vision, double vision or watery eyes Ears, nose, mouth, throat, and face: Denies mucositis or sore throat Respiratory: Denies cough, dyspnea or wheezes Cardiovascular: Denies palpitation, chest discomfort Skin: Denies abnormal skin rashes Lymphatics: Denies new lymphadenopathy or easy bruising Behavioral/Psych: Mood is stable, no new changes  All other systems were reviewed with the patient and are negative.  PHYSICAL EXAMINATION: ECOG PERFORMANCE STATUS: 2 - Symptomatic, <50% confined to bed  Vitals:   10/26/22 0817  BP: (!) 94/47  Pulse: 63  Resp: 18  Temp: 97.9 F (36.6 C)  SpO2: 98%   Filed Weights   10/26/22 0817  Weight: 224 lb 6.4 oz (101.8 kg)    GENERAL:alert, no distress and comfortable.  She is ill-appearing SKIN: skin color, texture, turgor are normal, no rashes or significant lesions EYES: normal, conjunctiva are pink and non-injected, sclera  clear OROPHARYNX:no exudate, normal lips, buccal mucosa, and tongue  NECK: supple, thyroid normal size, non-tender, without nodularity LYMPH:  no palpable lymphadenopathy in the cervical, axillary or inguinal LUNGS: clear to auscultation and percussion with normal breathing effort HEART: regular rate & rhythm and no murmurs with mild bilateral lower extremity edema ABDOMEN:abdomen soft, non-tender and normal bowel sounds Musculoskeletal:no cyanosis of digits and no clubbing  PSYCH: alert & oriented x 3 with fluent speech NEURO: no focal motor/sensory deficits  LABORATORY DATA:  I have reviewed the data as listed Lab Results  Component Value Date   WBC 3.9 (L) 10/26/2022   HGB 9.5 (L) 10/26/2022   HCT 27.1 (L) 10/26/2022   MCV 105.0 (H) 10/26/2022   PLT 90 (L) 10/26/2022   Recent Labs    10/04/22 0753 10/18/22 0729 10/26/22 0731  NA 138 140 140  K 4.1 3.7 3.5  CL 109 109 107  CO2 26 28 30   GLUCOSE 107* 102* 93  BUN 7* 8 7*  CREATININE 0.98 1.04* 0.85  CALCIUM 9.8 9.1 9.7  GFRNONAA >60 59* >60  PROT 6.5 6.2* 6.1*  ALBUMIN 2.4* 2.2* 2.1*  AST 80* 85* 83*  ALT 31 36 32  ALKPHOS 137* 144* 140*  BILITOT 0.8 1.0 1.3*    RADIOGRAPHIC STUDIES: I have personally reviewed the radiological images as listed and agreed with the findings in the report. No results found.  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) She missed her appointment yesterday I reinforced the importance of keeping her treatment as scheduled I plan dose reduction given her borderline low blood pressure and pancytopenia We will focus on aggressive supportive care  Dehydration She is hypotensive and clinically dehydrated I recommend the patient to keep her appointment She will receive pre and post hydration fluids with chemotherapy I recommend increase oral intake as tolerated  Drug-induced hypotension Her blood pressure is low Recommend the patient to hold off taking  amlodipine I recommend increase oral intake as tolerated  Acute constipation She is prone to get constipated I reinforced the importance of regular laxatives  Cancer associated pain She has cancer associated pain Recommend low-dose pain medicine I warned her about risk of constipation We discussed narcotic refill policy  Elevated liver enzymes This is higher than her baseline, could be related to disease progression versus dehydration Monitor closely We will proceed with treatment without delay  No orders of the defined types were placed in this encounter.   All questions were answered. The patient knows to call the clinic with any problems, questions or concerns. The total time spent in the appointment was 40 minutes encounter with patients including review of chart and various tests results, discussions about plan of care and coordination of care plan   Artis Delay, MD 10/26/2022 9:00 AM

## 2022-10-26 NOTE — Assessment & Plan Note (Signed)
She missed her appointment yesterday I reinforced the importance of keeping her treatment as scheduled I plan dose reduction given her borderline low blood pressure and pancytopenia We will focus on aggressive supportive care

## 2022-10-26 NOTE — Assessment & Plan Note (Signed)
This is higher than her baseline, could be related to disease progression versus dehydration Monitor closely We will proceed with treatment without delay

## 2022-10-26 NOTE — Progress Notes (Signed)
Per Dr. Bertis Ruddy- proceed with AST of 83.

## 2022-10-26 NOTE — Assessment & Plan Note (Signed)
Her blood pressure is low Recommend the patient to hold off taking amlodipine I recommend increase oral intake as tolerated

## 2022-10-26 NOTE — Assessment & Plan Note (Signed)
She is prone to get constipated I reinforced the importance of regular laxatives

## 2022-10-26 NOTE — Assessment & Plan Note (Signed)
She is hypotensive and clinically dehydrated I recommend the patient to keep her appointment She will receive pre and post hydration fluids with chemotherapy I recommend increase oral intake as tolerated

## 2022-10-30 ENCOUNTER — Other Ambulatory Visit: Payer: Self-pay

## 2022-10-30 ENCOUNTER — Encounter: Payer: Self-pay | Admitting: Hematology

## 2022-10-31 ENCOUNTER — Other Ambulatory Visit: Payer: Self-pay

## 2022-11-06 ENCOUNTER — Other Ambulatory Visit: Payer: Self-pay | Admitting: Hematology

## 2022-11-06 DIAGNOSIS — C221 Intrahepatic bile duct carcinoma: Secondary | ICD-10-CM

## 2022-11-07 ENCOUNTER — Other Ambulatory Visit: Payer: Self-pay | Admitting: Nurse Practitioner

## 2022-11-07 ENCOUNTER — Ambulatory Visit (HOSPITAL_COMMUNITY): Admission: RE | Admit: 2022-11-07 | Payer: 59 | Source: Ambulatory Visit

## 2022-11-07 ENCOUNTER — Other Ambulatory Visit: Payer: Self-pay | Admitting: Hematology

## 2022-11-07 DIAGNOSIS — C220 Liver cell carcinoma: Secondary | ICD-10-CM

## 2022-11-07 DIAGNOSIS — C221 Intrahepatic bile duct carcinoma: Secondary | ICD-10-CM

## 2022-11-07 MED FILL — Fosaprepitant Dimeglumine For IV Infusion 150 MG (Base Eq): INTRAVENOUS | Qty: 5 | Status: AC

## 2022-11-07 NOTE — Assessment & Plan Note (Signed)
Stage IVA (cT3, cN1, cM0)  with diffuse liver mets, unresectable  -Liver cirrhosis and a 2.5cm mass was found in 07/2019 but she did not follow up -due to rising AFP, she had MRI on 02/06/2022 which showed extensive liver lesions (largest 13cm) throughout both lobes with marked progression from prior MRI; enlarged upper abdominal lymph nodes, no distant mets -she started Durvaluamb every 4 weeks, and Tremelimumab 300mg  once on day 1 of cycle 1, on 03/21/2022.  -Although she had typical image findings of HCC, she underwent liver biopsy since CA 19-9 was also elevated  -liver biopsy showed well to moderately differentiated CK7 positive adenocarcinoma and her EGD was negative for upper GI tract malignancy. Tumor cells were also positive for Glycipan 3, supporting mixed HCC and cholagiocarcinoma. -she started GEMOX (cisplatin is contraindicated due to her chronic kidney disease) and continued durvalumab on 05/10/22, she tolerated treatment well overall  -restaging MRI from 08/01/22 showed disease progression in liver.  -her tumor marker AFP has significantly increased lately, but her CA 19.9 is trending down.  -I have changed her treatment to cisplatin, gemcitabine, atezolizumab and bevacizumab, started on 08/30/2022.  -She tolerated first cycle chemotherapy well, except significant leg edema.  We gave her IV Lasix during the infusion, and she took oral Lasix for 5 days after treatment.  Her leg edema has improved -she has been more fatigued lately, with worsening PS  -she is scheduled for restaging MRI on 7/16

## 2022-11-07 NOTE — Progress Notes (Unsigned)
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 female with   No problem-specific Assessment & Plan notes found for this encounter.  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  Underlying hepatic cirrhosis suspected.   2.5 cm indeterminate central hypoechoic hepatic lesion. See above comment.   02/22/2021 Imaging   CLINICAL DATA:  Right-sided abdominal pain.   EXAM: ABDOMEN ULTRASOUND COMPLETE  IMPRESSION: 1. Cirrhosis and small ascites. 2. Gallbladder sludge. No sonographic evidence of acute cholecystitis.   02/22/2021 Imaging   CLINICAL DATA:  Abdominal pain.  Concern for diverticulitis.   EXAM: CT ABDOMEN AND PELVIS WITH CONTRAST  IMPRESSION: 1. Acute pancreatitis. No abscess or pseudocyst. 2.  Cirrhosis with small ascites. 3. Colonic diverticulosis. No bowel obstruction. Normal appendix. 4. Aortic Atherosclerosis (ICD10-I70.0).   03/02/2021 Tumor Marker   CA 19-9 = 45 (^) AFP = 11 (^)   03/03/2021 Imaging   CLINICAL DATA:  Pancreatitis, worsening abdominal pain.   EXAM: CT ABDOMEN AND PELVIS WITH CONTRAST  IMPRESSION: 1. Multiple suspicious lesions in the right hepatic lobe. Patient has underlying cirrhosis and findings are concerning for multifocal hepatocellular carcinoma. Metastatic disease is also in the differential diagnosis. Recommend MRI of the abdomen with and without contrast. If the patient cannot tolerate a MRI, consider a multiphase liver CT. 2. Increased inflammatory changes in the upper abdomen compatible with pancreatitis and a large developing pseudocyst. This is developing pseudocyst measures up to 14 cm in size. 3. Cirrhosis with evidence of portal hypertension demonstrated by esophageal varices and varices draining into the IVC. 4. Indeterminate 3 mm pulmonary nodule in the lingula. Management of this pulmonary nodule depends on the etiology of the liver lesions.   03/04/2021 Imaging   EXAM: MRI ABDOMEN WITHOUT AND WITH CONTRAST (INCLUDING MRCP)  IMPRESSION: 1. Multiple enhancing lesions are noted throughout the right lobe of the liver, as detailed above. These have indeterminate imaging characteristics. Given the background of hepatic cirrhosis, the possibility of multifocal infiltrative hepatocellular carcinoma should be considered, although none of these lesions meet definite imaging criteria to establish that diagnosis. Correlation with AFP level is recommended. Other differential considerations include malignant and benign etiologies such as fibrolamellar hepatocellular carcinoma and multifocal focal nodular hyperplasia (FNH). Consideration for repeat abdominal MRI with and without IV gadolinium (Eovist) is also suggested, although biopsy  may ultimately be required to establish a tissue diagnosis. 2. Pancreatitis with  large infiltrative pancreatic pseudocysts in the root of the small bowel mesentery. 3. Trace volume of perihepatic ascites.   09/22/2021 Tumor Marker   CA 19-9 = 190 (^) AFP = 124 (^)   01/19/2022 Tumor Marker   CA 19-9 = 816 (^) AFP = 307 (^)   02/06/2022 Imaging   EXAM: MRI ABDOMEN WITHOUT AND WITH CONTRAST (INCLUDING MRCP)  IMPRESSION: 1. Morphologic features of the liver compatible with cirrhosis. 2. Extensive enhancing liver lesions are identified throughout both lobes of liver which demonstrate marked progression compared with the previous exam. Enhancing lesions demonstrate washout on the delayed images. In the setting of cirrhosis with progressively elevated alpha fetoprotein levels imaging findings are highly worrisome for multifocal hepatocellular carcinoma. 3. Enlarged upper abdominal lymph nodes identified. Cannot exclude underlying nodal metastasis. 4. There are several lesions within the central mesentery in the distribution of previous large pseudo cysts. The largest measures 5.0 x 3.1 cm. This is favored to represent sequelae of prior hemorrhagic pancreatitis with pseudocyst formation.   02/23/2022 Initial Diagnosis   Hepatocellular carcinoma (HCC)   03/20/2022 Cancer Staging   Staging form: Liver, AJCC 8th Edition - Clinical stage from 03/20/2022: Stage IVA (cT3, cN1, cM0) - Signed by Malachy Mood, MD on 03/20/2022   03/21/2022 - 04/20/2022 Chemotherapy   Patient is on Treatment Plan : LUNG NSCLC Durvalumab (1500) q28d     04/04/2022 Pathology Results   A. LIVER, RIGHT LOBE, BIOPSY: Well to moderately differentiated CK7 positive adenocarcinoma (see comment)  COMMENT:  Given the cytohistomorphology and this immunohistochemical pattern of cytokeratin 7 positivity, the differential diagnosis would include a pancreaticobiliary or upper gastrointestinal intestinal tract primary or  possibly a lung primary (not favored).    05/11/2022 - 05/11/2022 Chemotherapy   Patient is on Treatment Plan : BLADDER Durvalumab (10) q14d     Cholangiocarcinoma of liver (HCC)  04/23/2022 Initial Diagnosis   Cholangiocarcinoma of liver (HCC)   05/11/2022 - 05/11/2022 Chemotherapy   Patient is on Treatment Plan : BILIARY TRACT Cisplatin + Gemcitabine D1,8 q21d     05/11/2022 - 07/27/2022 Chemotherapy   Patient is on Treatment Plan : PANCREAS GemOx q14d     08/31/2022 -  Chemotherapy   Patient is on Treatment Plan : BILIARY TRACT Cisplatin + Gemcitabine D1,8 + Durvalumab (1500) D1 q21d / Durvalumab (1500) q28d        INTERVAL HISTORY:  Connie [name] is here for a follow up of mixed HCC and cholangiocarcinoma. She was last seen by Dr.Gorsuch on 10/26/2022. She presents to the clinic      All other systems were reviewed with the patient and are negative.  MEDICAL HISTORY:  Past Medical History:  Diagnosis Date   Alcohol abuse    Alcoholic cirrhosis of liver (HCC)    Atrial fibrillation with RVR (HCC) 08/02/2019   Cataract    Chronic kidney disease    Helicobacter pylori gastritis 08/07/2019   Hepatitis C antibody positive in blood - negative RNA    Hypertension    liver ca 01/2022   bile duct ca 05/2022   Pancreatic pseudocyst 03/04/2021    SURGICAL HISTORY: Past Surgical History:  Procedure Laterality Date   BIOPSY  08/03/2019   Procedure: BIOPSY;  Surgeon: Iva Boop, MD;  Location[facility] END[facility];  Service: Endoscopy;;   ESOPHAGOGASTRODUODENOSCOPY (EGD) WITH PROPOFOL N/A 08/03/2019   Procedure: ESOPHAGOGASTRODUODENOSCOPY (EGD) WITH PROPOFOL;  Surgeon: Iva Boop, MD;  Location: [facility] ENDOSCOPY;  Service: Endoscopy;  Laterality: N/A;   IR IMAGING  GUIDED PORT INSERTION  05/29/2022   IR RADIOLOGIST EVAL & MGMT  02/14/2022   IR RADIOLOGIST EVAL & MGMT  03/14/2022    I have reviewed the social history and family history with the patient and they are unchanged from  previous note.  ALLERGIES:  has No Known Allergies.  MEDICATIONS:  Current Outpatient Medications  Medication Sig Dispense Refill   acetaminophen (TYLENOL) 500 MG tablet Take 1,000 mg by mouth every 6 (six) hours as needed for mild pain.     amLODipine (NORVASC) 5 MG tablet Take 1 tablet by mouth daily.     diclofenac Sodium (VOLTAREN) 1 % GEL Apply 2 g topically 2 (two) times daily as needed (pain).     furosemide (LASIX) 20 MG tablet Take 1 tablet (20 mg total) by mouth daily as needed as directed for leg swelling for no more than 5 days. 20 tablet 0   gabapentin (NEURONTIN) 100 MG capsule TAKE 1 CAPSULE BY MOUTH 3 TIMES  DAILY 270 capsule 3   lactulose (CHRONULAC) 10 GM/15ML solution Take 30 mLs (20 g total) by mouth 2 (two) times daily as needed for mild constipation (titrate dose to have bowel movement 2-3 times a day). 473 mL 1   latanoprost (XALATAN) 0.005 % ophthalmic solution Place 1 drop into both eyes at bedtime.     lidocaine-prilocaine (EMLA) cream Apply 1 Application topically as needed. Apply 5ml to Port-a-Cath site 45 mins to 1 hr before port-a-cath access. 30 g 0   lipase/protease/amylase (CREON) 12000-38000 units CPEP capsule Take 1 capsule (12,000 Units total) by mouth 3 (three) times daily with meals. 270 capsule 2   magnesium oxide (MAG-OX) 400 (240 Mg) MG tablet Take 1 tablet (400 mg total) by mouth daily. 30 tablet 0   ondansetron (ZOFRAN) 8 MG tablet Take 1 tablet (8 mg total) by mouth every 8 (eight) hours as needed for nausea or vomiting. 60 tablet 1   oxyCODONE (OXY IR/ROXICODONE) 5 MG immediate release tablet Take 1 tablet (5 mg total) by mouth every 6 (six) hours as needed for severe pain. 30 tablet 0   pantoprazole (PROTONIX) 40 MG tablet TAKE 1 TABLET BY MOUTH DAILY 100 tablet 2   polyethylene glycol (MIRALAX / GLYCOLAX) 17 g packet Take 17 g by mouth 2 (two) times daily. 14 each 0   potassium chloride SA (KLOR-CON M) 20 MEQ tablet Take 1 tablet (20 mEq total) by  mouth 2 (two) times daily.  Take only when taking furosemide (lasix) as directed. 20 tablet 0   prochlorperazine (COMPAZINE) 10 MG tablet Take 1 tablet (10 mg total) by mouth every 6 (six) hours as needed for nausea or vomiting. 30 tablet 2   No current facility-administered medications for this visit.    PHYSICAL EXAMINATION: ECOG PERFORMANCE STATUS: {CHL ONC ECOG PS:[id number]}  There were no vitals filed for this visit. Wt Readings from Last 3 Encounters:  10/26/22 224 lb 6.4 oz (101.8 kg)  10/18/22 223 lb 8 oz (101.4 kg)  10/04/22 226 lb 12 oz (102.9 kg)    {Only keep what was examined. If exam not performed, can use .CEXAM } GENERAL:alert, no distress and comfortable SKIN: skin color, texture, turgor are normal, no rashes or significant lesions EYES: normal, Conjunctiva are pink and non-injected, sclera clear {OROPHARYNX:no exudate, no erythema and lips, buccal mucosa, and tongue normal}  NECK: supple, thyroid normal size, non-tender, without nodularity LYMPH:  no palpable lymphadenopathy in the cervical, axillary {or inguinal} LUNGS: clear to auscultation and  percussion with normal breathing effort HEART: regular rate & rhythm and no murmurs and no lower extremity edema ABDOMEN:abdomen soft, non-tender and normal bowel sounds Musculoskeletal:no cyanosis of digits and no clubbing  NEURO: alert & oriented x 3 with fluent speech, no focal motor/sensory deficits  LABORATORY DATA:  I have reviewed the data as listed    Latest Ref Rng & Units 10/26/2022    7:31 AM 10/18/2022    7:29 AM 10/04/2022    7:53 AM  CBC  WBC 4.0 - 10.5 K/uL 3.9  7.4  7.6   Hemoglobin 12.0 - 15.0 g/dL 9.5  16.1  09.6   Hematocrit 36.0 - 46.0 % 27.1  31.0  30.9   Platelets 150 - 400 K/uL 90  97  211         Latest Ref Rng & Units 10/26/2022    7:31 AM 10/18/2022    7:29 AM 10/04/2022    7:53 AM  CMP  Glucose 70 - 99 mg/dL 93  045  409   BUN 8 - 23 mg/dL 7  8  7    Creatinine 0.44 - 1.00 mg/dL 8.11   9.14  7.82   Sodium 135 - 145 mmol/L 140  140  138   Potassium 3.5 - 5.1 mmol/L 3.5  3.7  4.1   Chloride 98 - 111 mmol/L 107  109  109   CO2 22 - 32 mmol/L 30  28  26    Calcium 8.9 - 10.3 mg/dL 9.7  9.1  9.8   Total Protein 6.5 - 8.1 g/dL 6.1  6.2  6.5   Total Bilirubin 0.3 - 1.2 mg/dL 1.3  1.0  0.8   Alkaline Phos 38 - 126 U/L 140  144  137   AST 15 - 41 U/L 83  85  80   ALT 0 - 44 U/L 32  36  31       RADIOGRAPHIC STUDIES: I have personally reviewed the radiological images as listed and agreed with the findings in the report. No results found.    No orders of the defined types were placed in this encounter.  All questions were answered. The patient knows to call the clinic with any problems, questions or concerns. No barriers to learning was detected. The total time spent in the appointment was {CHL ONC TIME VISIT - NFAOZ:[id number]}.     Salome Holmes, CMA 11/07/2022   I, Monica Martinez, CMA, am acting as scribe for Malachy Mood, MD.   {Add scribe attestation statement}

## 2022-11-07 NOTE — Assessment & Plan Note (Signed)
-  will monitor renal function closely during chemo    -improved lately  -Will monitor closely, especially when she is on furosemide.   

## 2022-11-07 NOTE — Assessment & Plan Note (Signed)
-  f/u with GI, will watch her LFTs closely on chemo     

## 2022-11-07 NOTE — Assessment & Plan Note (Signed)
-  resolved since she started chemo  

## 2022-11-08 ENCOUNTER — Inpatient Hospital Stay: Payer: 59 | Attending: Hematology

## 2022-11-08 ENCOUNTER — Other Ambulatory Visit: Payer: Self-pay

## 2022-11-08 ENCOUNTER — Inpatient Hospital Stay (HOSPITAL_BASED_OUTPATIENT_CLINIC_OR_DEPARTMENT_OTHER): Payer: 59 | Admitting: Hematology

## 2022-11-08 ENCOUNTER — Encounter: Payer: Self-pay | Admitting: Hematology

## 2022-11-08 ENCOUNTER — Inpatient Hospital Stay: Payer: 59

## 2022-11-08 VITALS — BP 105/67 | HR 55 | Temp 97.5°F | Resp 18 | Ht 65.0 in | Wt 223.2 lb

## 2022-11-08 VITALS — BP 134/78 | HR 69 | Temp 97.7°F

## 2022-11-08 DIAGNOSIS — C221 Intrahepatic bile duct carcinoma: Secondary | ICD-10-CM

## 2022-11-08 DIAGNOSIS — R630 Anorexia: Secondary | ICD-10-CM | POA: Diagnosis not present

## 2022-11-08 DIAGNOSIS — Z5112 Encounter for antineoplastic immunotherapy: Secondary | ICD-10-CM | POA: Diagnosis present

## 2022-11-08 DIAGNOSIS — C220 Liver cell carcinoma: Secondary | ICD-10-CM

## 2022-11-08 DIAGNOSIS — Z79899 Other long term (current) drug therapy: Secondary | ICD-10-CM | POA: Insufficient documentation

## 2022-11-08 DIAGNOSIS — N1831 Chronic kidney disease, stage 3a: Secondary | ICD-10-CM | POA: Diagnosis not present

## 2022-11-08 DIAGNOSIS — R978 Other abnormal tumor markers: Secondary | ICD-10-CM | POA: Diagnosis not present

## 2022-11-08 DIAGNOSIS — E878 Other disorders of electrolyte and fluid balance, not elsewhere classified: Secondary | ICD-10-CM | POA: Diagnosis not present

## 2022-11-08 DIAGNOSIS — Z5111 Encounter for antineoplastic chemotherapy: Secondary | ICD-10-CM | POA: Diagnosis not present

## 2022-11-08 DIAGNOSIS — Z95828 Presence of other vascular implants and grafts: Secondary | ICD-10-CM

## 2022-11-08 DIAGNOSIS — R5383 Other fatigue: Secondary | ICD-10-CM | POA: Insufficient documentation

## 2022-11-08 DIAGNOSIS — C9000 Multiple myeloma not having achieved remission: Secondary | ICD-10-CM | POA: Insufficient documentation

## 2022-11-08 DIAGNOSIS — K7031 Alcoholic cirrhosis of liver with ascites: Secondary | ICD-10-CM

## 2022-11-08 DIAGNOSIS — R600 Localized edema: Secondary | ICD-10-CM | POA: Insufficient documentation

## 2022-11-08 DIAGNOSIS — N183 Chronic kidney disease, stage 3 unspecified: Secondary | ICD-10-CM | POA: Diagnosis not present

## 2022-11-08 LAB — CBC WITH DIFFERENTIAL (CANCER CENTER ONLY)
Abs Immature Granulocytes: 0 10*3/uL (ref 0.00–0.07)
Basophils Absolute: 0 10*3/uL (ref 0.0–0.1)
Basophils Relative: 0 %
Eosinophils Absolute: 0.2 10*3/uL (ref 0.0–0.5)
Eosinophils Relative: 3 %
HCT: 27.9 % — ABNORMAL LOW (ref 36.0–46.0)
Hemoglobin: 9.8 g/dL — ABNORMAL LOW (ref 12.0–15.0)
Immature Granulocytes: 0 %
Lymphocytes Relative: 41 %
Lymphs Abs: 2 10*3/uL (ref 0.7–4.0)
MCH: 37.7 pg — ABNORMAL HIGH (ref 26.0–34.0)
MCHC: 35.1 g/dL (ref 30.0–36.0)
MCV: 107.3 fL — ABNORMAL HIGH (ref 80.0–100.0)
Monocytes Absolute: 1 10*3/uL (ref 0.1–1.0)
Monocytes Relative: 22 %
Neutro Abs: 1.6 10*3/uL — ABNORMAL LOW (ref 1.7–7.7)
Neutrophils Relative %: 34 %
Platelet Count: 89 10*3/uL — ABNORMAL LOW (ref 150–400)
RBC: 2.6 MIL/uL — ABNORMAL LOW (ref 3.87–5.11)
RDW: 23 % — ABNORMAL HIGH (ref 11.5–15.5)
Smear Review: NORMAL
WBC Count: 4.8 10*3/uL (ref 4.0–10.5)
nRBC: 0 % (ref 0.0–0.2)

## 2022-11-08 LAB — TOTAL PROTEIN, URINE DIPSTICK: Protein, ur: NEGATIVE mg/dL

## 2022-11-08 LAB — CMP (CANCER CENTER ONLY)
ALT: 46 U/L — ABNORMAL HIGH (ref 0–44)
AST: 114 U/L — ABNORMAL HIGH (ref 15–41)
Albumin: 2 g/dL — ABNORMAL LOW (ref 3.5–5.0)
Alkaline Phosphatase: 147 U/L — ABNORMAL HIGH (ref 38–126)
Anion gap: 4 — ABNORMAL LOW (ref 5–15)
BUN: 6 mg/dL — ABNORMAL LOW (ref 8–23)
CO2: 28 mmol/L (ref 22–32)
Calcium: 8.8 mg/dL — ABNORMAL LOW (ref 8.9–10.3)
Chloride: 107 mmol/L (ref 98–111)
Creatinine: 0.99 mg/dL (ref 0.44–1.00)
GFR, Estimated: >60 mL/min (ref >60)
Glucose, Bld: 101 mg/dL — ABNORMAL HIGH (ref 70–99)
Potassium: 3.5 mmol/L (ref 3.5–5.1)
Sodium: 139 mmol/L (ref 135–145)
Total Bilirubin: 1.6 mg/dL — ABNORMAL HIGH (ref 0.3–1.2)
Total Protein: 6.2 g/dL — ABNORMAL LOW (ref 6.5–8.1)

## 2022-11-08 LAB — TSH: TSH: 5.705 u[IU]/mL — ABNORMAL HIGH (ref 0.350–4.500)

## 2022-11-08 LAB — MAGNESIUM: Magnesium: 1.6 mg/dL — ABNORMAL LOW (ref 1.7–2.4)

## 2022-11-08 MED ORDER — SODIUM CHLORIDE 0.9% FLUSH
10.0000 mL | Freq: Once | INTRAVENOUS | Status: AC
  Administered 2022-11-08: 10 mL

## 2022-11-08 MED ORDER — SODIUM CHLORIDE 0.9 % IV SOLN: Freq: Once | INTRAVENOUS | Status: AC

## 2022-11-08 MED ORDER — MAGNESIUM SULFATE 2 GM/50ML IV SOLN
2.0000 g | Freq: Once | INTRAVENOUS | Status: AC
  Administered 2022-11-08: 2 g via INTRAVENOUS
  Filled 2022-11-08: qty 50

## 2022-11-08 MED ORDER — SODIUM CHLORIDE 0.9 % IV SOLN
150.0000 mg | Freq: Once | INTRAVENOUS | Status: AC
  Administered 2022-11-08: 150 mg via INTRAVENOUS
  Filled 2022-11-08: qty 150

## 2022-11-08 MED ORDER — SODIUM CHLORIDE 0.9 % IV SOLN
16.0000 mg | Freq: Once | INTRAVENOUS | Status: AC
  Administered 2022-11-08: 16 mg via INTRAVENOUS
  Filled 2022-11-08: qty 8

## 2022-11-08 MED ORDER — FUROSEMIDE 10 MG/ML IJ SOLN
20.0000 mg | Freq: Once | INTRAMUSCULAR | Status: AC
  Administered 2022-11-08: 20 mg via INTRAVENOUS
  Filled 2022-11-08: qty 2

## 2022-11-08 MED ORDER — HEPARIN SOD (PORK) LOCK FLUSH 100 UNIT/ML IV SOLN
500.0000 [IU] | Freq: Once | INTRAVENOUS | Status: AC | PRN
  Administered 2022-11-08: 500 [IU]

## 2022-11-08 MED ORDER — SODIUM CHLORIDE 0.9 % IV SOLN
1200.0000 mg | Freq: Once | INTRAVENOUS | Status: AC
  Administered 2022-11-08: 1200 mg via INTRAVENOUS
  Filled 2022-11-08: qty 20

## 2022-11-08 MED ORDER — SODIUM CHLORIDE 0.9% FLUSH
10.0000 mL | INTRAVENOUS | Status: DC | PRN
  Administered 2022-11-08: 10 mL

## 2022-11-08 MED ORDER — SODIUM CHLORIDE 0.9 % IV SOLN
25.0000 mg/m2 | Freq: Once | INTRAVENOUS | Status: AC
  Administered 2022-11-08: 50 mg via INTRAVENOUS
  Filled 2022-11-08: qty 50

## 2022-11-08 MED ORDER — SODIUM CHLORIDE 0.9 % IV SOLN
800.0000 mg/m2 | Freq: Once | INTRAVENOUS | Status: AC
  Administered 2022-11-08: 1710 mg via INTRAVENOUS
  Filled 2022-11-08: qty 44.97

## 2022-11-08 MED ORDER — SODIUM CHLORIDE 0.9 % IV SOLN
7.5000 mg/kg | Freq: Once | INTRAVENOUS | Status: AC
  Administered 2022-11-08: 800 mg via INTRAVENOUS
  Filled 2022-11-08: qty 32

## 2022-11-08 MED ORDER — POTASSIUM CHLORIDE IN NACL 20-0.9 MEQ/L-% IV SOLN
Freq: Once | INTRAVENOUS | Status: AC
  Filled 2022-11-08: qty 1000

## 2022-11-08 NOTE — Patient Instructions (Signed)
[facility]  Discharge Instructions: Thank you for choosing [facility] to provide your oncology and hematology care.   If you have a lab appointment with the Cancer Center, please go directly to the Cancer Center and check in at the registration area.   Wear comfortable clothing and clothing appropriate for easy access to any Portacath or PICC line.   We strive to give you quality time with your provider. You may need to reschedule your appointment if you arrive late (15 or more minutes).  Arriving late affects you and other patients whose appointments are after yours.  Also, if you miss three or more appointments without notifying the office, you may be dismissed from the clinic at the provider's discretion.      For prescription refill requests, have your pharmacy contact our office and allow 72 hours for refills to be completed.    Today you received the following chemotherapy and/or immunotherapy agents :  Bevacizumab, Atezolizumab, Gemcitabine, Cisplatin.       To help prevent nausea and vomiting after your treatment, we encourage you to take your nausea medication as directed.  BELOW ARE SYMPTOMS THAT SHOULD BE REPORTED IMMEDIATELY: *FEVER GREATER THAN 100.4 F (38 C) OR HIGHER *CHILLS OR SWEATING *NAUSEA AND VOMITING THAT IS NOT CONTROLLED WITH YOUR NAUSEA MEDICATION *UNUSUAL SHORTNESS OF BREATH *UNUSUAL BRUISING OR BLEEDING *URINARY PROBLEMS (pain or burning when urinating, or frequent urination) *BOWEL PROBLEMS (unusual diarrhea, constipation, pain near the anus) TENDERNESS IN MOUTH AND THROAT WITH OR WITHOUT PRESENCE OF ULCERS (sore throat, sores in mouth, or a toothache) UNUSUAL RASH, SWELLING OR PAIN  UNUSUAL VAGINAL DISCHARGE OR ITCHING   Items with * indicate a potential emergency and should be followed up as soon as possible or go to the Emergency Department if any problems should occur.  Please show the CHEMOTHERAPY  ALERT CARD or IMMUNOTHERAPY ALERT CARD at check-in to the Emergency Department and triage nurse.  Should you have questions after your visit or need to cancel or reschedule your appointment, please contact [facility]  Dept: [phone]  and follow the prompts.  Office hours are 8:00 a.m. to 4:30 p.m. Monday - Friday. Please note that voicemails left after 4:00 p.m. may not be returned until the following business day.  We are closed weekends and major holidays. You have access to a nurse at all times for urgent questions. Please call the main number to the clinic Dept: [phone] and follow the prompts.   For any non-urgent questions, you may also contact your provider using MyChart. We now offer e-Visits for anyone 58 and older to request care online for non-urgent symptoms. For details visit [email or web address].   Also download the MyChart app! Go to the app store, search "MyChart", open the app, select [place], and log in with your MyChart username and password.

## 2022-11-08 NOTE — Progress Notes (Signed)
Ok to tx with elevated AST and bili per Dr Mosetta Putt

## 2022-11-08 NOTE — Progress Notes (Signed)
Patient seen by Dr. America Brown are within treatment parameters.  Labs reviewed:  hg 9.8 , platelet 89  Per physician team, patient is ready for treatment and there are NO modifications to the treatment plan.

## 2022-11-08 NOTE — Progress Notes (Signed)
Ok per Dr Mosetta Putt to run post hydration along with cisplatin

## 2022-11-13 ENCOUNTER — Ambulatory Visit (HOSPITAL_COMMUNITY): Admission: RE | Admit: 2022-11-13 | Payer: 59 | Source: Ambulatory Visit

## 2022-11-15 MED FILL — Fosaprepitant Dimeglumine For IV Infusion 150 MG (Base Eq): INTRAVENOUS | Qty: 5 | Status: AC

## 2022-11-16 ENCOUNTER — Inpatient Hospital Stay: Payer: 59 | Admitting: Nutrition

## 2022-11-16 ENCOUNTER — Encounter: Payer: Self-pay | Admitting: Hematology

## 2022-11-16 ENCOUNTER — Inpatient Hospital Stay: Payer: 59 | Admitting: Hematology

## 2022-11-16 ENCOUNTER — Other Ambulatory Visit: Payer: Self-pay

## 2022-11-16 ENCOUNTER — Telehealth: Payer: Self-pay

## 2022-11-16 ENCOUNTER — Inpatient Hospital Stay: Payer: 59

## 2022-11-16 DIAGNOSIS — C221 Intrahepatic bile duct carcinoma: Secondary | ICD-10-CM

## 2022-11-16 DIAGNOSIS — Z5112 Encounter for antineoplastic immunotherapy: Secondary | ICD-10-CM | POA: Diagnosis not present

## 2022-11-16 DIAGNOSIS — Z95828 Presence of other vascular implants and grafts: Secondary | ICD-10-CM

## 2022-11-16 DIAGNOSIS — C220 Liver cell carcinoma: Secondary | ICD-10-CM

## 2022-11-16 LAB — CBC WITH DIFFERENTIAL (CANCER CENTER ONLY)
Abs Immature Granulocytes: 0.03 10*3/uL (ref 0.00–0.07)
Basophils Absolute: 0 10*3/uL (ref 0.0–0.1)
Basophils Relative: 0 %
Eosinophils Absolute: 0 10*3/uL (ref 0.0–0.5)
Eosinophils Relative: 1 %
HCT: 25.3 % — ABNORMAL LOW (ref 36.0–46.0)
Hemoglobin: 9.1 g/dL — ABNORMAL LOW (ref 12.0–15.0)
Immature Granulocytes: 1 %
Lymphocytes Relative: 53 %
Lymphs Abs: 2.4 10*3/uL (ref 0.7–4.0)
MCH: 37.1 pg — ABNORMAL HIGH (ref 26.0–34.0)
MCHC: 36 g/dL (ref 30.0–36.0)
MCV: 103.3 fL — ABNORMAL HIGH (ref 80.0–100.0)
Monocytes Absolute: 0.6 10*3/uL (ref 0.1–1.0)
Monocytes Relative: 13 %
Neutro Abs: 1.5 10*3/uL — ABNORMAL LOW (ref 1.7–7.7)
Neutrophils Relative %: 32 %
Platelet Count: 86 10*3/uL — ABNORMAL LOW (ref 150–400)
RBC: 2.45 MIL/uL — ABNORMAL LOW (ref 3.87–5.11)
RDW: 22 % — ABNORMAL HIGH (ref 11.5–15.5)
Smear Review: NORMAL
WBC Count: 4.6 10*3/uL (ref 4.0–10.5)
nRBC: 2.6 % — ABNORMAL HIGH (ref 0.0–0.2)

## 2022-11-16 LAB — CMP (CANCER CENTER ONLY)
ALT: 34 U/L (ref 0–44)
AST: 94 U/L — ABNORMAL HIGH (ref 15–41)
Albumin: 2.1 g/dL — ABNORMAL LOW (ref 3.5–5.0)
Alkaline Phosphatase: 146 U/L — ABNORMAL HIGH (ref 38–126)
Anion gap: 6 (ref 5–15)
BUN: 8 mg/dL (ref 8–23)
CO2: 28 mmol/L (ref 22–32)
Calcium: 9.2 mg/dL (ref 8.9–10.3)
Chloride: 104 mmol/L (ref 98–111)
Creatinine: 0.84 mg/dL (ref 0.44–1.00)
GFR, Estimated: >60 mL/min (ref >60)
Glucose, Bld: 80 mg/dL (ref 70–99)
Potassium: 3.4 mmol/L — ABNORMAL LOW (ref 3.5–5.1)
Sodium: 138 mmol/L (ref 135–145)
Total Bilirubin: 3.4 mg/dL — ABNORMAL HIGH (ref 0.3–1.2)
Total Protein: 6.3 g/dL — ABNORMAL LOW (ref 6.5–8.1)

## 2022-11-16 LAB — TOTAL PROTEIN, URINE DIPSTICK: Protein, ur: NEGATIVE mg/dL

## 2022-11-16 LAB — TSH: TSH: 6.777 u[IU]/mL — ABNORMAL HIGH (ref 0.350–4.500)

## 2022-11-16 LAB — MAGNESIUM: Magnesium: 1.3 mg/dL — ABNORMAL LOW (ref 1.7–2.4)

## 2022-11-16 MED ORDER — SODIUM CHLORIDE 0.9% FLUSH
10.0000 mL | Freq: Once | INTRAVENOUS | Status: AC
  Administered 2022-11-16: 10 mL

## 2022-11-16 MED ORDER — MEGESTROL ACETATE 625 MG/5ML PO SUSP: 625.0000 mg | Freq: Every day | ORAL | 0 refills | Status: DC

## 2022-11-16 NOTE — Progress Notes (Signed)
67 year old female diagnosed with mixed hepatocellular and bile duct cancer.  She is followed by Dr. Mosetta Putt.  Contacted patient by phone because her treatment was held today secondary to fatigue and anorexia. She is receiving Cisplatin+Gemcitabine D1,8+ Durvalumab (1500) D1 q21d/Durvaalumab (1500) q 28d   Past medical history includes chronic kidney disease stage III, alcoholic cirrhosis with ascites, hep C, and hypertension.  Medications include Lasix, lactulose, Creon, magnesium oxide, Zofran, Protonix, MiraLAX, and Compazine. Megace was ordered today.  Labs include magnesium 1.3, potassium 3.4, albumin 2.1 on July 18.  Height: 5 feet 5 inches. Weight: 226 pounds 3 ounces on July 18. Usual body weight: 228 pounds December 2023. BMI: 37.64. ECOG: 2.  Treatment held today secondary to anorexia and fatigue.  Noted patient has been unable to do activities around the house for about 1 month. Patient has not had weight loss according to the scale however she has a history of ascites which can skew accurate body weight.  She reports occasional nausea which does improve when she takes antiemetics.  She denies constipation.  Reports she has been able to eat today and consumed grits, a fruit cup, and an original boost providing 240 cal and 20 g protein per husband.  Nutrition diagnosis: Inadequate oral intake related to cancer and associated treatments as evidenced by patient's self-report of poor oral intake.  Intervention: Patient educated to consume small amounts of food frequently throughout the day. Encouraged her to drink boost plus or equivalent to provide additional calories.  Suggested 3 times daily between meals. Continue bowel management. Take Megace as prescribed. Reviewed sources of high-protein foods. Clarified information with husband with patient permission.  Monitoring, evaluation, goals: Patient will tolerate increased calories and protein to minimize loss of lean body mass and  improve quality of life/energy levels.  Next visit: Wednesday, August 7 during infusion with Rosalita Chessman.  **Disclaimer: This note was dictated with voice recognition software. Similar sounding words can inadvertently be transcribed and this note may contain transcription errors which may not have been corrected upon publication of note.**

## 2022-11-16 NOTE — Progress Notes (Signed)
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  -Although she had typical image findings of HCC, she underwent liver biopsy since CA 19-9 was also elevated  -liver biopsy showed well to moderately differentiated CK7 positive adenocarcinoma and her EGD was negative for upper GI tract malignancy. Tumor cells were also positive for Glycipan 3, supporting mixed HCC and cholagiocarcinoma. -she started GEMOX (cisplatin is contraindicated due to her chronic kidney disease) and continued durvalumab on 05/10/22, she tolerated treatment well overall  -restaging MRI from 08/01/22 showed disease progression in liver.  -her tumor marker AFP has significantly increased lately, but her CA 19.9 is trending down.  -I have changed her treatment to cisplatin,  gemcitabine, atezolizumab and bevacizumab, started on 08/30/2022.  -She tolerated first cycle chemotherapy well, except significant leg edema.  We gave her IV Lasix during the infusion, and she took oral Lasix for 5 days after treatment.  Her leg edema has improved -she has been more fatigued lately, with worsening PS  -she is scheduled for restaging MRI on 7/15, but she forgot, or reschedule for her. -Due to her worsening anorexia and fatigue, I will hold chemo treatment today.  If her MRI is stable, we will change her treatment to every 2 weeks   Hypercalcemia -resolved since she started chemo      CKD (chronic kidney disease) stage 3, GFR 30-59 ml/min (HCC) -will monitor renal function closely during chemo    -improved lately  -Will monitor closely, especially when she is on furosemide.     Alcoholic cirrhosis of liver with ascites (HCC) -f/u with GI, will watch her LFTs closely on chemo     Anorexia, fatigue, constipation -Likely secondary to her underlying malignancy, liver cirrhosis and chemotherapy -We reviewed constipation management, I encouraged her to try MiraLAX or Senokot, and to use lactulose more frequently to have bowel movements at least once a day -We also discussed nutrition supplement, encouraged her to take more liquid nutrition.  -Follow-up with dietitian -I will also call you Megace for appetite    PLAN: -lab reviewed -Follow-up with dietitian, she has appointment with them this afternoon -Tumor marker -pending -Due to her anorexia and her worsening fatigue, will hold chemotherapy today  give pt chemo break and resume 7/30 -no treatment today due to fatigue and no appetite. 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  Underlying hepatic cirrhosis suspected.   2.5 cm indeterminate central hypoechoic hepatic lesion. See above comment.   02/22/2021 Imaging   CLINICAL DATA:  Right-sided abdominal pain.   EXAM: ABDOMEN ULTRASOUND COMPLETE  IMPRESSION: 1. Cirrhosis and small ascites. 2. Gallbladder sludge. No sonographic evidence of acute cholecystitis.   02/22/2021 Imaging   CLINICAL DATA:  Abdominal pain.  Concern for diverticulitis.   EXAM: CT ABDOMEN AND PELVIS WITH CONTRAST  IMPRESSION: 1. Acute pancreatitis. No abscess or pseudocyst. 2. Cirrhosis with small ascites. 3. Colonic diverticulosis. No bowel obstruction. Normal appendix. 4. Aortic Atherosclerosis (ICD10-I70.0).   03/02/2021 Tumor Marker   CA 19-9 = 45 (^) AFP = 11 (^)   03/03/2021 Imaging   CLINICAL DATA:  Pancreatitis, worsening abdominal pain.   EXAM: CT ABDOMEN AND PELVIS WITH CONTRAST  IMPRESSION: 1. Multiple suspicious lesions in the right hepatic lobe. Patient has underlying cirrhosis and findings are concerning for multifocal hepatocellular carcinoma. Metastatic disease is also in the differential diagnosis. Recommend MRI of the abdomen with and without contrast. If the patient cannot tolerate a MRI, consider a multiphase liver CT. 2. Increased inflammatory changes in the upper abdomen compatible with pancreatitis and a large developing pseudocyst. This is developing pseudocyst measures up to 14 cm in size. 3. Cirrhosis with evidence of portal hypertension demonstrated by esophageal varices and varices draining into the IVC. 4. Indeterminate 3 mm pulmonary nodule in the lingula. Management of  this pulmonary nodule depends on the etiology of the liver lesions.   03/04/2021 Imaging   EXAM: MRI ABDOMEN WITHOUT AND WITH CONTRAST (INCLUDING MRCP)  IMPRESSION: 1. Multiple enhancing lesions are noted throughout the right lobe of the liver, as detailed above. These have indeterminate imaging characteristics. Given the background of hepatic cirrhosis, the possibility of multifocal infiltrative hepatocellular carcinoma should be considered, although none of these lesions meet definite imaging criteria to establish that diagnosis. Correlation with AFP level is recommended. Other differential considerations include malignant and benign etiologies such as fibrolamellar hepatocellular carcinoma and multifocal focal nodular hyperplasia (FNH). Consideration for repeat abdominal MRI with and without IV gadolinium (Eovist) is also suggested, although biopsy may ultimately be required to establish a tissue diagnosis. 2. 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 3. Trace volume of perihepatic ascites.   09/22/2021 Tumor Marker   CA 19-9 = 190 (^) AFP = 124 (^)   01/19/2022 Tumor Marker   CA 19-9 = 816 (^) AFP = 307 (^)   02/06/2022 Imaging   EXAM: MRI ABDOMEN WITHOUT AND WITH CONTRAST (INCLUDING MRCP)  IMPRESSION: 1. Morphologic features of the liver compatible with cirrhosis. 2. Extensive enhancing liver lesions are identified throughout both lobes of liver which demonstrate marked progression compared with the previous exam. Enhancing lesions demonstrate washout on the delayed images. In the setting of cirrhosis with progressively elevated alpha fetoprotein levels imaging findings are highly worrisome for multifocal hepatocellular carcinoma. 3. Enlarged upper abdominal lymph nodes identified.  Cannot exclude underlying nodal metastasis. 4. There are several lesions within the central mesentery in the distribution of previous  large pseudo cysts. The largest measures 5.0 x 3.1 cm. This is favored to represent sequelae of prior hemorrhagic pancreatitis with pseudocyst formation.   02/23/2022 Initial Diagnosis   Hepatocellular carcinoma (HCC)   03/20/2022 Cancer Staging   Staging form: Liver, AJCC 8th Edition - Clinical stage from 03/20/2022: Stage IVA (cT3, cN1, cM0) - Signed by Malachy Mood, MD on 03/20/2022   03/21/2022 - 04/20/2022 Chemotherapy   Patient is on Treatment Plan : LUNG NSCLC Durvalumab (1500) q28d     04/04/2022 Pathology Results   A. LIVER, RIGHT LOBE, BIOPSY: Well to moderately differentiated CK7 positive adenocarcinoma (see comment)  COMMENT:  Given the cytohistomorphology and this immunohistochemical pattern of cytokeratin 7 positivity, the differential diagnosis would include a pancreaticobiliary or upper gastrointestinal intestinal tract primary or possibly a lung primary (not favored).    05/11/2022 - 05/11/2022 Chemotherapy   Patient is on Treatment Plan : BLADDER Durvalumab (10) q14d     Cholangiocarcinoma of liver (HCC)  04/23/2022 Initial Diagnosis   Cholangiocarcinoma of liver (HCC)   05/11/2022 - 05/11/2022 Chemotherapy   Patient is on Treatment Plan : BILIARY TRACT Cisplatin + Gemcitabine D1,8 q21d     05/11/2022 - 07/27/2022 Chemotherapy   Patient is on Treatment Plan : PANCREAS GemOx q14d     08/31/2022 -  Chemotherapy   Patient is on Treatment Plan : BILIARY TRACT Cisplatin + Gemcitabine D1,8 + Durvalumab (1500) D1 q21d / Durvalumab (1500) q28d        INTERVAL HISTORY:  Connie [name] is here for a follow up of  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 She was last seen by me on 11/08/2022. She presents to the clinic accompanied by husband. Pt state that after last treatment she was fatigue. Day 2 she is recovering. Pt state that she is eating very little.Pt state that she has some nutrient supplement. Pt is unable to do activities around the house for about a  month.    All other systems were reviewed with the patient and are negative.  MEDICAL HISTORY:  Past Medical History:  Diagnosis Date   Alcohol abuse    Alcoholic cirrhosis of liver (HCC)    Atrial fibrillation with RVR (HCC) 08/02/2019   Cataract    Chronic kidney disease    Helicobacter pylori gastritis 08/07/2019   Hepatitis C antibody positive in blood - negative RNA    Hypertension    liver ca 01/2022   bile duct ca 05/2022   Pancreatic pseudocyst 03/04/2021    SURGICAL HISTORY: Past Surgical History:  Procedure Laterality Date   BIOPSY  08/03/2019   Procedure: BIOPSY;  Surgeon: Iva Boop, MD;  Location: [facility] ENDOSCOPY;  Service: Endoscopy;;   ESOPHAGOGASTRODUODENOSCOPY (EGD) WITH PROPOFOL N/A 08/03/2019   Procedure: ESOPHAGOGASTRODUODENOSCOPY (EGD) WITH PROPOFOL;  Surgeon: Iva Boop, MD;  Location[facility] ENDOSCOPY;  Service: Endoscopy;  Laterality: N/A;   IR IMAGING GUIDED PORT INSERTION  05/29/2022   IR RADIOLOGIST EVAL & MGMT  02/14/2022   IR RADIOLOGIST EVAL & MGMT  03/14/2022    I have reviewed the social history and family history with the patient and they are unchanged from previous note.  ALLERGIES:  has No Known Allergies.  MEDICATIONS:  Current Outpatient Medications  Medication Sig Dispense Refill   acetaminophen (TYLENOL) 500 MG tablet Take 1,000 mg by mouth every 6 (six) hours as needed for mild  pain.     amLODipine (NORVASC) 5 MG tablet Take 1 tablet by mouth daily.     diclofenac Sodium (VOLTAREN) 1 % GEL Apply 2 g topically 2 (two) times daily as needed (pain).     furosemide (LASIX) 20 MG tablet Take 1 tablet (20 mg total) by mouth daily as needed as directed for leg swelling for no more than 5 days. 20 tablet 0   gabapentin (NEURONTIN) 100 MG capsule TAKE 1 CAPSULE BY MOUTH 3 TIMES  DAILY 270 capsule 3   lactulose (CHRONULAC) 10 GM/15ML solution Take 30 mLs (20 g total) by mouth 2 (two) times daily as needed for mild constipation (titrate dose to  have bowel movement 2-3 times a day). 473 mL 1   latanoprost (XALATAN) 0.005 % ophthalmic solution Place 1 drop into both eyes at bedtime.     lidocaine-prilocaine (EMLA) cream Apply 1 Application topically as needed. Apply 5ml to Port-a-Cath site 45 mins to 1 hr before port-a-cath access. 30 g 0   lipase/protease/amylase (CREON) 12000-38000 units CPEP capsule Take 1 capsule (12,000 Units total) by mouth 3 (three) times daily with meals. 270 capsule 2   magnesium oxide (MAG-OX) 400 (240 Mg) MG tablet Take 1 tablet (400 mg total) by mouth daily. 30 tablet 0   ondansetron (ZOFRAN) 8 MG tablet Take 1 tablet (8 mg total) by mouth every 8 (eight) hours as needed for nausea or vomiting. 60 tablet 1   oxyCODONE (OXY IR/ROXICODONE) 5 MG immediate release tablet Take 1 tablet (5 mg total) by mouth every 6 (six) hours as needed for severe pain. 30 tablet 0   pantoprazole (PROTONIX) 40 MG tablet TAKE 1 TABLET BY MOUTH DAILY 100 tablet 2   polyethylene glycol (MIRALAX / GLYCOLAX) 17 g packet Take 17 g by mouth 2 (two) times daily. 14 each 0   potassium chloride SA (KLOR-CON M) 20 MEQ tablet Take 1 tablet (20 mEq total) by mouth 2 (two) times daily.  Take only when taking furosemide (lasix) as directed. 20 tablet 0   prochlorperazine (COMPAZINE) 10 MG tablet Take 1 tablet (10 mg total) by mouth every 6 (six) hours as needed for nausea or vomiting. 30 tablet 2   No current facility-administered medications for this visit.    PHYSICAL EXAMINATION: ECOG PERFORMANCE STATUS: 2 - Symptomatic, <50% confined to bed  Vitals:   11/16/22 0759  BP: 137/68  Pulse: 77  Resp: 18  Temp: 97.6 F (36.4 C)  SpO2: 98%   Wt Readings from Last 3 Encounters:  11/16/22 226 lb 3.2 oz (102.6 kg)  11/08/22 223 lb 3.2 oz (101.2 kg)  10/26/22 224 lb 6.4 oz (101.8 kg)     GENERAL:alert, no distress and comfortable SKIN: skin color normal, no rashes or significant lesions EYES: normal, Conjunctiva are pink and non-injected,  sclera clear  NEURO: alert & oriented x 3 with fluent speech  LABORATORY DATA:  I have reviewed the data as listed    Latest Ref Rng & Units 11/16/2022    7:26 AM 11/08/2022    8:11 AM 10/26/2022    7:31 AM  CBC  WBC 4.0 - 10.5 K/uL 4.6  4.8  3.9   Hemoglobin 12.0 - 15.0 g/dL 9.1  9.8  9.5   Hematocrit 36.0 - 46.0 % 25.3  27.9  27.1   Platelets 150 - 400 K/uL 86  89  90         Latest Ref Rng & Units 11/16/2022    7:26  AM 11/08/2022    8:11 AM 10/26/2022    7:31 AM  CMP  Glucose 70 - 99 mg/dL 80  308  93   BUN 8 - 23 mg/dL 8  6  7    Creatinine 0.44 - 1.00 mg/dL 6.57  8.46  9.62   Sodium 135 - 145 mmol/L 138  139  140   Potassium 3.5 - 5.1 mmol/L 3.4  3.5  3.5   Chloride 98 - 111 mmol/L 104  107  107   CO2 22 - 32 mmol/L 28  28  30    Calcium 8.9 - 10.3 mg/dL 9.2  8.8  9.7   Total Protein 6.5 - 8.1 g/dL 6.3  6.2  6.1   Total Bilirubin 0.3 - 1.2 mg/dL 3.4  1.6  1.3   Alkaline Phos 38 - 126 U/L 146  147  140   AST 15 - 41 U/L 94  114  83   ALT 0 - 44 U/L 34  46  32       RADIOGRAPHIC STUDIES: I have personally reviewed the radiological images as listed and agreed with the findings in the report. No results found.    No orders of the defined types were placed in this encounter.  All questions were answered. The patient knows to call the clinic with any problems, questions or concerns. No barriers to learning was detected. The total time spent in the appointment was 30 minutes.     Malachy Mood, MD 11/16/2022   Carolin Coy, CMA, am acting as scribe for Malachy Mood, MD.   I have reviewed the above documentation for accuracy and completeness, and I agree with the above.

## 2022-11-16 NOTE — Telephone Encounter (Signed)
Spoke with pt's husband regarding the prescription Dr. Mosetta Putt sent in for Megesterol (Megace).  Husband was "OK" with prescription being sent to [facility] but prefers if pt's prescriptions are sent to [facility] d/t they do not have transportation.

## 2022-11-17 LAB — CANCER ANTIGEN 19-9: CA 19-9: 303 U/mL — ABNORMAL HIGH (ref 0–35)

## 2022-11-17 LAB — AFP TUMOR MARKER: AFP, Serum, Tumor Marker: 727 ng/mL — ABNORMAL HIGH (ref 0.0–9.2)

## 2022-11-19 ENCOUNTER — Other Ambulatory Visit: Payer: Self-pay | Admitting: Hematology

## 2022-11-19 MED ORDER — MAGNESIUM OXIDE -MG SUPPLEMENT 400 (240 MG) MG PO TABS: 400.0000 mg | ORAL_TABLET | Freq: Two times a day (BID) | ORAL | 1 refills | Status: DC

## 2022-11-25 ENCOUNTER — Other Ambulatory Visit: Payer: Self-pay | Admitting: Nurse Practitioner

## 2022-11-25 ENCOUNTER — Ambulatory Visit (HOSPITAL_COMMUNITY)
Admission: RE | Admit: 2022-11-25 | Discharge: 2022-11-25 | Disposition: A | Discharge status: Home / Self Care | Payer: 59 | Source: Ambulatory Visit | Attending: Nurse Practitioner | Admitting: Nurse Practitioner

## 2022-11-25 DIAGNOSIS — C221 Intrahepatic bile duct carcinoma: Secondary | ICD-10-CM

## 2022-11-25 MED ORDER — GADOBUTROL 1 MMOL/ML IV SOLN
10.0000 mL | Freq: Once | INTRAVENOUS | Status: AC | PRN
  Administered 2022-11-25: 10 mL via INTRAVENOUS

## 2022-11-26 NOTE — Progress Notes (Unsigned)
Patient Care Team: Loura Back, NP as PCP - General (Nurse Practitioner) Thurmon Fair, MD as PCP - Cardiology (Cardiology) Annamarie Major, CRNP as Nurse Practitioner (Transplant Hepatology) Simonne Come, MD as Consulting Physician (Interventional Radiology) Malachy Mood, MD as Consulting Physician (Hematology and Oncology)   CHIEF COMPLAINT: Follow up mixed [facility] and bile duct cancer   Oncology History Overview Note   Cancer Staging  Mixed hepatocellular and bile duct carcinoma [facility]) Staging form: Liver, AJCC 8th Edition - Clinical stage from 03/20/2022: Stage IVA (cT3, cN1, cM0) - Signed by Malachy Mood, MD on 03/20/2022     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  Underlying hepatic cirrhosis suspected.   2.5 cm indeterminate central hypoechoic hepatic lesion. See above comment.   02/22/2021 Imaging   CLINICAL DATA:  Right-sided abdominal pain.   EXAM: ABDOMEN ULTRASOUND COMPLETE  IMPRESSION: 1. Cirrhosis and small ascites. 2. Gallbladder sludge. No sonographic evidence of acute cholecystitis.   02/22/2021 Imaging   CLINICAL DATA:  Abdominal pain.  Concern for diverticulitis.   EXAM: CT ABDOMEN AND PELVIS WITH CONTRAST  IMPRESSION: 1. Acute pancreatitis. No abscess or pseudocyst. 2. Cirrhosis with small ascites. 3. Colonic diverticulosis. No bowel obstruction. Normal appendix. 4. Aortic Atherosclerosis (ICD10-I70.0).   03/02/2021 Tumor Marker   CA 19-9 = 45 (^) AFP = 11 (^)   03/03/2021 Imaging   CLINICAL DATA:  Pancreatitis, worsening abdominal pain.   EXAM: CT ABDOMEN AND PELVIS WITH CONTRAST  IMPRESSION: 1. Multiple suspicious lesions in the right hepatic lobe. Patient has underlying cirrhosis and findings are concerning for  multifocal hepatocellular carcinoma. Metastatic disease is also in the differential diagnosis. Recommend MRI of the abdomen with and without contrast. If the patient cannot tolerate a MRI, consider a multiphase liver CT. 2. Increased inflammatory changes in the upper abdomen compatible with pancreatitis and a large developing pseudocyst. This is developing pseudocyst measures up to 14 cm in size. 3. Cirrhosis with evidence of portal hypertension demonstrated by esophageal varices and varices draining into the IVC. 4. Indeterminate 3 mm pulmonary nodule in the lingula. Management of this pulmonary nodule depends on the etiology of the liver lesions.   03/04/2021 Imaging   EXAM: MRI ABDOMEN WITHOUT AND WITH CONTRAST (INCLUDING MRCP)  IMPRESSION: 1. Multiple enhancing lesions are noted throughout the right lobe of the liver, as detailed above. These have indeterminate imaging characteristics. Given the background of hepatic cirrhosis, the possibility of multifocal infiltrative hepatocellular carcinoma should be considered, although none of these lesions meet definite imaging criteria to establish that diagnosis. Correlation with AFP level is recommended. Other differential considerations include malignant and benign etiologies such as fibrolamellar hepatocellular carcinoma and multifocal focal nodular hyperplasia (FNH). Consideration for repeat abdominal MRI with and without IV gadolinium (Eovist) is also suggested, although biopsy may ultimately be required to establish a tissue diagnosis. 2. 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 3. Trace volume of perihepatic ascites.   09/22/2021 Tumor Marker   CA 19-9 = 190 (^) AFP = 124 (^)   01/19/2022 Tumor Marker   CA 19-9 = 816 (^) AFP = 307 (^)   02/06/2022 Imaging   EXAM: MRI ABDOMEN WITHOUT AND WITH CONTRAST (INCLUDING MRCP)  IMPRESSION: 1. Morphologic features of the liver  compatible with  cirrhosis. 2. Extensive enhancing liver lesions are identified throughout both lobes of liver which demonstrate marked progression compared with the previous exam. Enhancing lesions demonstrate washout on the delayed images. In the setting of cirrhosis with progressively elevated alpha fetoprotein levels imaging findings are highly worrisome for multifocal hepatocellular carcinoma. 3. Enlarged upper abdominal lymph nodes identified. Cannot exclude underlying nodal metastasis. 4. There are several lesions within the central mesentery in the distribution of previous large pseudo cysts. The largest measures 5.0 x 3.1 cm. This is favored to represent sequelae of prior hemorrhagic pancreatitis with pseudocyst formation.   02/23/2022 Initial Diagnosis   Hepatocellular carcinoma (HCC)   03/20/2022 Cancer Staging   Staging form: Liver, AJCC 8th Edition - Clinical stage from 03/20/2022: Stage IVA (cT3, cN1, cM0) - Signed by Malachy Mood, MD on 03/20/2022   03/21/2022 - 04/20/2022 Chemotherapy   Patient is on Treatment Plan : LUNG NSCLC Durvalumab (1500) q28d     04/04/2022 Pathology Results   A. LIVER, RIGHT LOBE, BIOPSY: Well to moderately differentiated CK7 positive adenocarcinoma (see comment)  COMMENT:  Given the cytohistomorphology and this immunohistochemical pattern of cytokeratin 7 positivity, the differential diagnosis would include a pancreaticobiliary or upper gastrointestinal intestinal tract primary or possibly a lung primary (not favored).    05/11/2022 - 05/11/2022 Chemotherapy   Patient is on Treatment Plan : BLADDER Durvalumab (10) q14d     Cholangiocarcinoma of liver (HCC)  04/23/2022 Initial Diagnosis   Cholangiocarcinoma of liver (HCC)   05/11/2022 - 05/11/2022 Chemotherapy   Patient is on Treatment Plan : BILIARY TRACT Cisplatin + Gemcitabine D1,8 q21d     05/11/2022 - 07/27/2022 Chemotherapy   Patient is on Treatment Plan : PANCREAS GemOx q14d     08/31/2022  -  Chemotherapy   Patient is on Treatment Plan : BILIARY TRACT Cisplatin + Gemcitabine D1,8 + Durvalumab (1500) D1 q21d / Durvalumab (1500) q28d        CURRENT THERAPY: Cisplatin+Gemcitabine D1,8+ Durvalumab (1500) D1 q21d/Durvaalumab (1500) q 28d   INTERVAL HISTORY Connie [name] returns for follow up and treatment as scheduled. Last seen by Dr. Mosetta Putt 11/16/22 and given a treatment breast due to anorexia and fatigue.   ROS   Past Medical History:  Diagnosis Date   Alcohol abuse    Alcoholic cirrhosis of liver (HCC)    Atrial fibrillation with RVR (HCC) 08/02/2019   Cataract    Chronic kidney disease    Helicobacter pylori gastritis 08/07/2019   Hepatitis C antibody positive in blood - negative RNA    Hypertension    liver ca 01/2022   bile duct ca 05/2022   Pancreatic pseudocyst 03/04/2021     Past Surgical History:  Procedure Laterality Date   BIOPSY  08/03/2019   Procedure: BIOPSY;  Surgeon: Iva Boop, MD;  Location: [facility] ENDOSCOPY;  Service: Endoscopy;;   ESOPHAGOGASTRODUODENOSCOPY (EGD) WITH PROPOFOL N/A 08/03/2019   Procedure: ESOPHAGOGASTRODUODENOSCOPY (EGD) WITH PROPOFOL;  Surgeon: Iva Boop, MD;  Location: [facility] ENDOSCOPY;  Service: Endoscopy;  Laterality: N/A;   IR IMAGING GUIDED PORT INSERTION  05/29/2022   IR RADIOLOGIST EVAL & MGMT  02/14/2022   IR RADIOLOGIST EVAL & MGMT  03/14/2022     Outpatient Encounter Medications as of 11/29/2022  Medication Sig   acetaminophen (TYLENOL) 500 MG tablet Take 1,000 mg by mouth every 6 (six) hours as needed for mild pain.   amLODipine (NORVASC) 5 MG tablet Take 1 tablet by mouth daily.   diclofenac Sodium (VOLTAREN) 1 % GEL Apply  2 g topically 2 (two) times daily as needed (pain).   furosemide (LASIX) 20 MG tablet Take 1 tablet (20 mg total) by mouth daily as needed as directed for leg swelling for no more than 5 days.   gabapentin (NEURONTIN) 100 MG capsule TAKE 1 CAPSULE BY MOUTH 3 TIMES  DAILY   lactulose (CHRONULAC) 10  GM/15ML solution Take 30 mLs (20 g total) by mouth 2 (two) times daily as needed for mild constipation (titrate dose to have bowel movement 2-3 times a day).   latanoprost (XALATAN) 0.005 % ophthalmic solution Place 1 drop into both eyes at bedtime.   lidocaine-prilocaine (EMLA) cream Apply 1 Application topically as needed. Apply 5ml to Port-a-Cath site 45 mins to 1 hr before port-a-cath access.   lipase/protease/amylase (CREON) 12000-38000 units CPEP capsule Take 1 capsule (12,000 Units total) by mouth 3 (three) times daily with meals.   magnesium oxide (MAG-OX) 400 (240 Mg) MG tablet Take 1 tablet (400 mg total) by mouth 2 (two) times daily.   megestrol (MEGACE ES) 625 MG/5ML suspension Take 5 mLs (625 mg total) by mouth daily.   ondansetron (ZOFRAN) 8 MG tablet Take 1 tablet (8 mg total) by mouth every 8 (eight) hours as needed for nausea or vomiting.   oxyCODONE (OXY IR/ROXICODONE) 5 MG immediate release tablet Take 1 tablet (5 mg total) by mouth every 6 (six) hours as needed for severe pain.   pantoprazole (PROTONIX) 40 MG tablet TAKE 1 TABLET BY MOUTH DAILY   polyethylene glycol (MIRALAX / GLYCOLAX) 17 g packet Take 17 g by mouth 2 (two) times daily.   potassium chloride SA (KLOR-CON M) 20 MEQ tablet Take 1 tablet (20 mEq total) by mouth 2 (two) times daily.  Take only when taking furosemide (lasix) as directed.   prochlorperazine (COMPAZINE) 10 MG tablet Take 1 tablet (10 mg total) by mouth every 6 (six) hours as needed for nausea or vomiting.   No facility-administered encounter medications on file as of 11/29/2022.     There were no vitals filed for this visit. There is no height or weight on file to calculate BMI.   PHYSICAL EXAM GENERAL:alert, no distress and comfortable SKIN: no rash  EYES: sclera clear NECK: without mass LYMPH:  no palpable cervical or supraclavicular lymphadenopathy  LUNGS: clear with normal breathing effort HEART: regular rate & rhythm, no lower extremity  edema ABDOMEN: abdomen soft, non-tender and normal bowel sounds NEURO: alert & oriented x 3 with fluent speech, no focal motor/sensory deficits Breast exam:  PAC without erythema    CBC    Component Value Date/Time   WBC 4.6 11/16/2022 0726   WBC 8.2 06/28/2022 1133   RBC 2.45 (L) 11/16/2022 0726   HGB 9.1 (L) 11/16/2022 0726   HCT 25.3 (L) 11/16/2022 0726   PLT 86 (L) 11/16/2022 0726   MCV 103.3 (H) 11/16/2022 0726   MCH 37.1 (H) 11/16/2022 0726   MCHC 36.0 11/16/2022 0726   RDW 22.0 (H) 11/16/2022 0726   LYMPHSABS 2.4 11/16/2022 0726   MONOABS 0.6 11/16/2022 0726   EOSABS 0.0 11/16/2022 0726   BASOSABS 0.0 11/16/2022 0726     CMP     Component Value Date/Time   NA 138 11/16/2022 0726   K 3.4 (L) 11/16/2022 0726   CL 104 11/16/2022 0726   CO2 28 11/16/2022 0726   GLUCOSE 80 11/16/2022 0726   BUN 8 11/16/2022 0726   CREATININE 0.84 11/16/2022 0726   CALCIUM 9.2 11/16/2022 0726  CALCIUM 11.1 (H) 03/30/2022 1051   PROT 6.3 (L) 11/16/2022 0726   ALBUMIN 2.1 (L) 11/16/2022 0726   AST 94 (H) 11/16/2022 0726   ALT 34 11/16/2022 0726   ALKPHOS 146 (H) 11/16/2022 0726   BILITOT 3.4 (H) 11/16/2022 0726   GFRNONAA >60 11/16/2022 0726   GFRAA 45 (L) 08/04/2019 0337     ASSESSMENT & PLAN:Connie [name] is a 67 y.o. female with    Mixed HCC and cholangiocarcinoma, unresectable, with liver metastasis, stage IV  -Liver cirrhosis and a 2.5cm mass was found in 07/2019 but she did not follow up -due to rising AFP, she had MRI on 02/06/2022 which showed extensive liver lesions (largest 13cm) throughout both lobes with marked progression from prior MRI; enlarged upper abdominal lymph nodes, no distant mets -she started Durvaluamb every 4 weeks, and Tremelimumab 300mg  once on day 1 of cycle 1, on 03/21/2022.  -Although she had typical image findings of HCC, she underwent liver biopsy since CA 19-9 was also elevated; biopsy showed well to moderately differentiated CK7 positive  adenocarcinoma; the IHC staining profile includes pancreaticobiliary or upper GI primary, and possibly lung but not favored. EGD was negative -She has mixed HCC and cholangiocarcinoma features.  -Began GEMOX q2 weeks on 05/10/22 and continues durvalumab q4 weeks  -CT 06/28/22 showed stable disease; she completed a total of 6 cycles -Her AFP tumor marker significantly increased, but CA 19-9 was trending down.  Restaging MRI 08/01/2022 showed disease progression in the liver -She began second line cisplatin/gemcitabin on days 1, 8 and atezo/bev on day 1 q21 days on 08/30/22.  -AFP and CA 19-9 trending down on this regimen, c/w a clinical response to treatment. She is pleased.   Alcoholic cirrhosis of liver with ascites (HCC) -f/u with liver clinic NP Dawn Drazak  -she also had HCV infection    Hypercalcemia -PTH level low, so not primary hyperparathyroidism  -rpPTH normal -she received pamidronate x2, Ca normalized    PLAN:  No orders of the defined types were placed in this encounter.     All questions were answered. The patient knows to call the clinic with any problems, questions or concerns. No barriers to learning were detected. I spent *** counseling the patient face to face. The total time spent in the appointment was *** and more than 50% was on counseling, review of test results, and coordination of care.   Santiago Glad, NP-C @DATE @

## 2022-11-28 MED FILL — Fosaprepitant Dimeglumine For IV Infusion 150 MG (Base Eq): INTRAVENOUS | Qty: 5 | Status: AC

## 2022-11-29 ENCOUNTER — Inpatient Hospital Stay (HOSPITAL_BASED_OUTPATIENT_CLINIC_OR_DEPARTMENT_OTHER): Payer: 59 | Admitting: Nurse Practitioner

## 2022-11-29 ENCOUNTER — Other Ambulatory Visit (HOSPITAL_COMMUNITY): Payer: Self-pay

## 2022-11-29 ENCOUNTER — Other Ambulatory Visit: Payer: Self-pay

## 2022-11-29 ENCOUNTER — Encounter: Payer: Self-pay | Admitting: Hematology

## 2022-11-29 ENCOUNTER — Inpatient Hospital Stay: Payer: 59

## 2022-11-29 ENCOUNTER — Encounter: Payer: Self-pay | Admitting: Nurse Practitioner

## 2022-11-29 ENCOUNTER — Encounter: Payer: 59 | Admitting: Nutrition

## 2022-11-29 ENCOUNTER — Telehealth: Payer: Self-pay

## 2022-11-29 VITALS — BP 144/80 | HR 78 | Temp 97.6°F | Resp 17 | Wt 229.5 lb

## 2022-11-29 DIAGNOSIS — Z5112 Encounter for antineoplastic immunotherapy: Secondary | ICD-10-CM | POA: Diagnosis not present

## 2022-11-29 DIAGNOSIS — C221 Intrahepatic bile duct carcinoma: Secondary | ICD-10-CM

## 2022-11-29 DIAGNOSIS — C220 Liver cell carcinoma: Secondary | ICD-10-CM

## 2022-11-29 DIAGNOSIS — Z95828 Presence of other vascular implants and grafts: Secondary | ICD-10-CM

## 2022-11-29 LAB — CMP (CANCER CENTER ONLY)
ALT: 58 U/L — ABNORMAL HIGH (ref 0–44)
AST: 145 U/L — ABNORMAL HIGH (ref 15–41)
Albumin: 2 g/dL — ABNORMAL LOW (ref 3.5–5.0)
Alkaline Phosphatase: 156 U/L — ABNORMAL HIGH (ref 38–126)
Anion gap: 3 — ABNORMAL LOW (ref 5–15)
BUN: 5 mg/dL — ABNORMAL LOW (ref 8–23)
CO2: 27 mmol/L (ref 22–32)
Calcium: 8.8 mg/dL — ABNORMAL LOW (ref 8.9–10.3)
Chloride: 108 mmol/L (ref 98–111)
Creatinine: 0.93 mg/dL (ref 0.44–1.00)
GFR, Estimated: >60 mL/min (ref >60)
Glucose, Bld: 117 mg/dL — ABNORMAL HIGH (ref 70–99)
Potassium: 3.8 mmol/L (ref 3.5–5.1)
Sodium: 138 mmol/L (ref 135–145)
Total Bilirubin: 3.4 mg/dL — ABNORMAL HIGH (ref 0.3–1.2)
Total Protein: 6.1 g/dL — ABNORMAL LOW (ref 6.5–8.1)

## 2022-11-29 LAB — CBC WITH DIFFERENTIAL (CANCER CENTER ONLY)
Abs Immature Granulocytes: 0.03 10*3/uL (ref 0.00–0.07)
Basophils Absolute: 0 10*3/uL (ref 0.0–0.1)
Basophils Relative: 0 %
Eosinophils Absolute: 0.3 10*3/uL (ref 0.0–0.5)
Eosinophils Relative: 4 %
HCT: 28.1 % — ABNORMAL LOW (ref 36.0–46.0)
Hemoglobin: 9.8 g/dL — ABNORMAL LOW (ref 12.0–15.0)
Immature Granulocytes: 1 %
Lymphocytes Relative: 37 %
Lymphs Abs: 2.1 10*3/uL (ref 0.7–4.0)
MCH: 38.4 pg — ABNORMAL HIGH (ref 26.0–34.0)
MCHC: 34.9 g/dL (ref 30.0–36.0)
MCV: 110.2 fL — ABNORMAL HIGH (ref 80.0–100.0)
Monocytes Absolute: 1.5 10*3/uL — ABNORMAL HIGH (ref 0.1–1.0)
Monocytes Relative: 25 %
Neutro Abs: 1.9 10*3/uL (ref 1.7–7.7)
Neutrophils Relative %: 33 %
Platelet Count: 146 10*3/uL — ABNORMAL LOW (ref 150–400)
RBC: 2.55 MIL/uL — ABNORMAL LOW (ref 3.87–5.11)
RDW: 26.2 % — ABNORMAL HIGH (ref 11.5–15.5)
WBC Count: 5.7 10*3/uL (ref 4.0–10.5)
nRBC: 0 % (ref 0.0–0.2)

## 2022-11-29 LAB — MAGNESIUM: Magnesium: 1.3 mg/dL — ABNORMAL LOW (ref 1.7–2.4)

## 2022-11-29 MED ORDER — SODIUM CHLORIDE 0.9% FLUSH
10.0000 mL | INTRAVENOUS | Status: DC | PRN
  Administered 2022-11-29: 10 mL

## 2022-11-29 MED ORDER — POTASSIUM CHLORIDE IN NACL 20-0.9 MEQ/L-% IV SOLN
Freq: Once | INTRAVENOUS | Status: AC
  Filled 2022-11-29: qty 1000

## 2022-11-29 MED ORDER — SODIUM CHLORIDE 0.9 % IV SOLN: Freq: Once | INTRAVENOUS | Status: AC

## 2022-11-29 MED ORDER — HEPARIN SOD (PORK) LOCK FLUSH 100 UNIT/ML IV SOLN
500.0000 [IU] | Freq: Once | INTRAVENOUS | Status: AC | PRN
  Administered 2022-11-29: 500 [IU]

## 2022-11-29 MED ORDER — SODIUM CHLORIDE 0.9 % IV SOLN
800.0000 mg/m2 | Freq: Once | INTRAVENOUS | Status: AC
  Administered 2022-11-29: 1710 mg via INTRAVENOUS
  Filled 2022-11-29: qty 44.97

## 2022-11-29 MED ORDER — POTASSIUM CHLORIDE CRYS ER 20 MEQ PO TBCR
20.0000 meq | EXTENDED_RELEASE_TABLET | Freq: Two times a day (BID) | ORAL | 1 refills | Status: DC
Start: 2022-11-29 — End: 2022-12-29

## 2022-11-29 MED ORDER — FUROSEMIDE 20 MG PO TABS: 20.0000 mg | ORAL_TABLET | Freq: Every day | ORAL | 1 refills | Status: DC

## 2022-11-29 MED ORDER — SODIUM CHLORIDE 0.9 % IV SOLN
25.0000 mg/m2 | Freq: Once | INTRAVENOUS | Status: AC
  Administered 2022-11-29: 50 mg via INTRAVENOUS
  Filled 2022-11-29: qty 50

## 2022-11-29 MED ORDER — MAGNESIUM OXIDE -MG SUPPLEMENT 400 (240 MG) MG PO TABS
400.0000 mg | ORAL_TABLET | Freq: Two times a day (BID) | ORAL | 1 refills | Status: DC
Start: 2022-11-29 — End: 2022-12-29
  Filled 2022-11-29: qty 60, 30d supply, fill #0

## 2022-11-29 MED ORDER — MAGNESIUM SULFATE 2 GM/50ML IV SOLN
2.0000 g | Freq: Once | INTRAVENOUS | Status: AC
  Administered 2022-11-29: 2 g via INTRAVENOUS
  Filled 2022-11-29: qty 50

## 2022-11-29 MED ORDER — SODIUM CHLORIDE 0.9 % IV SOLN
16.0000 mg | Freq: Once | INTRAVENOUS | Status: AC
  Administered 2022-11-29: 16 mg via INTRAVENOUS
  Filled 2022-11-29: qty 8

## 2022-11-29 MED ORDER — LACTULOSE 10 GM/15ML PO SOLN: 20.0000 g | Freq: Two times a day (BID) | ORAL | 1 refills | Status: DC | PRN

## 2022-11-29 MED ORDER — MEGESTROL ACETATE 625 MG/5ML PO SUSP
625.0000 mg | Freq: Every day | ORAL | 1 refills | Status: DC
Start: 2022-11-29 — End: 2022-12-29

## 2022-11-29 MED ORDER — PROCHLORPERAZINE MALEATE 10 MG PO TABS: 10.0000 mg | ORAL_TABLET | Freq: Four times a day (QID) | ORAL | 2 refills | Status: DC | PRN

## 2022-11-29 MED ORDER — FUROSEMIDE 10 MG/ML IJ SOLN
20.0000 mg | Freq: Once | INTRAMUSCULAR | Status: AC
  Administered 2022-11-29: 20 mg via INTRAVENOUS
  Filled 2022-11-29: qty 2

## 2022-11-29 MED ORDER — SODIUM CHLORIDE 0.9 % IV SOLN
150.0000 mg | Freq: Once | INTRAVENOUS | Status: AC
  Administered 2022-11-29: 150 mg via INTRAVENOUS
  Filled 2022-11-29: qty 150

## 2022-11-29 MED ORDER — MAGNESIUM OXIDE -MG SUPPLEMENT 400 (240 MG) MG PO TABS: 400.0000 mg | ORAL_TABLET | Freq: Two times a day (BID) | ORAL | 1 refills | Status: DC

## 2022-11-29 MED ORDER — SODIUM CHLORIDE 0.9% FLUSH
10.0000 mL | Freq: Once | INTRAVENOUS | Status: AC
  Administered 2022-11-29: 10 mL

## 2022-11-29 NOTE — Telephone Encounter (Signed)
Checked patients blood pressure for a second time it was 144/80 made physician aware. Patient stated she did not take her BP meds

## 2022-11-29 NOTE — Progress Notes (Signed)
Per Clayborn Heron, NP ok to proceed with lab results 11/29/22 and ok to infuse cisplatin post hydration along with cisplatin today

## 2022-12-01 ENCOUNTER — Other Ambulatory Visit: Payer: Self-pay

## 2022-12-01 ENCOUNTER — Encounter (HOSPITAL_COMMUNITY): Payer: Self-pay

## 2022-12-01 ENCOUNTER — Emergency Department (HOSPITAL_COMMUNITY)
Admission: EM | Admit: 2022-12-01 | Discharge: 2022-12-01 | Disposition: A | Discharge status: Home / Self Care | Payer: 59 | Attending: Emergency Medicine | Admitting: Emergency Medicine

## 2022-12-01 ENCOUNTER — Emergency Department (HOSPITAL_COMMUNITY): Payer: 59

## 2022-12-01 DIAGNOSIS — R42 Dizziness and giddiness: Secondary | ICD-10-CM | POA: Insufficient documentation

## 2022-12-01 DIAGNOSIS — E162 Hypoglycemia, unspecified: Secondary | ICD-10-CM | POA: Insufficient documentation

## 2022-12-01 DIAGNOSIS — Z8509 Personal history of malignant neoplasm of other digestive organs: Secondary | ICD-10-CM | POA: Insufficient documentation

## 2022-12-01 LAB — CBC WITH DIFFERENTIAL/PLATELET
Abs Immature Granulocytes: 0.03 10*3/uL (ref 0.00–0.07)
Basophils Absolute: 0 10*3/uL (ref 0.0–0.1)
Basophils Relative: 0 %
Eosinophils Absolute: 0 10*3/uL (ref 0.0–0.5)
Eosinophils Relative: 0 %
HCT: 30.6 % — ABNORMAL LOW (ref 36.0–46.0)
Hemoglobin: 9.7 g/dL — ABNORMAL LOW (ref 12.0–15.0)
Immature Granulocytes: 1 %
Lymphocytes Relative: 11 %
Lymphs Abs: 0.7 10*3/uL (ref 0.7–4.0)
MCH: 38.3 pg — ABNORMAL HIGH (ref 26.0–34.0)
MCHC: 31.7 g/dL (ref 30.0–36.0)
MCV: 120.9 fL — ABNORMAL HIGH (ref 80.0–100.0)
Monocytes Absolute: 0.1 10*3/uL (ref 0.1–1.0)
Monocytes Relative: 2 %
Neutro Abs: 5.7 10*3/uL (ref 1.7–7.7)
Neutrophils Relative %: 86 %
Platelets: 130 10*3/uL — ABNORMAL LOW (ref 150–400)
RBC: 2.53 MIL/uL — ABNORMAL LOW (ref 3.87–5.11)
RDW: 25.6 % — ABNORMAL HIGH (ref 11.5–15.5)
WBC: 6.6 10*3/uL (ref 4.0–10.5)
nRBC: 0 % (ref 0.0–0.2)

## 2022-12-01 LAB — COMPREHENSIVE METABOLIC PANEL
ALT: 59 U/L — ABNORMAL HIGH (ref 0–44)
AST: 114 U/L — ABNORMAL HIGH (ref 15–41)
Albumin: 1.6 g/dL — ABNORMAL LOW (ref 3.5–5.0)
Alkaline Phosphatase: 125 U/L (ref 38–126)
Anion gap: 15 (ref 5–15)
BUN: 13 mg/dL (ref 8–23)
CO2: 14 mmol/L — ABNORMAL LOW (ref 22–32)
Calcium: 8.2 mg/dL — ABNORMAL LOW (ref 8.9–10.3)
Chloride: 103 mmol/L (ref 98–111)
Creatinine, Ser: 1.41 mg/dL — ABNORMAL HIGH (ref 0.44–1.00)
GFR, Estimated: 41 mL/min — ABNORMAL LOW (ref >60)
Glucose, Bld: 86 mg/dL (ref 70–99)
Potassium: 4.3 mmol/L (ref 3.5–5.1)
Sodium: 132 mmol/L — ABNORMAL LOW (ref 135–145)
Total Bilirubin: 4.4 mg/dL — ABNORMAL HIGH (ref 0.3–1.2)
Total Protein: 5.9 g/dL — ABNORMAL LOW (ref 6.5–8.1)

## 2022-12-01 LAB — CBG MONITORING, ED
Glucose-Capillary: 73 mg/dL (ref 70–99)
Glucose-Capillary: 100 mg/dL — ABNORMAL HIGH (ref 70–99)
Glucose-Capillary: 103 mg/dL — ABNORMAL HIGH (ref 70–99)

## 2022-12-01 MED ORDER — SODIUM CHLORIDE 0.9 % IV BOLUS
1000.0000 mL | Freq: Once | INTRAVENOUS | Status: AC
  Administered 2022-12-01: 1000 mL via INTRAVENOUS

## 2022-12-01 NOTE — ED Notes (Signed)
ED Provider at bedside. 

## 2022-12-01 NOTE — ED Provider Notes (Signed)
[place] EMERGENCY DEPARTMENT AT [facility] Provider Note   CSN: [id number] Arrival date & time: 12/01/22  2002     History  Chief Complaint  Patient presents with   Hypoglycemia    Connie [name] is a 67 y.o. female history of cholangiocarcinoma, here presenting with hypoglycemia and altered mental status.  Patient states that she is just not hungry and has not been eating all day.  Patient was noted by family altered and confused.  When EMS got there, patient was noted to have CBG in the 40s.  Patient was also hypotensive.  Patient was given IV dextrose.  CBG on arrival was 73.  Patient states that she is not diabetic and she does not use insulin.   The history is provided by the patient.       Home Medications Prior to Admission medications   Medication Sig Start Date End Date Taking? Authorizing Provider  acetaminophen (TYLENOL) 500 MG tablet Take 1,000 mg by mouth every 6 (six) hours as needed for mild pain.    [provider]  amLODipine (NORVASC) 5 MG tablet Take 1 tablet by mouth daily. 08/25/21   [provider]  diclofenac Sodium (VOLTAREN) 1 % GEL Apply 2 g topically 2 (two) times daily as needed (pain).    [provider]  furosemide (LASIX) 20 MG tablet Take 1 tablet (20 mg total) by mouth daily as needed as directed for leg swelling for no more than 5 days. 11/29/22   Pollyann Samples, NP  gabapentin (NEURONTIN) 100 MG capsule TAKE 1 CAPSULE BY MOUTH 3 TIMES  DAILY 10/24/22   Pollyann Samples, NP  lactulose (CHRONULAC) 10 GM/15ML solution Take 30 mLs (20 g total) by mouth 2 (two) times daily as needed for mild constipation (titrate dose to have bowel movement 2-3 times a day). 11/29/22   Pollyann Samples, NP  latanoprost (XALATAN) 0.005 % ophthalmic solution Place 1 drop into both eyes at bedtime.    [provider]  lidocaine-prilocaine (EMLA) cream Apply 1 Application topically as needed. Apply 5ml to Port-a-Cath site 45 mins  to 1 hr before port-a-cath access. 09/27/22   Malachy Mood, MD  lipase/protease/amylase (CREON) 12000-38000 units CPEP capsule Take 1 capsule (12,000 Units total) by mouth 3 (three) times daily with meals. 05/31/22   Malachy Mood, MD  magnesium oxide (MAG-OX) 400 (240 Mg) MG tablet Take 1 tablet (400 mg total) by mouth 2 (two) times daily. 11/29/22   Pollyann Samples, NP  megestrol (MEGACE ES) 625 MG/5ML suspension Take 5 mLs (625 mg total) by mouth daily. 11/29/22   Pollyann Samples, NP  ondansetron (ZOFRAN) 8 MG tablet Take 1 tablet (8 mg total) by mouth every 8 (eight) hours as needed for nausea or vomiting. 09/07/22   Malachy Mood, MD  oxyCODONE (OXY IR/ROXICODONE) 5 MG immediate release tablet Take 1 tablet (5 mg total) by mouth every 6 (six) hours as needed for severe pain. 10/26/22   Artis Delay, MD  pantoprazole (PROTONIX) 40 MG tablet TAKE 1 TABLET BY MOUTH DAILY 10/12/22   Malachy Mood, MD  polyethylene glycol (MIRALAX / GLYCOLAX) 17 g packet Take 17 g by mouth 2 (two) times daily. 03/06/21   Rolly Salter, MD  potassium chloride SA (KLOR-CON M) 20 MEQ tablet Take 1 tablet (20 mEq total) by mouth 2 (two) times daily.  Take only when taking furosemide (lasix) as directed. 11/29/22   Pollyann Samples, NP  prochlorperazine (COMPAZINE) 10 MG  tablet Take 1 tablet (10 mg total) by mouth every 6 (six) hours as needed for nausea or vomiting. 11/29/22   Pollyann Samples, NP      Allergies    Patient has no known allergies.    Review of Systems   Review of Systems  Psychiatric/Behavioral:  Positive for confusion.   All other systems reviewed and are negative.   Physical Exam Updated Vital Signs BP (!) 100/59   Pulse 77   Temp (!) 97.5 F (36.4 C) (Oral)   Resp 12   SpO2 100%  Physical Exam Vitals and nursing note reviewed.  Constitutional:      Comments: Chronically ill  HENT:     Head: Normocephalic.     Nose: Nose normal.     Mouth/Throat:     Mouth: Mucous membranes are dry.  Eyes:     Extraocular  Movements: Extraocular movements intact.     Pupils: Pupils are equal, round, and reactive to light.  Cardiovascular:     Rate and Rhythm: Normal rate and regular rhythm.     Pulses: Normal pulses.     Heart sounds: Normal heart sounds.  Pulmonary:     Effort: Pulmonary effort is normal.     Breath sounds: Normal breath sounds.  Abdominal:     General: Abdomen is flat.     Comments: Patient has a firm mass in the epigastrium.  This is consistent with her known cholangiocarcinoma  Musculoskeletal:     Cervical back: Normal range of motion and neck supple.  Skin:    General: Skin is warm.  Neurological:     General: No focal deficit present.     Mental Status: She is oriented to person, place, and time.  Psychiatric:        Mood and Affect: Mood normal.        Behavior: Behavior normal.     ED Results / Procedures / Treatments   Labs (all labs ordered are listed, but only abnormal results are displayed) Labs Reviewed  CBC WITH DIFFERENTIAL/PLATELET  COMPREHENSIVE METABOLIC PANEL  CBG MONITORING, ED    EKG None  Radiology CT HEAD WO CONTRAST ( )  Result Date: 12/01/2022 CLINICAL DATA:  Mental status change, unknown cause dizziness, weakness EXAM: CT HEAD WITHOUT CONTRAST TECHNIQUE: Contiguous axial images were obtained from the base of the skull through the vertex without intravenous contrast. RADIATION DOSE REDUCTION: This exam was performed according to the departmental dose-optimization program which includes automated exposure control, adjustment of the mA and/or kV according to patient size and/or use of iterative reconstruction technique. COMPARISON:  None Available. FINDINGS: Brain: No acute intracranial abnormality. Specifically, no hemorrhage, hydrocephalus, mass lesion, acute infarction, or significant intracranial injury. Vascular: No hyperdense vessel or unexpected calcification. Skull: No acute calvarial abnormality. Sinuses/Orbits: No acute findings Other: None  IMPRESSION: No acute intracranial abnormality. Electronically Signed   By: Charlett Nose M.D.   On: 12/01/2022 21:31    Procedures Procedures    Medications Ordered in ED Medications  sodium chloride 0.9 % bolus 1,000 mL (1,000 mLs Intravenous New Bag/Given 12/01/22 2049)    ED Course/ Medical Decision Making/ A&P                                 Medical Decision Making Connie [name] is a 67 y.o. female here presenting with blurry vision and hypoglycemia.  Patient has history of cholangiocarcinoma.  Patient has not  been eating today.  Patient denies worsening abdominal pain or vomiting.  She was hypoglycemic which is likely from decreased p.o. intake.  Will p.o. trial and check labs and hydrate and reassess.  11:01 PM Patient has been observed in the ED for 3 hours.  Patient's blood sugar stable around 100.  Patient's creatinine slightly elevated at 1.4.  Patient was given IV fluids and continue to able to tolerate p.o.  At this point, patient is stable for discharge.  Problems Addressed: Hypoglycemia: acute illness or injury  Amount and/or Complexity of Data Reviewed Labs: ordered. Decision-making details documented in ED Course. Radiology: ordered.    Final Clinical Impression(s) / ED Diagnoses Final diagnoses:  None    Rx / DC Orders ED Discharge Orders     None         Charlynne Pander, MD 12/01/22 2302

## 2022-12-01 NOTE — Discharge Instructions (Signed)
Your blood sugar is low from not eating much.  You need to continue taking your Compazine as prescribed by your doctor to increase your appetite.  You need to try and drink Ensure or juice or Gatorade.  Please follow-up with your oncologist  Return to ER if you have lethargy, severe abdominal pain, vomiting

## 2022-12-01 NOTE — ED Triage Notes (Signed)
Pt presents via EMS c/o blurred vision and weakness. EMS reports hypoglycemia with CBG in 40s and hypotension with systolic BP in 70-80s. Given IV dextrose and IV NS with improvement. A&O x4 on arrival.

## 2022-12-05 ENCOUNTER — Other Ambulatory Visit: Payer: Self-pay

## 2022-12-06 ENCOUNTER — Inpatient Hospital Stay: Payer: 59 | Admitting: Dietician

## 2022-12-06 ENCOUNTER — Inpatient Hospital Stay: Payer: 59

## 2022-12-06 ENCOUNTER — Inpatient Hospital Stay: Payer: 59 | Admitting: Adult Health

## 2022-12-10 ENCOUNTER — Other Ambulatory Visit: Payer: Self-pay

## 2022-12-12 MED FILL — Fosaprepitant Dimeglumine For IV Infusion 150 MG (Base Eq): INTRAVENOUS | Qty: 5 | Status: AC

## 2022-12-13 ENCOUNTER — Other Ambulatory Visit: Payer: Self-pay

## 2022-12-13 ENCOUNTER — Other Ambulatory Visit: Payer: 59

## 2022-12-13 ENCOUNTER — Inpatient Hospital Stay: Payer: 59 | Admitting: Dietician

## 2022-12-13 ENCOUNTER — Telehealth: Payer: Self-pay | Admitting: Hematology

## 2022-12-13 ENCOUNTER — Inpatient Hospital Stay: Payer: 59 | Admitting: Nurse Practitioner

## 2022-12-13 ENCOUNTER — Inpatient Hospital Stay: Payer: 59

## 2022-12-13 ENCOUNTER — Ambulatory Visit: Payer: 59 | Admitting: Nurse Practitioner

## 2022-12-13 NOTE — Progress Notes (Unsigned)
Called patient  because she had not showed up for her portflush. She stated she forgot she had an appointment today. I sent a message to everyone to let them know she will not be here and sent a message to scheduling to have her rescheduled.

## 2022-12-13 NOTE — Progress Notes (Deleted)
Patient Care Team: Loura Back, NP as PCP - General (Nurse Practitioner) Thurmon Fair, MD as PCP - Cardiology (Cardiology) Annamarie Major, CRNP as Nurse Practitioner (Transplant Hepatology) Simonne Come, MD as Consulting Physician (Interventional Radiology) Malachy Mood, MD as Consulting Physician (Hematology and Oncology)   CHIEF COMPLAINT: Follow up mixed [facility] and bile duct cancer   Oncology History Overview Note   Cancer Staging  Mixed hepatocellular and bile duct carcinoma [facility]) Staging form: Liver, AJCC 8th Edition - Clinical stage from 03/20/2022: Stage IVA (cT3, cN1, cM0) - Signed by Malachy Mood, MD on 03/20/2022     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  Underlying hepatic cirrhosis suspected.   2.5 cm indeterminate central hypoechoic hepatic lesion. See above comment.   02/22/2021 Imaging   CLINICAL DATA:  Right-sided abdominal pain.   EXAM: ABDOMEN ULTRASOUND COMPLETE  IMPRESSION: 1. Cirrhosis and small ascites. 2. Gallbladder sludge. No sonographic evidence of acute cholecystitis.   02/22/2021 Imaging   CLINICAL DATA:  Abdominal pain.  Concern for diverticulitis.   EXAM: CT ABDOMEN AND PELVIS WITH CONTRAST  IMPRESSION: 1. Acute pancreatitis. No abscess or pseudocyst. 2. Cirrhosis with small ascites. 3. Colonic diverticulosis. No bowel obstruction. Normal appendix. 4. Aortic Atherosclerosis (ICD10-I70.0).   03/02/2021 Tumor Marker   CA 19-9 = 45 (^) AFP = 11 (^)   03/03/2021 Imaging   CLINICAL DATA:  Pancreatitis, worsening abdominal pain.   EXAM: CT ABDOMEN AND PELVIS WITH CONTRAST  IMPRESSION: 1. Multiple suspicious lesions in the right hepatic lobe. Patient has underlying cirrhosis and findings are concerning for  multifocal hepatocellular carcinoma. Metastatic disease is also in the differential diagnosis. Recommend MRI of the abdomen with and without contrast. If the patient cannot tolerate a MRI, consider a multiphase liver CT. 2. Increased inflammatory changes in the upper abdomen compatible with pancreatitis and a large developing pseudocyst. This is developing pseudocyst measures up to 14 cm in size. 3. Cirrhosis with evidence of portal hypertension demonstrated by esophageal varices and varices draining into the IVC. 4. Indeterminate 3 mm pulmonary nodule in the lingula. Management of this pulmonary nodule depends on the etiology of the liver lesions.   03/04/2021 Imaging   EXAM: MRI ABDOMEN WITHOUT AND WITH CONTRAST (INCLUDING MRCP)  IMPRESSION: 1. Multiple enhancing lesions are noted throughout the right lobe of the liver, as detailed above. These have indeterminate imaging characteristics. Given the background of hepatic cirrhosis, the possibility of multifocal infiltrative hepatocellular carcinoma should be considered, although none of these lesions meet definite imaging criteria to establish that diagnosis. Correlation with AFP level is recommended. Other differential considerations include malignant and benign etiologies such as fibrolamellar hepatocellular carcinoma and multifocal focal nodular hyperplasia (FNH). Consideration for repeat abdominal MRI with and without IV gadolinium (Eovist) is also suggested, although biopsy may ultimately be required to establish a tissue diagnosis. 2. 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 3. Trace volume of perihepatic ascites.   09/22/2021 Tumor Marker   CA 19-9 = 190 (^) AFP = 124 (^)   01/19/2022 Tumor Marker   CA 19-9 = 816 (^) AFP = 307 (^)   02/06/2022 Imaging   EXAM: MRI ABDOMEN WITHOUT AND WITH CONTRAST (INCLUDING MRCP)  IMPRESSION: 1. Morphologic features of the liver  compatible with  cirrhosis. 2. Extensive enhancing liver lesions are identified throughout both lobes of liver which demonstrate marked progression compared with the previous exam. Enhancing lesions demonstrate washout on the delayed images. In the setting of cirrhosis with progressively elevated alpha fetoprotein levels imaging findings are highly worrisome for multifocal hepatocellular carcinoma. 3. Enlarged upper abdominal lymph nodes identified. Cannot exclude underlying nodal metastasis. 4. There are several lesions within the central mesentery in the distribution of previous large pseudo cysts. The largest measures 5.0 x 3.1 cm. This is favored to represent sequelae of prior hemorrhagic pancreatitis with pseudocyst formation.   02/23/2022 Initial Diagnosis   Hepatocellular carcinoma (HCC)   03/20/2022 Cancer Staging   Staging form: Liver, AJCC 8th Edition - Clinical stage from 03/20/2022: Stage IVA (cT3, cN1, cM0) - Signed by Malachy Mood, MD on 03/20/2022   03/21/2022 - 04/20/2022 Chemotherapy   Patient is on Treatment Plan : LUNG NSCLC Durvalumab (1500) q28d     04/04/2022 Pathology Results   A. LIVER, RIGHT LOBE, BIOPSY: Well to moderately differentiated CK7 positive adenocarcinoma (see comment)  COMMENT:  Given the cytohistomorphology and this immunohistochemical pattern of cytokeratin 7 positivity, the differential diagnosis would include a pancreaticobiliary or upper gastrointestinal intestinal tract primary or possibly a lung primary (not favored).    05/11/2022 - 05/11/2022 Chemotherapy   Patient is on Treatment Plan : BLADDER Durvalumab (10) q14d     Cholangiocarcinoma of liver (HCC)  04/23/2022 Initial Diagnosis   Cholangiocarcinoma of liver (HCC)   05/11/2022 - 05/11/2022 Chemotherapy   Patient is on Treatment Plan : BILIARY TRACT Cisplatin + Gemcitabine D1,8 q21d     05/11/2022 - 07/27/2022 Chemotherapy   Patient is on Treatment Plan : PANCREAS GemOx q14d     08/31/2022  -  Chemotherapy   Patient is on Treatment Plan : BILIARY TRACT Cisplatin + Gemcitabine D1,8 + Durvalumab (1500) D1 q21d / Durvalumab (1500) q28d        CURRENT THERAPY: Cisplatin+Gemcitabine D1,8+ Durvalumab (1500) D1 q21d/Durvaalumab (1500) q 28d; Durvalumab stopped 11/29/22 due to 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 Last seen by me 11/29/22 and proceeded with gem/cis, durvalumab was stopped.   ROS   Past Medical History:  Diagnosis Date   Alcohol abuse    Alcoholic cirrhosis of liver (HCC)    Atrial fibrillation with RVR (HCC) 08/02/2019   Cataract    Chronic kidney disease    Helicobacter pylori gastritis 08/07/2019   Hepatitis C antibody positive in blood - negative RNA    Hypertension    liver ca 01/2022   bile duct ca 05/2022   Pancreatic pseudocyst 03/04/2021     Past Surgical History:  Procedure Laterality Date   BIOPSY  08/03/2019   Procedure: BIOPSY;  Surgeon: Iva Boop, MD;  Location: [facility] ENDOSCOPY;  Service: Endoscopy;;   ESOPHAGOGASTRODUODENOSCOPY (EGD) WITH PROPOFOL N/A 08/03/2019   Procedure: ESOPHAGOGASTRODUODENOSCOPY (EGD) WITH PROPOFOL;  Surgeon: Iva Boop, MD;  Location: [facility] ENDOSCOPY;  Service: Endoscopy;  Laterality: N/A;   IR IMAGING GUIDED PORT INSERTION  05/29/2022   IR RADIOLOGIST EVAL & MGMT  02/14/2022   IR RADIOLOGIST EVAL & MGMT  03/14/2022     Outpatient Encounter Medications as of 12/13/2022  Medication Sig Note   acetaminophen (TYLENOL) 500 MG tablet Take 1,000 mg by mouth every 6 (six) hours as needed for mild pain.    amLODipine (NORVASC) 10 MG tablet Take 10 mg by mouth daily.    furosemide (  LASIX) 20 MG tablet Take 1 tablet (20 mg total) by mouth daily as needed as directed for leg swelling for no more than 5 days.    gabapentin (NEURONTIN) 100 MG capsule TAKE 1 CAPSULE BY MOUTH 3 TIMES  DAILY (Patient taking differently: Take 100 mg by mouth 2 (two) times daily.)     lactulose (CHRONULAC) 10 GM/15ML solution Take 30 mLs (20 g total) by mouth 2 (two) times daily as needed for mild constipation (titrate dose to have bowel movement 2-3 times a day).    latanoprost (XALATAN) 0.005 % ophthalmic solution Place 1 drop into both eyes at bedtime.    lidocaine-prilocaine (EMLA) cream Apply 1 Application topically as needed. Apply 5ml to Port-a-Cath site 45 mins to 1 hr before port-a-cath access.    lipase/protease/amylase (CREON) 12000-38000 units CPEP capsule Take 1 capsule (12,000 Units total) by mouth 3 (three) times daily with meals.    magnesium oxide (MAG-OX) 400 (240 Mg) MG tablet Take 1 tablet (400 mg total) by mouth 2 (two) times daily.    megestrol (MEGACE ES) 625 MG/5ML suspension Take 5 mLs (625 mg total) by mouth daily. 12/01/2022: Hasn't started   ondansetron (ZOFRAN) 8 MG tablet Take 1 tablet (8 mg total) by mouth every 8 (eight) hours as needed for nausea or vomiting.    oxyCODONE (OXY IR/ROXICODONE) 5 MG immediate release tablet Take 1 tablet (5 mg total) by mouth every 6 (six) hours as needed for severe pain.    pantoprazole (PROTONIX) 40 MG tablet TAKE 1 TABLET BY MOUTH DAILY    polyethylene glycol (MIRALAX / GLYCOLAX) 17 g packet Take 17 g by mouth 2 (two) times daily. (Patient taking differently: Take 17 g by mouth daily as needed for moderate constipation.)    potassium chloride SA (KLOR-CON M) 20 MEQ tablet Take 1 tablet (20 mEq total) by mouth 2 (two) times daily.  Take only when taking furosemide (lasix) as directed.    prochlorperazine (COMPAZINE) 10 MG tablet Take 1 tablet (10 mg total) by mouth every 6 (six) hours as needed for nausea or vomiting.    No facility-administered encounter medications on file as of 12/13/2022.     There were no vitals filed for this visit. There is no height or weight on file to calculate BMI.   PHYSICAL EXAM GENERAL:alert, no distress and comfortable SKIN: no rash  EYES: sclera clear NECK: without mass LYMPH:   no palpable cervical or supraclavicular lymphadenopathy  LUNGS: clear with normal breathing effort HEART: regular rate & rhythm, no lower extremity edema ABDOMEN: abdomen soft, non-tender and normal bowel sounds NEURO: alert & oriented x 3 with fluent speech, no focal motor/sensory deficits Breast exam:  PAC without erythema    CBC    Component Value Date/Time   WBC 6.6 12/01/2022 2058   RBC 2.53 (L) 12/01/2022 2058   HGB 9.7 (L) 12/01/2022 2058   HGB 9.8 (L) 11/29/2022 0907   HCT 30.6 (L) 12/01/2022 2058   PLT 130 (L) 12/01/2022 2058   PLT 146 (L) 11/29/2022 0907   MCV 120.9 (H) 12/01/2022 2058   MCH 38.3 (H) 12/01/2022 2058   MCHC 31.7 12/01/2022 2058   RDW 25.6 (H) 12/01/2022 2058   LYMPHSABS 0.7 12/01/2022 2058   MONOABS 0.1 12/01/2022 2058   EOSABS 0.0 12/01/2022 2058   BASOSABS 0.0 12/01/2022 2058     CMP     Component Value Date/Time   NA 132 (L) 12/01/2022 2058   K 4.3 12/01/2022 2058  CL 103 12/01/2022 2058   CO2 14 (L) 12/01/2022 2058   GLUCOSE 86 12/01/2022 2058   BUN 13 12/01/2022 2058   CREATININE 1.41 (H) 12/01/2022 2058   CREATININE 0.93 11/29/2022 0907   CALCIUM 8.2 (L) 12/01/2022 2058   CALCIUM 11.1 (H) 03/30/2022 1051   PROT 5.9 (L) 12/01/2022 2058   ALBUMIN 1.6 (L) 12/01/2022 2058   AST 114 (H) 12/01/2022 2058   AST 145 (H) 11/29/2022 0907   ALT 59 (H) 12/01/2022 2058   ALT 58 (H) 11/29/2022 0907   ALKPHOS 125 12/01/2022 2058   BILITOT 4.4 (H) 12/01/2022 2058   BILITOT 3.4 (H) 11/29/2022 0907   GFRNONAA 41 (L) 12/01/2022 2058   GFRNONAA >60 11/29/2022 0907   GFRAA 45 (L) 08/04/2019 0337     ASSESSMENT & PLAN:Connie [name] is a 67 y.o. female with    Mixed HCC and cholangiocarcinoma, unresectable, with liver metastasis, stage IV  -Liver cirrhosis and a 2.5cm mass was found in 07/2019 but she did not follow up -due to rising AFP, she had MRI on 02/06/2022 which showed extensive liver lesions (largest 13cm) throughout both lobes with marked  progression from prior MRI; enlarged upper abdominal lymph nodes, no distant mets -she started Durvaluamb every 4 weeks, and Tremelimumab 300mg  once on day 1 of cycle 1, on 03/21/2022.  -Although she had typical image findings of HCC, she underwent liver biopsy since CA 19-9 was also elevated; biopsy showed well to moderately differentiated CK7 positive adenocarcinoma; the IHC staining profile includes pancreaticobiliary or upper GI primary, and possibly lung but not favored. EGD was negative -She has mixed HCC and cholangiocarcinoma features.  -Began GEMOX q2 weeks on 05/10/22 and continues durvalumab q4 weeks  -CT 06/28/22 showed stable disease; she completed a total of 6 cycles -Her AFP tumor marker significantly increased, but CA 19-9 was trending down.  Restaging MRI 08/01/2022 showed disease progression in the liver -She began second line cisplatin/gemcitabin on days 1, 8 and atezo/bev on day 1 q21 days on 08/30/22.  -AFP and CA 19-9 trending down on this regimen, c/w a clinical response to treatment. She is pleased. -Recent MRI showed stable large right liver mass and porta hepatis and portacaval lymph nodes, no biliary ductal dilatation.  No progression or sites of new disease -Labs showed progressive transaminitis and hyperbilirubinemia.  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  11/29/22: Durvalumab was stopped and she continues gem/cis q2 weeks   Alcoholic cirrhosis of liver with ascites (HCC) -f/u with liver clinic NP Dawn Drazak  -she also had HCV infection    Electrolyte abnormalities: Hypercalcemia, hypomagnesemia -PTH level low, so not primary hyperparathyroidism  -rpPTH normal -she received pamidronate x2, Ca normalized -Takes K with lasix only -Started mag-ox BID on 7/31      PLAN:  No orders of the defined types were placed in this encounter.     All questions were answered. The patient knows to  call the clinic with any problems, questions or concerns. No barriers to learning were detected. I spent *** counseling the patient face to face. The total time spent in the appointment was *** and more than 50% was on counseling, review of test results, and coordination of care.   Santiago Glad, NP-C @DATE @

## 2022-12-14 ENCOUNTER — Ambulatory Visit: Payer: 59

## 2022-12-15 ENCOUNTER — Ambulatory Visit: Payer: 59 | Admitting: Nurse Practitioner

## 2022-12-15 ENCOUNTER — Ambulatory Visit: Payer: 59

## 2022-12-15 ENCOUNTER — Other Ambulatory Visit: Payer: 59

## 2022-12-17 ENCOUNTER — Other Ambulatory Visit: Payer: Self-pay

## 2022-12-20 ENCOUNTER — Ambulatory Visit: Payer: 59

## 2022-12-20 ENCOUNTER — Ambulatory Visit: Payer: 59 | Admitting: Hematology

## 2022-12-20 ENCOUNTER — Other Ambulatory Visit: Payer: 59

## 2022-12-26 MED FILL — Fosaprepitant Dimeglumine For IV Infusion 150 MG (Base Eq): INTRAVENOUS | Qty: 5 | Status: AC

## 2022-12-27 ENCOUNTER — Inpatient Hospital Stay: Payer: 59 | Admitting: Dietician

## 2022-12-27 ENCOUNTER — Inpatient Hospital Stay (HOSPITAL_BASED_OUTPATIENT_CLINIC_OR_DEPARTMENT_OTHER): Payer: 59 | Admitting: Hematology

## 2022-12-27 ENCOUNTER — Other Ambulatory Visit: Payer: Self-pay

## 2022-12-27 ENCOUNTER — Inpatient Hospital Stay (HOSPITAL_COMMUNITY)
Admission: AD | Admit: 2022-12-27 | Discharge: 2022-12-29 | DRG: 951 | Disposition: A | Discharge status: Hospice | Payer: 59 | Source: Ambulatory Visit | Attending: Internal Medicine | Admitting: Internal Medicine

## 2022-12-27 ENCOUNTER — Inpatient Hospital Stay (HOSPITAL_COMMUNITY): Payer: 59

## 2022-12-27 ENCOUNTER — Encounter (HOSPITAL_COMMUNITY): Payer: Self-pay

## 2022-12-27 ENCOUNTER — Inpatient Hospital Stay: Payer: 59

## 2022-12-27 ENCOUNTER — Telehealth: Payer: Self-pay

## 2022-12-27 VITALS — BP 78/55 | HR 55 | Resp 15 | Ht 65.0 in | Wt 241.8 lb

## 2022-12-27 DIAGNOSIS — K5900 Constipation, unspecified: Secondary | ICD-10-CM | POA: Insufficient documentation

## 2022-12-27 DIAGNOSIS — I129 Hypertensive chronic kidney disease with stage 1 through stage 4 chronic kidney disease, or unspecified chronic kidney disease: Secondary | ICD-10-CM | POA: Diagnosis present

## 2022-12-27 DIAGNOSIS — Z95828 Presence of other vascular implants and grafts: Secondary | ICD-10-CM

## 2022-12-27 DIAGNOSIS — K7030 Alcoholic cirrhosis of liver without ascites: Secondary | ICD-10-CM | POA: Diagnosis present

## 2022-12-27 DIAGNOSIS — I959 Hypotension, unspecified: Secondary | ICD-10-CM

## 2022-12-27 DIAGNOSIS — E861 Hypovolemia: Secondary | ICD-10-CM

## 2022-12-27 DIAGNOSIS — Z79620 Long term (current) use of immunosuppressive biologic: Secondary | ICD-10-CM | POA: Insufficient documentation

## 2022-12-27 DIAGNOSIS — N179 Acute kidney failure, unspecified: Secondary | ICD-10-CM | POA: Diagnosis present

## 2022-12-27 DIAGNOSIS — G9341 Metabolic encephalopathy: Secondary | ICD-10-CM | POA: Diagnosis present

## 2022-12-27 DIAGNOSIS — K219 Gastro-esophageal reflux disease without esophagitis: Secondary | ICD-10-CM | POA: Diagnosis present

## 2022-12-27 DIAGNOSIS — Z6841 Body Mass Index (BMI) 40.0 and over, adult: Secondary | ICD-10-CM

## 2022-12-27 DIAGNOSIS — Z515 Encounter for palliative care: Principal | ICD-10-CM

## 2022-12-27 DIAGNOSIS — C240 Malignant neoplasm of extrahepatic bile duct: Secondary | ICD-10-CM | POA: Diagnosis present

## 2022-12-27 DIAGNOSIS — N189 Chronic kidney disease, unspecified: Secondary | ICD-10-CM | POA: Insufficient documentation

## 2022-12-27 DIAGNOSIS — C787 Secondary malignant neoplasm of liver and intrahepatic bile duct: Secondary | ICD-10-CM | POA: Diagnosis present

## 2022-12-27 DIAGNOSIS — I4891 Unspecified atrial fibrillation: Secondary | ICD-10-CM | POA: Diagnosis present

## 2022-12-27 DIAGNOSIS — E860 Dehydration: Secondary | ICD-10-CM | POA: Diagnosis present

## 2022-12-27 DIAGNOSIS — I9589 Other hypotension: Secondary | ICD-10-CM | POA: Diagnosis present

## 2022-12-27 DIAGNOSIS — C221 Intrahepatic bile duct carcinoma: Secondary | ICD-10-CM | POA: Diagnosis not present

## 2022-12-27 DIAGNOSIS — Z9221 Personal history of antineoplastic chemotherapy: Secondary | ICD-10-CM | POA: Insufficient documentation

## 2022-12-27 DIAGNOSIS — R978 Other abnormal tumor markers: Secondary | ICD-10-CM | POA: Insufficient documentation

## 2022-12-27 DIAGNOSIS — Z79899 Other long term (current) drug therapy: Secondary | ICD-10-CM

## 2022-12-27 DIAGNOSIS — R627 Adult failure to thrive: Secondary | ICD-10-CM | POA: Diagnosis present

## 2022-12-27 DIAGNOSIS — E8809 Other disorders of plasma-protein metabolism, not elsewhere classified: Secondary | ICD-10-CM | POA: Diagnosis present

## 2022-12-27 DIAGNOSIS — Z801 Family history of malignant neoplasm of trachea, bronchus and lung: Secondary | ICD-10-CM

## 2022-12-27 DIAGNOSIS — D61818 Other pancytopenia: Secondary | ICD-10-CM | POA: Diagnosis present

## 2022-12-27 DIAGNOSIS — K7040 Alcoholic hepatic failure without coma: Secondary | ICD-10-CM | POA: Diagnosis present

## 2022-12-27 DIAGNOSIS — C220 Liver cell carcinoma: Secondary | ICD-10-CM

## 2022-12-27 DIAGNOSIS — Z66 Do not resuscitate: Secondary | ICD-10-CM | POA: Diagnosis present

## 2022-12-27 DIAGNOSIS — N183 Chronic kidney disease, stage 3 unspecified: Secondary | ICD-10-CM | POA: Diagnosis present

## 2022-12-27 DIAGNOSIS — F17210 Nicotine dependence, cigarettes, uncomplicated: Secondary | ICD-10-CM | POA: Diagnosis present

## 2022-12-27 DIAGNOSIS — Z7189 Other specified counseling: Secondary | ICD-10-CM

## 2022-12-27 LAB — CMP (CANCER CENTER ONLY)
ALT: 50 U/L — ABNORMAL HIGH (ref 0–44)
AST: 132 U/L — ABNORMAL HIGH (ref 15–41)
Albumin: 1.5 g/dL — ABNORMAL LOW (ref 3.5–5.0)
Alkaline Phosphatase: 164 U/L — ABNORMAL HIGH (ref 38–126)
Anion gap: 7 (ref 5–15)
BUN: 15 mg/dL (ref 8–23)
CO2: 25 mmol/L (ref 22–32)
Calcium: 8.4 mg/dL — ABNORMAL LOW (ref 8.9–10.3)
Chloride: 105 mmol/L (ref 98–111)
Creatinine: 1.4 mg/dL — ABNORMAL HIGH (ref 0.44–1.00)
GFR, Estimated: 41 mL/min — ABNORMAL LOW (ref >60)
Glucose, Bld: 86 mg/dL (ref 70–99)
Potassium: 4.4 mmol/L (ref 3.5–5.1)
Sodium: 137 mmol/L (ref 135–145)
Total Bilirubin: 5.1 mg/dL (ref 0.3–1.2)
Total Protein: 5.4 g/dL — ABNORMAL LOW (ref 6.5–8.1)

## 2022-12-27 LAB — URINALYSIS, ROUTINE W REFLEX MICROSCOPIC
Glucose, UA: NEGATIVE mg/dL
Hgb urine dipstick: NEGATIVE
Ketones, ur: NEGATIVE mg/dL
Leukocytes,Ua: NEGATIVE
Nitrite: NEGATIVE
Protein, ur: NEGATIVE mg/dL
Specific Gravity, Urine: 1.018 (ref 1.005–1.030)
pH: 5 (ref 5.0–8.0)

## 2022-12-27 LAB — CBC WITH DIFFERENTIAL (CANCER CENTER ONLY)
Abs Immature Granulocytes: 0.04 10*3/uL (ref 0.00–0.07)
Basophils Absolute: 0 10*3/uL (ref 0.0–0.1)
Basophils Relative: 0 %
Eosinophils Absolute: 0.2 10*3/uL (ref 0.0–0.5)
Eosinophils Relative: 3 %
HCT: 26.6 % — ABNORMAL LOW (ref 36.0–46.0)
Hemoglobin: 9 g/dL — ABNORMAL LOW (ref 12.0–15.0)
Immature Granulocytes: 1 %
Lymphocytes Relative: 27 %
Lymphs Abs: 1.9 10*3/uL (ref 0.7–4.0)
MCH: 39.6 pg — ABNORMAL HIGH (ref 26.0–34.0)
MCHC: 33.8 g/dL (ref 30.0–36.0)
MCV: 117.2 fL — ABNORMAL HIGH (ref 80.0–100.0)
Monocytes Absolute: 1.6 10*3/uL — ABNORMAL HIGH (ref 0.1–1.0)
Monocytes Relative: 23 %
Neutro Abs: 3.2 10*3/uL (ref 1.7–7.7)
Neutrophils Relative %: 46 %
Platelet Count: 80 10*3/uL — ABNORMAL LOW (ref 150–400)
RBC: 2.27 MIL/uL — ABNORMAL LOW (ref 3.87–5.11)
RDW: 24.2 % — ABNORMAL HIGH (ref 11.5–15.5)
WBC Count: 7 10*3/uL (ref 4.0–10.5)
nRBC: 0 % (ref 0.0–0.2)

## 2022-12-27 LAB — GLUCOSE, CAPILLARY
Glucose-Capillary: 57 mg/dL — ABNORMAL LOW (ref 70–99)
Glucose-Capillary: 133 mg/dL — ABNORMAL HIGH (ref 70–99)
Glucose-Capillary: 63 mg/dL — ABNORMAL LOW (ref 70–99)
Glucose-Capillary: 132 mg/dL — ABNORMAL HIGH (ref 70–99)

## 2022-12-27 LAB — MAGNESIUM: Magnesium: 1.7 mg/dL (ref 1.7–2.4)

## 2022-12-27 LAB — HIV ANTIBODY (ROUTINE TESTING W REFLEX): HIV Screen 4th Generation wRfx: NONREACTIVE

## 2022-12-27 LAB — MRSA NEXT GEN BY PCR, NASAL: MRSA by PCR Next Gen: NOT DETECTED

## 2022-12-27 LAB — TSH: TSH: 4.847 u[IU]/mL — ABNORMAL HIGH (ref 0.350–4.500)

## 2022-12-27 MED ORDER — PANCRELIPASE (LIP-PROT-AMYL) 12000-38000 UNITS PO CPEP
12000.0000 [IU] | ORAL_CAPSULE | Freq: Three times a day (TID) | ORAL | Status: DC
  Administered 2022-12-27: 12000 [IU] via ORAL
  Filled 2022-12-27 (×4): qty 1

## 2022-12-27 MED ORDER — MEGESTROL ACETATE 400 MG/10ML PO SUSP
625.0000 mg | Freq: Every day | ORAL | Status: DC
  Filled 2022-12-27: qty 20

## 2022-12-27 MED ORDER — ORAL CARE MOUTH RINSE: 15.0000 mL | OROMUCOSAL | Status: DC | PRN

## 2022-12-27 MED ORDER — POLYETHYLENE GLYCOL 3350 17 G PO PACK: 17.0000 g | PACK | Freq: Every day | ORAL | Status: DC | PRN

## 2022-12-27 MED ORDER — LACTULOSE 10 GM/15ML PO SOLN
10.0000 g | Freq: Three times a day (TID) | ORAL | Status: DC
  Administered 2022-12-27 (×2): 10 g via ORAL
  Filled 2022-12-27 (×2): qty 15

## 2022-12-27 MED ORDER — HYDROMORPHONE HCL 1 MG/ML IJ SOLN
0.5000 mg | INTRAMUSCULAR | Status: DC | PRN
  Administered 2022-12-28 – 2022-12-29 (×7): 1 mg via INTRAVENOUS
  Filled 2022-12-27 (×7): qty 1

## 2022-12-27 MED ORDER — ONDANSETRON HCL 4 MG PO TABS: 4.0000 mg | ORAL_TABLET | Freq: Four times a day (QID) | ORAL | Status: DC | PRN

## 2022-12-27 MED ORDER — DOCUSATE SODIUM 100 MG PO CAPS
100.0000 mg | ORAL_CAPSULE | Freq: Two times a day (BID) | ORAL | Status: DC
  Administered 2022-12-27: 100 mg via ORAL
  Filled 2022-12-27: qty 1

## 2022-12-27 MED ORDER — CHLORHEXIDINE GLUCONATE CLOTH 2 % EX PADS
6.0000 | MEDICATED_PAD | Freq: Every day | CUTANEOUS | Status: DC
  Administered 2022-12-28 – 2022-12-29 (×2): 6 via TOPICAL

## 2022-12-27 MED ORDER — MUPIROCIN 2 % EX OINT: 1.0000 | TOPICAL_OINTMENT | Freq: Two times a day (BID) | CUTANEOUS | Status: DC

## 2022-12-27 MED ORDER — GABAPENTIN 100 MG PO CAPS
100.0000 mg | ORAL_CAPSULE | Freq: Two times a day (BID) | ORAL | Status: DC
  Administered 2022-12-27: 100 mg via ORAL
  Filled 2022-12-27: qty 1

## 2022-12-27 MED ORDER — CHLORHEXIDINE GLUCONATE CLOTH 2 % EX PADS: 6.0000 | MEDICATED_PAD | Freq: Every day | CUTANEOUS | Status: DC

## 2022-12-27 MED ORDER — SODIUM CHLORIDE 0.9 % IV SOLN: INTRAVENOUS | Status: DC

## 2022-12-27 MED ORDER — OXYCODONE HCL 5 MG PO TABS: 5.0000 mg | ORAL_TABLET | Freq: Four times a day (QID) | ORAL | Status: DC | PRN

## 2022-12-27 MED ORDER — ALBUTEROL SULFATE (2.5 MG/3ML) 0.083% IN NEBU: 2.5000 mg | INHALATION_SOLUTION | RESPIRATORY_TRACT | Status: DC | PRN

## 2022-12-27 MED ORDER — LATANOPROST 0.005 % OP SOLN
1.0000 [drp] | Freq: Every day | OPHTHALMIC | Status: DC
  Administered 2022-12-27 – 2022-12-28 (×2): 1 [drp] via OPHTHALMIC
  Filled 2022-12-27: qty 2.5

## 2022-12-27 MED ORDER — SODIUM CHLORIDE 0.9 % IV SOLN: INTRAVENOUS | Status: AC

## 2022-12-27 MED ORDER — DEXTROSE IN LACTATED RINGERS 5 % IV SOLN: INTRAVENOUS | Status: DC

## 2022-12-27 MED ORDER — ONDANSETRON HCL 4 MG/2ML IJ SOLN
4.0000 mg | Freq: Four times a day (QID) | INTRAMUSCULAR | Status: DC | PRN
  Administered 2022-12-27: 4 mg via INTRAVENOUS
  Filled 2022-12-27: qty 2

## 2022-12-27 MED ORDER — PANTOPRAZOLE SODIUM 40 MG PO TBEC: 40.0000 mg | DELAYED_RELEASE_TABLET | Freq: Every day | ORAL | Status: DC

## 2022-12-27 MED ORDER — IBUPROFEN 200 MG PO TABS: 400.0000 mg | ORAL_TABLET | Freq: Four times a day (QID) | ORAL | Status: DC | PRN

## 2022-12-27 MED ORDER — SODIUM CHLORIDE 0.9% FLUSH
10.0000 mL | Freq: Once | INTRAVENOUS | Status: AC
  Administered 2022-12-27: 10 mL

## 2022-12-27 NOTE — TOC Initial Note (Signed)
Transition of Care [facility]) - Initial/Assessment Note    Patient Details  Name: Connie [name] MRN: [id number] Date of Birth: 1956/03/17  [facility] of [facility]) CM/SW Contact:    Armanda Heritage, RN Phone Number: 12/27/2022, 5:35 PM  Clinical Narrative:                 CM received consult for home with hospice.  Patient is from home and lives with spouse.  CM was able to speak with spouse [facility], who reports patient has just been admitted to the hospital and while they are considering home with hospice as a possible plan they are not yet certain that they are ready to take this step.  Spouse requests that he and patient are given a little time to see how she progresses while in the hospital.  Spouse is agreeable for TOC to follow up tomorrow for determination.  Expected Discharge Plan: Home w Hospice Care Barriers to Discharge: Continued Medical Work up   Patient Goals and CMS Choice Patient states their goals for this hospitalization and ongoing recovery are:: possible home with hospice          Expected Discharge Plan and Services       Living arrangements for the past 2 months: Single Family Home                                      Prior Living Arrangements/Services Living arrangements for the past 2 months: Single Family Home Lives with:: Spouse Patient language and need for interpreter reviewed:: Yes Do you feel safe going back to the place where you live?: Yes      Need for Family Participation in Patient Care: Yes (Comment) Care giver support system in place?: Yes (comment)   Criminal Activity/Legal Involvement Pertinent to Current Situation/Hospitalization: No - Comment as needed  Activities of Daily Living      Permission Sought/Granted                  Emotional Assessment Appearance:: Appears stated age Attitude/Demeanor/Rapport: Engaged Affect (typically observed): Accepting Orientation: : Oriented to Self, Oriented to  Place, Oriented to  Time, Oriented to Situation   Psych Involvement: No (comment)  Admission diagnosis:  Hypotension [I95.9] Patient Active Problem List   Diagnosis Date Noted   Hypotension 12/27/2022   Goals of care, counseling/discussion 12/27/2022   Drug-induced hypotension 10/26/2022   Cancer associated pain 10/26/2022   Elevated liver enzymes 10/26/2022   Port-A-Cath in place 05/31/2022   Cholangiocarcinoma of liver (HCC) 04/23/2022   Hypercalcemia 03/28/2022   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) 02/23/2022   liver ca 01/2022   Elevated AFP 03/05/2021   Pancreatic pseudocyst 03/04/2021   Abdominal pain    Acute constipation    Acute pancreatitis 02/22/2021   Dehydration 02/22/2021   Fever 02/22/2021   Hypokalemia 02/22/2021   CKD (chronic kidney disease) stage 3, GFR 30-59 ml/min (HCC) 02/22/2021   Helicobacter pylori gastritis 08/07/2019   Abnormal liver diagnostic imaging    Erosive esophagitis    Erosive gastritis    Atrial fibrillation with RVR (HCC) 08/02/2019   Hematemesis with nausea    Alcoholic cirrhosis of liver with ascites (HCC)    Hepatitis C antibody positive in blood - negative RNA    PCP:  Loura Back, NP Pharmacy:   [facility] Delivery - [place], [place] - [address]  [address] [place] [address] Phone: [phone] Fax: [phone]  [facility]) - [place], [place] - [address] [facility] [address] [place] [address] Phone: [phone] Fax: [phone]     Social Determinants of Health (SDOH) Social History: SDOH Screenings   Food Insecurity: No Food Insecurity (01/19/2022)   Received from [facility], [facility]  Transportation Needs: No Transportation Needs (01/19/2022)   Received from [facility], [facility]  Utilities: Not At Risk (01/19/2022)   Received from [facility], [facility]  Financial Resource Strain: Low  Risk  (01/19/2022)   [facility]  Tobacco Use: High Risk (12/01/2022)   SDOH Interventions:     Readmission Risk Interventions     No data to display

## 2022-12-27 NOTE — Telephone Encounter (Signed)
Critical Lab Value reported:  Tbili 5.1  Dr. Mosetta Putt notified and pt will be admitted to hospital for liver failure.

## 2022-12-27 NOTE — Assessment & Plan Note (Signed)
-  Likely secondary to dehydration and cancer progression -Will check urine and blood culture, to rule out infection. -Will start her on IV fluids in the clinic, and admitted her to hospital.

## 2022-12-27 NOTE — Progress Notes (Signed)
[facility]   Telephone:([phone]) [phone] Fax:([phone]) [phone]   Clinic Follow up Note   Patient Care Team: Loura Back, NP as PCP - General (Nurse Practitioner) Thurmon Fair, MD as PCP - Cardiology (Cardiology) Annamarie Major, CRNP as Nurse Practitioner (Transplant Hepatology) Simonne Come, MD as Consulting Physician (Interventional Radiology) Malachy Mood, MD as Consulting Physician (Hematology and Oncology)  Date of Service:  12/27/2022  CHIEF COMPLAINT: f/u of mixed cholangioadenocarcinoma and hepatocellular carcinoma  CURRENT THERAPY:  Second line gemcitabine and cisplatin  ASSESSMENT:  Connie [name] is a 67 y.o. 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  -Although she had typical image findings of HCC, she underwent liver biopsy since CA 19-9 was also elevated  -liver biopsy showed well to moderately differentiated CK7 positive adenocarcinoma and her EGD was negative for upper GI tract malignancy. Tumor cells were also positive for Glycipan 3, supporting mixed HCC and cholagiocarcinoma. -she started GEMOX (cisplatin is contraindicated due to her chronic kidney disease) and continued durvalumab on 05/10/22, she tolerated treatment well overall  -restaging MRI from 08/01/22 showed disease progression in liver.  -her tumor marker AFP has significantly increased lately, but her CA 19.9 is trending down.  -I have changed her treatment to cisplatin, gemcitabine, atezolizumab and bevacizumab, started on  08/30/2022.  -She tolerated first cycle chemotherapy well, except significant leg edema.  We gave her IV Lasix during the infusion, and she took oral Lasix for 5 days after treatment.  Her leg edema has improved -she has been more fatigued lately, with worsening PS, I have held atezolizumab and bevacizumab after 11/08/22 due to worsening liver function  -Restaging abdominal MRI on November 25, 2022 showed stable disease, she has very high tumor burden in liver -Unfortunately her liver function has been getting worse lately, total bilirubin 5.1 today, she is hypotensive, will stop chemotherapy.  I recommend hospital admission. -Her rapid decline is likely related to her cancer progression in liver, due to her poor performance status and worsening liver function, she would not be a candidate for further treatment.  I discussed hospice care in detail with her, she is open to home hospice after hospital discharge.  Hypotension -Likely secondary to dehydration and cancer progression -Will check urine and blood culture, to rule out infection. -Will start her on IV fluids in the clinic, and admitted her to hospital.  Goals of care, counseling/discussion -We again discussed the incurable nature of her cancer, and the overall poor prognosis, especially due to her rapid decline of liver function and performance status -The patient understands the goal of care is palliative. -I recommend DNR/DNI, she agrees. -I recommend home hospice care after hospital discharge, she agrees.  Severe constipation -She has not had a bowel movement for a week -She previously responded to lactulose, will restart in the hospital.  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  Underlying hepatic cirrhosis suspected.   2.5 cm indeterminate central hypoechoic hepatic lesion. See above comment.   02/22/2021 Imaging   CLINICAL DATA:  Right-sided abdominal pain.   EXAM: ABDOMEN ULTRASOUND COMPLETE  IMPRESSION: 1. Cirrhosis and small ascites. 2. Gallbladder sludge. No sonographic evidence of acute cholecystitis.   02/22/2021 Imaging   CLINICAL DATA:  Abdominal pain.  Concern for diverticulitis.   EXAM: CT ABDOMEN AND PELVIS WITH CONTRAST  IMPRESSION: 1. Acute pancreatitis. No abscess or pseudocyst. 2. Cirrhosis with small ascites. 3. Colonic diverticulosis. No bowel obstruction. Normal appendix. 4. Aortic Atherosclerosis (ICD10-I70.0).   03/02/2021 Tumor Marker   CA 19-9 = 45 (^) AFP = 11 (^)   03/03/2021 Imaging   CLINICAL DATA:  Pancreatitis, worsening abdominal pain.   EXAM: CT ABDOMEN AND PELVIS WITH CONTRAST  IMPRESSION: 1. Multiple suspicious lesions in the right hepatic lobe. Patient has underlying cirrhosis and findings are concerning for multifocal hepatocellular carcinoma. Metastatic disease is also in the differential diagnosis. Recommend MRI of the abdomen with and without contrast. If the patient cannot tolerate a MRI, consider a multiphase liver CT. 2. Increased inflammatory changes in the upper abdomen compatible with pancreatitis and a large developing pseudocyst. This is developing pseudocyst measures up to 14 cm in size. 3. Cirrhosis with evidence of portal hypertension demonstrated by esophageal varices and varices draining into the  IVC. 4. Indeterminate 3 mm pulmonary nodule in the lingula. Management of this pulmonary nodule depends on the etiology of the liver lesions.   03/04/2021 Imaging   EXAM: MRI ABDOMEN WITHOUT AND WITH CONTRAST (INCLUDING MRCP)  IMPRESSION: 1. Multiple enhancing lesions are noted throughout the right lobe of the liver, as detailed above. These have indeterminate imaging characteristics. Given the background of hepatic cirrhosis, the possibility of multifocal infiltrative hepatocellular carcinoma should be considered, although none of these lesions meet definite imaging criteria to establish that diagnosis. Correlation with AFP level is recommended. Other differential considerations include malignant and benign etiologies such as fibrolamellar hepatocellular carcinoma and multifocal focal nodular hyperplasia (FNH). Consideration for repeat abdominal MRI with and without IV gadolinium (Eovist) is also suggested, although biopsy may ultimately be required to establish a tissue diagnosis. 2. 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 3. Trace volume of perihepatic ascites.   09/22/2021 Tumor Marker   CA 19-9 = 190 (^) AFP = 124 (^)   01/19/2022 Tumor Marker   CA 19-9 = 816 (^) AFP = 307 (^)   02/06/2022 Imaging   EXAM: MRI ABDOMEN WITHOUT AND WITH CONTRAST (INCLUDING MRCP)  IMPRESSION: 1. Morphologic features of the liver compatible with cirrhosis. 2. Extensive enhancing liver lesions are identified throughout both lobes of liver which demonstrate marked progression compared with the previous exam. Enhancing lesions demonstrate washout on the delayed images. In the setting of cirrhosis with progressively elevated alpha fetoprotein levels imaging findings are highly worrisome for multifocal hepatocellular carcinoma. 3. Enlarged upper abdominal lymph nodes identified. Cannot exclude underlying nodal metastasis. 4. There are  several  lesions within the central mesentery in the distribution of previous large pseudo cysts. The largest measures 5.0 x 3.1 cm. This is favored to represent sequelae of prior hemorrhagic pancreatitis with pseudocyst formation.   02/23/2022 Initial Diagnosis   Hepatocellular carcinoma (HCC)   03/20/2022 Cancer Staging   Staging form: Liver, AJCC 8th Edition - Clinical stage from 03/20/2022: Stage IVA (cT3, cN1, cM0) - Signed by Malachy Mood, MD on 03/20/2022   03/21/2022 - 04/20/2022 Chemotherapy   Patient is on Treatment Plan : LUNG NSCLC Durvalumab (1500) q28d     04/04/2022 Pathology Results   A. LIVER, RIGHT LOBE, BIOPSY: Well to moderately differentiated CK7 positive adenocarcinoma (see comment)  COMMENT:  Given the cytohistomorphology and this immunohistochemical pattern of cytokeratin 7 positivity, the differential diagnosis would include a pancreaticobiliary or upper gastrointestinal intestinal tract primary or possibly a lung primary (not favored).    05/11/2022 - 05/11/2022 Chemotherapy   Patient is on Treatment Plan : BLADDER Durvalumab (10) q14d     Cholangiocarcinoma of liver (HCC)  04/23/2022 Initial Diagnosis   Cholangiocarcinoma of liver (HCC)   05/11/2022 - 05/11/2022 Chemotherapy   Patient is on Treatment Plan : BILIARY TRACT Cisplatin + Gemcitabine D1,8 q21d     05/11/2022 - 07/27/2022 Chemotherapy   Patient is on Treatment Plan : PANCREAS GemOx q14d     08/31/2022 -  Chemotherapy   Patient is on Treatment Plan : BILIARY TRACT Cisplatin + Gemcitabine D1,8 + Durvalumab (1500) D1 q21d / Durvalumab (1500) q28d        INTERVAL HISTORY:  Connie [name] is here for a follow up of her liver cancer.  She is accompanied by her husband.  They came back from cruise yesterday.  She has been very fatigued, has low appetite, was overall able to enjoy some of her trip, but has been feeling bad lately.  She complains of diffuse bodyaches, and has not had a bowel movement for the  past week.  No nausea vomiting, no fever or chills.   All other systems were reviewed with the patient and are negative.  MEDICAL HISTORY:  Past Medical History:  Diagnosis Date   Alcohol abuse    Alcoholic cirrhosis of liver (HCC)    Atrial fibrillation with RVR (HCC) 08/02/2019   Cataract    Chronic kidney disease    Helicobacter pylori gastritis 08/07/2019   Hepatitis C antibody positive in blood - negative RNA    Hypertension    liver ca 01/2022   bile duct ca 05/2022   Pancreatic pseudocyst 03/04/2021    SURGICAL HISTORY: Past Surgical History:  Procedure Laterality Date   BIOPSY  08/03/2019   Procedure: BIOPSY;  Surgeon: Iva Boop, MD;  Location: [facility] ENDOSCOPY;  Service: Endoscopy;;   ESOPHAGOGASTRODUODENOSCOPY (EGD) WITH PROPOFOL N/A 08/03/2019   Procedure: ESOPHAGOGASTRODUODENOSCOPY (EGD) WITH PROPOFOL;  Surgeon: Iva Boop, MD;  Location: [facility] ENDOSCOPY;  Service: Endoscopy;  Laterality: N/A;   IR IMAGING GUIDED PORT INSERTION  05/29/2022   IR RADIOLOGIST EVAL & MGMT  02/14/2022   IR RADIOLOGIST EVAL & MGMT  03/14/2022    I have reviewed the social history and family history with the patient and they are unchanged from previous note.  ALLERGIES:  has No Known Allergies.  MEDICATIONS:  Current Outpatient Medications  Medication Sig Dispense Refill   acetaminophen (TYLENOL) 500 MG tablet Take 1,000 mg by mouth every 6 (six) hours as needed for mild pain.     amLODipine (NORVASC) 10 MG tablet Take 10  mg by mouth daily.     furosemide (LASIX) 20 MG tablet Take 1 tablet (20 mg total) by mouth daily as needed as directed for leg swelling for no more than 5 days. 20 tablet 1   gabapentin (NEURONTIN) 100 MG capsule TAKE 1 CAPSULE BY MOUTH 3 TIMES  DAILY (Patient taking differently: Take 100 mg by mouth 2 (two) times daily.) 270 capsule 3   lactulose (CHRONULAC) 10 GM/15ML solution Take 30 mLs (20 g total) by mouth 2 (two) times daily as needed for mild constipation  (titrate dose to have bowel movement 2-3 times a day). 473 mL 1   latanoprost (XALATAN) 0.005 % ophthalmic solution Place 1 drop into both eyes at bedtime.     lidocaine-prilocaine (EMLA) cream Apply 1 Application topically as needed. Apply 5ml to Port-a-Cath site 45 mins to 1 hr before port-a-cath access. 30 g 0   lipase/protease/amylase (CREON) 12000-38000 units CPEP capsule Take 1 capsule (12,000 Units total) by mouth 3 (three) times daily with meals. 270 capsule 2   magnesium oxide (MAG-OX) 400 (240 Mg) MG tablet Take 1 tablet (400 mg total) by mouth 2 (two) times daily. 60 tablet 1   megestrol (MEGACE ES) 625 MG/5ML suspension Take 5 mLs (625 mg total) by mouth daily. 150 mL 1   ondansetron (ZOFRAN) 8 MG tablet Take 1 tablet (8 mg total) by mouth every 8 (eight) hours as needed for nausea or vomiting. 60 tablet 1   oxyCODONE (OXY IR/ROXICODONE) 5 MG immediate release tablet Take 1 tablet (5 mg total) by mouth every 6 (six) hours as needed for severe pain. 30 tablet 0   pantoprazole (PROTONIX) 40 MG tablet TAKE 1 TABLET BY MOUTH DAILY 100 tablet 2   polyethylene glycol (MIRALAX / GLYCOLAX) 17 g packet Take 17 g by mouth 2 (two) times daily. (Patient taking differently: Take 17 g by mouth daily as needed for moderate constipation.) 14 each 0   potassium chloride SA (KLOR-CON M) 20 MEQ tablet Take 1 tablet (20 mEq total) by mouth 2 (two) times daily.  Take only when taking furosemide (lasix) as directed. 20 tablet 1   prochlorperazine (COMPAZINE) 10 MG tablet Take 1 tablet (10 mg total) by mouth every 6 (six) hours as needed for nausea or vomiting. 30 tablet 2   Current Facility-Administered Medications  Medication Dose Route Frequency Provider Last Rate Last Admin   0.9 %  sodium chloride infusion   Intravenous Continuous Malachy Mood, MD       Facility-Administered Medications Ordered in Other Visits  Medication Dose Route Frequency Provider Last Rate Last Admin   0.9 %  sodium chloride infusion    Intravenous Continuous Malachy Mood, MD        PHYSICAL EXAMINATION: ECOG PERFORMANCE STATUS: 3 - Symptomatic, >50% confined to bed  Vitals:   12/27/22 0913  BP: (!) 78/55  Pulse: (!) 55  Resp: 15  SpO2: 99%   Wt Readings from Last 3 Encounters:  12/27/22 241 lb 12.8 oz (109.7 kg)  11/29/22 229 lb 8 oz (104.1 kg)  11/16/22 226 lb 3.2 oz (102.6 kg)     GENERAL:alert, but drowsy SKIN: skin color, texture, turgor are normal, no rashes or significant lesions EYES: normal, Conjunctiva are pink and non-injected, sclera icteric  NECK: supple, thyroid normal size, non-tender, without nodularity LYMPH:  no palpable lymphadenopathy in the cervical, axillary  LUNGS: clear to auscultation and percussion with normal breathing effort HEART: regular rate & rhythm and no murmurs and no  lower extremity edema ABDOMEN:abdomen soft, non-tender and normal bowel sounds Musculoskeletal:no cyanosis of digits and no clubbing  NEURO: alert & oriented x 3 with fluent speech, no focal motor/sensory deficits  LABORATORY DATA:  I have reviewed the data as listed    Latest Ref Rng & Units 12/27/2022    8:28 AM 12/01/2022    8:58 PM 11/29/2022    9:07 AM  CBC  WBC 4.0 - 10.5 K/uL 7.0  6.6  5.7   Hemoglobin 12.0 - 15.0 g/dL 9.0  9.7  9.8   Hematocrit 36.0 - 46.0 % 26.6  30.6  28.1   Platelets 150 - 400 K/uL 80  130  146         Latest Ref Rng & Units 12/27/2022    8:28 AM 12/01/2022    8:58 PM 11/29/2022    9:07 AM  CMP  Glucose 70 - 99 mg/dL 86  86  660   BUN 8 - 23 mg/dL 15  13  5    Creatinine 0.44 - 1.00 mg/dL 6.30  1.60  1.09   Sodium 135 - 145 mmol/L 137  132  138   Potassium 3.5 - 5.1 mmol/L 4.4  4.3  3.8   Chloride 98 - 111 mmol/L 105  103  108   CO2 22 - 32 mmol/L 25  14  27    Calcium 8.9 - 10.3 mg/dL 8.4  8.2  8.8   Total Protein 6.5 - 8.1 g/dL 5.4  5.9  6.1   Total Bilirubin 0.3 - 1.2 mg/dL 5.1  4.4  3.4   Alkaline Phos 38 - 126 U/L 164  125  156   AST 15 - 41 U/L 132  114  145   ALT 0  - 44 U/L 50  59  58       RADIOGRAPHIC STUDIES: I have personally reviewed the radiological images as listed and agreed with the findings in the report. No results found.    No orders of the defined types were placed in this encounter.  All questions were answered. The patient knows to call the clinic with any problems, questions or concerns. No barriers to learning was detected. The total time spent in the appointment was 40 minutes.     Malachy Mood, MD 12/27/2022

## 2022-12-27 NOTE — H&P (Signed)
History and Physical  Connie [name] ZOX:[id number] DOB: [birth year] DOA: 12/27/2022  PCP: Loura Back, NP   Chief Complaint: Weakness, hypotension  HPI: Connie [name] is a 67 y.o. 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  Presented for follow-up to cancer clinic today, was found to be significantly hypotensive, weak with poor functional status.  Hospitalist was contacted for direct admission by her oncologist Dr. Mosetta Putt.  They had a long discussion about the patient's disease today, patient has elected to be DNR/DNI, and understands that she is not a candidate for any further treatment.  Denies any fevers, chills, cough.  Has been constipated for about the last week.  He was given IV fluids in the cancer clinic, blood pressure has now improved.  Lab work as detailed below shows stable anemia, worsening thrombocytopenia, continued acute kidney injury.  Review of Systems: Please see HPI for pertinent positives and negatives. A complete 10 system review of systems are otherwise negative.  Past Medical History:  Diagnosis Date   Alcohol abuse    Alcoholic cirrhosis of liver (HCC)    Atrial fibrillation with RVR (HCC) 08/02/2019   Cataract    Chronic kidney disease    Helicobacter pylori gastritis 08/07/2019   Hepatitis C antibody positive in blood - negative RNA    Hypertension    liver ca 01/2022   bile duct ca 05/2022   Pancreatic pseudocyst 03/04/2021   Past Surgical History:  Procedure Laterality Date   BIOPSY  08/03/2019   Procedure: BIOPSY;  Surgeon: Iva Boop, MD;  Location: [facility] ENDOSCOPY;  Service: Endoscopy;;   ESOPHAGOGASTRODUODENOSCOPY (EGD)  WITH PROPOFOL N/A 08/03/2019   Procedure: ESOPHAGOGASTRODUODENOSCOPY (EGD) WITH PROPOFOL;  Surgeon: Iva Boop, MD;  Location: [facility] ENDOSCOPY;  Service: Endoscopy;  Laterality: N/A;   IR IMAGING GUIDED PORT INSERTION  05/29/2022   IR RADIOLOGIST EVAL & MGMT  02/14/2022   IR RADIOLOGIST EVAL & MGMT  03/14/2022    Social History:  reports that she has been smoking cigarettes. She has a 25 pack-year smoking history. She does not have any smokeless tobacco history on file. She reports current alcohol use of about 1.0 standard drink of alcohol per week. She reports that she does not currently use drugs.   No Known Allergies  Family History  Problem Relation Age of Onset   Lung cancer Mother    Colon cancer Neg Hx    Esophageal cancer Neg Hx    Rectal cancer Neg Hx    Stomach cancer Neg Hx      Prior to Admission medications   Medication Sig Start Date End Date Taking? Authorizing Provider  acetaminophen (TYLENOL) 500 MG tablet Take 1,000 mg by mouth every 6 (six) hours as needed for mild pain.    [provider]  amLODipine (NORVASC) 10 MG tablet Take 10 mg by mouth daily.    [provider]  furosemide (LASIX) 20 MG tablet Take 1 tablet (20 mg total) by mouth daily as needed as directed for leg swelling for no more than 5 days. 11/29/22   Pollyann Samples, NP  gabapentin (NEURONTIN) 100 MG capsule TAKE 1 CAPSULE BY MOUTH 3 TIMES  DAILY Patient taking differently: Take 100 mg by mouth 2 (two)  times daily. 10/24/22   Pollyann Samples, NP  lactulose (CHRONULAC) 10 GM/15ML solution Take 30 mLs (20 g total) by mouth 2 (two) times daily as needed for mild constipation (titrate dose to have bowel movement 2-3 times a day). 11/29/22   Pollyann Samples, NP  latanoprost (XALATAN) 0.005 % ophthalmic solution Place 1 drop into both eyes at bedtime.    [provider]  lidocaine-prilocaine (EMLA) cream Apply 1 Application topically as needed. Apply 5ml to Port-a-Cath site 45 mins to  1 hr before port-a-cath access. 09/27/22   Malachy Mood, MD  lipase/protease/amylase (CREON) 12000-38000 units CPEP capsule Take 1 capsule (12,000 Units total) by mouth 3 (three) times daily with meals. 05/31/22   Malachy Mood, MD  magnesium oxide (MAG-OX) 400 (240 Mg) MG tablet Take 1 tablet (400 mg total) by mouth 2 (two) times daily. 11/29/22   Pollyann Samples, NP  megestrol (MEGACE ES) 625 MG/5ML suspension Take 5 mLs (625 mg total) by mouth daily. 11/29/22   Pollyann Samples, NP  ondansetron (ZOFRAN) 8 MG tablet Take 1 tablet (8 mg total) by mouth every 8 (eight) hours as needed for nausea or vomiting. 09/07/22   Malachy Mood, MD  oxyCODONE (OXY IR/ROXICODONE) 5 MG immediate release tablet Take 1 tablet (5 mg total) by mouth every 6 (six) hours as needed for severe pain. 10/26/22   Artis Delay, MD  pantoprazole (PROTONIX) 40 MG tablet TAKE 1 TABLET BY MOUTH DAILY 10/12/22   Malachy Mood, MD  polyethylene glycol (MIRALAX / GLYCOLAX) 17 g packet Take 17 g by mouth 2 (two) times daily. Patient taking differently: Take 17 g by mouth daily as needed for moderate constipation. 03/06/21   Rolly Salter, MD  potassium chloride SA (KLOR-CON M) 20 MEQ tablet Take 1 tablet (20 mEq total) by mouth 2 (two) times daily.  Take only when taking furosemide (lasix) as directed. 11/29/22   Pollyann Samples, NP  prochlorperazine (COMPAZINE) 10 MG tablet Take 1 tablet (10 mg total) by mouth every 6 (six) hours as needed for nausea or vomiting. 11/29/22   Pollyann Samples, NP    Physical Exam: BP (!) 111/57 (BP Location: Left Arm)   Pulse 64   Temp 97.7 F (36.5 C) (Axillary)   Resp 14   Ht 5\' 5"  (1.651 m)   Wt 110.1 kg   SpO2 98%   BMI 40.39 kg/m   General:  Alert, oriented, calm, in no acute distress, clinically looks dehydrated  Eyes: EOMI, clear conjuctivae, scleral icterus Neck: supple, no masses, trachea mildline  Cardiovascular: RRR, no murmurs or rubs, no peripheral edema  Respiratory: clear to auscultation bilaterally,  no wheezes, no crackles  Abdomen: soft, nontender, nondistended, normal bowel tones heard  Skin: dry, no rashes, right anterior chest port site looks benign Musculoskeletal: no joint effusions, normal range of motion  Psychiatric: appropriate affect, normal speech  Neurologic: extraocular muscles intact, clear speech, moving all extremities with intact sensorium          Labs on Admission:  Basic Metabolic Panel: Recent Labs  Lab 12/27/22 0828  NA 137  K 4.4  CL 105  CO2 25  GLUCOSE 86  BUN 15  CREATININE 1.40*  CALCIUM 8.4*  MG 1.7   Liver Function Tests: Recent Labs  Lab 12/27/22 0828  AST 132*  ALT 50*  ALKPHOS 164*  BILITOT 5.1*  PROT 5.4*  ALBUMIN 1.5*   No results for input(s): "LIPASE", "AMYLASE" in the last 168  hours. No results for input(s): "AMMONIA" in the last 168 hours. CBC: Recent Labs  Lab 12/27/22 0828  WBC 7.0  NEUTROABS 3.2  HGB 9.0*  HCT 26.6*  MCV 117.2*  PLT 80*   Cardiac Enzymes: No results for input(s): "CKTOTAL", "CKMB", "CKMBINDEX", "TROPONINI" in the last 168 hours.  BNP (last 3 results) No results for input(s): "BNP" in the last 8760 hours.  ProBNP (last 3 results) No results for input(s): "PROBNP" in the last 8760 hours.  CBG: Recent Labs  Lab 12/27/22 1420  GLUCAP 57*    Radiological Exams on Admission: No results found.  Assessment/Plan Connie [name] is a 67 y.o. 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  No clear evidence of acute infection or other abnormality to suggest an etiology of her worsening weakness and dehydration, unfortunately this is most likely related to cancer  progression.  Weakness, dehydration-most likely related to cancer progression.  However we will work her up to look for other potential and reversible causes. -Inpatient admission -Pain and nausea control as needed -Gentle IV hydration -Check blood culture, urinalysis, chest x-ray  Hepatocellular and bile duct carcinoma-stage IVa, patient has had continued functional decline and suspected progression of her malignancy despite palliative chemotherapy -Dr. Mosetta Putt added to treatment team, will follow -Patient has been transitioned to DNR status -Palliative and TOC consults, to assist in symptom management, and anticipated home hospice  Hypertension-hold home antihypertensives due to dehydration and hypotension  Acute renal failure-in the setting of normal renal function, likely due to dehydration -Avoid nephrotoxins -Avoid hypotension -Hydrate gently with normal saline -Recheck renal function in the morning  Chronic anemia-likely due to her malignancy, and chemotherapy, this is stable -Follow hemoglobin with daily labs  Thrombocytopenia-this is worsening, also likely due to progressive liver disease from her malignancy  Abnormal LFTs-also worsening due to her underlying malignancy  GERD-Protonix  DVT prophylaxis: SCDs only     Code Status: Limited: Do not attempt resuscitation (DNR) -DNR-LIMITED -Do Not Intubate/DNI   Consults called: Palliative Care  Admission status: The appropriate patient status for this patient is INPATIENT. Inpatient status is judged to be reasonable and necessary in order to provide the required intensity of service to ensure the patient's safety. The patient's presenting symptoms, physical exam findings, and initial radiographic and laboratory data in the context of their chronic comorbidities is felt to place them at high risk for further clinical deterioration. Furthermore, it is not anticipated that the patient will be medically stable for discharge from the  hospital within 2 midnights of admission.    I certify that at the point of admission it is my clinical judgment that the patient will require inpatient hospital care spanning beyond 2 midnights from the point of admission due to high intensity of service, high risk for further deterioration and high frequency of surveillance required  Time spent: 49 minutes  Connie [name] Sharlette Dense MD [facility] Pager [phone]  If 7PM-7AM, please contact night-coverage [email or web address] Password [facility]  12/27/2022, 2:33 PM

## 2022-12-27 NOTE — Assessment & Plan Note (Signed)
-  We again discussed the incurable nature of her cancer, and the overall poor prognosis, especially due to her rapid decline of liver function and performance status -The patient understands the goal of care is palliative. -I recommend DNR/DNI, she agrees. -I recommend home hospice care after hospital discharge, she agrees.

## 2022-12-27 NOTE — Assessment & Plan Note (Addendum)
Stage IVA (cT3, cN1, cM0)  with diffuse liver mets, unresectable  -Liver cirrhosis and a 2.5cm mass was found in 07/2019 but she did not follow up -due to rising AFP, she had MRI on 02/06/2022 which showed extensive liver lesions (largest 13cm) throughout both lobes with marked progression from prior MRI; enlarged upper abdominal lymph nodes, no distant mets -she started Durvaluamb every 4 weeks, and Tremelimumab 300mg  once on day 1 of cycle 1, on 03/21/2022.  -Although she had typical image findings of HCC, she underwent liver biopsy since CA 19-9 was also elevated  -liver biopsy showed well to moderately differentiated CK7 positive adenocarcinoma and her EGD was negative for upper GI tract malignancy. Tumor cells were also positive for Glycipan 3, supporting mixed HCC and cholagiocarcinoma. -she started GEMOX (cisplatin is contraindicated due to her chronic kidney disease) and continued durvalumab on 05/10/22, she tolerated treatment well overall  -restaging MRI from 08/01/22 showed disease progression in liver.  -her tumor marker AFP has significantly increased lately, but her CA 19.9 is trending down.  -I have changed her treatment to cisplatin, gemcitabine, atezolizumab and bevacizumab, started on 08/30/2022.  -She tolerated first cycle chemotherapy well, except significant leg edema.  We gave her IV Lasix during the infusion, and she took oral Lasix for 5 days after treatment.  Her leg edema has improved -she has been more fatigued lately, with worsening PS, I have held atezolizumab and bevacizumab after 11/08/22 due to worsening liver function  -Restaging abdominal MRI on November 25, 2022 showed stable disease, she has very high tumor burden in liver -Unfortunately her liver function has been getting worse lately, total bilirubin 5.1 today, she is hypotensive, will stop chemotherapy.  I recommend hospital admission. -Her rapid decline is likely related to her cancer progression in liver, due to her poor  performance status and worsening liver function, she would not be a candidate for further treatment.  I discussed hospice care in detail with her, she is open to home hospice after hospital discharge.

## 2022-12-27 NOTE — Plan of Care (Signed)

## 2022-12-27 NOTE — Progress Notes (Signed)
Report given to 2W floor nurse Rudene Anda, RN.  Pt's port accessed.  Pt being admitted for Liver Failure w/Tbili 5.1.  Pt jaundice and sometimes confused.  Pt is hypotensive VS documented in chart by infusion nurse.  Stated pt's spouse is here w/in hospital and will come to the floor shortly.  Stated pt got 1.3L NS per infusion nurse.

## 2022-12-28 DIAGNOSIS — N179 Acute kidney failure, unspecified: Secondary | ICD-10-CM | POA: Diagnosis not present

## 2022-12-28 DIAGNOSIS — C220 Liver cell carcinoma: Secondary | ICD-10-CM

## 2022-12-28 LAB — COMPREHENSIVE METABOLIC PANEL
ALT: 59 U/L — ABNORMAL HIGH (ref 0–44)
AST: 150 U/L — ABNORMAL HIGH (ref 15–41)
Albumin: <1.5 g/dL — ABNORMAL LOW (ref 3.5–5.0)
Alkaline Phosphatase: 160 U/L — ABNORMAL HIGH (ref 38–126)
Anion gap: 7 (ref 5–15)
BUN: 13 mg/dL (ref 8–23)
CO2: 22 mmol/L (ref 22–32)
Calcium: 8.2 mg/dL — ABNORMAL LOW (ref 8.9–10.3)
Chloride: 101 mmol/L (ref 98–111)
Creatinine, Ser: 1.07 mg/dL — ABNORMAL HIGH (ref 0.44–1.00)
GFR, Estimated: 57 mL/min — ABNORMAL LOW (ref >60)
Glucose, Bld: 120 mg/dL — ABNORMAL HIGH (ref 70–99)
Potassium: 3.7 mmol/L (ref 3.5–5.1)
Sodium: 130 mmol/L — ABNORMAL LOW (ref 135–145)
Total Bilirubin: 7.1 mg/dL — ABNORMAL HIGH (ref 0.3–1.2)
Total Protein: 6.2 g/dL — ABNORMAL LOW (ref 6.5–8.1)

## 2022-12-28 LAB — ETHANOL: Alcohol, Ethyl (B): <10 mg/dL (ref <10)

## 2022-12-28 LAB — GLUCOSE, CAPILLARY
Glucose-Capillary: 104 mg/dL — ABNORMAL HIGH (ref 70–99)
Glucose-Capillary: 75 mg/dL (ref 70–99)
Glucose-Capillary: 52 mg/dL — ABNORMAL LOW (ref 70–99)
Glucose-Capillary: 123 mg/dL — ABNORMAL HIGH (ref 70–99)
Glucose-Capillary: 84 mg/dL (ref 70–99)
Glucose-Capillary: 65 mg/dL — ABNORMAL LOW (ref 70–99)
Glucose-Capillary: 60 mg/dL — ABNORMAL LOW (ref 70–99)
Glucose-Capillary: 63 mg/dL — ABNORMAL LOW (ref 70–99)
Glucose-Capillary: 65 mg/dL — ABNORMAL LOW (ref 70–99)
Glucose-Capillary: 105 mg/dL — ABNORMAL HIGH (ref 70–99)

## 2022-12-28 LAB — CBC
HCT: 28.9 % — ABNORMAL LOW (ref 36.0–46.0)
Hemoglobin: 9.8 g/dL — ABNORMAL LOW (ref 12.0–15.0)
MCH: 39.8 pg — ABNORMAL HIGH (ref 26.0–34.0)
MCHC: 33.9 g/dL (ref 30.0–36.0)
MCV: 117.5 fL — ABNORMAL HIGH (ref 80.0–100.0)
Platelets: 93 10*3/uL — ABNORMAL LOW (ref 150–400)
RBC: 2.46 MIL/uL — ABNORMAL LOW (ref 3.87–5.11)
RDW: 24 % — ABNORMAL HIGH (ref 11.5–15.5)
WBC: 11.2 10*3/uL — ABNORMAL HIGH (ref 4.0–10.5)
nRBC: 0.2 % (ref 0.0–0.2)

## 2022-12-28 LAB — RAPID URINE DRUG SCREEN, HOSP PERFORMED
Amphetamines: NOT DETECTED
Barbiturates: NOT DETECTED
Benzodiazepines: NOT DETECTED
Cocaine: NOT DETECTED
Opiates: NOT DETECTED
Tetrahydrocannabinol: NOT DETECTED

## 2022-12-28 LAB — GLUCOSE, RANDOM: Glucose, Bld: 116 mg/dL — ABNORMAL HIGH (ref 70–99)

## 2022-12-28 LAB — PHOSPHORUS: Phosphorus: 1.6 mg/dL — ABNORMAL LOW (ref 2.5–4.6)

## 2022-12-28 LAB — CANCER ANTIGEN 19-9: CA 19-9: 280 U/mL — ABNORMAL HIGH (ref 0–35)

## 2022-12-28 LAB — AFP TUMOR MARKER: AFP, Serum, Tumor Marker: 647 ng/mL — ABNORMAL HIGH (ref 0.0–9.2)

## 2022-12-28 LAB — MAGNESIUM: Magnesium: 1.7 mg/dL (ref 1.7–2.4)

## 2022-12-28 MED ORDER — DEXTROSE 50 % IV SOLN: 25.0000 g | INTRAVENOUS | Status: AC

## 2022-12-28 MED ORDER — SODIUM CHLORIDE 0.9% FLUSH: 10.0000 mL | INTRAVENOUS | Status: DC | PRN

## 2022-12-28 MED ORDER — ADULT MULTIVITAMIN W/MINERALS CH: 1.0000 | ORAL_TABLET | Freq: Every day | ORAL | Status: DC

## 2022-12-28 MED ORDER — ORAL CARE MOUTH RINSE
15.0000 mL | OROMUCOSAL | Status: DC | PRN
  Administered 2022-12-28: 15 mL via OROMUCOSAL

## 2022-12-28 MED ORDER — LACTATED RINGERS IV SOLN: INTRAVENOUS | Status: DC

## 2022-12-28 MED ORDER — THIAMINE HCL 100 MG/ML IJ SOLN: 100.0000 mg | Freq: Every day | INTRAMUSCULAR | Status: DC

## 2022-12-28 MED ORDER — DEXTROSE 50 % IV SOLN
INTRAVENOUS | Status: AC
  Administered 2022-12-28: 25 g via INTRAVENOUS
  Filled 2022-12-28: qty 50

## 2022-12-28 MED ORDER — DEXTROSE 50 % IV SOLN
25.0000 g | Freq: Four times a day (QID) | INTRAVENOUS | Status: DC | PRN
  Administered 2022-12-28: 25 g via INTRAVENOUS
  Filled 2022-12-28: qty 50

## 2022-12-28 MED ORDER — LORAZEPAM 1 MG PO TABS: 1.0000 mg | ORAL_TABLET | Freq: Four times a day (QID) | ORAL | Status: DC | PRN

## 2022-12-28 MED ORDER — LORAZEPAM 2 MG/ML IJ SOLN
0.5000 mg | Freq: Once | INTRAMUSCULAR | Status: AC
  Administered 2022-12-28: 0.5 mg via INTRAVENOUS
  Filled 2022-12-28 (×2): qty 1

## 2022-12-28 MED ORDER — FOLIC ACID 1 MG PO TABS: 1.0000 mg | ORAL_TABLET | Freq: Every day | ORAL | Status: DC

## 2022-12-28 MED ORDER — THIAMINE MONONITRATE 100 MG PO TABS: 100.0000 mg | ORAL_TABLET | Freq: Every day | ORAL | Status: DC

## 2022-12-28 MED ORDER — ORAL CARE MOUTH RINSE
15.0000 mL | OROMUCOSAL | Status: DC
  Administered 2022-12-28 – 2022-12-29 (×2): 15 mL via OROMUCOSAL

## 2022-12-28 MED ORDER — DEXTROSE 50 % IV SOLN
50.0000 mL | Freq: Once | INTRAVENOUS | Status: AC
  Administered 2022-12-28: 50 mL via INTRAVENOUS
  Filled 2022-12-28: qty 50

## 2022-12-28 MED ORDER — LORAZEPAM 1 MG PO TABS
0.0000 mg | ORAL_TABLET | ORAL | Status: DC
  Administered 2022-12-28: 1 mg via ORAL
  Filled 2022-12-28: qty 1

## 2022-12-28 MED ORDER — DEXTROSE 5 % IV SOLN: INTRAVENOUS | Status: AC

## 2022-12-28 NOTE — Consult Note (Signed)
  Daily Progress Note   Patient Name: Connie [name]       Date: 12/28/2022 DOB: [birth year]  Age: 67 y.o. MRN#: [id number] Attending Physician: Lewie Chamber, MD Primary Care Physician: Loura Back, NP Admit Date: 12/27/2022 Length of Stay: 1 day  Discussed care with primary hospitalist today. Hospitalist has already discussed goals for medical moving forward with patient and husband. Currently planning for patient to go home with hospice. TOC to assist with hospice referral. As goals for medical care currently determined, PMT consult will be canceled. Please reach out if PMT can be of assistance in the future.   Alvester Morin, DO Palliative Care Provider PMT # [phone]

## 2022-12-28 NOTE — Progress Notes (Signed)
Progress Note    Connie [name]   XBJ:[id number]  DOB: [birth year]  DOA: 12/27/2022     1 PCP: Connie Back, NP  Initial CC: cancer progression  Hospital Course: 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  She underwent lab work and was found to have worsening liver function with uptrending total bili.  She has also had decreased performance status and cancer progression.  She was recommended for transitioning to hospice and treatments have been discontinued. She was referred to the hospital for assistance with transitioning to hospice at home.  Interval History:  Some agitation overnight and she received ativan. Very somnolent and difficult to arouse this morning but later on was seen a little more awake although unable to interact much still. Husband also bedside and says she appears comfortable though but they haven't been able to communicate much.  He still prefers home with hospice and is comfortable caring for her at home at this time.  He declines [facility]. Does still need DME ordered for home use prior to discharge as well.  Assessment and Plan: * 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) - stage IV with diffuse and worsening liver mets; follows with Dr. Mosetta [name] - worsening functional status and progression of cancer now that oncology has discontinued treatment and patient recommended for transition to hospice at home - [facility] consulted for assistance with arranging home hospice and DME - palliative consult cancelled after discussion with husband on 8/29; goals are clear and he is comfortable with transitioning to home hospice (declines [facility] at this time) - continue comfort care as goal; meds de-escalated   Hypotension-resolved as of 12/28/2022 - multifactorial from volume depletion/3rd  spacing/hypoalbuminemai and cancer progression; low suspicion for infection - BP stable at this time; d/c IVF  AKI (acute kidney injury) (HCC) -Baseline creatinine around 1 -Creatinine 1.41 on admission; etiology suspected volume depletion/prerenal -Improved with fluids - Given severe hypoalbuminemia, discontinue fluids at this time to prevent further third spacing -Continue pleasure feeding and comfort care   Hypertension-hold home antihypertensives due to dehydration and hypotension   Chronic anemia-likely due to her malignancy, and chemotherapy, this is stable -now pursuing comfort care   Thrombocytopenia-this is worsening, also likely due to progressive liver disease from her malignancy   Abnormal LFTs-also worsening due to her underlying malignancy   GERD-Protonix   Old records reviewed in assessment of this patient  Antimicrobials:   DVT prophylaxis:  SCDs Start: 12/27/22 1430   Code Status:   Code Status: Do not attempt resuscitation (DNR) - Comfort care  Mobility Assessment (Last 72 Hours)     Mobility Assessment   No documentation.           Barriers to discharge: none Disposition Plan:  Home with hospice  Status is: Inpt  Objective: Blood pressure 114/61, pulse 89, temperature 97.9 F (36.6 C), temperature source Axillary, resp. rate 10, height 5\' 5"  (1.651 m), weight 110.1 kg, SpO2 96%.  Examination:  Physical Exam Constitutional:      Comments: Arouses to sternal rub; minimally responsive   HENT:     Head: Normocephalic and atraumatic.     Mouth/Throat:     Mouth: Mucous membranes are dry.  Eyes:     General: Scleral icterus present.     Extraocular Movements: Extraocular movements intact.  Cardiovascular:  Rate and Rhythm: Normal rate and regular rhythm.  Pulmonary:     Effort: Pulmonary effort is normal. No respiratory distress.     Breath sounds: Normal breath sounds. No wheezing.  Abdominal:     General: Bowel sounds are normal.  There is no distension.     Palpations: Abdomen is soft.     Tenderness: There is no abdominal tenderness.  Musculoskeletal:     Cervical [name]: Normal range of motion and neck supple.  Skin:    General: Skin is warm and dry.  Neurological:     Comments: Unable to arouse enough to assess      Consultants:    Procedures:    Data Reviewed: Results for orders placed or performed during the hospital encounter of 12/27/22 (from the past 24 hour(s))  Glucose, capillary     Status: Abnormal   Collection Time: 12/27/22  2:20 PM  Result Value Ref Range   Glucose-Capillary 57 (L) 70 - 99 mg/dL  Glucose, capillary     Status: Abnormal   Collection Time: 12/27/22  2:38 PM  Result Value Ref Range   Glucose-Capillary 63 (L) 70 - 99 mg/dL  Glucose, capillary     Status: Abnormal   Collection Time: 12/27/22  3:28 PM  Result Value Ref Range   Glucose-Capillary 133 (H) 70 - 99 mg/dL  Culture, blood (Routine X 2) w Reflex to ID Panel     Status: None (Preliminary result)   Collection Time: 12/27/22  3:43 PM   Specimen: BLOOD LEFT ARM  Result Value Ref Range   Specimen Description      BLOOD LEFT ARM Performed at [facility], [address] [place], [place] [address]    Special Requests      BOTTLES DRAWN AEROBIC AND ANAEROBIC Blood Culture adequate volume Performed at [facility], [address], [place], [place] [address]    Culture      NO GROWTH < 24 HOURS Performed at [facility], [address], [place], [place] [address]    Report Status PENDING   MRSA Next Gen by PCR, Nasal     Status: None   Collection Time: 12/27/22  3:44 PM   Specimen: Nasal Mucosa; Nasal Swab  Result Value Ref Range   MRSA by PCR Next Gen NOT DETECTED NOT DETECTED  HIV Antibody (routine testing w rflx)     Status: None   Collection Time: 12/27/22  3:52 PM  Result Value Ref Range   HIV Screen 4th Generation wRfx Non Reactive Non Reactive  Glucose, capillary      Status: Abnormal   Collection Time: 12/27/22  6:27 PM  Result Value Ref Range   Glucose-Capillary 132 (H) 70 - 99 mg/dL  Urinalysis, Routine w reflex microscopic -Urine, Clean Catch     Status: Abnormal   Collection Time: 12/27/22  6:46 PM  Result Value Ref Range   Color, Urine AMBER (A) YELLOW   APPearance CLOUDY (A) CLEAR   Specific Gravity, Urine 1.018 1.005 - 1.030   pH 5.0 5.0 - 8.0   Glucose, UA NEGATIVE NEGATIVE mg/dL   Hgb urine dipstick NEGATIVE NEGATIVE   Bilirubin Urine SMALL (A) NEGATIVE   Ketones, ur NEGATIVE NEGATIVE mg/dL   Protein, ur NEGATIVE NEGATIVE mg/dL   Nitrite NEGATIVE NEGATIVE   Leukocytes,Ua NEGATIVE NEGATIVE  Rapid urine drug screen (hospital performed)     Status: None   Collection Time: 12/27/22  6:46 PM  Result Value Ref  Range   Opiates NONE DETECTED NONE DETECTED   Cocaine NONE DETECTED NONE DETECTED   Benzodiazepines NONE DETECTED NONE DETECTED   Amphetamines NONE DETECTED NONE DETECTED   Tetrahydrocannabinol NONE DETECTED NONE DETECTED   Barbiturates NONE DETECTED NONE DETECTED  Culture, blood (Routine X 2) w Reflex to ID Panel     Status: None (Preliminary result)   Collection Time: 12/27/22  8:45 PM   Specimen: BLOOD LEFT ARM  Result Value Ref Range   Specimen Description      BLOOD LEFT ARM Performed at [facility], [address] [place], [place] [address]    Special Requests      BOTTLES DRAWN AEROBIC AND ANAEROBIC Blood Culture adequate volume Performed at [facility], [address] [place], [place] [address]    Culture      NO GROWTH < 12 HOURS Performed at [facility], [address] [place], [place] [address]    Report Status PENDING   Comprehensive metabolic panel     Status: Abnormal   Collection Time: 12/28/22  3:43 AM  Result Value Ref Range   Sodium 130 (L) 135 - 145 mmol/L   Potassium 3.7 3.5 - 5.1 mmol/L   Chloride 101 98 - 111 mmol/L   CO2 22 22 - 32 mmol/L   Glucose, Bld 120 (H)  70 - 99 mg/dL   BUN 13 8 - 23 mg/dL   Creatinine, Ser 9.14 (H) 0.44 - 1.00 mg/dL   Calcium 8.2 (L) 8.9 - 10.3 mg/dL   Total Protein 6.2 (L) 6.5 - 8.1 g/dL   Albumin <7.8 (L) 3.5 - 5.0 g/dL   AST 295 (H) 15 - 41 U/L   ALT 59 (H) 0 - 44 U/L   Alkaline Phosphatase 160 (H) 38 - 126 U/L   Total Bilirubin 7.1 (H) 0.3 - 1.2 mg/dL   GFR, Estimated 57 (L) >60 mL/min   Anion gap 7 5 - 15  CBC     Status: Abnormal   Collection Time: 12/28/22  3:43 AM  Result Value Ref Range   WBC 11.2 (H) 4.0 - 10.5 K/uL   RBC 2.46 (L) 3.87 - 5.11 MIL/uL   Hemoglobin 9.8 (L) 12.0 - 15.0 g/dL   HCT 62.1 (L) 30.8 - 65.7 %   MCV 117.5 (H) 80.0 - 100.0 fL   MCH 39.8 (H) 26.0 - 34.0 pg   MCHC 33.9 30.0 - 36.0 g/dL   RDW 84.6 (H) 96.2 - 95.2 %   Platelets 93 (L) 150 - 400 K/uL   nRBC 0.2 0.0 - 0.2 %  Ethanol     Status: None   Collection Time: 12/28/22  3:43 AM  Result Value Ref Range   Alcohol, Ethyl (B) <10 <10 mg/dL  Magnesium     Status: None   Collection Time: 12/28/22  3:43 AM  Result Value Ref Range   Magnesium 1.7 1.7 - 2.4 mg/dL  Phosphorus     Status: Abnormal   Collection Time: 12/28/22  3:43 AM  Result Value Ref Range   Phosphorus 1.6 (L) 2.5 - 4.6 mg/dL  Glucose, capillary     Status: Abnormal   Collection Time: 12/28/22  8:51 AM  Result Value Ref Range   Glucose-Capillary 104 (H) 70 - 99 mg/dL  Glucose, capillary     Status: None   Collection Time: 12/28/22 11:26 AM  Result Value Ref Range   Glucose-Capillary 75 70 - 99 mg/dL    I  have reviewed pertinent nursing notes, vitals, labs, and images as necessary. I have ordered labwork to follow up on as indicated.  I have reviewed the last notes from staff over past 24 hours. I have discussed patient's care plan and test results with nursing staff, CM/SW, and other staff as appropriate.  Time spent: Greater than 50% of the 55 minute visit was spent in counseling/coordination of care for the patient as laid out in the A&P.   LOS: 1 day    Lewie Chamber, MD [facility] 12/28/2022, 1:27 PM

## 2022-12-28 NOTE — Assessment & Plan Note (Signed)
-  Baseline creatinine around 1 -Creatinine 1.41 on admission; etiology suspected volume depletion/prerenal -Improved with fluids - Given severe hypoalbuminemia, discontinue fluids at this time to prevent further third spacing -Continue pleasure feeding and comfort care

## 2022-12-28 NOTE — TOC Progression Note (Signed)
Transition of Care [facility]) - Progression Note   Patient Details  Name: Connie [name] MRN: [id number] Date of Birth: May 24, 1955  Transition of Care [facility]) CM/SW Contact  Ewing Schlein, LCSW Phone Number: 12/28/2022, 1:47 PM  Clinical Narrative:  [facility] consulted for home hospice. CSW spoke with patient's spouse and offered choice. Spouse chose [facility] and confirmed the patient will need DME, including a hospital bed. CSW made hospice referral to [facility] with [facility]. [facility] to coordinate DME delivery with spouse. TOC to follow.  Expected Discharge Plan: Home w Hospice Care Barriers to Discharge: Continued Medical Work up  Expected Discharge Plan and Services In-house Referral: Hospice / Palliative Care Post Acute Care Choice: Hospice Living arrangements for the past 2 months: Single Family Home          DME Arranged: Other see comment ([facility] to set up DME.) DME Agency: Other - Comment Freight forwarder)  Social Determinants of Health (SDOH) Interventions SDOH Screenings   Food Insecurity: No Food Insecurity (01/19/2022)   Received from [facility]  Transportation Needs: No Transportation Needs (01/19/2022)   Received from [facility]  Utilities: Not At Risk (01/19/2022)   Received from [facility]  Financial Resource Strain: Low Risk  (01/19/2022)   [facility]  Tobacco Use: High Risk (12/01/2022)   Readmission Risk Interventions     No data to display

## 2022-12-28 NOTE — Hospital Course (Signed)
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  She underwent lab work and was found to have worsening liver function with uptrending total bili.  She has also had decreased performance status and cancer progression.  She was recommended for transitioning to hospice and treatments have been discontinued. She was referred to the hospital for assistance with transitioning to hospice at home.

## 2022-12-28 NOTE — Assessment & Plan Note (Signed)
-   stage IV with diffuse and worsening liver mets; follows with Dr. Mosetta Putt - worsening functional status and progression of cancer now that oncology has discontinued treatment and patient recommended for transition to hospice at home - [facility] consulted for assistance with arranging home hospice and DME - palliative consult cancelled after discussion with husband on 8/29; goals are clear and he is comfortable with transitioning to home hospice (declines [facility] at this time) - continue comfort care as goal; meds de-escalated

## 2022-12-28 NOTE — Assessment & Plan Note (Signed)
-   multifactorial from volume depletion/3rd spacing/hypoalbuminemai and cancer progression; low suspicion for infection - BP stable at this time; d/c IVF

## 2022-12-28 NOTE — Progress Notes (Signed)
Dr. Frederick Peers notified of CBG 52. 25g D50 given.

## 2022-12-28 NOTE — Progress Notes (Signed)
[facility] Liaison RN note  Received request from [facility] for hospice services at home after discharge. Chart and patient information under review by Hospice physician.   Spoke with Garnetta Buddy to initiate education related to hospice philosophy, services, and team approach to care. Patient/family verbalized understanding of information given.   Per discussion, the plan is for discharge home by EMS likely Friday or Saturday.  DME needs discussed. Patient has the following equipment in the home. Walker   Patient/family requests the following equipment for deliver: hospital bed, bedside table and BSC, and will be ready for this equipment in 24 hours.  Address has been verified and is correct in the chart. Mr. [name] is the family contact to arrange time of equipment delivery.   Please send signed and completed DNR with patient/family. Please provide prescriptions at discharge as needed for ongoing symptom management.   [facility] information and contact numbers given to patient and husband.  Above information shared with [facility]. Please call with any hospice related questions or concerns.  Thank you for the opportunity to participate in this patient's care.  Thea Gist, Charity fundraiser, BSN [employer] [phone]

## 2022-12-28 NOTE — Progress Notes (Signed)
Repeat CBG 123.

## 2022-12-28 NOTE — Plan of Care (Signed)
  Problem: Education: Goal: Knowledge of General Education information will improve Description: Including pain rating scale, medication(s)/side effects and non-pharmacologic comfort measures Outcome: Progressing   Problem: Health Behavior/Discharge Planning: Goal: Ability to manage health-related needs will improve Outcome: Progressing   Problem: Clinical Measurements: Goal: Diagnostic test results will improve Outcome: Progressing Goal: Respiratory complications will improve Outcome: Progressing Goal: Cardiovascular complication will be avoided Outcome: Progressing   Problem: Activity: Goal: Risk for activity intolerance will decrease Outcome: Progressing   Problem: Nutrition: Goal: Adequate nutrition will be maintained Outcome: Progressing   Problem: Elimination: Goal: Will not experience complications related to bowel motility Outcome: Progressing Goal: Will not experience complications related to urinary retention Outcome: Progressing   Problem: Safety: Goal: Ability to remain free from injury will improve Outcome: Progressing   Problem: Skin Integrity: Goal: Risk for impaired skin integrity will decrease Outcome: Progressing

## 2022-12-29 ENCOUNTER — Other Ambulatory Visit: Payer: Self-pay

## 2022-12-29 DIAGNOSIS — C220 Liver cell carcinoma: Secondary | ICD-10-CM | POA: Diagnosis not present

## 2022-12-29 LAB — GLUCOSE, CAPILLARY
Glucose-Capillary: 80 mg/dL (ref 70–99)
Glucose-Capillary: 84 mg/dL (ref 70–99)
Glucose-Capillary: 89 mg/dL (ref 70–99)

## 2022-12-29 MED ORDER — LORAZEPAM 2 MG/ML IJ SOLN
0.5000 mg | Freq: Once | INTRAMUSCULAR | Status: AC
  Administered 2022-12-29: 0.5 mg via INTRAVENOUS
  Filled 2022-12-29: qty 1

## 2022-12-29 MED ORDER — LORAZEPAM 1 MG PO TABS: 1.0000 mg | ORAL_TABLET | ORAL | 0 refills | Status: DC | PRN

## 2022-12-29 MED ORDER — HEPARIN SOD (PORK) LOCK FLUSH 100 UNIT/ML IV SOLN
500.0000 [IU] | INTRAVENOUS | Status: AC | PRN
  Administered 2022-12-29: 500 [IU]
  Filled 2022-12-29: qty 5

## 2022-12-29 NOTE — Discharge Summary (Signed)
Physician Discharge Summary   Connie [name] ZOX:[id number] DOB: 1956-03-07 DOA: 12/27/2022  PCP: Loura Back, NP  Admit date: 12/27/2022 Discharge date: 12/29/2022   Admitted From: Home Disposition:  Home Discharging physician: Lewie Chamber, MD Barriers to discharge: none  Recommendations at discharge: Continue comfort care  Discharge Condition: poor CODE STATUS: DNR Diet recommendation:  Diet Orders (From admission, onward)     Start     Ordered   12/29/22 0000  Diet general        12/29/22 1043   12/27/22 1430  Diet regular Room service appropriate? Yes; Fluid consistency: Thin  Diet effective now       Question Answer Comment  Room service appropriate? Yes   Fluid consistency: Thin      12/27/22 1430            Hospital Course: 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  She underwent lab work and was found to have worsening liver function with uptrending total bili.  She has also had decreased performance status and cancer progression.  She was recommended for transitioning to hospice and treatments have been discontinued. She was referred to the hospital for assistance with transitioning to hospice at home.  Assessment and Plan: * 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) - stage IV with diffuse and worsening liver mets; follows with Dr. Mosetta Putt - worsening functional status and progression of cancer now that oncology has discontinued treatment and patient recommended for transition to hospice at home - [facility] consulted for assistance with arranging home hospice and DME - palliative consult cancelled after discussion with husband on 8/29; goals are clear and he is comfortable with transitioning to home hospice (declines [facility] at this time) - continue comfort care as goal; meds  de-escalated   Hypotension-resolved as of 12/28/2022 - multifactorial from volume depletion/3rd spacing/hypoalbuminemai and cancer progression; low suspicion for infection - BP stable at this time; d/c IVF  AKI (acute kidney injury) (HCC) -Baseline creatinine around 1 -Creatinine 1.41 on admission; etiology suspected volume depletion/prerenal -Improved with fluids - Given severe hypoalbuminemia, discontinue fluids at this time to prevent further third spacing -Continue pleasure feeding and comfort care    Principal Diagnosis: Mixed hepatocellular and bile duct carcinoma [facility])  Discharge Diagnoses: Active Hospital Problems   Diagnosis Date Noted   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) 02/23/2022    Priority: 1.   AKI (acute kidney injury) (HCC) 12/28/2022    Resolved Hospital Problems   Diagnosis Date Noted Date Resolved   Hypotension 12/27/2022 12/28/2022    Priority: 2.     Discharge Instructions     Diet general   Complete by: As directed    Increase activity slowly   Complete by: As directed       Allergies as of 12/29/2022   No Known Allergies      Medication List     STOP taking these medications    acetaminophen 500 MG tablet Commonly known as: TYLENOL   amLODipine 10 MG tablet Commonly known as: NORVASC   furosemide 20 MG tablet Commonly known as: LASIX   gabapentin 100 MG capsule Commonly known as: NEURONTIN   lactulose 10 GM/15ML solution Commonly known as: CHRONULAC   lidocaine-prilocaine cream Commonly known as: EMLA   lipase/protease/amylase 12000-38000 units Cpep capsule Commonly known as: CREON  magnesium oxide 400 (240 Mg) MG tablet Commonly known as: MAG-OX   megestrol 625 MG/5ML suspension Commonly known as: MEGACE ES   ondansetron 8 MG tablet Commonly known as: ZOFRAN   oxyCODONE 5 MG immediate release tablet Commonly known as: Oxy IR/ROXICODONE   pantoprazole 40 MG tablet Commonly known as: PROTONIX    polyethylene glycol 17 g packet Commonly known as: MIRALAX / GLYCOLAX   potassium chloride SA 20 MEQ tablet Commonly known as: KLOR-CON M   prochlorperazine 10 MG tablet Commonly known as: COMPAZINE       TAKE these medications    latanoprost 0.005 % ophthalmic solution Commonly known as: XALATAN Place 1 drop into both eyes at bedtime.   LORazepam 1 MG tablet Commonly known as: ATIVAN Take 1 tablet (1 mg total) by mouth every 4 (four) hours as needed for anxiety.        Follow-up Information     [facility] Follow up.   Specialty: Hospice and Palliative Medicine Why: [facility] will provide hospice services in the home after discharge. Contact information: [address] [place] [address] [phone]               No Known Allergies  Consultations: Oncology   Procedures:   Discharge Exam: BP 116/65 (BP Location: Left Arm)   Pulse (!) 107   Temp 98.4 F (36.9 C) (Oral)   Resp 14   Ht 5\' 5"  (1.651 m)   Wt 110.1 kg   SpO2 100%   BMI 40.39 kg/m  Physical Exam Constitutional:      Comments: Eyes open but nonverbal. Does not follow commands. NAD. Slightly impulsive  HENT:     Head: Normocephalic and atraumatic.     Mouth/Throat:     Mouth: Mucous membranes are dry.  Eyes:     General: Scleral icterus present.     Extraocular Movements: Extraocular movements intact.  Cardiovascular:     Rate and Rhythm: Normal rate and regular rhythm.  Pulmonary:     Effort: Pulmonary effort is normal. No respiratory distress.     Breath sounds: Normal breath sounds. No wheezing.  Abdominal:     General: Bowel sounds are normal. There is no distension.     Palpations: Abdomen is soft.     Tenderness: There is no abdominal tenderness.  Musculoskeletal:     Cervical back: Normal range of motion and neck supple.  Skin:    General: Skin is warm and dry.  Neurological:     Comments: Awake but non interactive       The results of  significant diagnostics from this hospitalization (including imaging, microbiology, ancillary and laboratory) are listed below for reference.   Microbiology: Recent Results (from the past 240 hour(s))  Culture, blood (Routine X 2) w Reflex to ID Panel     Status: None (Preliminary result)   Collection Time: 12/27/22  3:43 PM   Specimen: BLOOD LEFT ARM  Result Value Ref Range Status   Specimen Description   Final    BLOOD LEFT ARM Performed at [facility], [address], [place], [place] [address]    Special Requests   Final    BOTTLES DRAWN AEROBIC AND ANAEROBIC Blood Culture adequate volume Performed at [facility], [address] [place], [place] [address]    Culture   Final    NO GROWTH 2 DAYS Performed at [facility], [address], [place], [place] [address]  Report Status PENDING  Incomplete  MRSA Next Gen by PCR, Nasal     Status: None   Collection Time: 12/27/22  3:44 PM   Specimen: Nasal Mucosa; Nasal Swab  Result Value Ref Range Status   MRSA by PCR Next Gen NOT DETECTED NOT DETECTED Final    Comment: (NOTE) The GeneXpert MRSA Assay (FDA approved for NASAL specimens only), is one component of a comprehensive MRSA colonization surveillance program. It is not intended to diagnose MRSA infection nor to guide or monitor treatment for MRSA infections. Test performance is not FDA approved in patients less than 85 years old. Performed at [facility], [address] [place], [place] [address]   Culture, blood (Routine X 2) w Reflex to ID Panel     Status: None (Preliminary result)   Collection Time: 12/27/22  8:45 PM   Specimen: BLOOD LEFT ARM  Result Value Ref Range Status   Specimen Description   Final    BLOOD LEFT ARM Performed at [facility], [address] [place], [place] [address]    Special Requests   Final    BOTTLES DRAWN AEROBIC AND ANAEROBIC Blood Culture adequate volume Performed at [facility], [address] [place], [place] [address]    Culture   Final    NO GROWTH 2 DAYS Performed at [facility], [address] [place], [place] [address]    Report Status PENDING  Incomplete     Labs: BNP (last 3 results) No results for input(s): "BNP" in the last 8760 hours. Basic Metabolic Panel: Recent Labs  Lab 12/27/22 0828 12/28/22 0343 12/28/22 2257  NA 137 130*  --   K 4.4 3.7  --   CL 105 101  --   CO2 25 22  --   GLUCOSE 86 120* 116*  BUN 15 13  --   CREATININE 1.40* 1.07*  --   CALCIUM 8.4* 8.2*  --   MG 1.7 1.7  --   PHOS  --  1.6*  --    Liver Function Tests: Recent Labs  Lab 12/27/22 0828 12/28/22 0343  AST 132* 150*  ALT 50* 59*  ALKPHOS 164* 160*  BILITOT 5.1* 7.1*  PROT 5.4* 6.2*  ALBUMIN 1.5* <1.5*   No results for input(s): "LIPASE", "AMYLASE" in the last 168 hours. No results for input(s): "AMMONIA" in the last 168 hours. CBC: Recent Labs  Lab 12/27/22 0828 12/28/22 0343  WBC 7.0 11.2*  NEUTROABS 3.2  --   HGB 9.0* 9.8*  HCT 26.6* 28.9*  MCV 117.2* 117.5*  PLT 80* 93*   Cardiac Enzymes: No results for input(s): "CKTOTAL", "CKMB", "CKMBINDEX", "TROPONINI" in the last 168 hours. BNP: Invalid input(s): "POCBNP" CBG: Recent Labs  Lab 12/28/22 2225 12/28/22 2302 12/29/22 0042 12/29/22 0404 12/29/22 0720  GLUCAP 63* 105* 89 80 84   D-Dimer No results for input(s): "DDIMER" in the last 72 hours. Hgb A1c No results for input(s): "HGBA1C" in the last 72 hours. Lipid Profile No results for input(s): "CHOL", "HDL", "LDLCALC", "TRIG", "CHOLHDL", "LDLDIRECT" in the last 72 hours. Thyroid function studies Recent Labs    12/27/22 0828  TSH 4.847*   Anemia work up No results for input(s): "VITAMINB12", "FOLATE", "FERRITIN", "TIBC", "IRON", "RETICCTPCT" in the last 72 hours. Urinalysis    Component Value Date/Time   COLORURINE AMBER (A) 12/27/2022 1846   APPEARANCEUR CLOUDY (A) 12/27/2022 1846    LABSPEC 1.018 12/27/2022 1846   PHURINE  5.0 12/27/2022 1846   GLUCOSEU NEGATIVE 12/27/2022 1846   HGBUR NEGATIVE 12/27/2022 1846   BILIRUBINUR SMALL (A) 12/27/2022 1846   KETONESUR NEGATIVE 12/27/2022 1846   PROTEINUR NEGATIVE 12/27/2022 1846   NITRITE NEGATIVE 12/27/2022 1846   LEUKOCYTESUR NEGATIVE 12/27/2022 1846   Sepsis Labs Recent Labs  Lab 12/27/22 0828 12/28/22 0343  WBC 7.0 11.2*   Microbiology Recent Results (from the past 240 hour(s))  Culture, blood (Routine X 2) w Reflex to ID Panel     Status: None (Preliminary result)   Collection Time: 12/27/22  3:43 PM   Specimen: BLOOD LEFT ARM  Result Value Ref Range Status   Specimen Description   Final    BLOOD LEFT ARM Performed at [facility], [address] [place], [place] [address]    Special Requests   Final    BOTTLES DRAWN AEROBIC AND ANAEROBIC Blood Culture adequate volume Performed at [facility], [address], [place], [place] [address]    Culture   Final    NO GROWTH 2 DAYS Performed at [facility], [address], [place], [place] [address]    Report Status PENDING  Incomplete  MRSA Next Gen by PCR, Nasal     Status: None   Collection Time: 12/27/22  3:44 PM   Specimen: Nasal Mucosa; Nasal Swab  Result Value Ref Range Status   MRSA by PCR Next Gen NOT DETECTED NOT DETECTED Final    Comment: (NOTE) The GeneXpert MRSA Assay (FDA approved for NASAL specimens only), is one component of a comprehensive MRSA colonization surveillance program. It is not intended to diagnose MRSA infection nor to guide or monitor treatment for MRSA infections. Test performance is not FDA approved in patients less than 35 years old. Performed at [facility], [address] [place], [place] [address]   Culture, blood (Routine X 2) w Reflex to ID Panel     Status: None (Preliminary result)   Collection Time: 12/27/22  8:45 PM   Specimen: BLOOD LEFT ARM  Result Value  Ref Range Status   Specimen Description   Final    BLOOD LEFT ARM Performed at [facility], [address] [place], [place] [address]    Special Requests   Final    BOTTLES DRAWN AEROBIC AND ANAEROBIC Blood Culture adequate volume Performed at [facility], [address], [place], [place] [address]    Culture   Final    NO GROWTH 2 DAYS Performed at [facility], [address] [place], [place] [address]    Report Status PENDING  Incomplete    Procedures/Studies: DG CHEST PORT 1 VIEW  Result Date: 12/27/2022 CLINICAL DATA:  Weakness. History of mixed hepatocellular and bile duct carcinoma EXAM: PORTABLE CHEST 1 VIEW COMPARISON:  X-ray 02/22/2021.  CT 03/09/2022, 06/28/2022 FINDINGS: Right IJ chest port with tip at the SVC right atrial junction. Borderline size heart. No consolidation, pneumothorax or effusion. No edema. Film is under penetrated. Overlapping cardiac leads. IMPRESSION: Chest port. Borderline size heart. No acute cardiopulmonary disease. Electronically Signed   By: Karen Kays M.D.   On: 12/27/2022 16:31   CT HEAD WO CONTRAST ( )  Result Date: 12/01/2022 CLINICAL DATA:  Mental status change, unknown cause dizziness, weakness EXAM: CT HEAD WITHOUT CONTRAST TECHNIQUE: Contiguous axial images were obtained from the base of the skull through the vertex without intravenous contrast. RADIATION DOSE REDUCTION: This exam was performed according to the  departmental dose-optimization program which includes automated exposure control, adjustment of the mA and/or kV according to patient size and/or use of iterative reconstruction technique. COMPARISON:  None Available. FINDINGS: Brain: No acute intracranial abnormality. Specifically, no hemorrhage, hydrocephalus, mass lesion, acute infarction, or significant intracranial injury. Vascular: No hyperdense vessel or unexpected calcification. Skull: No acute calvarial abnormality. Sinuses/Orbits: No acute  findings Other: None IMPRESSION: No acute intracranial abnormality. Electronically Signed   By: Charlett Nose M.D.   On: 12/01/2022 21:31     Time coordinating discharge: Over 30 minutes    Lewie Chamber, MD  [facility] 12/29/2022, 3:07 PM

## 2022-12-29 NOTE — TOC Progression Note (Signed)
Transition of Care [facility]) - Progression Note    Patient Details  Name: Connie [name] MRN: [id number] Date of Birth: [birth year]  Transition of Care [facility]) CM/SW Contact  Adrian Prows, RN Phone Number: 12/29/2022, 9:08 AM  Clinical Narrative:    Juleen Starr, hospital liaison for [facility] regarding dme delivery; she says when agency spoke w/ pt's Thursday PM, he told them he will need 24 hrs to get apt ready for equipment; Misty will follow up and update this RNCM.   Expected Discharge Plan: Home w Hospice Care Barriers to Discharge: Continued Medical Work up  Expected Discharge Plan and Services In-house Referral: Hospice / Palliative Care   Post Acute Care Choice: Hospice Living arrangements for the past 2 months: Single Family Home                 DME Arranged: Other see comment ([facility] to set up DME.) DME Agency: Other - Comment Freight forwarder)                   Social Determinants of Health (SDOH) Interventions SDOH Screenings   Food Insecurity: No Food Insecurity (01/19/2022)   Received from [facility], [facility]  Transportation Needs: No Transportation Needs (01/19/2022)   Received [facility]  Utilities: Not At Risk (01/19/2022)   [facility]  Financial Resource Strain: Low Risk  (01/19/2022)   [facility]  Tobacco Use: High Risk (12/01/2022)    Readmission Risk Interventions     No data to display

## 2022-12-29 NOTE — TOC Transition Note (Signed)
Transition of Care [facility]) - CM/SW Discharge Note   Patient Details  Name: Connie [name] MRN: [id number] Date of Birth: 08-27-1955  Transition of Care [facility]) CM/SW Contact:  Adrian Prows, RN Phone Number: 12/29/2022, 12:09 PM   Clinical Narrative:    D/C orders received; spoke w/ pt's husband [facility] and he says hospital bed has been delivered; d/c address verified [address], [place], [address]; he also gave POC, pt's dtr Duwayne Heck [phone]; [facility] called at 1211; spoke w/ operator [phone]; no TOC needs.   Final next level of care: Home w Hospice Care Barriers to Discharge: No Barriers Identified   Patient Goals and CMS Choice CMS Medicare.gov Compare Post Acute Care list provided to:: Patient Represenative (must comment) Choice offered to / list presented to : Spouse  Discharge Placement                  Patient to be transferred to facility by: Ambulance Name of family member notified: Jolaine Click Patient and family notified of of transfer: 12/29/22  Discharge Plan and Services Additional resources added to the After Visit Summary for   In-house Referral: Hospice / Palliative Care   Post Acute Care Choice: Hospice          DME Arranged: Other see comment ([facility] to set up DME.) DME Agency: Other - Comment Freight forwarder)                  Social Determinants of Health (SDOH) Interventions SDOH Screenings   Food Insecurity: No Food Insecurity (01/19/2022)   [facility]  Transportation Needs: No Transportation Needs (01/19/2022)   [facility]  Utilities: Not At Risk (01/19/2022)   [facility], [facility]  Financial Resource Strain: Low Risk  (01/19/2022)   [facility]  Tobacco Use: High Risk (12/01/2022)     Readmission Risk Interventions     No data to display

## 2022-12-29 NOTE — Progress Notes (Signed)
Connie [name]   DOB:1955/05/23   WJ#:[id number]   AOZ#:[id number]  MED/ONC FOLLOW UP   Subjective: Pt is well-known to me, under my care for her liver cancer.  She was admitted from my office for hypertension and rapidly declining 2 days ago.  She is lethargic, laying in bed.  Appears to be comfortable.  I talked to her daughter at the bedside.   Objective:  Vitals:   12/28/22 1520 12/29/22 0420  BP: 101/65 116/65  Pulse: 97 (!) 107  Resp: (!) 8 14  Temp: 98.2 F (36.8 C) 98.4 F (36.9 C)  SpO2: 97% 100%    Body mass index is 40.39 kg/m.  Intake/Output Summary (Last 24 hours) at 12/29/2022 0947 Last data filed at 12/28/2022 2238 Gross per 24 hour  Intake 262.5 ml  Output --  Net 262.5 ml     Sclerae icteric  Abdomen soft and distended   MSK no focal spinal tenderness, (+) leg edema  Neuro nonfocal    CBG (last 3)  Recent Labs    12/29/22 0042 12/29/22 0404 12/29/22 0720  GLUCAP 89 80 84     Labs:   Urine Studies No results for input(s): "UHGB", "CRYS" in the last 72 hours.  Invalid input(s): "UACOL", "UAPR", "USPG", "UPH", "UTP", "UGL", "UKET", "UBIL", "UNIT", "UROB", "ULEU", "UEPI", "UWBC", "URBC", "UBAC", "CAST", "UCOM", "BILUA"  Basic Metabolic Panel: Recent Labs  Lab 12/27/22 0828 12/28/22 0343 12/28/22 2257  NA 137 130*  --   K 4.4 3.7  --   CL 105 101  --   CO2 25 22  --   GLUCOSE 86 120* 116*  BUN 15 13  --   CREATININE 1.40* 1.07*  --   CALCIUM 8.4* 8.2*  --   MG 1.7 1.7  --   PHOS  --  1.6*  --    GFR Estimated Creatinine Clearance: 63 mL/min (A) (by C-G formula based on SCr of 1.07 mg/dL (H)). Liver Function Tests: Recent Labs  Lab 12/27/22 0828 12/28/22 0343  AST 132* 150*  ALT 50* 59*  ALKPHOS 164* 160*  BILITOT 5.1* 7.1*  PROT 5.4* 6.2*  ALBUMIN 1.5* <1.5*   No results for input(s): "LIPASE", "AMYLASE" in the last 168 hours. No results for input(s): "AMMONIA" in the last 168 hours. Coagulation profile No results for  input(s): "INR", "PROTIME" in the last 168 hours.  CBC: Recent Labs  Lab 12/27/22 0828 12/28/22 0343  WBC 7.0 11.2*  NEUTROABS 3.2  --   HGB 9.0* 9.8*  HCT 26.6* 28.9*  MCV 117.2* 117.5*  PLT 80* 93*   Cardiac Enzymes: No results for input(s): "CKTOTAL", "CKMB", "CKMBINDEX", "TROPONINI" in the last 168 hours. BNP: Invalid input(s): "POCBNP" CBG: Recent Labs  Lab 12/28/22 2225 12/28/22 2302 12/29/22 0042 12/29/22 0404 12/29/22 0720  GLUCAP 63* 105* 89 80 84   D-Dimer No results for input(s): "DDIMER" in the last 72 hours. Hgb A1c No results for input(s): "HGBA1C" in the last 72 hours. Lipid Profile No results for input(s): "CHOL", "HDL", "LDLCALC", "TRIG", "CHOLHDL", "LDLDIRECT" in the last 72 hours. Thyroid function studies Recent Labs    12/27/22 0828  TSH 4.847*   Anemia work up No results for input(s): "VITAMINB12", "FOLATE", "FERRITIN", "TIBC", "IRON", "RETICCTPCT" in the last 72 hours. Microbiology Recent Results (from the past 240 hour(s))  Culture, blood (Routine X 2) w Reflex to ID Panel     Status: None (Preliminary result)   Collection Time: 12/27/22  3:43 PM   Specimen: BLOOD  LEFT ARM  Result Value Ref Range Status   Specimen Description   Final    BLOOD LEFT ARM Performed at [facility], [address], [place], [place] [address]    Special Requests   Final    BOTTLES DRAWN AEROBIC AND ANAEROBIC Blood Culture adequate volume Performed at [facility], [address] [place], [place] [address]    Culture   Final    NO GROWTH 2 DAYS Performed at [facility], [address] [place], [place] [address]    Report Status PENDING  Incomplete  MRSA Next Gen by PCR, Nasal     Status: None   Collection Time: 12/27/22  3:44 PM   Specimen: Nasal Mucosa; Nasal Swab  Result Value Ref Range Status   MRSA by PCR Next Gen NOT DETECTED NOT DETECTED Final    Comment: (NOTE) The GeneXpert MRSA Assay (FDA approved for NASAL  specimens only), is one component of a comprehensive MRSA colonization surveillance program. It is not intended to diagnose MRSA infection nor to guide or monitor treatment for MRSA infections. Test performance is not FDA approved in patients less than 58 years old. Performed at [facility], [address] [place], [place] [address]   Culture, blood (Routine X 2) w Reflex to ID Panel     Status: None (Preliminary result)   Collection Time: 12/27/22  8:45 PM   Specimen: BLOOD LEFT ARM  Result Value Ref Range Status   Specimen Description   Final    BLOOD LEFT ARM Performed at [facility], [address], [place], [place] [address]    Special Requests   Final    BOTTLES DRAWN AEROBIC AND ANAEROBIC Blood Culture adequate volume Performed at [facility], [address] [place], [place] [address]    Culture   Final    NO GROWTH 2 DAYS Performed at [facility], [address], [place], [place] [address]    Report Status PENDING  Incomplete      Studies:  DG CHEST PORT 1 VIEW  Result Date: 12/27/2022 CLINICAL DATA:  Weakness. History of mixed hepatocellular and bile duct carcinoma EXAM: PORTABLE CHEST 1 VIEW COMPARISON:  X-ray 02/22/2021.  CT 03/09/2022, 06/28/2022 FINDINGS: Right IJ chest port with tip at the SVC right atrial junction. Borderline size heart. No consolidation, pneumothorax or effusion. No edema. Film is under penetrated. Overlapping cardiac leads. IMPRESSION: Chest port. Borderline size heart. No acute cardiopulmonary disease. Electronically Signed   By: Karen Kays M.D.   On: 12/27/2022 16:31    Assessment: 67 y.o. 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  We have recommended hospice and patient's family agrees with the plan.  They have met hospice team, and the plan to go home with hospice later today or tomorrow.  I spoke with her daughter at the bedside, reviewed what to anticipate, the role of hospice team and as for her end-of-life care.  Questions were answered.  I appreciate the excellent care from our hospitalist and palliative care teams.  I will be happy to her hospice attending if needed.    Malachy Mood, MD 12/29/2022  9:47 AM

## 2022-12-29 NOTE — Plan of Care (Signed)
  Problem: Safety: Goal: Ability to remain free from injury will improve Outcome: Progressing   Problem: Pain Management: Goal: Satisfaction with pain management regimen will improve 12/29/2022 0421 by Damita Lack, RN Outcome: Progressing 12/29/2022 0421 by Damita Lack, RN Outcome: Progressing   Problem: Role Relationship: Goal: Family's ability to cope with current situation will improve 12/29/2022 0421 by Damita Lack, RN Outcome: Progressing 12/29/2022 0421 by Damita Lack, RN Outcome: Progressing Goal: Ability to verbalize concerns, feelings, and thoughts to partner or family member will improve 12/29/2022 0421 by Damita Lack, RN Outcome: Progressing 12/29/2022 0421 by Damita Lack, RN Outcome: Progressing

## 2022-12-30 NOTE — Plan of Care (Signed)
Contacted this morning about GPR in blood culture and again this afternoon around 5pm that there was an issue with the ativan script sent to her pharmacy. Husband had called initially at ~12pm however regarding the ativan.  I called [facility] and fixed the issue (insurance won't cover q4prn will cover q6prn).  Then called [facility] and told him script should be fixed and he can pick it up. Also reviewed the blood culture with him and in setting of comfort care on hospice at home, we would forego addressing anything further on the potential bacteremia; he was okay with this as well.   Hospice plans to come to house on 9/1 for assessment and should hopefully be able to take over at that point as well.   Lewie Chamber, MD [facility] 12/30/2022, 5:43 PM

## 2023-01-01 LAB — CULTURE, BLOOD (ROUTINE X 2)
Culture: NO GROWTH
Special Requests: ADEQUATE

## 2023-01-03 LAB — MISC LABCORP TEST (SEND OUT): Labcorp test code: 8664

## 2023-01-10 ENCOUNTER — Inpatient Hospital Stay: Payer: 59

## 2023-01-10 ENCOUNTER — Inpatient Hospital Stay: Payer: 59 | Admitting: Hematology

## 2023-01-10 LAB — ORGANISM ID, BACTERIA

## 2023-01-10 LAB — BACTERIAL ORGANISM REFLEX

## 2023-01-16 LAB — CULTURE, BLOOD (ROUTINE X 2): Special Requests: ADEQUATE

## 2023-01-30 DEATH — deceased
